# Patient Record
Sex: Female | Born: 1960 | Race: White | Hispanic: No | Smoking: Never smoker
Health system: Southern US, Community
[De-identification: ages and names within clinical notes are randomized; demographics above are authoritative.]

## PROBLEM LIST (undated history)

## (undated) DIAGNOSIS — G4733 Obstructive sleep apnea (adult) (pediatric): Secondary | ICD-10-CM

## (undated) DIAGNOSIS — I319 Disease of pericardium, unspecified: Secondary | ICD-10-CM

## (undated) DIAGNOSIS — G43909 Migraine, unspecified, not intractable, without status migrainosus: Secondary | ICD-10-CM

## (undated) DIAGNOSIS — I10 Essential (primary) hypertension: Secondary | ICD-10-CM

## (undated) DIAGNOSIS — D649 Anemia, unspecified: Secondary | ICD-10-CM

## (undated) DIAGNOSIS — Z789 Other specified health status: Secondary | ICD-10-CM

## (undated) DIAGNOSIS — K219 Gastro-esophageal reflux disease without esophagitis: Secondary | ICD-10-CM

## (undated) DIAGNOSIS — N3281 Overactive bladder: Secondary | ICD-10-CM

## (undated) DIAGNOSIS — H3320 Serous retinal detachment, unspecified eye: Secondary | ICD-10-CM

## (undated) DIAGNOSIS — G4700 Insomnia, unspecified: Secondary | ICD-10-CM

## (undated) DIAGNOSIS — C73 Malignant neoplasm of thyroid gland: Secondary | ICD-10-CM

## (undated) DIAGNOSIS — Z8489 Family history of other specified conditions: Secondary | ICD-10-CM

## (undated) DIAGNOSIS — I2510 Atherosclerotic heart disease of native coronary artery without angina pectoris: Secondary | ICD-10-CM

## (undated) DIAGNOSIS — E039 Hypothyroidism, unspecified: Secondary | ICD-10-CM

## (undated) DIAGNOSIS — J40 Bronchitis, not specified as acute or chronic: Secondary | ICD-10-CM

## (undated) DIAGNOSIS — Z923 Personal history of irradiation: Secondary | ICD-10-CM

## (undated) DIAGNOSIS — Z9221 Personal history of antineoplastic chemotherapy: Secondary | ICD-10-CM

## (undated) DIAGNOSIS — E041 Nontoxic single thyroid nodule: Principal | ICD-10-CM

## (undated) DIAGNOSIS — Z9289 Personal history of other medical treatment: Secondary | ICD-10-CM

## (undated) DIAGNOSIS — F339 Major depressive disorder, recurrent, unspecified: Secondary | ICD-10-CM

## (undated) DIAGNOSIS — Z9189 Other specified personal risk factors, not elsewhere classified: Secondary | ICD-10-CM

## (undated) DIAGNOSIS — Z85850 Personal history of malignant neoplasm of thyroid: Secondary | ICD-10-CM

## (undated) DIAGNOSIS — K2970 Gastritis, unspecified, without bleeding: Secondary | ICD-10-CM

## (undated) DIAGNOSIS — I951 Orthostatic hypotension: Secondary | ICD-10-CM

## (undated) DIAGNOSIS — C50919 Malignant neoplasm of unspecified site of unspecified female breast: Secondary | ICD-10-CM

## (undated) HISTORY — DX: Hypothyroidism, unspecified: E03.9

## (undated) HISTORY — DX: Essential (primary) hypertension: I10

## (undated) HISTORY — DX: Orthostatic hypotension: I95.1

## (undated) HISTORY — DX: Personal history of other medical treatment: Z92.89

## (undated) HISTORY — PX: FOOT SURGERY: SHX648

## (undated) HISTORY — DX: Insomnia, unspecified: G47.00

## (undated) HISTORY — DX: Serous retinal detachment, unspecified eye: H33.20

## (undated) HISTORY — DX: Personal history of malignant neoplasm of thyroid: Z85.850

## (undated) HISTORY — DX: Atherosclerotic heart disease of native coronary artery without angina pectoris: I25.10

## (undated) HISTORY — DX: Morbid (severe) obesity due to excess calories: E66.01

## (undated) HISTORY — DX: Malignant neoplasm of thyroid gland: C73

## (undated) HISTORY — DX: Gastro-esophageal reflux disease without esophagitis: K21.9

## (undated) HISTORY — DX: Malignant neoplasm of unspecified site of unspecified female breast: C50.919

## (undated) HISTORY — DX: Obstructive sleep apnea (adult) (pediatric): G47.33

## (undated) HISTORY — DX: Disease of pericardium, unspecified: I31.9

## (undated) HISTORY — DX: Major depressive disorder, recurrent, unspecified: F33.9

## (undated) HISTORY — DX: Overactive bladder: N32.81

## (undated) HISTORY — DX: Migraine, unspecified, not intractable, without status migrainosus: G43.909

## (undated) HISTORY — DX: Other specified health status: Z78.9

## (undated) HISTORY — DX: Gastritis, unspecified, without bleeding: K29.70

## (undated) HISTORY — PX: BACK SURGERY: SHX140

## (undated) HISTORY — DX: Other specified personal risk factors, not elsewhere classified: Z91.89

## (undated) HISTORY — DX: Nontoxic single thyroid nodule: E04.1

## (undated) HISTORY — PX: KNEE SURGERY: SHX244

---

## 1995-01-16 DIAGNOSIS — I319 Disease of pericardium, unspecified: Secondary | ICD-10-CM

## 1995-01-16 DIAGNOSIS — H3320 Serous retinal detachment, unspecified eye: Secondary | ICD-10-CM

## 1995-01-16 HISTORY — DX: Serous retinal detachment, unspecified eye: H33.20

## 1995-01-16 HISTORY — PX: RETINAL DETACHMENT SURGERY: SHX105

## 1995-01-16 HISTORY — DX: Disease of pericardium, unspecified: I31.9

## 2005-04-25 ENCOUNTER — Encounter: Payer: Self-pay | Admitting: Family Medicine

## 2005-05-15 ENCOUNTER — Encounter: Payer: Self-pay | Admitting: Family Medicine

## 2005-06-20 ENCOUNTER — Ambulatory Visit: Payer: Self-pay | Admitting: Neurology

## 2005-07-14 ENCOUNTER — Ambulatory Visit: Payer: Self-pay | Admitting: Neurology

## 2006-02-25 ENCOUNTER — Emergency Department: Payer: Self-pay

## 2006-12-06 ENCOUNTER — Ambulatory Visit: Payer: Self-pay | Admitting: Otolaryngology

## 2006-12-18 ENCOUNTER — Ambulatory Visit: Payer: Self-pay | Admitting: Gastroenterology

## 2006-12-19 ENCOUNTER — Emergency Department: Payer: Self-pay | Admitting: Internal Medicine

## 2008-02-13 ENCOUNTER — Ambulatory Visit: Payer: Self-pay | Admitting: Nephrology

## 2008-03-23 ENCOUNTER — Ambulatory Visit: Payer: Self-pay | Admitting: Obstetrics and Gynecology

## 2010-11-28 ENCOUNTER — Emergency Department: Payer: Self-pay | Admitting: Emergency Medicine

## 2010-12-03 ENCOUNTER — Emergency Department: Payer: Self-pay | Admitting: *Deleted

## 2014-01-15 HISTORY — PX: BREAST LUMPECTOMY: SHX2

## 2014-04-16 ENCOUNTER — Inpatient Hospital Stay
Admit: 2014-04-16 | Disposition: A | Discharge status: Home / Self Care | Payer: Self-pay | Attending: Internal Medicine | Admitting: Internal Medicine

## 2014-04-16 DIAGNOSIS — J40 Bronchitis, not specified as acute or chronic: Secondary | ICD-10-CM

## 2014-04-16 HISTORY — PX: CORONARY ANGIOPLASTY: SHX604

## 2014-04-16 HISTORY — DX: Bronchitis, not specified as acute or chronic: J40

## 2014-04-16 LAB — BASIC METABOLIC PANEL
Anion Gap: 9 (ref 7–16)
BUN: 14 mg/dL
CALCIUM: 9.2 mg/dL
CO2: 29 mmol/L
Chloride: 103 mmol/L
Creatinine: 1.13 mg/dL — ABNORMAL HIGH
EGFR (Non-African Amer.): 56 — ABNORMAL LOW
Glucose: 115 mg/dL — ABNORMAL HIGH
Potassium: 3.6 mmol/L
Sodium: 141 mmol/L

## 2014-04-16 LAB — CBC
HCT: 42.2 % (ref 35.0–47.0)
HGB: 13.3 g/dL (ref 12.0–16.0)
MCH: 26.6 pg (ref 26.0–34.0)
MCHC: 31.5 g/dL — AB (ref 32.0–36.0)
MCV: 85 fL (ref 80–100)
Platelet: 339 10*3/uL (ref 150–440)
RBC: 4.99 10*6/uL (ref 3.80–5.20)
RDW: 15.3 % — AB (ref 11.5–14.5)
WBC: 13.5 10*3/uL — ABNORMAL HIGH (ref 3.6–11.0)

## 2014-04-16 LAB — URINALYSIS, COMPLETE
Bacteria: NONE SEEN
Bilirubin,UR: NEGATIVE
Blood: NEGATIVE
GLUCOSE, UR: NEGATIVE mg/dL (ref 0–75)
Hyaline Cast: 3
Ketone: NEGATIVE
NITRITE: NEGATIVE
PROTEIN: NEGATIVE
Ph: 6 (ref 4.5–8.0)
RBC,UR: <1 /HPF (ref 0–5)
Specific Gravity: 1.015 (ref 1.003–1.030)
Squamous Epithelial: 1

## 2014-04-16 LAB — HEPATIC FUNCTION PANEL A (ARMC)
ALBUMIN: 3.9 g/dL
ALK PHOS: 88 U/L
ALT: 27 U/L
AST: 23 U/L
BILIRUBIN INDIRECT: 0.8
Bilirubin, Direct: 0.1 mg/dL
Bilirubin,Total: 0.9 mg/dL
TOTAL PROTEIN: 6.8 g/dL

## 2014-04-16 LAB — TROPONIN I
TROPONIN-I: 0.5 ng/mL — AB
TROPONIN-I: 0.25 ng/mL — AB
Troponin-I: 0.38 ng/mL — ABNORMAL HIGH

## 2014-04-16 LAB — CK-MB
CK-MB: 3.9 ng/mL
CK-MB: 2.7 ng/mL
CK-MB: 2.1 ng/mL

## 2014-04-17 LAB — BASIC METABOLIC PANEL
Anion Gap: 3 — ABNORMAL LOW (ref 7–16)
BUN: 14 mg/dL
CALCIUM: 8.4 mg/dL — AB
CO2: 30 mmol/L
Chloride: 107 mmol/L
Creatinine: 0.92 mg/dL
Glucose: 88 mg/dL
POTASSIUM: 4.1 mmol/L
SODIUM: 140 mmol/L

## 2014-04-17 LAB — LIPID PANEL
CHOLESTEROL: 132 mg/dL
HDL: 37 mg/dL — AB
LDL CHOLESTEROL, CALC: 72 mg/dL
TRIGLYCERIDES: 115 mg/dL
VLDL Cholesterol, Calc: 23 mg/dL

## 2014-04-17 LAB — CBC WITH DIFFERENTIAL/PLATELET
Basophil #: 0.1 10*3/uL (ref 0.0–0.1)
Basophil %: 0.6 %
Eosinophil #: 0.2 10*3/uL (ref 0.0–0.7)
Eosinophil %: 1.9 %
HCT: 36.1 % (ref 35.0–47.0)
HGB: 11.7 g/dL — ABNORMAL LOW (ref 12.0–16.0)
LYMPHS ABS: 3.7 10*3/uL — AB (ref 1.0–3.6)
Lymphocyte %: 32.3 %
MCH: 27.5 pg (ref 26.0–34.0)
MCHC: 32.5 g/dL (ref 32.0–36.0)
MCV: 85 fL (ref 80–100)
MONO ABS: 0.8 x10 3/mm (ref 0.2–0.9)
Monocyte %: 7.5 %
NEUTROS PCT: 57.7 %
Neutrophil #: 6.5 10*3/uL (ref 1.4–6.5)
Platelet: 257 10*3/uL (ref 150–440)
RBC: 4.26 10*6/uL (ref 3.80–5.20)
RDW: 15.1 % — ABNORMAL HIGH (ref 11.5–14.5)
WBC: 11.3 10*3/uL — ABNORMAL HIGH (ref 3.6–11.0)

## 2014-04-17 LAB — HEMOGLOBIN A1C: HEMOGLOBIN A1C: 5.6 %

## 2014-04-19 LAB — CBC
HCT: 37.6 % (ref 35.0–47.0)
HGB: 12.1 g/dL (ref 12.0–16.0)
MCH: 27.5 pg (ref 26.0–34.0)
MCHC: 32.3 g/dL (ref 32.0–36.0)
MCV: 85 fL (ref 80–100)
PLATELETS: 250 10*3/uL (ref 150–440)
RBC: 4.41 10*6/uL (ref 3.80–5.20)
RDW: 15.7 % — AB (ref 11.5–14.5)
WBC: 9 10*3/uL (ref 3.6–11.0)

## 2014-04-19 LAB — BASIC METABOLIC PANEL
Anion Gap: 7 (ref 7–16)
BUN: 12 mg/dL
CALCIUM: 8.9 mg/dL
CHLORIDE: 105 mmol/L
CREATININE: 0.93 mg/dL
Co2: 31 mmol/L
EGFR (African American): >60
Glucose: 94 mg/dL
Potassium: 4.5 mmol/L
Sodium: 143 mmol/L

## 2014-04-21 LAB — CULTURE, BLOOD (SINGLE)

## 2014-04-26 DIAGNOSIS — F339 Major depressive disorder, recurrent, unspecified: Secondary | ICD-10-CM | POA: Insufficient documentation

## 2014-04-26 DIAGNOSIS — R51 Headache: Secondary | ICD-10-CM

## 2014-04-26 DIAGNOSIS — K219 Gastro-esophageal reflux disease without esophagitis: Secondary | ICD-10-CM | POA: Insufficient documentation

## 2014-04-26 DIAGNOSIS — I10 Essential (primary) hypertension: Secondary | ICD-10-CM | POA: Insufficient documentation

## 2014-04-26 DIAGNOSIS — G4733 Obstructive sleep apnea (adult) (pediatric): Secondary | ICD-10-CM | POA: Insufficient documentation

## 2014-04-26 DIAGNOSIS — G8929 Other chronic pain: Secondary | ICD-10-CM | POA: Insufficient documentation

## 2014-04-26 DIAGNOSIS — E039 Hypothyroidism, unspecified: Secondary | ICD-10-CM | POA: Insufficient documentation

## 2014-04-26 DIAGNOSIS — N3281 Overactive bladder: Secondary | ICD-10-CM | POA: Insufficient documentation

## 2014-05-16 NOTE — Consult Note (Signed)
PATIENT NAME:  Robin Howard, Robin Howard MR#:  [id number] DATE OF BIRTH:  1960/06/29  DATE OF CONSULTATION:  04/17/2014  REFERRING PHYSICIAN:   CONSULTING PHYSICIAN:  Dionisio David, MD  INDICATION FOR CONSULTATION: Elevated troponin and chest pain.   HISTORY OF PRESENT ILLNESS: This is a 54 year old white female with a past medical history of hypothyroidism, hypertension, who presented to the Emergency Room with chest pain, radiating to both arms and associated with tingling sensation. She had elevated troponin; thus, I was asked to evaluate the patient. The patient also has a history of having bronchitis-type of symptoms for the past 2 weeks, with cough productive of brown-colored sputum.   PAST MEDICAL HISTORY: History of hypothyroidism, depression, hypertension, history of obstructive sleep apnea, history of pericarditis in 2007.   SOCIAL HISTORY:  She denies EtOH abuse or smoking.   FAMILY HISTORY: Her father had CHF and ventricular tachycardia. Mother had also, coronary artery disease.   PHYSICAL EXAMINATION: GENERAL: She is alert, oriented x 3, in no acute distress, except for occasional coughing.  VITAL SIGNS: Her temperature is 98.3, pulse 56, respirations 18, blood pressure 139/83, saturation 99. Monitor shows currently, she is in sinus rhythm at a rate of 63 beats per minute.  NECK: No JVD.  LUNGS: Good air entry. No rales or rhonchi.  HEART: Regular rate and rhythm. Normal S1, S2. No audible murmur.  ABDOMEN: Soft, nontender, positive bowel sounds.  EXTREMITIES: No pedal edema.  NEUROLOGIC: She appears to be intact.   LABORATORY DATA: EKG shows normal sinus rhythm, 72 beats per minute, nonspecific ST and T changes in the inferior and lateral leads. Her troponins, initial one was 0.5, then it became 0.25, is trending down. BUN and creatinine 14/1.13, glucose 115.   ASSESSMENT AND PLAN: The patient may be having acute coronary syndrome with non- ST segment elevation myocardial infarction,  with risk factors for coronary artery disease, nonspecific ST and T changes on EKG, mildly elevated troponin, advise cardiac catheterization.   Thank you for very much for the referral     ____________________________ Dionisio David, MD sak:mw D: 04/17/2014 09:55:14 ET T: 04/17/2014 10:03:31 ET JOB#: [id number]  cc: Dionisio David, MD, <Dictator> Dionisio David MD ELECTRONICALLY SIGNED 04/27/2014 13:33

## 2014-05-16 NOTE — H&P (Signed)
PATIENT NAME:  Robin Howard, Robin Howard MR#:  [id number] DATE OF BIRTH:  1961/01/07  DATE OF ADMISSION:  04/16/2014  REFERRING EMERGENCY ROOM PHYSICIAN: Dr. Lisa Roca   PRIMARY CARE PHYSICIAN:  Dr. Jeananne Rama   REASON FOR PRESENTATION:  Cough, congestion, and chest pain.   HISTORY OF PRESENT ILLNESS: This very pleasant 54 year old woman with past medical history of hypothyroidism and hypertension presents today with 2 weeks of dry cough, nausea, dizziness, fatigue and new sensation of chest pain involving both arms. She reports that she has had a cough for about 2 weeks. She went to her primary care physician soon after onset and was started on a Z-Pak and prednisone. Symptoms have not improved since that time. She has a barky loud cough with no sputum production. Coughs a lot after eating and when she lays down. She has had nausea with no vomiting. She has had decreased p.o. intake. She is dizzy when she stands up and has a constant headache. For the past 2 nights when she has gotten up to use the bathroom, she has had noted a burning sensation across her chest and both arms. This occurred again this morning, which prompted her to come to the Emergency Room for further evaluation. She has had some wheezing and shortness of breath with any exertion. No measured fevers at home. On presentation to the Emergency Room, her chest x-ray is clear, her respiratory status is stable but her troponin is found to be elevated at 0.5.  Hospitalist services are asked to admit for non-ST elevation myocardial infarction.   PAST MEDICAL HISTORY: 1. Hypothyroidism.  2. Depression.  3. Hypertension.  4. Obstructive sleep apnea not using CPAP due to financial reasons.  5. History of pericarditis in 2007, etiology of this is unclear.   SOCIAL HISTORY: The patient lives alone. She does not smoke cigarettes, drink alcohol, or use illicit substances. She works at [employer].   FAMILY MEDICAL HISTORY: Positive for coronary artery  disease in her mother and her father. Her father also had congestive heart failure and ventricular tachycardia. No family history of diabetes. Her father also had liposarcoma.   REVIEW OF SYSTEMS:   GENERAL: Positive for fatigue, weakness, subjective chills and fevers. No change in weight.  HEENT: No change in vision or hearing. No pain in the eyes.  She has had some sharp pains in both ears over the past few weeks. No sore throat or difficulty swallowing. She has had some sinus congestion, which is resolving.  RESPIRATORY: Positive for cough. No sputum production. No hemoptysis, wheezing, shortness of breath with exertion. No pleuritic type chest pain.  CARDIOVASCULAR: Positive for chest pain and decreased exercise tolerance. No orthopnea or edema.  Positive for presyncopal feeling an orthostatic type symptoms.  GASTROINTESTINAL: Positive for nausea, decreased intake. No vomiting, diarrhea, or abdominal pain.  MUSCULOSKELETAL: No new pain in the neck, back, shoulders, knees, or hips. No swollen tender joints. No gout.  SKIN: No new rashes, lesions or open wounds.  ENDOCRINE: No polyuria, polydipsia, or hot or cold intolerance.  HEMATOLOGIC: No easy bruising or bleeding. No swollen glands.  NEUROLOGIC: No focal numbness or weakness. No seizure or history of cerebrovascular accident. No confusion. She does have a headache and has a history of cluster headaches.  PSYCHIATRIC: No psychosis, bipolar disorder, or schizophrenia. She does have a history of depression.   HOME MEDICATIONS: 1. Synthroid 100 mcg 1 tablet daily.  2. Citalopram 40 mg 1 tablet daily.  3. Atenolol 50 mg  1 tablet once a day.   ALLERGIES: ULTRAM AND REGLAN WITH UNKNOWN REACTIONS.   PHYSICAL EXAMINATION: VITAL SIGNS: Temperature 98.8, pulse 89, respirations 20, blood pressure is 111/77, oxygenation 100% on room air.  GENERAL: The patient appears fatigued and uncomfortable.  HEENT: Pupils equal, round, and reactive to light.  Conjunctivae are clear. Extraocular motion intact. Oral mucous membranes are dry. Fair dentition. Posterior oropharynx is clear with no exudate, edema, or erythema. Trachea is midline. No cervical lymphadenopathy.   NECK: Obese.  RESPIRATORY: Lungs are clear to auscultation bilaterally with good air movement. No wheezing or rhonchi. She does have a loud dry cough.  CARDIOVASCULAR: Regular rate and rhythm. No murmurs, rubs, or gallops. No peripheral edema. Peripheral pulses 2+.  NECK:   Difficult to assess for JVD  as her neck is obese. No carotid bruits.  ABDOMEN: Soft, nontender, nondistended. No guarding, no rebound. Bowel sounds are normal. No hepatosplenomegaly or mass noted.  SKIN: No rashes, lesions, or open wounds.  MUSCULOSKELETAL: Range of motion is normal. Strength is 5 out of 5 throughout.  NEUROLOGIC: Cranial nerves II-XII grossly intact. Strength and sensation intact. Nonfocal.  PSYCHIATRIC: She seems depressed or fatigued at this time. She is alert and oriented with good insight into her clinical conditions.   LABORATORY DATA: Sodium 140, potassium 3.6, chloride 103, bicarbonate 29, BUN 14, creatinine 1.13, glucose 115, CK-MB 3.9. Troponin 0.5. White blood cells 13.5, hemoglobin 13.3, platelets 339,000, MCV is 85.   IMAGING: Chest x-ray shows a borderline enlargement of the heart. No edema or consolidation.   ASSESSMENT AND PLAN: 1. Non-ST elevation myocardial infarction.  She has elevated troponin at 0.5. Her EKG shows normal sinus rhythm with nonspecific T wave abnormalities, flipped T waves, flattened T waves in a lateral leads. We will continue to cycle cardiac enzymes and repeat the EKG.  She has been started on aspirin and Lovenox in the Emergency Room and I will continue this. We will start a statin and nitroglycerin p.r.n. will not start a beta blocker at this time as she is slightly hypotensive and bradycardic. A 2-D echocardiogram is ordered. Cardiology consult is ordered.  She is being admitted to 2A.  2. Bronchitis: Chest x-ray is clear. She is in no respiratory distress and oxygen saturations are good. She is not wheezing. We will start nebulizer treatments and Rocephin. We will obtain blood cultures prior to starting antibiotics.  3. Hypotension, she does have orthostatic hypotension; when sitting up, blood pressure drops to 86/40. We will start IV fluids. I suspect that she is depleted due to decreased p.o. intake over the past few days.  4. Acute kidney injury: I do not know her baseline creatinine. I suspect that she has acute tubular necrosis due to hypotension and hypovolemia. We will start IV fluids. We will check a urinalysis. Continue to monitor.  5. Hypertension: She is not hypertensive at this time and I am holding her atenolol due to decreased blood pressure and heart rate.  6. Depression: Continue with citalopram.  7. Hypothyroidism: Check a TSH and continue thyroid replacement. 8. Prophylaxis: Lovenox for treatment of non-ST elevation myocardial infarction.  No gastrointestinal prophylaxis at this time.   CODE STATUS:  PATIENT IS A FULL CODE.   TIME SPENT ON ADMISSION:  45 minutes.    ____________________________ Earleen Newport. Volanda Napoleon, MD cpw:tr D: 04/16/2014 16:26:04 ET T: 04/16/2014 16:46:00 ET JOB#: [id number]  cc: Barnetta Chapel P. Volanda Napoleon, MD, <Dictator> Aldean Jewett MD ELECTRONICALLY SIGNED 04/24/2014 14:38

## 2014-05-16 NOTE — Discharge Summary (Signed)
PATIENT NAMEANTONIQUE, Howard MR#:  [id number] DATE OF BIRTH:  07/03/1960  DATE OF ADMISSION:  04/16/2014 DATE OF DISCHARGE:  04/19/2014  ADMITTING DIAGNOSIS: Chest pain.   DISCHARGING DIAGNOSES:  1. Chest pain.  2. Elevated troponin.  3. Unlikely acute coronary syndrome, likely demand ischemia, status post cardiac catheterization on 04/19/2014 by Dr. Neoma Laming. No coronary artery disease noted, normal ejection fraction.  4. Bronchitis.  5. Acute gastroenteritis, resolving.  6. Bradycardia due to beta blockers.  7. History of hypertension, obstructive sleep apnea, obesity, depression, hypothyroidism.   DISCHARGE CONDITION: Stable.   DISCHARGE MEDICATIONS: The patient is to continue Synthroid 100 mcg p.o. daily, citalopram 40 mg p.o. daily, erythromycin 250 mg p.o. daily for 2 more days, omeprazole 40 mg p.o. daily, Tessalon Perles 100 mg 3 times daily as needed. The patient is not to take atenolol.   HOME OXYGEN: None.   DIET: Two-gram salt, low-fat, low-cholesterol, mechanical soft.   ACTIVITY LIMITATIONS: As tolerated.   FOLLOWUP APPOINTMENTS: Dr. Jeananne Rama in 2 days after discharge, Dr. Neoma Laming in 1 week after discharge.  CONSULTANTS: Care management, social work, Dr. Neoma Laming.   RADIOLOGIC STUDIES: Chest x-ray, portable single view, 04/16/2014, showed heart borderline enlarged, no edema or consolidation. Echocardiogram, 04/16/2014, showed left ventricular ejection fraction by visual estimation 55% to 60%, normal wall motion, elevated mean left atrial pressure, impaired relaxation pattern of left ventricular diastolic filling, normal right ventricular size and systolic function. The left atrium was normal in size and structure. The right atrium was normal in size and structure. Pericardial effusions were noted, globally, trivial pericardial effusion, pulmonic valve was noted to be trileaflet without evidence of stenosis or insufficiency. Cardiac catheterization, 04/19/2014,  showing global left ventricular function normal, ejection fraction calculated by contract ventriculography 60%, normal coronary arteries with normal ejection fraction.   HOSPITAL COURSE: The patient is [name] with past medical history significant for history of hypertension, hypothyroidism, history of obstructive sleep apnea, who presented to the hospital with complaints of cough, congestion, as well as chest pain. Please refer to Dr. Loistine Chance admission note on 04/16/2014. On arrival to the hospital, the patient was complaining of subjective chills as well as fevers. In ER her temperature was 98.8, pulse was 89, respiration rate was 20, blood pressure 111/77, saturation was 100% on room air. Physical exam was unremarkable. The patient's EKG done on admission showed normal sinus rhythm, ST, T changes in inferior as well as  anterolateral leads.   LABORATORY DATA: Done on arrival to the hospital, showed elevated creatinine of 1.13, glucose 115, otherwise BMP was unremarkable. The patient's calcium level was 9.2. Liver enzymes were normal. Cardiac enzymes showed troponin elevation at 0.5 on the 1st set, 0.38 on the 2nd, and 0.25 on the 3rd set. MB fraction was within normal limits. White blood cell count was elevated to 13.5, hemoglobin was 13.3, platelet count was 339,000. Blood cultures taken on 04/16/2014, showed no growth. Urinalysis was remarkable for 1+ leukocyte esterase, less than 1 red blood cells, 5 white blood cells, no bacteria, 1 epithelial cell, mucus was present as well as 3 hyaline casts. Otherwise, negative for blood, protein, and nitrites. The patient's chest x-ray done on arrival to the Emergency Room, single view, showed borderline enlargement of the heart, but otherwise no abnormalities were found. The patient was admitted to the hospital for further evaluation. Her cardiac enzymes were cycled and consultation with Dr. Neoma Laming was obtained. Dr. Humphrey Rolls felt that the  patient  would benefit from cardiac catheterization, and he proceeded to cardiac catheterization on 04/19/2014, which was unremarkable with normal ejection fraction. It was felt that the patient's chest pain, as well as mild elevation of troponin could have been related to demand ischemia, but not acute coronary syndrome. The patient was advised to continue her therapy for bronchitis with erythromycin at 250 mg p.o. daily dose for 2 more days to complete course. She also is to continue [employer] for cough. The patient was complaining of some intermittent nausea as well as pain and discomfort in her abdomen, as well as prior recent diarrhea and epigastric discomfort. The patient was advised to continue omeprazole and follow up with her primary care physician for further recommendations. The patient was noted to be bradycardic with beta blockers and atenolol was stopped. She was rehydrated and her kidney function normalized.   For her chronic medical problems, the patient is to continue her outpatient management.   On the day of discharge, felt somewhat a little bit more comfortable; however, still complaining of some chest discomfort, and she was advised to continue follow up with her primary care physician.   VITAL SIGNS: On the day of discharge: Temperature was 98.1, pulse was 66, respirations were 18, blood pressure 121/77, saturation 96% to 98% on room air at rest.   TIME SPENT: 40 minutes.     ____________________________ Theodoro Grist, MD rv:mw D: 04/19/2014 16:05:46 ET T: 04/19/2014 19:29:06 ET JOB#: [id number]  cc: Theodoro Grist, MD, <Dictator> Dionisio David, MD Unknown cc   Mariafernanda Hendricksen MD ELECTRONICALLY SIGNED 04/25/2014 16:09

## 2014-06-16 ENCOUNTER — Telehealth: Payer: Self-pay | Admitting: Family Medicine

## 2014-06-22 ENCOUNTER — Telehealth: Payer: Self-pay

## 2014-06-22 MED ORDER — OMEPRAZOLE 40 MG PO CPDR: 40.0000 mg | DELAYED_RELEASE_CAPSULE | Freq: Every day | ORAL | Status: DC

## 2014-06-22 NOTE — Telephone Encounter (Signed)
Requesting a refill on Omeprazole 40mg 

## 2014-07-02 ENCOUNTER — Other Ambulatory Visit: Payer: Self-pay | Admitting: Family Medicine

## 2014-07-15 ENCOUNTER — Encounter: Payer: Self-pay | Admitting: Family Medicine

## 2014-07-15 ENCOUNTER — Ambulatory Visit (INDEPENDENT_AMBULATORY_CARE_PROVIDER_SITE_OTHER): Payer: Medicaid Other | Admitting: Family Medicine

## 2014-07-15 VITALS — BP 146/99 | HR 80 | Temp 99.4°F | Wt 240.0 lb

## 2014-07-15 DIAGNOSIS — E669 Obesity, unspecified: Secondary | ICD-10-CM

## 2014-07-15 DIAGNOSIS — R5383 Other fatigue: Secondary | ICD-10-CM | POA: Insufficient documentation

## 2014-07-15 DIAGNOSIS — R5382 Chronic fatigue, unspecified: Secondary | ICD-10-CM

## 2014-07-15 DIAGNOSIS — K219 Gastro-esophageal reflux disease without esophagitis: Secondary | ICD-10-CM

## 2014-07-15 DIAGNOSIS — F331 Major depressive disorder, recurrent, moderate: Secondary | ICD-10-CM

## 2014-07-15 DIAGNOSIS — Z1331 Encounter for screening for depression: Secondary | ICD-10-CM

## 2014-07-15 DIAGNOSIS — G4733 Obstructive sleep apnea (adult) (pediatric): Secondary | ICD-10-CM

## 2014-07-15 DIAGNOSIS — Z1389 Encounter for screening for other disorder: Secondary | ICD-10-CM

## 2014-07-15 DIAGNOSIS — G43709 Chronic migraine without aura, not intractable, without status migrainosus: Secondary | ICD-10-CM

## 2014-07-15 DIAGNOSIS — G43909 Migraine, unspecified, not intractable, without status migrainosus: Secondary | ICD-10-CM | POA: Insufficient documentation

## 2014-07-15 DIAGNOSIS — E039 Hypothyroidism, unspecified: Secondary | ICD-10-CM

## 2014-07-15 DIAGNOSIS — I10 Essential (primary) hypertension: Secondary | ICD-10-CM

## 2014-07-15 MED ORDER — ESCITALOPRAM OXALATE 20 MG PO TABS: 20.0000 mg | ORAL_TABLET | Freq: Every day | ORAL | Status: DC

## 2014-07-15 NOTE — Assessment & Plan Note (Signed)
Not quite to goal, but she is having headache today

## 2014-07-15 NOTE — Assessment & Plan Note (Signed)
Really stressed the need to get back on CPAP; offered referral for sleep study

## 2014-07-15 NOTE — Patient Instructions (Addendum)
Do contact [facility] to see if you can get established Do not take triptans for your migraines Let's get labs today and see if we need to adjust your thyroid medicine Take 1,000 iu of over-the-counter vitamin D3 and 1000 mcg of over-the-counter vitaming B12 I would recommend that you start a walking program, even if you just walk 5-7 minutes at a time to start with four days a week, and then gradually increase to 7-10 minutes and then to 10-12 minutes, etc. I really do recommend the CPAP machine because that will help your fatigue, mood, blood pressure Check GoodRx and bring in the list with you or see if Inderal, verapamil, cardizem, or other medicines are listed Stop the citalopram and start escitalopram   Migraine Headache A migraine headache is very bad, throbbing pain on one or both sides of your head. Talk to your doctor about what things may bring on (trigger) your migraine headaches. HOME CARE  Only take medicines as told by your doctor.  Lie down in a dark, quiet room when you have a migraine.  Keep a journal to find out if certain things bring on migraine headaches. For example, write down:  What you eat and drink.  How much sleep you get.  Any change to your diet or medicines.  Lessen how much alcohol you drink.  Quit smoking if you smoke.  Get enough sleep.  Lessen any stress in your life.  Keep lights dim if bright lights bother you or make your migraines worse. GET HELP RIGHT AWAY IF:   Your migraine becomes really bad.  You have a fever.  You have a stiff neck.  You have trouble seeing.  Your muscles are weak, or you lose muscle control.  You lose your balance or have trouble walking.  You feel like you will pass out (faint), or you pass out.  You have really bad symptoms that are different than your first symptoms. MAKE SURE YOU:   Understand these instructions.  Will watch your condition.  Will get help right away if you are not doing  well or get worse. Document Released: 10/11/2007 Document Revised: 03/26/2011 Document Reviewed: 09/08/2012 [facility] Patient Information 2015 [place], [place]. This information is not intended to replace advice given to you by your health care provider. Make sure you discuss any questions you have with your health care provider. DASH Eating Plan DASH stands for "Dietary Approaches to Stop Hypertension." The DASH eating plan is a healthy eating plan that has been shown to reduce high blood pressure (hypertension). Additional health benefits may include reducing the risk of type 2 diabetes mellitus, heart disease, and stroke. The DASH eating plan may also help with weight loss. WHAT DO I NEED TO KNOW ABOUT THE DASH EATING PLAN? For the DASH eating plan, you will follow these general guidelines:  Choose foods with a percent daily value for sodium of less than 5% (as listed on the food label).  Use salt-free seasonings or herbs instead of table salt or sea salt.  Check with your health care provider or pharmacist before using salt substitutes.  Eat lower-sodium products, often labeled as "lower sodium" or "no salt added."  Eat fresh foods.  Eat more vegetables, fruits, and low-fat dairy products.  Choose whole grains. Look for the word "whole" as the first word in the ingredient list.  Choose fish and skinless chicken or [place] more often than red meat. Limit fish, poultry, and meat to 6 oz (170 g) each  day.  Limit sweets, desserts, sugars, and sugary drinks.  Choose heart-healthy fats.  Limit cheese to 1 oz (28 g) per day.  Eat more home-cooked food and less restaurant, buffet, and fast food.  Limit fried foods.  Cook foods using methods other than frying.  Limit canned vegetables. If you do use them, rinse them well to decrease the sodium.  When eating at a restaurant, ask that your food be prepared with less salt, or no salt if possible. WHAT FOODS CAN I EAT? Seek help from a  dietitian for individual calorie needs. Grains Whole grain or whole wheat bread. Brown rice. Whole grain or whole wheat pasta. Quinoa, bulgur, and whole grain cereals. Low-sodium cereals. Corn or whole wheat flour tortillas. Whole grain cornbread. Whole grain crackers. Low-sodium crackers. Vegetables Fresh or frozen vegetables (raw, steamed, roasted, or grilled). Low-sodium or reduced-sodium tomato and vegetable juices. Low-sodium or reduced-sodium tomato sauce and paste. Low-sodium or reduced-sodium canned vegetables.  Fruits All fresh, canned (in natural juice), or frozen fruits. Meat and Other Protein Products Ground beef (85% or leaner), grass-fed beef, or beef trimmed of fat. Skinless chicken or [place]. Ground chicken or [place]. Pork trimmed of fat. All fish and seafood. Eggs. Dried beans, peas, or lentils. Unsalted nuts and seeds. Unsalted canned beans. Dairy Low-fat dairy products, such as skim or 1% milk, 2% or reduced-fat cheeses, low-fat ricotta or cottage cheese, or plain low-fat yogurt. Low-sodium or reduced-sodium cheeses. Fats and Oils Tub margarines without trans fats. Light or reduced-fat mayonnaise and salad dressings (reduced sodium). Avocado. Safflower, olive, or canola oils. Natural peanut or almond butter. Other Unsalted popcorn and pretzels. The items listed above may not be a complete list of recommended foods or beverages. Contact your dietitian for more options. WHAT FOODS ARE NOT RECOMMENDED? Grains White bread. White pasta. White rice. Refined cornbread. Bagels and croissants. Crackers that contain trans fat. Vegetables Creamed or fried vegetables. Vegetables in a cheese sauce. Regular canned vegetables. Regular canned tomato sauce and paste. Regular tomato and vegetable juices. Fruits Dried fruits. Canned fruit in light or heavy syrup. Fruit juice. Meat and Other Protein Products Fatty cuts of meat. Ribs, chicken wings, bacon, sausage, bologna, salami,  chitterlings, fatback, hot dogs, bratwurst, and packaged luncheon meats. Salted nuts and seeds. Canned beans with salt. Dairy Whole or 2% milk, cream, half-and-half, and cream cheese. Whole-fat or sweetened yogurt. Full-fat cheeses or blue cheese. Nondairy creamers and whipped toppings. Processed cheese, cheese spreads, or cheese curds. Condiments Onion and garlic salt, seasoned salt, table salt, and sea salt. Canned and packaged gravies. Worcestershire sauce. Tartar sauce. Barbecue sauce. Teriyaki sauce. Soy sauce, including reduced sodium. Steak sauce. Fish sauce. Oyster sauce. Cocktail sauce. Horseradish. Ketchup and mustard. Meat flavorings and tenderizers. Bouillon cubes. Hot sauce. Tabasco sauce. Marinades. Taco seasonings. Relishes. Fats and Oils Butter, stick margarine, lard, shortening, ghee, and bacon fat. Coconut, palm kernel, or palm oils. Regular salad dressings. Other Pickles and olives. Salted popcorn and pretzels. The items listed above may not be a complete list of foods and beverages to avoid. Contact your dietitian for more information. WHERE CAN I FIND MORE INFORMATION? National Heart, Lung, and Blood Institute: travelstabloid.com Document Released: 12/21/2010 Document Revised: 05/18/2013 Document Reviewed: 11/05/2012 [facility] Patient Information 2015 [place], [place]. This information is not intended to replace advice given to you by your health care provider. Make sure you discuss any questions you have with your health care provider.

## 2014-07-15 NOTE — Progress Notes (Signed)
BP 146/99 mmHg  Pulse 80  Temp(Src) 99.4 F (37.4 C)  Wt 240 lb (108.863 kg)  SpO2 98%   Subjective:    Patient ID: Robin Howard, female    DOB: [birth year], 54 y.o.   MRN: [id number]  HPI: Robin Howard is a 54 y.o. female  Chief Complaint  Patient presents with  . Depression  . Hypothyroidism  . Headache   Since the last time I saw her, she had a heart attack; they stopped her beta-blocker at discharge; she had a cardiac catheterization; left hospital April 6th; she saw Dr. Humphrey Rolls cardiologist in the hospital, but she did not go back for her follow-up with him after her hospital stay; she says Dr. Wynetta Emery told her she didn't need to go back to see the cardiologist; she never got the statin filled; no chest pain; having some indigestion and taking Prilosec; she has had heartburn for years, had endoscopy and was on PPI, got half of it filled; she again denies chest pain; no acid; soreness in the midchest  She says her head started to hurt yesterday; no aura; gets them two times a week; used to go to [facility] and then to [facility]; taking excedrin migraine per package directions; she doesn't have insurance she says when I offered referral back to headache specialist; she has learned to live with them; this is a typical migraine, gets tingling in the hand occasionally, not new; does have nausea at times, but not so nauseated today to need phenergan she says; they were trying all different medicines; tried topamax, did not really help; perfumes and strong odors are triggers  High blood pressure; she has been limiting salt since her heart attack; trying healthier eating habits but not seeing beneifts with weight loss; not many saturated fats; takes plain benadryl for sinuses, that's okay I told her; knows to stay away from decongestants; has OSA  Hypothyroidism; not surgery or radiation; just fatigue; not better on the thyroid medicine; she does not want to get up  in the morning; she feels like she is going to get up; she has sleep apnea and is not wearing her CPAP  She does not have insurance; she applied for Obamacare but couldn't afford the monthly payment so never actually entered the contract; she worked with someone at hospital, got declined Medicaid in April  Relevant past medical, surgical, family and social history reviewed and updated as indicated. Interim medical history since our last visit reviewed. Allergies and medications reviewed and updated.  PHQ score: Depression screen [facility] 2/9 07/15/2014  Decreased Interest 3  Down, Depressed, Hopeless 1  PHQ - 2 Score 4  Altered sleeping 3  Tired, decreased energy 3  Change in appetite 1  Feeling bad or failure about yourself  1  Trouble concentrating 2  Moving slowly or fidgety/restless 1  Suicidal thoughts 1  PHQ-9 Score 16   Review of Systems Per HPI unless specifically indicated above     Objective:    BP 146/99 mmHg  Pulse 80  Temp(Src) 99.4 F (37.4 C)  Wt 240 lb (108.863 kg)  SpO2 98%  Wt Readings from Last 3 Encounters:  07/15/14 240 lb (108.863 kg)  04/26/14 244 lb (110.678 kg)    Physical Exam  Constitutional: She appears well-developed and well-nourished. No distress.  obese  HENT:  Head: Normocephalic and atraumatic.  Eyes: EOM are normal. No scleral icterus.  Neck: No thyromegaly present.  Cardiovascular: Normal rate,  regular rhythm and normal heart sounds.   No murmur heard. Pulmonary/Chest: Effort normal and breath sounds normal. No respiratory distress. She has no wheezes.  Abdominal: Soft. Bowel sounds are normal. She exhibits no distension.  Musculoskeletal: Normal range of motion. She exhibits no edema.  Neurological: She is alert. She exhibits normal muscle tone.  Skin: Skin is warm and dry. She is not diaphoretic. No pallor.  Psychiatric: She has a normal mood and affect. Her behavior is normal. Judgment and thought content normal.       Assessment  & Plan:   Problem List Items Addressed This Visit      Cardiovascular and Mediastinum   HTN (hypertension)    Not quite to goal, but she is having headache today      Migraine - Primary    No triptans; okay to use excedrin migraine per package directs      Relevant Medications   escitalopram (LEXAPRO) 20 MG tablet     Respiratory   OSA (obstructive sleep apnea)    Really stressed the need to get back on CPAP; offered referral for sleep study      Relevant Orders   Nocturnal polysomnography (NPSG)     Digestive   GERD (gastroesophageal reflux disease)     Endocrine   Hypothyroidism    Reviewed hospital records; did not see a TSH drawn during her hospital stay; weight is not coming off despite healthy diet changes; check today and adjust if needed        Other   Major depressive disorder, recurrent    Stop citalopram and start escitalopram; recheck in 4 weeks; call sooner if hypomanic symptoms      Relevant Medications   escitalopram (LEXAPRO) 20 MG tablet   Fatigue    Other Visit Diagnoses    Depression screening        PHQ 9 score was 12    Obesity            Follow up plan: No Follow-up on file.

## 2014-07-15 NOTE — Assessment & Plan Note (Signed)
No triptans; okay to use excedrin migraine per package directs

## 2014-07-15 NOTE — Assessment & Plan Note (Signed)
Reviewed hospital records; did not see a TSH drawn during her hospital stay; weight is not coming off despite healthy diet changes; check today and adjust if needed

## 2014-07-15 NOTE — Assessment & Plan Note (Signed)
Stop citalopram and start escitalopram; recheck in 4 weeks; call sooner if hypomanic symptoms

## 2014-07-16 DIAGNOSIS — C50919 Malignant neoplasm of unspecified site of unspecified female breast: Secondary | ICD-10-CM

## 2014-07-16 HISTORY — PX: BREAST BIOPSY: SHX20

## 2014-07-16 HISTORY — DX: Malignant neoplasm of unspecified site of unspecified female breast: C50.919

## 2014-07-16 LAB — TSH: TSH: 1.92 u[IU]/mL (ref 0.450–4.500)

## 2014-07-22 ENCOUNTER — Ambulatory Visit (INDEPENDENT_AMBULATORY_CARE_PROVIDER_SITE_OTHER): Payer: Medicaid Other

## 2014-07-22 VITALS — BP 136/84 | HR 80

## 2014-07-22 DIAGNOSIS — I10 Essential (primary) hypertension: Secondary | ICD-10-CM

## 2014-07-22 NOTE — Progress Notes (Signed)
Patient came in for BP check. She is doing much better. BP today is 136/84.

## 2014-07-28 ENCOUNTER — Ambulatory Visit: Payer: Self-pay | Attending: Oncology

## 2014-07-28 ENCOUNTER — Ambulatory Visit
Admission: RE | Admit: 2014-07-28 | Discharge: 2014-07-28 | Disposition: A | Discharge status: Home / Self Care | Payer: Self-pay | Source: Ambulatory Visit | Attending: Oncology | Admitting: Oncology

## 2014-07-28 ENCOUNTER — Other Ambulatory Visit: Payer: Self-pay

## 2014-07-28 ENCOUNTER — Other Ambulatory Visit: Payer: Self-pay | Admitting: Oncology

## 2014-07-28 VITALS — BP 130/89 | HR 102 | Temp 96.6°F | Resp 20 | Ht 65.35 in | Wt 239.9 lb

## 2014-07-28 DIAGNOSIS — N63 Unspecified lump in unspecified breast: Secondary | ICD-10-CM

## 2014-07-28 DIAGNOSIS — Z0000 Encounter for general adult medical examination without abnormal findings: Secondary | ICD-10-CM

## 2014-07-28 NOTE — Progress Notes (Addendum)
Subjective:     Patient ID: Robin Howard, female   DOB: 11/27/1960, 54 y.o.   MRN: [id number]  HPI   Review of Systems     Objective:   Physical Exam  Pulmonary/Chest: Right breast exhibits no inverted nipple, no mass, no nipple discharge, no skin change and no tenderness. Left breast exhibits no inverted nipple, no mass, no nipple discharge, no skin change and no tenderness. Breasts are symmetrical.  Genitourinary: No labial fusion. There is no rash, tenderness, lesion or injury on the right labia. There is no rash, tenderness, lesion or injury on the left labia. Uterus is not deviated, not enlarged, not fixed and not tender. Cervix exhibits no motion tenderness, no discharge and no friability. Right adnexum displays no mass, no tenderness and no fullness. Left adnexum displays no mass, no tenderness and no fullness. No erythema, tenderness or bleeding in the vagina. No foreign body around the vagina. No signs of injury around the vagina. No vaginal discharge found.       Assessment:     54 year old patient presents for [facility] clinic visitPatient screened, and meets B[facility]CP eligibility.  Patient does not have insurance, Medicare or Medicaid.  Handout given on Affordable Care Act  .Instructed patient on breast self-exam using teach back method. CBE unremarkable.  Patient's sister was recently diagnosed with breast cancer and is undergoing treatment with chemotherapy.    Plan:     Sent for bilateral screening mammogram.  Specimen collected for pap.

## 2014-07-29 ENCOUNTER — Other Ambulatory Visit: Payer: Self-pay | Admitting: Oncology

## 2014-07-29 ENCOUNTER — Ambulatory Visit
Admission: RE | Admit: 2014-07-29 | Discharge: 2014-07-29 | Disposition: A | Discharge status: Home / Self Care | Payer: Medicaid Other | Source: Ambulatory Visit | Attending: Oncology | Admitting: Oncology

## 2014-07-29 ENCOUNTER — Other Ambulatory Visit: Payer: Self-pay

## 2014-07-29 DIAGNOSIS — N63 Unspecified lump in unspecified breast: Secondary | ICD-10-CM

## 2014-07-30 ENCOUNTER — Ambulatory Visit: Payer: Medicaid Other | Attending: Otolaryngology

## 2014-07-30 DIAGNOSIS — F5101 Primary insomnia: Secondary | ICD-10-CM | POA: Insufficient documentation

## 2014-07-30 DIAGNOSIS — G4733 Obstructive sleep apnea (adult) (pediatric): Secondary | ICD-10-CM | POA: Diagnosis present

## 2014-07-31 LAB — PAP LB AND HPV HIGH-RISK
HPV, high-risk: NEGATIVE
PAP SMEAR COMMENT: 0

## 2014-08-03 ENCOUNTER — Ambulatory Visit
Admission: RE | Admit: 2014-08-03 | Discharge: 2014-08-03 | Disposition: A | Discharge status: Home / Self Care | Payer: Self-pay | Source: Ambulatory Visit | Attending: Oncology | Admitting: Oncology

## 2014-08-03 ENCOUNTER — Other Ambulatory Visit: Payer: Self-pay | Admitting: Oncology

## 2014-08-03 DIAGNOSIS — N63 Unspecified lump in unspecified breast: Secondary | ICD-10-CM

## 2014-08-03 NOTE — Progress Notes (Signed)
Patient ID: Robin Howard, female   DOB: 09/03/1960, 54 y.o.   MRN: [id number] Patient retruned today for additional views right breast, and right breast ultrasound which howed a sebaceous cyst.  Ultrasound guided biopsy was performed on left beast. Phoned patient as follow-up post biospy. States she is doing well.

## 2014-08-04 ENCOUNTER — Other Ambulatory Visit: Payer: Self-pay | Admitting: Family Medicine

## 2014-08-05 ENCOUNTER — Telehealth: Payer: Self-pay

## 2014-08-05 LAB — SURGICAL PATHOLOGY

## 2014-08-05 NOTE — Telephone Encounter (Signed)
Received positive pathology report from Dr. Dicie Beam for invasive mammary carcinoma.  Phoned patient with results.  Her sister is with her for support.  Sister diagnosed recently with breast cancer, and is undergoing chemotherapy.  MYRISK negative results on sister.  Patient is scheduled to see Dr. Grayland Ormond in [facility] 08/10/14.  Will apply for [facility] at that time.  Offered to see patient today, but she wants to wait to come on Tuesday.

## 2014-08-09 ENCOUNTER — Telehealth: Payer: Self-pay

## 2014-08-09 NOTE — Telephone Encounter (Signed)
Patient phoned requesting surgical consult following Breast Clinic visit tomorrow.  Spoke to Dr. Bary Castilla.  He will see patient at 11:30 tomorrow 08/10/14.  Instructed patient to arrive at 11:00 with photo ID, and current medications.

## 2014-08-10 ENCOUNTER — Encounter: Payer: Self-pay | Admitting: Oncology

## 2014-08-10 ENCOUNTER — Inpatient Hospital Stay: Payer: Medicaid Other | Attending: Oncology | Admitting: Oncology

## 2014-08-10 ENCOUNTER — Encounter: Payer: Self-pay | Admitting: General Surgery

## 2014-08-10 ENCOUNTER — Ambulatory Visit (INDEPENDENT_AMBULATORY_CARE_PROVIDER_SITE_OTHER): Payer: Medicaid Other | Admitting: General Surgery

## 2014-08-10 ENCOUNTER — Other Ambulatory Visit: Payer: PRIVATE HEALTH INSURANCE

## 2014-08-10 VITALS — BP 138/72 | HR 76 | Resp 14 | Ht 66.0 in | Wt 234.0 lb

## 2014-08-10 VITALS — BP 147/74 | HR 64 | Temp 97.4°F | Resp 20 | Ht 65.35 in | Wt 242.1 lb

## 2014-08-10 DIAGNOSIS — N63 Unspecified lump in breast: Secondary | ICD-10-CM | POA: Diagnosis not present

## 2014-08-10 DIAGNOSIS — Z7982 Long term (current) use of aspirin: Secondary | ICD-10-CM

## 2014-08-10 DIAGNOSIS — N6320 Unspecified lump in the left breast, unspecified quadrant: Secondary | ICD-10-CM

## 2014-08-10 DIAGNOSIS — E039 Hypothyroidism, unspecified: Secondary | ICD-10-CM | POA: Diagnosis not present

## 2014-08-10 DIAGNOSIS — Z803 Family history of malignant neoplasm of breast: Secondary | ICD-10-CM | POA: Insufficient documentation

## 2014-08-10 DIAGNOSIS — N3281 Overactive bladder: Secondary | ICD-10-CM | POA: Diagnosis not present

## 2014-08-10 DIAGNOSIS — K219 Gastro-esophageal reflux disease without esophagitis: Secondary | ICD-10-CM | POA: Diagnosis not present

## 2014-08-10 DIAGNOSIS — D649 Anemia, unspecified: Secondary | ICD-10-CM

## 2014-08-10 DIAGNOSIS — R0602 Shortness of breath: Secondary | ICD-10-CM | POA: Insufficient documentation

## 2014-08-10 DIAGNOSIS — I10 Essential (primary) hypertension: Secondary | ICD-10-CM | POA: Diagnosis not present

## 2014-08-10 DIAGNOSIS — C50912 Malignant neoplasm of unspecified site of left female breast: Secondary | ICD-10-CM

## 2014-08-10 DIAGNOSIS — F419 Anxiety disorder, unspecified: Secondary | ICD-10-CM | POA: Diagnosis not present

## 2014-08-10 DIAGNOSIS — F329 Major depressive disorder, single episode, unspecified: Secondary | ICD-10-CM | POA: Insufficient documentation

## 2014-08-10 DIAGNOSIS — Z8669 Personal history of other diseases of the nervous system and sense organs: Secondary | ICD-10-CM | POA: Diagnosis not present

## 2014-08-10 DIAGNOSIS — Z79899 Other long term (current) drug therapy: Secondary | ICD-10-CM | POA: Diagnosis not present

## 2014-08-10 DIAGNOSIS — G4700 Insomnia, unspecified: Secondary | ICD-10-CM | POA: Insufficient documentation

## 2014-08-10 DIAGNOSIS — Z171 Estrogen receptor negative status [ER-]: Secondary | ICD-10-CM

## 2014-08-10 DIAGNOSIS — G4730 Sleep apnea, unspecified: Secondary | ICD-10-CM | POA: Insufficient documentation

## 2014-08-10 DIAGNOSIS — I252 Old myocardial infarction: Secondary | ICD-10-CM | POA: Diagnosis not present

## 2014-08-10 DIAGNOSIS — C50512 Malignant neoplasm of lower-outer quadrant of left female breast: Secondary | ICD-10-CM | POA: Insufficient documentation

## 2014-08-10 DIAGNOSIS — I319 Disease of pericardium, unspecified: Secondary | ICD-10-CM | POA: Insufficient documentation

## 2014-08-10 DIAGNOSIS — E669 Obesity, unspecified: Secondary | ICD-10-CM

## 2014-08-10 NOTE — H&P (Addendum)
HPI  Robin Howard is a 54 y.o. female who presents for a breast evaluation. The most recent mammogram was done on 07/29/14 and left breast ultrasound and biopsy on 08/03/14.  The patient's prior mammogram was in 2010.  Patient does not perform regular self breast checks and does not get regular mammograms done. Sister found out two months ago she breast cancer seeing Dr. Burt Howard. Sister had the Genetic testing done and it came back negative.  The patient was evaluated by Robin Howard, M.D. this morning. Results of that consultation not available.  The patient was accompanied today by her son who is in the [employer] reserves, and getting ready to come out for training in [place].  HPI  Past Medical History   Diagnosis  Date   .  Major depressive disorder, recurrent    .  HTN (hypertension)    .  Overactive bladder    .  Migraines    .  Gastritis    .  Hypothyroidism    .  OSA (obstructive sleep apnea)      untreated due to lack of insurance   .  Insomnia    .  GERD (gastroesophageal reflux disease)    .  Pericarditis  1997   .  Detached retina  1997   .  Chronic headache    .  Morbid obesity    .  NSTEMI (non-ST elevated myocardial infarction)  04/16/14     Due to demand- normal Cath 04/19/14   .  Breast cancer     Past Surgical History   Procedure  Laterality  Date   .  Back surgery       L4-5   .  Knee surgery     .  Cesarean section     .  Coronary angioplasty   April 2016    Family History   Problem  Relation  Age of Onset   .  Alcohol abuse  Mother    .  Thyroid disease  Mother    .  Hypertension  Mother    .  COPD  Mother    .  Cancer  Father      liposarcoma   .  Congestive Heart Failure  Father    .  Depression  Father    .  Hypertension  Father    .  Cancer  Maternal Uncle      Pancreatic cancer   .  Heart disease  Paternal Grandfather  39     MI   .  Diabetes  Other    .  Cancer  Sister      breast   .  Breast cancer  Sister  50    Social History  History     Substance Use Topics   .  Smoking status:  Never Smoker   .  Smokeless tobacco:  Never Used   .  Alcohol Use:  No    Allergies   Allergen  Reactions   .  Paxil [Paroxetine Hcl]  Other (See Comments)     Blurred vision   .  Reglan [Metoclopramide]  Other (See Comments)     Dystonic Reaction   .  Ultram [Tramadol]     Current Outpatient Prescriptions   Medication  Sig  Dispense  Refill   .  aspirin EC 81 MG tablet  Take 81 mg by mouth daily.     Marland Kitchen  escitalopram (LEXAPRO) 20 MG tablet  Take 1 tablet (  20 mg total) by mouth daily. Stop the escitalopram  30 tablet  2   .  levothyroxine (SYNTHROID, LEVOTHROID) 100 MCG tablet  Take 1 tablet (100 mcg total) by mouth daily.  30 tablet  11   .  omeprazole (PRILOSEC) 40 MG capsule  Take 1 capsule (40 mg total) by mouth daily.  30 capsule  3    No current facility-administered medications for this visit.    Review of Systems  Review of Systems  Constitutional: Negative.  Respiratory: Negative.  Cardiovascular: Negative.   Blood pressure 138/72, pulse 76, resp. rate 14, height '5\' 6"'  (1.676 m), weight 234 lb (106.142 kg), last menstrual period 07/27/2012.  Physical Exam  Physical Exam  Constitutional: She is oriented to person, place, and time. She appears well-developed and well-nourished.  HENT:  Mouth/Throat: Oropharynx is clear and moist. No oropharyngeal exudate.  Eyes: Conjunctivae are normal. No scleral icterus.  Neck: Neck supple.  Cardiovascular: Normal rate, regular rhythm and normal heart sounds.  Pulmonary/Chest: Effort normal and breath sounds normal. Right breast exhibits no inverted nipple, no mass, no nipple discharge and no skin change. Left breast exhibits no inverted nipple, no mass, no nipple discharge, no skin change and no tenderness.    2 cm firm hematoma at biopsy site left breast  Lymphadenopathy:  She has no cervical adenopathy.  Neurological: She is alert and oriented to person, place, and time.  Skin: Skin  is warm and dry.   Data Reviewed  April 2016 cath report: No coronary disease. Normal echocardiogram.  2016 and 2010 mammograms reviewed.  Pathology from recently completed a left breast biopsy reviewed. ER negative, PR negative, HER-2/neu 3+.   Ultrasound examination was completed to assess visibility of the previously placed biopsy clip. In the left breast at the 5:00 position, 6 cm from the nipple a complex mass consistent with the recently completed biopsy measuring 1.3 x 1.3 x 1.36 cm was identified. This is within 1 cm of the overlying skin. BI-RADS-6.  Assessment   Stage I carcinoma the left breast.   Plan   Options for management were reviewed with the patient and her son. Breast conservation and mastectomy were presented as equivalent procedures. The role for post surgery radiation was reviewed. The patient's sister recently underwent breast conservation under the care of Dr. Burt Howard, and at present that is her desired form treatment. To facilitate scheduling around her sons upcoming departure surgery will be scheduled in an expedited fashion.  Patient is scheduled for surgery at [facility] on 08/13/14. She will report to the radiology desk that day at 10:00 am. She will pre admit by phone on 08/12/14. Patient is aware of dates, time, and all instructions.   PCP: Robin Howard

## 2014-08-10 NOTE — Progress Notes (Signed)
Patient ID: Robin Howard, female   DOB: [birth year], 54 y.o.   MRN: [id number]  Chief Complaint  Patient presents with  . Other    breast cancer    HPI Robin Howard is a 54 y.o. female who presents for a breast evaluation. The most recent mammogram was done on 07/29/14 and left breast ultrasound and biopsy on 08/03/14.  The patient's prior mammogram was in 2010.  Patient does not perform regular self breast checks and does not get  regular mammograms done. Sister found out two months ago she breast cancer seeing Dr. Burt Knack. Sister had the Genetic testing done and it  came back negative.  The patient was evaluated by [name], M.D. this morning. Results of that consultation not available.  The patient was accompanied today by her son who is in the [employer] reserves, and getting ready to come out for training in [place].  HPI  Past Medical History  Diagnosis Date  . Major depressive disorder, recurrent   . HTN (hypertension)   . Overactive bladder   . Migraines   . Gastritis   . Hypothyroidism   . OSA (obstructive sleep apnea)     untreated due to lack of insurance  . Insomnia   . GERD (gastroesophageal reflux disease)   . Pericarditis 1997  . Detached retina 1997  . Chronic headache   . Morbid obesity   . NSTEMI (non-ST elevated myocardial infarction) 04/16/14    Due to demand- normal Cath 04/19/14  . Breast cancer     Past Surgical History  Procedure Laterality Date  . Back surgery      L4-5  . Knee surgery    . Cesarean section    . Coronary angioplasty  April 2016    Family History  Problem Relation Age of Onset  . Alcohol abuse Mother   . Thyroid disease Mother   . Hypertension Mother   . COPD Mother   . Cancer Father     liposarcoma  . Congestive Heart Failure Father   . Depression Father   . Hypertension Father   . Cancer Maternal Uncle     Pancreatic cancer  . Heart disease Paternal Grandfather 79    MI  . Diabetes Other   . Cancer Sister    breast  . Breast cancer Sister 27    Social History History  Substance Use Topics  . Smoking status: Never Smoker   . Smokeless tobacco: Never Used  . Alcohol Use: No    Allergies  Allergen Reactions  . Paxil [Paroxetine Hcl] Other (See Comments)    Blurred vision  . Reglan [Metoclopramide] Other (See Comments)    Dystonic Reaction  . Ultram [Tramadol]     Current Outpatient Prescriptions  Medication Sig Dispense Refill  . aspirin EC 81 MG tablet Take 81 mg by mouth daily.    Marland Kitchen escitalopram (LEXAPRO) 20 MG tablet Take 1 tablet (20 mg total) by mouth daily. Stop the escitalopram 30 tablet 2  . levothyroxine (SYNTHROID, LEVOTHROID) 100 MCG tablet Take 1 tablet (100 mcg total) by mouth daily. 30 tablet 11  . omeprazole (PRILOSEC) 40 MG capsule Take 1 capsule (40 mg total) by mouth daily. 30 capsule 3   No current facility-administered medications for this visit.    Review of Systems Review of Systems  Constitutional: Negative.   Respiratory: Negative.   Cardiovascular: Negative.     Blood pressure 138/72, pulse 76, resp. rate 14, height _0  (1.676 m), weight  234 lb (106.142 kg), last menstrual period 07/27/2012.  Physical Exam Physical Exam  Constitutional: She is oriented to person, place, and time. She appears well-developed and well-nourished.  HENT:  Mouth/Throat: Oropharynx is clear and moist. No oropharyngeal exudate.  Eyes: Conjunctivae are normal. No scleral icterus.  Neck: Neck supple.  Cardiovascular: Normal rate, regular rhythm and normal heart sounds.   Pulmonary/Chest: Effort normal and breath sounds normal. Right breast exhibits no inverted nipple, no mass, no nipple discharge and no skin change. Left breast exhibits no inverted nipple, no mass, no nipple discharge, no skin change and no tenderness.    2 cm firm hematoma at biopsy site left breast   Lymphadenopathy:    She has no cervical adenopathy.  Neurological: She is alert and oriented to  person, place, and time.  Skin: Skin is warm and dry.    Data Reviewed April 2016 cath report: No coronary disease. Normal echocardiogram.  2016 and 2010 mammograms reviewed.  Pathology from recently completed a left breast biopsy reviewed. ER negative, PR negative, HER-2/neu 3+.  Ultrasound examination was completed to assess visibility of the previously placed biopsy clip. In the left breast at the 5:00 position, 6 cm from the nipple a complex mass consistent with the recently completed biopsy measuring 1.3 x 1.3 x 1.36 cm was identified. This is within 1 cm of the overlying skin. BI-RADS-6.  Assessment    Stage I carcinoma the left breast.    Plan    Options for management were reviewed with the patient and her son. Breast conservation and mastectomy were presented as equivalent procedures. The role for post surgery radiation was reviewed. The patient's sister recently underwent breast conservation under the care of Dr. Burt Knack, and at present that is her desired form treatment. To facilitate scheduling around her sons upcoming departure surgery will be scheduled in an expedited fashion. Patient is scheduled for surgery at [facility] on 08/13/14. She will report to the radiology desk that day at 10:00 am. She will pre admit by phone on 08/12/14. Patient is aware of dates, time, and all instructions.      PCP:  Gelene Mink 08/10/2014, 4:30 PM

## 2014-08-10 NOTE — Patient Instructions (Signed)
Patient is scheduled for surgery at [facility] on 08/13/14. She will report to the radiology desk that day at 10:00 am. She will pre admit by phone on 08/12/14. Patient is aware of dates, time, and all instructions.

## 2014-08-10 NOTE — Progress Notes (Signed)
Met patient, and son Marylyn Ishihara at initial Medical Oncology visit.  Gave Breast Cancer Treatment Handbook/folder with hospital services.  Dr. Grayland Ormond would like to see patient one week after her surgery.  She is meeting with Dr. Bary Castilla immediately after this appointment.  BRCA test sent. BCCMedicaid application completed.

## 2014-08-11 ENCOUNTER — Telehealth: Payer: Self-pay

## 2014-08-11 NOTE — Telephone Encounter (Signed)
Patient has surgery with Dr. Bary Castilla this Friday 08/13/14.  When would you like her to f/u with you?

## 2014-08-11 NOTE — Telephone Encounter (Signed)
About 1-2 weeks later.  Thanks.

## 2014-08-12 ENCOUNTER — Encounter
Admission: RE | Admit: 2014-08-12 | Discharge: 2014-08-12 | Disposition: A | Discharge status: Home / Self Care | Payer: Self-pay | Source: Ambulatory Visit | Attending: General Surgery | Admitting: General Surgery

## 2014-08-12 ENCOUNTER — Encounter: Payer: Self-pay | Admitting: *Deleted

## 2014-08-12 ENCOUNTER — Ambulatory Visit: Payer: Self-pay | Admitting: Family Medicine

## 2014-08-12 HISTORY — DX: Anemia, unspecified: D64.9

## 2014-08-12 HISTORY — DX: Family history of other specified conditions: Z84.89

## 2014-08-12 HISTORY — DX: Bronchitis, not specified as acute or chronic: J40

## 2014-08-12 NOTE — Patient Instructions (Signed)
  Your procedure is scheduled on: 08-13-14 Report to [place] (2ND DESK ON RIGHT)   Remember: Instructions that are not followed completely may result in serious medical risk, up to and including death, or upon the discretion of your surgeon and anesthesiologist your surgery may need to be rescheduled.    _X___ 1. Do not eat food or drink liquids after midnight. No gum chewing or hard candies.     _X___ 2. No Alcohol for 24 hours before or after surgery.   ____ 3. Bring all medications with you on the day of surgery if instructed.    _X___ 4. Notify your doctor if there is any change in your medical condition     (cold, fever, infections).     Do not wear jewelry, make-up, hairpins, clips or nail polish.  Do not wear lotions, powders, or perfumes. You may wear deodorant.  Do not shave 48 hours prior to surgery. Men may shave face and neck.  Do not bring valuables to the hospital.    [facility] is not responsible for any belongings or valuables.               Contacts, dentures or bridgework may not be worn into surgery.  Leave your suitcase in the car. After surgery it may be brought to your room.  For patients admitted to the hospital, discharge time is determined by your treatment team.   Patients discharged the day of surgery will not be allowed to drive home.   Please read over the following fact sheets that you were given:      _X___ Take these medicines the morning of surgery with A SIP OF WATER:    1. SYNTHROID (LEVOTHYROXINE)  2. LEXAPRO (ESCITALOPRAM)  3. PRILOSEC (OMEPRAZOLE)  4. TAKE AN EXTRA DOSE OF PRILOSEC TONIGHT BEFORE BED  5.  6.  ____ Fleet Enema (as directed)   _X___ Use CHG Soap as directed  ____ Use inhalers on the day of surgery  ____ Stop metformin 2 days prior to surgery    ____ Take 1/2 of usual insulin dose the night before surgery and none on the morning of surgery.   ____ Stop Coumadin/Plavix/aspirin-NO ASPIRIN MORNING OF  SURGERY  ____ Stop Anti-inflammatories-NO NSAIDS OR ASPIRIN PRODUCTS-TYLENOL OK   ____ Stop supplements until after surgery.    ____ Bring C-Pap to the hospital.

## 2014-08-13 ENCOUNTER — Ambulatory Visit
Admission: RE | Admit: 2014-08-13 | Discharge: 2014-08-13 | Disposition: A | Discharge status: Home / Self Care | Payer: Medicaid Other | Source: Ambulatory Visit | Attending: General Surgery | Admitting: General Surgery

## 2014-08-13 ENCOUNTER — Ambulatory Visit: Payer: Medicaid Other

## 2014-08-13 ENCOUNTER — Encounter
Admission: RE | Disposition: A | Discharge status: Home / Self Care | Payer: Self-pay | Source: Ambulatory Visit | Attending: General Surgery

## 2014-08-13 ENCOUNTER — Ambulatory Visit: Payer: Medicaid Other | Admitting: *Deleted

## 2014-08-13 DIAGNOSIS — K2970 Gastritis, unspecified, without bleeding: Secondary | ICD-10-CM | POA: Insufficient documentation

## 2014-08-13 DIAGNOSIS — Z8349 Family history of other endocrine, nutritional and metabolic diseases: Secondary | ICD-10-CM | POA: Diagnosis not present

## 2014-08-13 DIAGNOSIS — Z6838 Body mass index (BMI) 38.0-38.9, adult: Secondary | ICD-10-CM | POA: Diagnosis not present

## 2014-08-13 DIAGNOSIS — Z808 Family history of malignant neoplasm of other organs or systems: Secondary | ICD-10-CM | POA: Insufficient documentation

## 2014-08-13 DIAGNOSIS — I10 Essential (primary) hypertension: Secondary | ICD-10-CM | POA: Diagnosis not present

## 2014-08-13 DIAGNOSIS — G4700 Insomnia, unspecified: Secondary | ICD-10-CM | POA: Diagnosis not present

## 2014-08-13 DIAGNOSIS — N3281 Overactive bladder: Secondary | ICD-10-CM | POA: Insufficient documentation

## 2014-08-13 DIAGNOSIS — Z170 Estrogen receptor positive status [ER+]: Secondary | ICD-10-CM | POA: Insufficient documentation

## 2014-08-13 DIAGNOSIS — C50912 Malignant neoplasm of unspecified site of left female breast: Secondary | ICD-10-CM | POA: Diagnosis not present

## 2014-08-13 DIAGNOSIS — E039 Hypothyroidism, unspecified: Secondary | ICD-10-CM | POA: Insufficient documentation

## 2014-08-13 DIAGNOSIS — K219 Gastro-esophageal reflux disease without esophagitis: Secondary | ICD-10-CM | POA: Diagnosis not present

## 2014-08-13 DIAGNOSIS — Z8249 Family history of ischemic heart disease and other diseases of the circulatory system: Secondary | ICD-10-CM | POA: Diagnosis not present

## 2014-08-13 DIAGNOSIS — Z811 Family history of alcohol abuse and dependence: Secondary | ICD-10-CM | POA: Insufficient documentation

## 2014-08-13 DIAGNOSIS — G4733 Obstructive sleep apnea (adult) (pediatric): Secondary | ICD-10-CM | POA: Insufficient documentation

## 2014-08-13 DIAGNOSIS — Z825 Family history of asthma and other chronic lower respiratory diseases: Secondary | ICD-10-CM | POA: Insufficient documentation

## 2014-08-13 DIAGNOSIS — Z7982 Long term (current) use of aspirin: Secondary | ICD-10-CM | POA: Diagnosis not present

## 2014-08-13 DIAGNOSIS — F329 Major depressive disorder, single episode, unspecified: Secondary | ICD-10-CM | POA: Diagnosis not present

## 2014-08-13 DIAGNOSIS — Z803 Family history of malignant neoplasm of breast: Secondary | ICD-10-CM | POA: Diagnosis not present

## 2014-08-13 DIAGNOSIS — R0602 Shortness of breath: Secondary | ICD-10-CM | POA: Insufficient documentation

## 2014-08-13 DIAGNOSIS — D0512 Intraductal carcinoma in situ of left breast: Secondary | ICD-10-CM | POA: Diagnosis not present

## 2014-08-13 DIAGNOSIS — Z833 Family history of diabetes mellitus: Secondary | ICD-10-CM | POA: Insufficient documentation

## 2014-08-13 DIAGNOSIS — G43909 Migraine, unspecified, not intractable, without status migrainosus: Secondary | ICD-10-CM | POA: Insufficient documentation

## 2014-08-13 DIAGNOSIS — Z888 Allergy status to other drugs, medicaments and biological substances status: Secondary | ICD-10-CM | POA: Insufficient documentation

## 2014-08-13 DIAGNOSIS — C50312 Malignant neoplasm of lower-inner quadrant of left female breast: Secondary | ICD-10-CM | POA: Diagnosis not present

## 2014-08-13 DIAGNOSIS — I252 Old myocardial infarction: Secondary | ICD-10-CM | POA: Diagnosis not present

## 2014-08-13 DIAGNOSIS — Z171 Estrogen receptor negative status [ER-]: Secondary | ICD-10-CM | POA: Insufficient documentation

## 2014-08-13 DIAGNOSIS — Z79899 Other long term (current) drug therapy: Secondary | ICD-10-CM | POA: Insufficient documentation

## 2014-08-13 DIAGNOSIS — N6320 Unspecified lump in the left breast, unspecified quadrant: Secondary | ICD-10-CM

## 2014-08-13 HISTORY — PX: BREAST LUMPECTOMY WITH SENTINEL LYMPH NODE BIOPSY: SHX5597

## 2014-08-13 SURGERY — BREAST LUMPECTOMY WITH SENTINEL LYMPH NODE BX
Anesthesia: General | Laterality: Left | Wound class: Clean

## 2014-08-13 MED ORDER — FENTANYL CITRATE (PF) 100 MCG/2ML IJ SOLN
25.0000 ug | INTRAMUSCULAR | Status: DC | PRN
  Administered 2014-08-13: 50 ug via INTRAVENOUS

## 2014-08-13 MED ORDER — PROPOFOL 10 MG/ML IV BOLUS
INTRAVENOUS | Status: DC | PRN
  Administered 2014-08-13: 200 mg via INTRAVENOUS

## 2014-08-13 MED ORDER — ONDANSETRON HCL 4 MG/2ML IJ SOLN
INTRAMUSCULAR | Status: DC | PRN
  Administered 2014-08-13: 4 mg via INTRAVENOUS

## 2014-08-13 MED ORDER — LACTATED RINGERS IV SOLN
INTRAVENOUS | Status: DC
  Administered 2014-08-13 (×2): via INTRAVENOUS

## 2014-08-13 MED ORDER — ACETAMINOPHEN 10 MG/ML IV SOLN
INTRAVENOUS | Status: AC
  Filled 2014-08-13: qty 100

## 2014-08-13 MED ORDER — FENTANYL CITRATE (PF) 100 MCG/2ML IJ SOLN
INTRAMUSCULAR | Status: DC | PRN
  Administered 2014-08-13: 100 ug via INTRAVENOUS
  Administered 2014-08-13: 50 ug via INTRAVENOUS

## 2014-08-13 MED ORDER — ONDANSETRON HCL 4 MG/2ML IJ SOLN
INTRAMUSCULAR | Status: AC
  Administered 2014-08-13: 4 mg via INTRAVENOUS
  Filled 2014-08-13: qty 2

## 2014-08-13 MED ORDER — METHYLENE BLUE 1 % INJ SOLN
INTRAMUSCULAR | Status: DC | PRN
  Administered 2014-08-13: 1.5 mL via SUBMUCOSAL

## 2014-08-13 MED ORDER — HYDROCODONE-ACETAMINOPHEN 5-325 MG PO TABS: 1.0000 | ORAL_TABLET | ORAL | Status: DC | PRN

## 2014-08-13 MED ORDER — FENTANYL CITRATE (PF) 100 MCG/2ML IJ SOLN
INTRAMUSCULAR | Status: AC
  Filled 2014-08-13: qty 2

## 2014-08-13 MED ORDER — MIDAZOLAM HCL 2 MG/2ML IJ SOLN
INTRAMUSCULAR | Status: DC | PRN
  Administered 2014-08-13: 2 mg via INTRAVENOUS

## 2014-08-13 MED ORDER — ONDANSETRON HCL 4 MG/2ML IJ SOLN
4.0000 mg | Freq: Once | INTRAMUSCULAR | Status: AC | PRN
  Administered 2014-08-13: 4 mg via INTRAVENOUS

## 2014-08-13 MED ORDER — TECHNETIUM TC 99M SULFUR COLLOID
1.1400 | Freq: Once | INTRAVENOUS | Status: AC | PRN
  Administered 2014-08-13: 1.14 via INTRAVENOUS

## 2014-08-13 MED ORDER — HYDROCODONE-ACETAMINOPHEN 5-325 MG PO TABS
1.0000 | ORAL_TABLET | Freq: Once | ORAL | Status: AC
  Administered 2014-08-13: 1 via ORAL

## 2014-08-13 MED ORDER — LIDOCAINE HCL (CARDIAC) 20 MG/ML IV SOLN
INTRAVENOUS | Status: DC | PRN
  Administered 2014-08-13: 50 mg via INTRAVENOUS

## 2014-08-13 MED ORDER — BUPIVACAINE-EPINEPHRINE (PF) 0.5% -1:200000 IJ SOLN
INTRAMUSCULAR | Status: DC | PRN
  Administered 2014-08-13: 30 mL

## 2014-08-13 MED ORDER — ACETAMINOPHEN 10 MG/ML IV SOLN
INTRAVENOUS | Status: DC | PRN
  Administered 2014-08-13: 1000 mg via INTRAVENOUS

## 2014-08-13 MED ORDER — HYDROCODONE-ACETAMINOPHEN 5-325 MG PO TABS
ORAL_TABLET | ORAL | Status: AC
  Administered 2014-08-13: 1 via ORAL
  Filled 2014-08-13: qty 1

## 2014-08-13 SURGICAL SUPPLY — 63 items
APL SKNCLS STERI-STRIP NONHPOA (GAUZE/BANDAGES/DRESSINGS) ×1
APPLIER CLIP 11 MED OPEN (CLIP)
APR CLP MED 11 20 MLT OPN (CLIP)
BANDAGE ELASTIC 6 CLIP NS LF (GAUZE/BANDAGES/DRESSINGS) ×3 IMPLANT
BENZOIN TINCTURE PRP APPL 2/3 (GAUZE/BANDAGES/DRESSINGS) ×2 IMPLANT
BLADE SURG 15 STRL SS SAFETY (BLADE) ×6 IMPLANT
BNDG GAUZE 4.5X4.1 6PLY STRL (MISCELLANEOUS) ×1 IMPLANT
BULB RESERV EVAC DRAIN JP 100C (MISCELLANEOUS) IMPLANT
CANISTER SUCT 1200ML W/VALVE (MISCELLANEOUS) ×3 IMPLANT
CHLORAPREP W/TINT 26ML (MISCELLANEOUS) ×3 IMPLANT
CLIP APPLIE 11 MED OPEN (CLIP) ×1 IMPLANT
CLOSURE WOUND 1/2 X4 (GAUZE/BANDAGES/DRESSINGS) ×1
CNTNR SPEC 2.5X3XGRAD LEK (MISCELLANEOUS) ×1
CONT SPEC 4OZ STER OR WHT (MISCELLANEOUS) ×2
CONT SPEC 4OZ STRL OR WHT (MISCELLANEOUS) ×1
CONTAINER SPEC 2.5X3XGRAD LEK (MISCELLANEOUS) ×1 IMPLANT
COVER PROBE FLX POLY STRL (MISCELLANEOUS) ×3 IMPLANT
DEVICE DUBIN SPECIMEN MAMMOGRA (MISCELLANEOUS) ×3 IMPLANT
DRAIN CHANNEL JP 15F RND 16 (MISCELLANEOUS) IMPLANT
DRAPE LAPAROTOMY TRNSV 106X77 (MISCELLANEOUS) ×1 IMPLANT
DRESSING TELFA 4X3 1S ST N-ADH (GAUZE/BANDAGES/DRESSINGS) ×3 IMPLANT
DRSG TEGADERM 4X4.75 (GAUZE/BANDAGES/DRESSINGS) ×3 IMPLANT
DRSG TELFA 3X8 NADH (GAUZE/BANDAGES/DRESSINGS) ×3 IMPLANT
ELECT CAUTERY BLADE TIP 2.5 (TIP) ×3
ELECTRODE CAUTERY BLDE TIP 2.5 (TIP) ×1 IMPLANT
GAUZE FLUFF 18X24 1PLY STRL (GAUZE/BANDAGES/DRESSINGS) ×3 IMPLANT
GAUZE SPONGE 4X4 12PLY STRL (GAUZE/BANDAGES/DRESSINGS) ×3 IMPLANT
GLOVE BIO SURGEON STRL SZ7.5 (GLOVE) ×5 IMPLANT
GLOVE INDICATOR 8.0 STRL GRN (GLOVE) ×5 IMPLANT
GOWN STRL REUS W/ TWL LRG LVL3 (GOWN DISPOSABLE) ×2 IMPLANT
GOWN STRL REUS W/TWL LRG LVL3 (GOWN DISPOSABLE) ×6
HARMONIC SCALPEL FOCUS (MISCELLANEOUS) ×1 IMPLANT
KIT RM TURNOVER STRD PROC AR (KITS) ×3 IMPLANT
LABEL OR SOLS (LABEL) ×3 IMPLANT
MARGIN MAP 10MM (MISCELLANEOUS) ×3 IMPLANT
NDL SAFETY 22GX1.5 (NEEDLE) ×3 IMPLANT
NDL SAFETY 25GX1.5 (NEEDLE) ×6 IMPLANT
NS IRRIG 500ML POUR BTL (IV SOLUTION) ×3 IMPLANT
PACK BASIN MINOR ARMC (MISCELLANEOUS) ×3 IMPLANT
PAD DRESSING TELFA 3X8 NADH (GAUZE/BANDAGES/DRESSINGS) ×1 IMPLANT
PAD GROUND ADULT SPLIT (MISCELLANEOUS) ×3 IMPLANT
SHEARS FOC LG CVD HARMONIC 17C (MISCELLANEOUS) ×1 IMPLANT
SLEVE PROBE SENORX GAMMA FIND (MISCELLANEOUS) ×3 IMPLANT
STRIP CLOSURE SKIN 1/2X4 (GAUZE/BANDAGES/DRESSINGS) ×2 IMPLANT
SUT ETHILON 3-0 FS-10 30 BLK (SUTURE) ×3
SUT SILK 2 0 (SUTURE) ×3
SUT SILK 2-0 18XBRD TIE 12 (SUTURE) ×1 IMPLANT
SUT VIC AB 2-0 CT1 27 (SUTURE) ×9
SUT VIC AB 2-0 CT1 TAPERPNT 27 (SUTURE) ×2 IMPLANT
SUT VIC AB 3-0 54X BRD REEL (SUTURE) ×1 IMPLANT
SUT VIC AB 3-0 BRD 54 (SUTURE) ×3
SUT VIC AB 3-0 SH 27 (SUTURE) ×6
SUT VIC AB 3-0 SH 27X BRD (SUTURE) ×2 IMPLANT
SUT VIC AB 4-0 FS2 27 (SUTURE) ×8 IMPLANT
SUT VIC AB 4-0 PS2 18 (SUTURE) ×3 IMPLANT
SUT VICRYL+ 3-0 144IN (SUTURE) ×3 IMPLANT
SUTURE EHLN 3-0 FS-10 30 BLK (SUTURE) ×1 IMPLANT
SWABSTK COMLB BENZOIN TINCTURE (MISCELLANEOUS) ×3 IMPLANT
SYR BULB IRRIG 60ML STRL (SYRINGE) ×3 IMPLANT
SYR CONTROL 10ML (SYRINGE) ×3 IMPLANT
SYRINGE 10CC LL (SYRINGE) ×3 IMPLANT
TAPE TRANSPORE STRL 2 31045 (GAUZE/BANDAGES/DRESSINGS) ×3 IMPLANT
WATER STERILE IRR 1000ML POUR (IV SOLUTION) ×3 IMPLANT

## 2014-08-13 NOTE — Op Note (Signed)
Preoperative diagnosis: Left breast cancer.  Postoperative diagnosis: Same.  Operative procedure: Left breast wide excision, sentinel node biopsy.  Operating surgeon: Ollen Bowl, M.D.  Anesthesia: Gen. by LMA, Marcaine 0.5% with 1-200,000 epinephrine, 30 mL local infiltration.  Estimated blood loss: Less than 5 mL.  Clinical note: This 54 year old woman was recently noted with a change in her mammogram and biopsy showed evidence of invasive mammary carcinoma. She desired breast conservation. She was injected with technetium sulfur colloid prior to the procedure.  Operative note: With the patient under adequate general anesthesia by LMA the breast was prepped with chlor prep after instilling 4 mL of normal saline/methylene blue diluted to do one in the subareolar plexus. Ultrasound was used to identify the previous biopsy site in the 6:00 position of the left breast. A radial incision was made from the base of the areola to above the inframammary fold. The skin was incised sharply and the remaining dissection completed with electrocautery. A 4 x 4 x 6 cm block of tissue was excised orientated and sent for specimen radiograph. This confirmed the lesion within the resected tissue. Gross examination from pathology showed margins clear.  While the breast specimen was being processed attention was turned toward the axilla. Very low counts were noted. An incision was made at the inferior aspect of the axillary fold. This was made sharply and the remaining dissection with electrocautery. The 6 cm of adipose tissue is divided to open the axillary envelope. Here there was area of increased uptake in a single hot, non-blue node was identified. Frozen section report was negative for macro metastatic disease. A second set node of similar size but neither hot nor node was sent for routine histology as a non-sentinel node.  The axillary wound was closed with interrupted 2-0 Vicryls figure-of-eight sutures.  This was done in multiple layers due to the depth of the adipose tissue. The skin was closed with a running 4-05 septic suture.  Attention was turned to the breast wound. This was closed in multiple layers with 2-0 Vicryls figure-of-eight sutures. The skin was closed with a running 4-0 Vicryls septic suture. Benzoin, Steri-Strips and a Telfa dressing were applied to both wounds. Tegaderm was applied to the axillary wound. Fluff gauze was applied to the breast followed by placement of the patient's bra.  The patient was taken to recovery room in stable condition.

## 2014-08-13 NOTE — Transfer of Care (Signed)
Immediate Anesthesia Transfer of Care Note  Patient: Robin Howard  Procedure(s) Performed: Procedure(s): BREAST LUMPECTOMY WITH SENTINEL LYMPH NODE BIOPSY (Left)  Patient Location: PACU  Anesthesia Type:General  Level of Consciousness: Alert, Awake, Oriented  Airway & Oxygen Therapy: Patient Spontanous Breathing  Post-op Assessment: Report given to RN  Post vital signs: Reviewed and stable  Last Vitals:  Filed Vitals:   08/13/14 1301  BP: 137/93  Pulse: 89  Temp: 36.2 C  Resp: 11    Complications: No apparent anesthesia complications

## 2014-08-13 NOTE — Anesthesia Preprocedure Evaluation (Signed)
Anesthesia Evaluation  Patient identified by MRN, date of birth, ID band Patient awake    Reviewed: Allergy & Precautions, NPO status , Patient's Chart, lab work & pertinent test results  History of Anesthesia Complications (+) Family history of anesthesia reaction  Airway Mallampati: I  TM Distance: >3 FB Neck ROM: Full    Dental  (+) Teeth Intact   Pulmonary shortness of breath and with exertion, sleep apnea ,    Pulmonary exam normal       Cardiovascular hypertension, Normal cardiovascular exam NSTEMI in April 2016, normal cath.   Neuro/Psych    GI/Hepatic GERD-  Medicated and Controlled,  Endo/Other    Renal/GU      Musculoskeletal   Abdominal (+) + obese,   Peds  Hematology   Anesthesia Other Findings   Reproductive/Obstetrics                             Anesthesia Physical Anesthesia Plan  ASA: III  Anesthesia Plan: General   Post-op Pain Management:    Induction: Intravenous  Airway Management Planned: LMA  Additional Equipment:   Intra-op Plan:   Post-operative Plan: Extubation in OR  Informed Consent: I have reviewed the patients History and Physical, chart, labs and discussed the procedure including the risks, benefits and alternatives for the proposed anesthesia with the patient or authorized representative who has indicated his/her understanding and acceptance.     Plan Discussed with: CRNA  Anesthesia Plan Comments:         Anesthesia Quick Evaluation

## 2014-08-13 NOTE — H&P (Signed)
No change in clinical history or exam.  For left breast wide excision and sentinel node biopsy.  

## 2014-08-13 NOTE — Anesthesia Procedure Notes (Signed)
Procedure Name: LMA Insertion Date/Time: 08/13/2014 11:43 AM Performed by: Nelda Marseille Pre-anesthesia Checklist: Patient identified, Patient being monitored, Timeout performed, Emergency Drugs available and Suction available Patient Re-evaluated:Patient Re-evaluated prior to inductionOxygen Delivery Method: Circle system utilized Preoxygenation: Pre-oxygenation with 100% oxygen Intubation Type: IV induction Ventilation: Mask ventilation without difficulty LMA: LMA inserted LMA Size: 4.0 Tube type: Oral Number of attempts: 1 Placement Confirmation: positive ETCO2 and breath sounds checked- equal and bilateral Tube secured with: Tape Dental Injury: Teeth and Oropharynx as per pre-operative assessment

## 2014-08-13 NOTE — Anesthesia Postprocedure Evaluation (Signed)
  Anesthesia Post-op Note  Patient: Robin Howard  Procedure(s) Performed: Procedure(s): BREAST LUMPECTOMY WITH SENTINEL LYMPH NODE BIOPSY (Left)  Anesthesia type:General  Patient location: PACU  Post pain: Pain level controlled  Post assessment: Post-op Vital signs reviewed, Patient's Cardiovascular Status Stable, Respiratory Function Stable, Patent Airway and No signs of Nausea or vomiting  Post vital signs: Reviewed and stable  Last Vitals:  Filed Vitals:   08/13/14 1320  BP:   Pulse: 88  Temp:   Resp: 8    Level of consciousness: awake, alert  and patient cooperative  Complications: No apparent anesthesia complications

## 2014-08-13 NOTE — Discharge Instructions (Signed)

## 2014-08-16 ENCOUNTER — Encounter: Payer: Self-pay | Admitting: General Surgery

## 2014-08-16 ENCOUNTER — Telehealth: Payer: Self-pay

## 2014-08-16 ENCOUNTER — Ambulatory Visit (INDEPENDENT_AMBULATORY_CARE_PROVIDER_SITE_OTHER): Payer: Medicaid Other | Admitting: General Surgery

## 2014-08-16 VITALS — BP 130/74 | HR 78 | Resp 12 | Ht 66.0 in | Wt 246.0 lb

## 2014-08-16 DIAGNOSIS — C50912 Malignant neoplasm of unspecified site of left female breast: Secondary | ICD-10-CM

## 2014-08-16 LAB — SURGICAL PATHOLOGY

## 2014-08-16 NOTE — Progress Notes (Signed)
Patient ID: Robin Howard, female   DOB: May 03, 1960, 54 y.o.   MRN: [id number]  Chief Complaint  Patient presents with  . Routine Post Op    left breast wide excision     HPI Robin Howard is a 54 y.o. female here today for her post op left breast wide excision done on 08/13/14. Patient states she is doing well.  HPI  Past Medical History  Diagnosis Date  . Major depressive disorder, recurrent   . HTN (hypertension)   . Overactive bladder   . Migraines   . Gastritis   . Hypothyroidism   . OSA (obstructive sleep apnea)     untreated due to lack of insurance  . Insomnia   . GERD (gastroesophageal reflux disease)   . Pericarditis 1997  . Detached retina 1997  . Chronic headache   . Morbid obesity   . NSTEMI (non-ST elevated myocardial infarction) 04/16/14    Due to demand- normal Cath 04/19/14  . Breast cancer   . Bronchitis 04-2014  . Shortness of breath dyspnea     with exertion only  . Anemia     h/o  . Pericarditis 1997  . Family history of adverse reaction to anesthesia     pts dad has pseudocholinestrace deficieny    Past Surgical History  Procedure Laterality Date  . Back surgery      L4-5  . Knee surgery    . Cesarean section    . Coronary angioplasty  April 2016  . Retinal detachment surgery Right 1997  . Foot surgery    . Breast lumpectomy with sentinel lymph node biopsy Left 08/13/2014    Procedure: BREAST LUMPECTOMY WITH SENTINEL LYMPH NODE BIOPSY;  Surgeon: Robert Bellow, MD;  Location: [facility] ORS;  Service: General;  Laterality: Left;    Family History  Problem Relation Age of Onset  . Alcohol abuse Mother   . Thyroid disease Mother   . Hypertension Mother   . COPD Mother   . Cancer Father     liposarcoma  . Congestive Heart Failure Father   . Depression Father   . Hypertension Father   . Cancer Maternal Uncle     Pancreatic cancer  . Heart disease Paternal Grandfather 57    MI  . Diabetes Other   . Cancer Sister     breast  . Breast cancer  Sister 76    Social History History  Substance Use Topics  . Smoking status: Never Smoker   . Smokeless tobacco: Never Used  . Alcohol Use: No    Allergies  Allergen Reactions  . Reglan [Metoclopramide] Shortness Of Breath and Other (See Comments)    Dystonic Reaction  . Paxil [Paroxetine Hcl] Other (See Comments)    Blurred vision  . Ultram [Tramadol] Itching and Rash    Current Outpatient Prescriptions  Medication Sig Dispense Refill  . aspirin EC 81 MG tablet Take 81 mg by mouth daily.    . cholecalciferol (VITAMIN D) 1000 UNITS tablet Take 1,000 Units by mouth daily.    Marland Kitchen escitalopram (LEXAPRO) 20 MG tablet Take 1 tablet (20 mg total) by mouth daily. Stop the escitalopram (Patient taking differently: Take 20 mg by mouth every morning. Stop the escitalopram) 30 tablet 2  . HYDROcodone-acetaminophen (NORCO) 5-325 MG per tablet Take 1-2 tablets by mouth every 4 (four) hours as needed for moderate pain. 30 tablet 0  . levothyroxine (SYNTHROID, LEVOTHROID) 100 MCG tablet Take 1 tablet (100 mcg total) by  mouth daily. 30 tablet 11  . Multiple Vitamin (MULTIVITAMIN) capsule Take 1 capsule by mouth daily.    Marland Kitchen omeprazole (PRILOSEC) 40 MG capsule Take 1 capsule (40 mg total) by mouth daily. (Patient taking differently: Take 40 mg by mouth every morning. ) 30 capsule 3  . vitamin B-12 (CYANOCOBALAMIN) 1000 MCG tablet Take 1,000 mcg by mouth daily.     No current facility-administered medications for this visit.    Review of Systems Review of Systems  Constitutional: Negative.   Respiratory: Negative.   Cardiovascular: Negative.     Blood pressure 130/74, pulse 78, resp. rate 12, height _0  (1.676 m), weight 246 lb (111.585 kg), last menstrual period 07/27/2012.  Physical Exam Physical Exam  Constitutional: She is oriented to person, place, and time. She appears well-developed.  Cardiovascular: Normal rate, regular rhythm and normal heart sounds.   Pulmonary/Chest: Effort  normal and breath sounds normal.    Left breast incision is clean and healing well    Neurological: She is alert and oriented to person, place, and time.  Skin: Skin is warm and dry.    Data Reviewed Pathology results became available after the patient's visit. Case presented at [facility] tumor board. Candidate for accelerated partial breast radiation and likely adjuvant chemotherapy based on HER-2/neu status.   Assessment    Doing well status post wide excision and sentinel node biopsy.    Plan    The patient will be following up with Dr. Grayland Ormond on Wednesday this week and Dr. Baruch Gouty will assess her for possible MammoSite treatment at that same time. We will assess her breast anatomy with ultrasound at her visit next week.    Patient to return in one week. PCP:  Gelene Mink 08/17/2014, 6:24 AM

## 2014-08-16 NOTE — Telephone Encounter (Signed)
She was supposed to come in for a med follow up last Thurs. With Dr. Sanda Klein on her Escitalopram. She had to cancel it due to a pre op appointment and had surgery on Friday and found out she has breast cancer. She wants to know if she could go ahead and increase her Escitalopram any higher? [phone]

## 2014-08-16 NOTE — Telephone Encounter (Signed)
This is really best left for Dr. Sanda Klein. Please have her continue her current dose and follow up with Dr. Sanda Klein either over the phone or in person when she gets back next week.

## 2014-08-16 NOTE — Telephone Encounter (Signed)
Patient notified.  Will route message to Dr. lada for her to review when she returns.

## 2014-08-16 NOTE — Patient Instructions (Signed)
Patient to return in one week. 

## 2014-08-17 ENCOUNTER — Telehealth: Payer: Self-pay | Admitting: *Deleted

## 2014-08-17 NOTE — Progress Notes (Signed)
[place]  [phone] [phone]  ID: Robin Howard OB: [birth year]  MR#: [id number]  OVZ#:[id number]  Patient Care Team: Arnetha Courser, MD as PCP - General (Family Medicine) Rico Junker, RN as Registered Nurse Theodore Demark, RN as Registered Nurse Seeplaputhur [name], MD (General Surgery)  CHIEF COMPLAINT:  Chief Complaint  Patient presents with  . New Evaluation    breast cancer    INTERVAL HISTORY:  Patient is a 54 year old female recently diagnosed with a HER-2 positive breast cancer. She had not had a mammogram in greater than 5 years , but scheduled recently secondary to her sister being diagnosed with breast cancer.  Her mammogram revealed a suspicious lesion with supper biopsy confirming invasive cancer. Currently, she is anxious but otherwise feels well. She has no neurologic complaints. She denies any recent fevers or illnesses. She has a good appetite and denies weight loss. She has no chest pain or shortness of breath. She denies any nausea, vomiting, constipation, or diarrhea. She has no urinary complaints. Patient feels at her baseline and offers no further specific complaints.  REVIEW OF SYSTEMS:   Review of Systems  Constitutional: Negative.   Neurological: Negative.   Psychiatric/Behavioral: The patient is nervous/anxious.     As per HPI. Otherwise, a complete review of systems is negatve.  PAST MEDICAL HISTORY: Past Medical History  Diagnosis Date  . Major depressive disorder, recurrent   . HTN (hypertension)   . Overactive bladder   . Migraines   . Gastritis   . Hypothyroidism   . OSA (obstructive sleep apnea)     untreated due to lack of insurance  . Insomnia   . GERD (gastroesophageal reflux disease)   . Pericarditis 1997  . Detached retina 1997  . Chronic headache   . Morbid obesity   . NSTEMI (non-ST elevated myocardial infarction) 04/16/14    Due to demand- normal Cath 04/19/14  . Breast cancer   .  Bronchitis 04-2014  . Shortness of breath dyspnea     with exertion only  . Anemia     h/o  . Pericarditis 1997  . Family history of adverse reaction to anesthesia     pts dad has pseudocholinestrace deficieny    PAST SURGICAL HISTORY: Past Surgical History  Procedure Laterality Date  . Back surgery      L4-5  . Knee surgery    . Cesarean section    . Coronary angioplasty  April 2016  . Retinal detachment surgery Right 1997  . Foot surgery    . Breast lumpectomy with sentinel lymph node biopsy Left 08/13/2014    Procedure: BREAST LUMPECTOMY WITH SENTINEL LYMPH NODE BIOPSY;  Surgeon: Robert Bellow, MD;  Location: [facility] ORS;  Service: General;  Laterality: Left;    FAMILY HISTORY Family History  Problem Relation Age of Onset  . Alcohol abuse Mother   . Thyroid disease Mother   . Hypertension Mother   . COPD Mother   . Cancer Father     liposarcoma  . Congestive Heart Failure Father   . Depression Father   . Hypertension Father   . Cancer Maternal Uncle     Pancreatic cancer  . Heart disease Paternal Grandfather 41    MI  . Diabetes Other   . Cancer Sister     breast  . Breast cancer Sister 88       ADVANCED DIRECTIVES:    HEALTH MAINTENANCE: History  Substance Use Topics  .  Smoking status: Never Smoker   . Smokeless tobacco: Never Used  . Alcohol Use: No     Colonoscopy:  PAP:  Bone density:  Lipid panel:  Allergies  Allergen Reactions  . Reglan [Metoclopramide] Shortness Of Breath and Other (See Comments)    Dystonic Reaction  . Paxil [Paroxetine Hcl] Other (See Comments)    Blurred vision  . Ultram [Tramadol] Itching and Rash    Current Outpatient Prescriptions  Medication Sig Dispense Refill  . aspirin EC 81 MG tablet Take 81 mg by mouth daily.    Marland Kitchen escitalopram (LEXAPRO) 20 MG tablet Take 1 tablet (20 mg total) by mouth daily. Stop the escitalopram (Patient taking differently: Take 20 mg by mouth every morning. Stop the escitalopram) 30  tablet 2  . levothyroxine (SYNTHROID, LEVOTHROID) 100 MCG tablet Take 1 tablet (100 mcg total) by mouth daily. 30 tablet 11  . omeprazole (PRILOSEC) 40 MG capsule Take 1 capsule (40 mg total) by mouth daily. (Patient taking differently: Take 40 mg by mouth every morning. ) 30 capsule 3  . cholecalciferol (VITAMIN D) 1000 UNITS tablet Take 1,000 Units by mouth daily.    Marland Kitchen HYDROcodone-acetaminophen (NORCO) 5-325 MG per tablet Take 1-2 tablets by mouth every 4 (four) hours as needed for moderate pain. 30 tablet 0  . Multiple Vitamin (MULTIVITAMIN) capsule Take 1 capsule by mouth daily.    . vitamin B-12 (CYANOCOBALAMIN) 1000 MCG tablet Take 1,000 mcg by mouth daily.     No current facility-administered medications for this visit.    OBJECTIVE: Filed Vitals:   08/10/14 0922  BP: 147/74  Pulse: 64  Temp: 97.4 F (36.3 C)  Resp: 20     Body mass index is 39.85 kg/(m^2).    ECOG FS:0 - Asymptomatic  General: Well-developed, well-nourished, no acute distress. Eyes: Pink conjunctiva, anicteric sclera. HEENT: Normocephalic, moist mucous membranes, clear oropharnyx.  Breasts: Exam deferred today. Lungs: Clear to auscultation bilaterally. Heart: Regular rate and rhythm. No rubs, murmurs, or gallops. Abdomen: Soft, nontender, nondistended. No organomegaly noted, normoactive bowel sounds. Musculoskeletal: No edema, cyanosis, or clubbing. Neuro: Alert, answering all questions appropriately. Cranial nerves grossly intact. Skin: No rashes or petechiae noted. Psych: Normal affect. Lymphatics: No cervical, calvicular, axillary or inguinal LAD.   LAB RESULTS:  Lab Results  Component Value Date   NA 143 04/19/2014   K 4.5 04/19/2014   CL 105 04/19/2014   CO2 31 04/19/2014   GLUCOSE 94 04/19/2014   BUN 12 04/19/2014   CREATININE 0.93 04/19/2014   CALCIUM 8.9 04/19/2014   PROT 6.8 04/16/2014   ALBUMIN 3.9 04/16/2014   AST 23 04/16/2014   ALT 27 04/16/2014   ALKPHOS 88 04/16/2014    BILITOT 0.9 04/16/2014   GFRNONAA >60 04/19/2014   GFRAA >60 04/19/2014    Lab Results  Component Value Date   WBC 9.0 04/19/2014   NEUTROABS 6.5 04/17/2014   HGB 12.1 04/19/2014   HCT 37.6 04/19/2014   MCV 85 04/19/2014   PLT 250 04/19/2014     STUDIES: Nm Sentinel Node Inj-no Rpt (breast)  08/13/2014   CLINICAL DATA: left breast cancer   Sulfur colloid was injected intradermally by the nuclear medicine  technologist for breast cancer sentinel node localization.    Mm Digital Diagnostic Unilat L  08/03/2014   CLINICAL DATA:  54 year old female status post ultrasound-guided biopsy of a left breast mass at 5:30  EXAM: DIAGNOSTIC LEFT MAMMOGRAM POST ULTRASOUND BIOPSY  COMPARISON:  Previous exam(s).  FINDINGS: Mammographic  images were obtained following ultrasound guided biopsy of an indeterminate left breast mass at 5:30. Post biopsy mammogram demonstrates a ribbon shaped biopsy marker to be in the expected position within the lower, slightly outer left breast.  IMPRESSION: Satisfactory marker placement post ultrasound-guided left breast biopsy.  Final Assessment: Post Procedure Mammograms for Marker Placement   Electronically Signed   By: Pamelia Hoit M.D.   On: 08/03/2014 11:45   Mm Digital Screening  07/28/2014   CLINICAL DATA:  Screening.  EXAM: DIGITAL SCREENING BILATERAL MAMMOGRAM WITH CAD  COMPARISON:  Previous exam(s).  ACR Breast Density Category b: There are scattered areas of fibroglandular density.  FINDINGS: In the left breast, a possible mass warrants further evaluation. In the right breast, no findings suspicious for malignancy.  Images were processed with CAD.  IMPRESSION: Further evaluation is suggested for possible mass in the left breast.  RECOMMENDATION: Diagnostic mammogram and possibly ultrasound of the left breast. (Code:[id number])  The patient will be contacted regarding the findings, and additional imaging will be scheduled.  BI-RADS CATEGORY  0: Incomplete. Need  additional imaging evaluation and/or prior mammograms for comparison.   Electronically Signed   By: Lovey Newcomer M.D.   On: 07/28/2014 12:36   US Breast Complete Uni Left Inc Axilla  08/10/2014   Ultrasound examination was completed to assess visibility of the  previously placed biopsy clip. In the left breast at the 5:00 position, 6  cm from the nipple a complex mass consistent with the recently completed  biopsy measuring 1.3 x 1.3 x 1.36 cm was identified. This is within 1 cm  of the overlying skin. BI-RADS-6.   US Breast Ltd Uni Left Inc Axilla  07/29/2014   CLINICAL DATA:  54 year old female, callback from screening mammogram for possible left breast mass  EXAM: DIGITAL DIAGNOSTIC LEFT MAMMOGRAM WITH 3D TOMOSYNTHESIS WITH CAD  ULTRASOUND LEFT BREAST  COMPARISON:  07/28/2014, 03/23/2008  ACR Breast Density Category b: There are scattered areas of fibroglandular density.  FINDINGS: CC and MLO views of the left breast with tomosynthesis were performed. On the additional views, there is an irregular, hyperdense mass within the lower, slightly outer left breast, corresponding to the abnormality seen on screening mammogram. No additional abnormality is identified.  Mammographic images were processed with CAD.  On physical exam, no discrete mass is on the area of concern within the inferior left breast.  Targeted ultrasound is performed, showing an irregular, hypoechoic mass at 530, 4 cm from the nipple measuring 7 x 6 x 7 mm, corresponding to the mammographic finding. Targeted ultrasound of the left axilla demonstrates no suspicious appearing left axillary lymph nodes.  IMPRESSION: Suspicious left breast mass.  RECOMMENDATION: 1. Ultrasound-guided left breast biopsy. 2. Following discussion of the recommendation for the left breast biopsy with the patient, the patient stated that she has been feeling a lump within her superior right breast. The patient will be scheduled for a right diagnostic mammogram and  ultrasound on the day that she returns for her left breast biopsy.  I have discussed the findings and recommendations with the patient. Results were also provided in writing at the conclusion of the visit. If applicable, a reminder letter will be sent to the patient regarding the next appointment.  BI-RADS CATEGORY  4: Suspicious.   Electronically Signed   By: Pamelia Hoit M.D.   On: 07/29/2014 13:35   US Breast Ltd Uni Right Inc Axilla  08/03/2014   CLINICAL DATA:  54 year old female with palpable abnormality  of the right breast for 2 months. The patient notes that the area drains whitish material when squeezed. The patient also presents for biopsy of a previously evaluated left breast mass.  EXAM: DIGITAL DIAGNOSTIC RIGHT MAMMOGRAM WITH 3D TOMOSYNTHESIS WITH CAD  ULTRASOUND RIGHT BREAST  COMPARISON:  07/28/2014, 03/23/2008  ACR Breast Density Category c: The breast tissue is heterogeneously dense, which may obscure small masses.  FINDINGS: No suspicious mass, calcifications, or other abnormality is identified within the right breast. Specifically, no abnormality is noted underlying the palpable marker.  Mammographic images were processed with CAD.  On physical exam, no discrete mass is felt within the area of patient's concern within the superior right breast.  Targeted ultrasound is performed, showing an oval, hypoechoic area within the skin at 12:30, 9 cm from the nipple measuring 3 x 2 x 3 mm. A tract to the skin surface is noted. This finding likely represents a sebaceous cyst.  IMPRESSION: Benign skin finding. No mammographic or sonographic evidence of malignancy within the right breast.  RECOMMENDATION: 1. Routine screening of the right breast. 2. The patient will proceed with biopsy of an indeterminate left breast mass today.  I have discussed the findings and recommendations with the patient. Results were also provided in writing at the conclusion of the visit. If applicable, a reminder letter will be  sent to the patient regarding the next appointment.  BI-RADS CATEGORY  2: Benign.   Electronically Signed   By: Pamelia Hoit M.D.   On: 08/03/2014 11:03   Mm Diag Breast Tomo Uni Left  07/29/2014   CLINICAL DATA:  54 year old female, callback from screening mammogram for possible left breast mass  EXAM: DIGITAL DIAGNOSTIC LEFT MAMMOGRAM WITH 3D TOMOSYNTHESIS WITH CAD  ULTRASOUND LEFT BREAST  COMPARISON:  07/28/2014, 03/23/2008  ACR Breast Density Category b: There are scattered areas of fibroglandular density.  FINDINGS: CC and MLO views of the left breast with tomosynthesis were performed. On the additional views, there is an irregular, hyperdense mass within the lower, slightly outer left breast, corresponding to the abnormality seen on screening mammogram. No additional abnormality is identified.  Mammographic images were processed with CAD.  On physical exam, no discrete mass is on the area of concern within the inferior left breast.  Targeted ultrasound is performed, showing an irregular, hypoechoic mass at 530, 4 cm from the nipple measuring 7 x 6 x 7 mm, corresponding to the mammographic finding. Targeted ultrasound of the left axilla demonstrates no suspicious appearing left axillary lymph nodes.  IMPRESSION: Suspicious left breast mass.  RECOMMENDATION: 1. Ultrasound-guided left breast biopsy. 2. Following discussion of the recommendation for the left breast biopsy with the patient, the patient stated that she has been feeling a lump within her superior right breast. The patient will be scheduled for a right diagnostic mammogram and ultrasound on the day that she returns for her left breast biopsy.  I have discussed the findings and recommendations with the patient. Results were also provided in writing at the conclusion of the visit. If applicable, a reminder letter will be sent to the patient regarding the next appointment.  BI-RADS CATEGORY  4: Suspicious.   Electronically Signed   By: Pamelia Hoit M.D.    On: 07/29/2014 13:35   Mm Diag Breast Tomo Uni Right  08/03/2014   CLINICAL DATA:  54 year old female with palpable abnormality of the right breast for 2 months. The patient notes that the area drains whitish material when squeezed. The patient also presents  for biopsy of a previously evaluated left breast mass.  EXAM: DIGITAL DIAGNOSTIC RIGHT MAMMOGRAM WITH 3D TOMOSYNTHESIS WITH CAD  ULTRASOUND RIGHT BREAST  COMPARISON:  07/28/2014, 03/23/2008  ACR Breast Density Category c: The breast tissue is heterogeneously dense, which may obscure small masses.  FINDINGS: No suspicious mass, calcifications, or other abnormality is identified within the right breast. Specifically, no abnormality is noted underlying the palpable marker.  Mammographic images were processed with CAD.  On physical exam, no discrete mass is felt within the area of patient's concern within the superior right breast.  Targeted ultrasound is performed, showing an oval, hypoechoic area within the skin at 12:30, 9 cm from the nipple measuring 3 x 2 x 3 mm. A tract to the skin surface is noted. This finding likely represents a sebaceous cyst.  IMPRESSION: Benign skin finding. No mammographic or sonographic evidence of malignancy within the right breast.  RECOMMENDATION: 1. Routine screening of the right breast. 2. The patient will proceed with biopsy of an indeterminate left breast mass today.  I have discussed the findings and recommendations with the patient. Results were also provided in writing at the conclusion of the visit. If applicable, a reminder letter will be sent to the patient regarding the next appointment.  BI-RADS CATEGORY  2: Benign.   Electronically Signed   By: Pamelia Hoit M.D.   On: 08/03/2014 11:03   [place] Lt Breast Bx W Loc Dev 1st Lesion Img Bx Spec US Guide  08/05/2014   ADDENDUM REPORT: 08/05/2014 13:24  ADDENDUM: The pathology revealed invasive mammary carcinoma. This is found to be concordant with imaging findings. Nurse  navigator Koren Shiver RN relayed the results to the patient via phone. The patient is schedule to see Dr. Grayland Ormond on August 10 2014. Recommend surgical consultation.   Electronically Signed   By: Abelardo Diesel M.D.   On: 08/05/2014 13:24   08/05/2014   CLINICAL DATA:  54 year old female for ultrasound-guided biopsy of an indeterminate left breast mass at 5:30  EXAM: ULTRASOUND GUIDED LEFT BREAST CORE NEEDLE BIOPSY  COMPARISON:  Previous exam(s).  PROCEDURE: I met with the patient and we discussed the procedure of ultrasound-guided biopsy, including benefits and alternatives. We discussed the high likelihood of a successful procedure. We discussed the risks of the procedure including infection, bleeding, tissue injury, clip migration, and inadequate sampling. Informed written consent was given. The usual time-out protocol was performed immediately prior to the procedure.  Using sterile technique and 2% Lidocaine as local anesthetic, under direct ultrasound visualization, a 12 gauge vacuum-assisted device was used to perform biopsy of an indeterminate left breast mass at 5:30, 4 cm from the nipple using a medial to lateral approach. At the conclusion of the procedure, a ribbon shaped tissue marker clip was deployed into the biopsy cavity. Follow-up 2-view mammogram was performed and dictated separately.  IMPRESSION: Ultrasound-guided biopsy of an indeterminate left breast mass at 5:30. No apparent complications.  Electronically Signed: By: Pamelia Hoit M.D. 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  PLAN:    1. Breast cancer:   Given the fact that patient is HER-2 positive, she will benefit from adjuvant chemotherapy. Have recommended patient proceed with lumpectomy. Patient will also likely require adjuvant XRT. She does not require an aromatase inhibitor given the fact that her tumor is ER/PR negative. Patient will also require port placement prior to  initiating treatment. Return to clinic in approximately one to  2 weeks after her surgery to discuss her final pathology results and treatment planning.  2. Family history: Given a personal history of breast cancer as well as her sister with triple negative breast cancer in her 46s have recommended patient proceed with genetic testing which was performed today.  Patient expressed understanding and was in agreement with this plan. She also understands that She can call clinic at any time with any questions, concerns, or complaints.   Breast cancer, female   Staging form: Breast, AJCC 7th Edition     Pathologic stage from 08/17/2014: Stage IA (T1c, N0, cM0) - Signed by Lloyd Huger, MD on 08/17/2014   Lloyd Huger, MD   08/17/2014 1:55 PM

## 2014-08-17 NOTE — Telephone Encounter (Signed)
Called and talked with patient and informed her of her appointment with Dr. Baruch Gouty at 10:30 to discuss radiation therapy.  Her appointment to see Dr. Grayland Ormond is at 11:30.

## 2014-08-18 ENCOUNTER — Ambulatory Visit
Admission: RE | Admit: 2014-08-18 | Discharge: 2014-08-18 | Disposition: A | Discharge status: Home / Self Care | Payer: Medicaid Other | Source: Ambulatory Visit | Attending: Radiation Oncology | Admitting: Radiation Oncology

## 2014-08-18 ENCOUNTER — Encounter: Payer: Self-pay | Admitting: Radiation Oncology

## 2014-08-18 ENCOUNTER — Other Ambulatory Visit: Payer: Self-pay | Admitting: Oncology

## 2014-08-18 ENCOUNTER — Ambulatory Visit: Payer: Self-pay | Admitting: Oncology

## 2014-08-18 ENCOUNTER — Inpatient Hospital Stay: Payer: Medicaid Other | Attending: Oncology | Admitting: Oncology

## 2014-08-18 ENCOUNTER — Telehealth: Payer: Self-pay | Admitting: *Deleted

## 2014-08-18 VITALS — Temp 98.7°F | Resp 21 | Ht 66.0 in | Wt 241.4 lb

## 2014-08-18 DIAGNOSIS — Z8669 Personal history of other diseases of the nervous system and sense organs: Secondary | ICD-10-CM | POA: Diagnosis not present

## 2014-08-18 DIAGNOSIS — E669 Obesity, unspecified: Secondary | ICD-10-CM | POA: Insufficient documentation

## 2014-08-18 DIAGNOSIS — Z5111 Encounter for antineoplastic chemotherapy: Secondary | ICD-10-CM | POA: Diagnosis present

## 2014-08-18 DIAGNOSIS — I10 Essential (primary) hypertension: Secondary | ICD-10-CM

## 2014-08-18 DIAGNOSIS — C50512 Malignant neoplasm of lower-outer quadrant of left female breast: Secondary | ICD-10-CM | POA: Diagnosis not present

## 2014-08-18 DIAGNOSIS — G4700 Insomnia, unspecified: Secondary | ICD-10-CM | POA: Insufficient documentation

## 2014-08-18 DIAGNOSIS — Z171 Estrogen receptor negative status [ER-]: Secondary | ICD-10-CM | POA: Insufficient documentation

## 2014-08-18 DIAGNOSIS — Z7982 Long term (current) use of aspirin: Secondary | ICD-10-CM | POA: Insufficient documentation

## 2014-08-18 DIAGNOSIS — K219 Gastro-esophageal reflux disease without esophagitis: Secondary | ICD-10-CM | POA: Diagnosis not present

## 2014-08-18 DIAGNOSIS — C50912 Malignant neoplasm of unspecified site of left female breast: Secondary | ICD-10-CM

## 2014-08-18 DIAGNOSIS — I252 Old myocardial infarction: Secondary | ICD-10-CM | POA: Diagnosis not present

## 2014-08-18 DIAGNOSIS — F329 Major depressive disorder, single episode, unspecified: Secondary | ICD-10-CM | POA: Insufficient documentation

## 2014-08-18 DIAGNOSIS — R0609 Other forms of dyspnea: Secondary | ICD-10-CM | POA: Diagnosis not present

## 2014-08-18 DIAGNOSIS — N3281 Overactive bladder: Secondary | ICD-10-CM

## 2014-08-18 DIAGNOSIS — Z803 Family history of malignant neoplasm of breast: Secondary | ICD-10-CM | POA: Insufficient documentation

## 2014-08-18 DIAGNOSIS — F419 Anxiety disorder, unspecified: Secondary | ICD-10-CM | POA: Insufficient documentation

## 2014-08-18 DIAGNOSIS — Z79899 Other long term (current) drug therapy: Secondary | ICD-10-CM | POA: Insufficient documentation

## 2014-08-18 DIAGNOSIS — I319 Disease of pericardium, unspecified: Secondary | ICD-10-CM | POA: Insufficient documentation

## 2014-08-18 DIAGNOSIS — E039 Hypothyroidism, unspecified: Secondary | ICD-10-CM | POA: Insufficient documentation

## 2014-08-18 DIAGNOSIS — Z510 Encounter for antineoplastic radiation therapy: Secondary | ICD-10-CM | POA: Insufficient documentation

## 2014-08-18 DIAGNOSIS — G4733 Obstructive sleep apnea (adult) (pediatric): Secondary | ICD-10-CM | POA: Insufficient documentation

## 2014-08-18 DIAGNOSIS — G4730 Sleep apnea, unspecified: Secondary | ICD-10-CM | POA: Diagnosis not present

## 2014-08-18 NOTE — Telephone Encounter (Signed)
error 

## 2014-08-18 NOTE — Consult Note (Signed)
Except an outstanding is perfect of Radiation Oncology NEW PATIENT EVALUATION  Name: Robin Howard  MRN: [id number]  Date:   08/18/2014     DOB: 06-15-1960   This 54 y.o. female patient presents to the clinic for initial evaluation of breast cancer stage I (T1 CN 0 M0) ER/PR negative HER-2/neu overexpressed status post wide local excision and sentinel node biopsy.  REFERRING PHYSICIAN: Arnetha Courser, MD  CHIEF COMPLAINT:  Chief Complaint  Patient presents with  . Breast Cancer    DIAGNOSIS: The encounter diagnosis was Malignant neoplasm of female breast, left.   PREVIOUS INVESTIGATIONS:  Mammograms and ultrasound reviewed Surgical pathology report reviewed Clinical notes reviewed Case presented at breast cancer conference  HPI: patient is a 54 year old femalewhose sister was recently diagnosed with breast cancer which prompted her to obtain mammograms. An irregularity was seen in the inferior portion of the left breast measuring 7 mm confirmed on ultrasound. It was suspicious for malignancy and she underwent biopsy which was positive for ER/PR negative HER-2/neu overexpressed invasive mammary carcinoma.she went on to have a wide local excision showing a 1.2 cm invasive mammary carcinoma overall score of 3. Margins were clear at 10 mm.2 sentinel lymph nodes were negative for metastatic disease. She has done well postoperatively still little tightness in her axilla. She specifically denies breast tenderness cough or bone pain. She's been seen today by both medical oncology and radiation oncology for opinion. Her case was presented at our weekly tumor conference and medical oncology favors systemic chemotherapy with Herceptin treatment. We discussed the possibility of prior to chemotherapy doing accelerated partial breast irradiation.  PLANNED TREATMENT REGIMEN: possible accelerated partial breast radiation  PAST MEDICAL HISTORY:  has a past medical history of Major depressive disorder,  recurrent; HTN (hypertension); Overactive bladder; Migraines; Gastritis; Hypothyroidism; OSA (obstructive sleep apnea); Insomnia; GERD (gastroesophageal reflux disease); Pericarditis (1997); Detached retina (1997); Chronic headache; Morbid obesity; NSTEMI (non-ST elevated myocardial infarction) (04/16/14); Breast cancer; Bronchitis (04-2014); Shortness of breath dyspnea; Anemia; Pericarditis (1997); and Family history of adverse reaction to anesthesia.    PAST SURGICAL HISTORY:  Past Surgical History  Procedure Laterality Date  . Back surgery      L4-5  . Knee surgery    . Cesarean section    . Coronary angioplasty  April 2016  . Retinal detachment surgery Right 1997  . Foot surgery    . Breast lumpectomy with sentinel lymph node biopsy Left 08/13/2014    Procedure: BREAST LUMPECTOMY WITH SENTINEL LYMPH NODE BIOPSY;  Surgeon: Robert Bellow, MD;  Location: [facility] ORS;  Service: General;  Laterality: Left;    FAMILY HISTORY: family history includes Alcohol abuse in her mother; Breast cancer (age of onset: 107) in her sister; COPD in her mother; Cancer in her father, maternal uncle, and sister; Congestive Heart Failure in her father; Depression in her father; Diabetes in her other; Heart disease (age of onset: 26) in her paternal grandfather; Hypertension in her father and mother; Thyroid disease in her mother.  SOCIAL HISTORY:  reports that she has never smoked. She has never used smokeless tobacco. She reports that she does not drink alcohol or use illicit drugs.  ALLERGIES: Reglan; Paxil; and Ultram  MEDICATIONS:  Current Outpatient Prescriptions  Medication Sig Dispense Refill  . aspirin EC 81 MG tablet Take 81 mg by mouth daily.    . cholecalciferol (VITAMIN D) 1000 UNITS tablet Take 1,000 Units by mouth daily.    Marland Kitchen escitalopram (LEXAPRO) 20 MG tablet Take  1 tablet (20 mg total) by mouth daily. Stop the escitalopram (Patient taking differently: Take 20 mg by mouth every morning. Stop the  escitalopram) 30 tablet 2  . HYDROcodone-acetaminophen (NORCO) 5-325 MG per tablet Take 1-2 tablets by mouth every 4 (four) hours as needed for moderate pain. 30 tablet 0  . levothyroxine (SYNTHROID, LEVOTHROID) 100 MCG tablet Take 1 tablet (100 mcg total) by mouth daily. 30 tablet 11  . Multiple Vitamin (MULTIVITAMIN) capsule Take 1 capsule by mouth daily.    Marland Kitchen omeprazole (PRILOSEC) 40 MG capsule Take 1 capsule (40 mg total) by mouth daily. (Patient taking differently: Take 40 mg by mouth every morning. ) 30 capsule 3  . vitamin B-12 (CYANOCOBALAMIN) 1000 MCG tablet Take 1,000 mcg by mouth daily.     No current facility-administered medications for this encounter.    ECOG PERFORMANCE STATUS:  0 - Asymptomatic  REVIEW OF SYSTEMS:  Patient denies any weight loss, fatigue, weakness, fever, chills or night sweats. Patient denies any loss of vision, blurred vision. Patient denies any ringing  of the ears or hearing loss. No irregular heartbeat. Patient denies heart murmur or history of fainting. Patient denies any chest pain or pain radiating to her upper extremities. Patient denies any shortness of breath, difficulty breathing at night, cough or hemoptysis. Patient denies any swelling in the lower legs. Patient denies any nausea vomiting, vomiting of blood, or coffee ground material in the vomitus. Patient denies any stomach pain. Patient states has had normal bowel movements no significant constipation or diarrhea. Patient denies any dysuria, hematuria or significant nocturia. Patient denies any problems walking, swelling in the joints or loss of balance. Patient denies any skin changes, loss of hair or loss of weight. Patient denies any excessive worrying or anxiety or significant depression. Patient denies any problems with insomnia. Patient denies excessive thirst, polyuria, polydipsia. Patient denies any swollen glands, patient denies easy bruising or easy bleeding. Patient denies any recent  infections, allergies or URI. Patient "s visual fields have not changed significantly in recent time.    PHYSICAL EXAM: Temp(Src) 98.7 F (37.1 C)  Resp 21  Ht '5\' 6"'  (1.676 m)  Wt 241 lb 6.5 oz (109.5 kg)  BMI 38.98 kg/m2  LMP 07/27/2012 Well-developed obese female in NAD. She is status post wide local excision of the left breast. Incision is well-healed. Axillary incision is also well-healed. No dominant mass or nodularity is noted in either breast in 2 positions examined. No axillary or supraclavicular adenopathy is appreciated. Well-developed well-nourished patient in NAD. HEENT reveals PERLA, EOMI, discs not visualized.  Oral cavity is clear. No oral mucosal lesions are identified. Neck is clear without evidence of cervical or supraclavicular adenopathy. Lungs are clear to A&P. Cardiac examination is essentially unremarkable with regular rate and rhythm without murmur rub or thrill. Abdomen is benign with no organomegaly or masses noted. Motor sensory and DTR levels are equal and symmetric in the upper and lower extremities. Cranial nerves II through XII are grossly intact. Proprioception is intact. No peripheral adenopathy or edema is identified. No motor or sensory levels are noted. Crude visual fields are within normal range.   LABORATORY DATA: pathology reports reviewed    RADIOLOGY RESULTS:mammograms and ultrasound reviewed   IMPRESSION: stage I ER/PR negative HER-2/neu overexpressed invasive mammary carcinoma the left breast status post wide local excision and sentinel node biopsy in 54 year old female good candidate for accelerated partial breast irradiation.  PLAN: based on the location of her tumor as well as  her obesity I believe she would be a good candidate for accelerated partial breast irradiation. I discussed that with the surgeon. I've also discussed whole breast radiation. Risks and benefits of both were described in detail to the patient. Risks and benefits of treatment  including skin reaction, fatigue, thickening of the breast, alteration of blood counts all were described in detail to the patient. She will have attempted MammoSite balloon catheter placement in about a week or so followed up by BrachyVision treatment planning. If balloon cannot be placed will revert to whole breast radiation which will be sequenced in after her chemotherapy. Patient seems to come and my treatment plan well and rational for accelerated partial breast irradiation.  I would like to take this opportunity for allowing me to participate in the care of your patient.Armstead Peaks., MD

## 2014-08-19 ENCOUNTER — Telehealth: Payer: Self-pay | Admitting: *Deleted

## 2014-08-19 NOTE — Telephone Encounter (Signed)
Patient notified that mammosite will need to be placed in the O.R. due to insurance. She verbalizes understanding.   Also, states that she will need chemo and treatments will begin on 09-14-14.

## 2014-08-23 NOTE — Telephone Encounter (Signed)
I reached voicemail I just returned from being out of town all last week, see what she has been going through Do NOT go up on the escitalopram; I don't want her any higher than 20 mg We'll try to catch up today or tomorrow by phone

## 2014-08-23 NOTE — Progress Notes (Signed)
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  No chief complaint on file.   INTERVAL HISTORY:  Patient returns to clinic today to discuss her pathology results and treatment planning. She continues to be anxious, but otherwise feels well. She has no neurologic complaints. She denies any recent fevers or illnesses. She has a good appetite and denies weight loss. She has no chest pain or shortness of breath. She denies any nausea, vomiting, constipation, or diarrhea. She has no urinary complaints. Patient feels at her baseline and offers no further specific complaints.  REVIEW OF SYSTEMS:   Review of Systems  Constitutional: Negative.   Neurological: Negative.   Psychiatric/Behavioral: The patient is nervous/anxious.     As per HPI. Otherwise, a complete review of systems is negatve.  PAST MEDICAL HISTORY: Past Medical History  Diagnosis Date  . Major depressive disorder, recurrent   . HTN (hypertension)   . Overactive bladder   . Migraines   . Gastritis   . Hypothyroidism   . OSA (obstructive sleep apnea)     untreated due to lack of insurance  . Insomnia   . GERD (gastroesophageal reflux disease)   . Pericarditis 1997  . Detached retina 1997  . Chronic headache   . Morbid obesity   . NSTEMI (non-ST elevated myocardial infarction) 04/16/14    Due to demand- normal Cath 04/19/14  . Breast cancer   . Bronchitis 04-2014  . Shortness of breath dyspnea     with exertion only  . Anemia     h/o  . Pericarditis 1997  . Family history of adverse reaction to anesthesia     pts dad has pseudocholinestrace deficieny     PAST SURGICAL HISTORY: Past Surgical History  Procedure Laterality Date  . Back surgery      L4-5  . Knee surgery    . Cesarean section    . Coronary angioplasty  April 2016  . Retinal detachment surgery Right 1997  . Foot surgery    . Breast lumpectomy with sentinel lymph node biopsy Left 08/13/2014    Procedure: BREAST LUMPECTOMY WITH SENTINEL LYMPH NODE BIOPSY;  Surgeon: Robert Bellow, MD;  Location: [facility] ORS;  Service: General;  Laterality: Left;    FAMILY HISTORY Family History  Problem Relation Age of Onset  . Alcohol abuse Mother   . Thyroid disease Mother   . Hypertension Mother   . COPD Mother   . Cancer Father     liposarcoma  . Congestive Heart Failure Father   . Depression Father   . Hypertension Father   . Cancer Maternal Uncle     Pancreatic cancer  . Heart disease Paternal Grandfather 41    MI  . Diabetes Other   . Cancer Sister     breast  . Breast cancer Sister 31       ADVANCED DIRECTIVES:    HEALTH MAINTENANCE: History  Substance Use Topics  . Smoking status: Never Smoker   . Smokeless tobacco: Never Used  . Alcohol Use: No     Colonoscopy:  PAP:  Bone density:  Lipid panel:  Allergies  Allergen Reactions  . Reglan [  Metoclopramide] Shortness Of Breath and Other (See Comments)    Dystonic Reaction  . Paxil [Paroxetine Hcl] Other (See Comments)    Blurred vision  . Ultram [Tramadol] Itching and Rash    Current Outpatient Prescriptions  Medication Sig Dispense Refill  . aspirin EC 81 MG tablet Take 81 mg by mouth daily.    . cholecalciferol (VITAMIN D) 1000 UNITS tablet Take 1,000 Units by mouth daily.    Marland Kitchen escitalopram (LEXAPRO) 20 MG tablet Take 1 tablet (20 mg total) by mouth daily. Stop the escitalopram (Patient taking differently: Take 20 mg by mouth every morning. Stop the escitalopram) 30 tablet 2  . HYDROcodone-acetaminophen (NORCO) 5-325 MG per tablet Take 1-2 tablets by mouth every 4 (four) hours as needed for  moderate pain. 30 tablet 0  . levothyroxine (SYNTHROID, LEVOTHROID) 100 MCG tablet Take 1 tablet (100 mcg total) by mouth daily. 30 tablet 11  . Multiple Vitamin (MULTIVITAMIN) capsule Take 1 capsule by mouth daily.    Marland Kitchen omeprazole (PRILOSEC) 40 MG capsule Take 1 capsule (40 mg total) by mouth daily. (Patient taking differently: Take 40 mg by mouth every morning. ) 30 capsule 3  . vitamin B-12 (CYANOCOBALAMIN) 1000 MCG tablet Take 1,000 mcg by mouth daily.     No current facility-administered medications for this visit.    OBJECTIVE: There were no vitals filed for this visit.   There is no weight on file to calculate BMI.    ECOG FS:0 - Asymptomatic  General: Well-developed, well-nourished, no acute distress. Eyes: Pink conjunctiva, anicteric sclera. Breasts: Exam deferred today. Lungs: Clear to auscultation bilaterally. Heart: Regular rate and rhythm. No rubs, murmurs, or gallops. Abdomen: Soft, nontender, nondistended. No organomegaly noted, normoactive bowel sounds. Musculoskeletal: No edema, cyanosis, or clubbing. Neuro: Alert, answering all questions appropriately. Cranial nerves grossly intact. Skin: No rashes or petechiae noted. Psych: Normal affect.   LAB RESULTS:  Lab Results  Component Value Date   NA 143 04/19/2014   K 4.5 04/19/2014   CL 105 04/19/2014   CO2 31 04/19/2014   GLUCOSE 94 04/19/2014   BUN 12 04/19/2014   CREATININE 0.93 04/19/2014   CALCIUM 8.9 04/19/2014   PROT 6.8 04/16/2014   ALBUMIN 3.9 04/16/2014   AST 23 04/16/2014   ALT 27 04/16/2014   ALKPHOS 88 04/16/2014   BILITOT 0.9 04/16/2014   GFRNONAA >60 04/19/2014   GFRAA >60 04/19/2014    Lab Results  Component Value Date   WBC 9.0 04/19/2014   NEUTROABS 6.5 04/17/2014   HGB 12.1 04/19/2014   HCT 37.6 04/19/2014   MCV 85 04/19/2014   PLT 250 04/19/2014     STUDIES: Nm Sentinel Node Inj-no Rpt (breast)  08/13/2014   CLINICAL DATA: left breast cancer   Sulfur colloid was injected  intradermally by the nuclear medicine  technologist for breast cancer sentinel node localization.    Mm Digital Diagnostic Unilat L  08/03/2014   CLINICAL DATA:  54 year old female status post ultrasound-guided biopsy of a left breast mass at 5:30  EXAM: DIAGNOSTIC LEFT MAMMOGRAM POST ULTRASOUND BIOPSY  COMPARISON:  Previous exam(s).  FINDINGS: Mammographic images were obtained following ultrasound guided biopsy of an indeterminate left breast mass at 5:30. Post biopsy mammogram demonstrates a ribbon shaped biopsy marker to be in the expected position within the lower, slightly outer left breast.  IMPRESSION: Satisfactory marker placement post ultrasound-guided left breast biopsy.  Final Assessment: Post Procedure Mammograms for Marker Placement   Electronically Signed   By: Junie Panning  Brigitte Pulse M.D.   On: 08/03/2014 11:45   Mm Digital Screening  07/28/2014   CLINICAL DATA:  Screening.  EXAM: DIGITAL SCREENING BILATERAL MAMMOGRAM WITH CAD  COMPARISON:  Previous exam(s).  ACR Breast Density Category b: There are scattered areas of fibroglandular density.  FINDINGS: In the left breast, a possible mass warrants further evaluation. In the right breast, no findings suspicious for malignancy.  Images were processed with CAD.  IMPRESSION: Further evaluation is suggested for possible mass in the left breast.  RECOMMENDATION: Diagnostic mammogram and possibly ultrasound of the left breast. (Code:[id number])  The patient will be contacted regarding the findings, and additional imaging will be scheduled.  BI-RADS CATEGORY  0: Incomplete. Need additional imaging evaluation and/or prior mammograms for comparison.   Electronically Signed   By: Lovey Newcomer M.D.   On: 07/28/2014 12:36   US Breast Complete Uni Left Inc Axilla  08/10/2014   Ultrasound examination was completed to assess visibility of the  previously placed biopsy clip. In the left breast at the 5:00 position, 6  cm from the nipple a complex mass consistent with the  recently completed  biopsy measuring 1.3 x 1.3 x 1.36 cm was identified. This is within 1 cm  of the overlying skin. BI-RADS-6.   US Breast Ltd Uni Left Inc Axilla  07/29/2014   CLINICAL DATA:  54 year old female, callback from screening mammogram for possible left breast mass  EXAM: DIGITAL DIAGNOSTIC LEFT MAMMOGRAM WITH 3D TOMOSYNTHESIS WITH CAD  ULTRASOUND LEFT BREAST  COMPARISON:  07/28/2014, 03/23/2008  ACR Breast Density Category b: There are scattered areas of fibroglandular density.  FINDINGS: CC and MLO views of the left breast with tomosynthesis were performed. On the additional views, there is an irregular, hyperdense mass within the lower, slightly outer left breast, corresponding to the abnormality seen on screening mammogram. No additional abnormality is identified.  Mammographic images were processed with CAD.  On physical exam, no discrete mass is on the area of concern within the inferior left breast.  Targeted ultrasound is performed, showing an irregular, hypoechoic mass at 530, 4 cm from the nipple measuring 7 x 6 x 7 mm, corresponding to the mammographic finding. Targeted ultrasound of the left axilla demonstrates no suspicious appearing left axillary lymph nodes.  IMPRESSION: Suspicious left breast mass.  RECOMMENDATION: 1. Ultrasound-guided left breast biopsy. 2. Following discussion of the recommendation for the left breast biopsy with the patient, the patient stated that she has been feeling a lump within her superior right breast. The patient will be scheduled for a right diagnostic mammogram and ultrasound on the day that she returns for her left breast biopsy.  I have discussed the findings and recommendations with the patient. Results were also provided in writing at the conclusion of the visit. If applicable, a reminder letter will be sent to the patient regarding the next appointment.  BI-RADS CATEGORY  4: Suspicious.   Electronically Signed   By: Pamelia Hoit M.D.   On: 07/29/2014  13:35   US Breast Ltd Uni Right Inc Axilla  08/03/2014   CLINICAL DATA:  54 year old female with palpable abnormality of the right breast for 2 months. The patient notes that the area drains whitish material when squeezed. The patient also presents for biopsy of a previously evaluated left breast mass.  EXAM: DIGITAL DIAGNOSTIC RIGHT MAMMOGRAM WITH 3D TOMOSYNTHESIS WITH CAD  ULTRASOUND RIGHT BREAST  COMPARISON:  07/28/2014, 03/23/2008  ACR Breast Density Category c: The breast tissue is heterogeneously dense, which may  obscure small masses.  FINDINGS: No suspicious mass, calcifications, or other abnormality is identified within the right breast. Specifically, no abnormality is noted underlying the palpable marker.  Mammographic images were processed with CAD.  On physical exam, no discrete mass is felt within the area of patient's concern within the superior right breast.  Targeted ultrasound is performed, showing an oval, hypoechoic area within the skin at 12:30, 9 cm from the nipple measuring 3 x 2 x 3 mm. A tract to the skin surface is noted. This finding likely represents a sebaceous cyst.  IMPRESSION: Benign skin finding. No mammographic or sonographic evidence of malignancy within the right breast.  RECOMMENDATION: 1. Routine screening of the right breast. 2. The patient will proceed with biopsy of an indeterminate left breast mass today.  I have discussed the findings and recommendations with the patient. Results were also provided in writing at the conclusion of the visit. If applicable, a reminder letter will be sent to the patient regarding the next appointment.  BI-RADS CATEGORY  2: Benign.   Electronically Signed   By: Pamelia Hoit M.D.   On: 08/03/2014 11:03   Mm Diag Breast Tomo Uni Left  07/29/2014   CLINICAL DATA:  54 year old female, callback from screening mammogram for possible left breast mass  EXAM: DIGITAL DIAGNOSTIC LEFT MAMMOGRAM WITH 3D TOMOSYNTHESIS WITH CAD  ULTRASOUND LEFT BREAST   COMPARISON:  07/28/2014, 03/23/2008  ACR Breast Density Category b: There are scattered areas of fibroglandular density.  FINDINGS: CC and MLO views of the left breast with tomosynthesis were performed. On the additional views, there is an irregular, hyperdense mass within the lower, slightly outer left breast, corresponding to the abnormality seen on screening mammogram. No additional abnormality is identified.  Mammographic images were processed with CAD.  On physical exam, no discrete mass is on the area of concern within the inferior left breast.  Targeted ultrasound is performed, showing an irregular, hypoechoic mass at 530, 4 cm from the nipple measuring 7 x 6 x 7 mm, corresponding to the mammographic finding. Targeted ultrasound of the left axilla demonstrates no suspicious appearing left axillary lymph nodes.  IMPRESSION: Suspicious left breast mass.  RECOMMENDATION: 1. Ultrasound-guided left breast biopsy. 2. Following discussion of the recommendation for the left breast biopsy with the patient, the patient stated that she has been feeling a lump within her superior right breast. The patient will be scheduled for a right diagnostic mammogram and ultrasound on the day that she returns for her left breast biopsy.  I have discussed the findings and recommendations with the patient. Results were also provided in writing at the conclusion of the visit. If applicable, a reminder letter will be sent to the patient regarding the next appointment.  BI-RADS CATEGORY  4: Suspicious.   Electronically Signed   By: Pamelia Hoit M.D.   On: 07/29/2014 13:35   Mm Diag Breast Tomo Uni Right  08/03/2014   CLINICAL DATA:  54 year old female with palpable abnormality of the right breast for 2 months. The patient notes that the area drains whitish material when squeezed. The patient also presents for biopsy of a previously evaluated left breast mass.  EXAM: DIGITAL DIAGNOSTIC RIGHT MAMMOGRAM WITH 3D TOMOSYNTHESIS WITH CAD   ULTRASOUND RIGHT BREAST  COMPARISON:  07/28/2014, 03/23/2008  ACR Breast Density Category c: The breast tissue is heterogeneously dense, which may obscure small masses.  FINDINGS: No suspicious mass, calcifications, or other abnormality is identified within the right breast. Specifically, no abnormality is  noted underlying the palpable marker.  Mammographic images were processed with CAD.  On physical exam, no discrete mass is felt within the area of patient's concern within the superior right breast.  Targeted ultrasound is performed, showing an oval, hypoechoic area within the skin at 12:30, 9 cm from the nipple measuring 3 x 2 x 3 mm. A tract to the skin surface is noted. This finding likely represents a sebaceous cyst.  IMPRESSION: Benign skin finding. No mammographic or sonographic evidence of malignancy within the right breast.  RECOMMENDATION: 1. Routine screening of the right breast. 2. The patient will proceed with biopsy of an indeterminate left breast mass today.  I have discussed the findings and recommendations with the patient. Results were also provided in writing at the conclusion of the visit. If applicable, a reminder letter will be sent to the patient regarding the next appointment.  BI-RADS CATEGORY  2: Benign.   Electronically Signed   By: Pamelia Hoit M.D.   On: 08/03/2014 11:03   [place] Lt Breast Bx W Loc Dev 1st Lesion Img Bx Spec US Guide  08/05/2014   ADDENDUM REPORT: 08/05/2014 13:24  ADDENDUM: The pathology revealed invasive mammary carcinoma. This is found to be concordant with imaging findings. Nurse navigator Koren Shiver RN relayed the results to the patient via phone. The patient is schedule to see Dr. Grayland Ormond on August 10 2014. Recommend surgical consultation.   Electronically Signed   By: Abelardo Diesel M.D.   On: 08/05/2014 13:24   08/05/2014   CLINICAL DATA:  54 year old female for ultrasound-guided biopsy of an indeterminate left breast mass at 5:30  EXAM: ULTRASOUND GUIDED LEFT  BREAST CORE NEEDLE BIOPSY  COMPARISON:  Previous exam(s).  PROCEDURE: I met with the patient and we discussed the procedure of ultrasound-guided biopsy, including benefits and alternatives. We discussed the high likelihood of a successful procedure. We discussed the risks of the procedure including infection, bleeding, tissue injury, clip migration, and inadequate sampling. Informed written consent was given. The usual time-out protocol was performed immediately prior to the procedure.  Using sterile technique and 2% Lidocaine as local anesthetic, under direct ultrasound visualization, a 12 gauge vacuum-assisted device was used to perform biopsy of an indeterminate left breast mass at 5:30, 4 cm from the nipple using a medial to lateral approach. At the conclusion of the procedure, a ribbon shaped tissue marker clip was deployed into the biopsy cavity. Follow-up 2-view mammogram was performed and dictated separately.  IMPRESSION: Ultrasound-guided biopsy of an indeterminate left breast mass at 5:30. No apparent complications.  Electronically Signed: By: Pamelia Hoit M.D. 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  PLAN:    1. Breast cancer: Given the fact that patient is HER-2 positive, she will benefit from adjuvant chemotherapy. Patient has now had a lumpectomy and will proceed with MammoSite radiation prior to initiating adjuvant chemotherapy with Taxotere, carboplatinum, and Herceptin. Prior to initiating treatment she will require a MUGA and port placement. Return to clinic on September 07, 2014 to initiate cycle 1 of 6 of treatment. Patient also will require a total of 18 treatments of Herceptin every 3 weeks for a total of one year. She does not require an aromatase inhibitor given the fact that her tumor is ER/PR negative.  2. Family history: Given a personal history of breast cancer as well as her sister with triple negative breast cancer in her 70s  have recommended patient proceed  with genetic testing.  Approximately 30 minutes was spent in discussion and consultation.  Patient expressed understanding and was in agreement with this plan. She also understands that She can call clinic at any time with any questions, concerns, or complaints.   Breast cancer, female   Staging form: Breast, AJCC 7th Edition     Pathologic stage from 08/17/2014: Stage IA (T1c, N0, cM0) - Signed by Lloyd Huger, MD on 08/17/2014   Lloyd Huger, MD   08/23/2014 5:07 PM

## 2014-08-23 NOTE — Telephone Encounter (Signed)
I talked with patient; she is going to have surgery again on Monday; they are going to put a balloon in the breast and administer treatments that way, as well as put in port She is already at the max dose of escitalopram; asked if anything else needed (thinking benzo) and she said nothing yet She is not having panic; son is gone for three weeks; feeling alone; discussed programs, encouraged her to work with cancer center to see if therapist or peer program for support Sister also had cancer a few months before, they can support each other I am here if needed

## 2014-08-25 ENCOUNTER — Ambulatory Visit: Payer: Self-pay | Admitting: Oncology

## 2014-08-26 ENCOUNTER — Ambulatory Visit: Payer: PRIVATE HEALTH INSURANCE

## 2014-08-26 ENCOUNTER — Encounter: Payer: Self-pay | Admitting: General Surgery

## 2014-08-26 ENCOUNTER — Ambulatory Visit (INDEPENDENT_AMBULATORY_CARE_PROVIDER_SITE_OTHER): Payer: Medicaid Other | Admitting: General Surgery

## 2014-08-26 ENCOUNTER — Telehealth: Payer: Self-pay | Admitting: *Deleted

## 2014-08-26 ENCOUNTER — Encounter
Admission: RE | Admit: 2014-08-26 | Discharge: 2014-08-26 | Disposition: A | Discharge status: Home / Self Care | Payer: Medicaid Other | Source: Ambulatory Visit | Attending: General Surgery | Admitting: General Surgery

## 2014-08-26 VITALS — BP 142/82 | HR 82 | Resp 16 | Ht 66.0 in | Wt 239.0 lb

## 2014-08-26 DIAGNOSIS — Z452 Encounter for adjustment and management of vascular access device: Secondary | ICD-10-CM

## 2014-08-26 DIAGNOSIS — C50912 Malignant neoplasm of unspecified site of left female breast: Secondary | ICD-10-CM | POA: Diagnosis not present

## 2014-08-26 DIAGNOSIS — Z01818 Encounter for other preprocedural examination: Secondary | ICD-10-CM | POA: Diagnosis present

## 2014-08-26 MED ORDER — CEFADROXIL 500 MG PO CAPS: 500.0000 mg | ORAL_CAPSULE | Freq: Two times a day (BID) | ORAL | Status: DC

## 2014-08-26 NOTE — Patient Instructions (Addendum)
Scheduled for mammosite 08/30/14. Patient care kit given to patient.  Instructed no showers, sponge bath while mammosite in place, take antibiotic. Follow up with [place] as arranged. Discussed wearing your bra for support at all times.  Implanted [facility] Guide An implanted port is a type of central line that is placed under the skin. Central lines are used to provide IV access when treatment or nutrition needs to be given through a person's veins. Implanted ports are used for long-term IV access. An implanted port may be placed because:   You need IV medicine that would be irritating to the small veins in your hands or arms.   You need long-term IV medicines, such as antibiotics.   You need IV nutrition for a long period.   You need frequent blood draws for lab tests.   You need dialysis.  Implanted ports are usually placed in the chest area, but they can also be placed in the upper arm, the abdomen, or the leg. An implanted port has two main parts:   Reservoir. The reservoir is round and will appear as a small, raised area under your skin. The reservoir is the part where a needle is inserted to give medicines or draw blood.   Catheter. The catheter is a thin, flexible tube that extends from the reservoir. The catheter is placed into a large vein. Medicine that is inserted into the reservoir goes into the catheter and then into the vein.  HOW WILL I CARE FOR MY INCISION SITE? Do not get the incision site wet. Bathe or shower as directed by your health care provider.  HOW IS MY PORT ACCESSED? Special steps must be taken to access the port:   Before the port is accessed, a numbing cream can be placed on the skin. This helps numb the skin over the port site.   Your health care provider uses a sterile technique to access the port.  Your health care provider must put on a mask and sterile gloves.  The skin over your port is cleaned carefully with an antiseptic and allowed to  dry.  The port is gently pinched between sterile gloves, and a needle is inserted into the port.  Only "non-coring" port needles should be used to access the port. Once the port is accessed, a blood return should be checked. This helps ensure that the port is in the vein and is not clogged.   If your port needs to remain accessed for a constant infusion, a clear (transparent) bandage will be placed over the needle site. The bandage and needle will need to be changed every week, or as directed by your health care provider.   Keep the bandage covering the needle clean and dry. Do not get it wet. Follow your health care provider's instructions on how to take a shower or bath while the port is accessed.   If your port does not need to stay accessed, no bandage is needed over the port.  WHAT IS FLUSHING? Flushing helps keep the port from getting clogged. Follow your health care provider's instructions on how and when to flush the port. Ports are usually flushed with saline solution or a medicine called heparin. The need for flushing will depend on how the port is used.   If the port is used for intermittent medicines or blood draws, the port will need to be flushed:   After medicines have been given.   After blood has been drawn.   As part  of routine maintenance.   If a constant infusion is running, the port may not need to be flushed.  HOW LONG WILL MY PORT STAY IMPLANTED? The port can stay in for as long as your health care provider thinks it is needed. When it is time for the port to come out, surgery will be done to remove it. The procedure is similar to the one performed when the port was put in.  WHEN SHOULD I SEEK IMMEDIATE MEDICAL CARE? When you have an implanted port, you should seek immediate medical care if:   You notice a bad smell coming from the incision site.   You have swelling, redness, or drainage at the incision site.   You have more swelling or pain at the  port site or the surrounding area.   You have a fever that is not controlled with medicine. Document Released: 01/01/2005 Document Revised: 10/22/2012 Document Reviewed: 09/08/2012 [facility] Patient Information 2015 [place], [place]. This information is not intended to replace advice given to you by your health care provider. Make sure you discuss any questions you have with your health care provider.

## 2014-08-26 NOTE — Patient Instructions (Signed)
  Your procedure is scheduled on: 08-30-14 Report to [place] To find out your arrival time please call [phone] between 1PM - 3PM on 08-27-14 (FRIDAY)  Remember: Instructions that are not followed completely may result in serious medical risk, up to and including death, or upon the discretion of your surgeon and anesthesiologist your surgery may need to be rescheduled.    _X___ 1. Do not eat food or drink liquids after midnight. No gum chewing or hard candies.     _X___ 2. No Alcohol for 24 hours before or after surgery.   ____ 3. Bring all medications with you on the day of surgery if instructed.    _X___ 4. Notify your doctor if there is any change in your medical condition     (cold, fever, infections).     Do not wear jewelry, make-up, hairpins, clips or nail polish.  Do not wear lotions, powders, or perfumes. You may wear deodorant.  Do not shave 48 hours prior to surgery. Men may shave face and neck.  Do not bring valuables to the hospital.    [facility] is not responsible for any belongings or valuables.               Contacts, dentures or bridgework may not be worn into surgery.  Leave your suitcase in the car. After surgery it may be brought to your room.  For patients admitted to the hospital, discharge time is determined by your treatment team.   Patients discharged the day of surgery will not be allowed to drive home.   Please read over the following fact sheets that you were given:     _X___ Take these medicines the morning of surgery with A SIP OF WATER:    1. LEVOTHYROXINE (SYNTHROID)  2. LEXAPRO  3. PRILOSEC  4. TAKE AN EXTRA PRILOSEC Sunday NIGHT  5.  6.  ____ Fleet Enema (as directed)   __X__ Use CHG Soap as directed  ____ Use inhalers on the day of surgery  ____ Stop metformin 2 days prior to surgery    ____ Take 1/2 of usual insulin dose the night before surgery and none on the morning of surgery.   ____ Stop  Coumadin/Plavix/aspirin  ____ Stop Anti-inflammatories-NO NSAIDS OR ASPIRIN PRODUCTS-TYLENOL OK   ____ Stop supplements until after surgery.    ____ Bring C-Pap to the hospital.

## 2014-08-26 NOTE — H&P (Signed)
Patient ID: Robin Howard, female DOB: 12-May-1960, 54 y.o. MRN: [id number]  Chief Complaint   Patient presents with   .  Follow-up    HPI  Robin Howard is a 54 y.o. female here today for her post op left breast wide excision done on 08/13/14 and is scheuled for a mammosite placement on 08/30/14. Patient states she is doing well. The incision under her left arm is itching and sore. She was asking about port placement as well since she will be starting chemotherapy 09-14-14.  HPI  Past Medical History   Diagnosis  Date   .  Major depressive disorder, recurrent    .  HTN (hypertension)    .  Overactive bladder    .  Migraines    .  Gastritis    .  Hypothyroidism    .  OSA (obstructive sleep apnea)      untreated due to lack of insurance   .  Insomnia    .  GERD (gastroesophageal reflux disease)    .  Pericarditis  1997   .  Detached retina  1997   .  Chronic headache    .  Morbid obesity    .  NSTEMI (non-ST elevated myocardial infarction)  04/16/14     Due to demand- normal Cath 04/19/14   .  Breast cancer    .  Bronchitis  04-2014   .  Shortness of breath dyspnea      with exertion only   .  Anemia      h/o   .  Pericarditis  1997   .  Family history of adverse reaction to anesthesia      pts dad has pseudocholinestrace deficieny    Past Surgical History   Procedure  Laterality  Date   .  Back surgery       L4-5   .  Knee surgery     .  Cesarean section     .  Coronary angioplasty   April 2016   .  Retinal detachment surgery  Right  1997   .  Foot surgery     .  Breast lumpectomy with sentinel lymph node biopsy  Left  08/13/2014     Procedure: BREAST LUMPECTOMY WITH SENTINEL LYMPH NODE BIOPSY; Surgeon: Robert Bellow, MD; Location: [facility] ORS; Service: General; Laterality: Left;    Family History   Problem  Relation  Age of Onset   .  Alcohol abuse  Mother    .  Thyroid disease  Mother    .  Hypertension  Mother    .  COPD  Mother    .  Cancer  Father      liposarcoma   .   Congestive Heart Failure  Father    .  Depression  Father    .  Hypertension  Father    .  Cancer  Maternal Uncle      Pancreatic cancer   .  Heart disease  Paternal Grandfather  45     MI   .  Diabetes  Other    .  Cancer  Sister      breast   .  Breast cancer  Sister  16    Social History  Social History   Substance Use Topics   .  Smoking status:  Never Smoker   .  Smokeless tobacco:  Never Used   .  Alcohol Use:  No    Allergies  Allergen  Reactions   .  Reglan [Metoclopramide]  Shortness Of Breath and Other (See Comments)     Dystonic Reaction   .  Paxil [Paroxetine Hcl]  Other (See Comments)     Blurred vision   .  Ultram [Tramadol]  Itching and Rash    Current Outpatient Prescriptions   Medication  Sig  Dispense  Refill   .  aspirin EC 81 MG tablet  Take 81 mg by mouth daily.     .  cholecalciferol (VITAMIN D) 1000 UNITS tablet  Take 1,000 Units by mouth daily.     Marland Kitchen  escitalopram (LEXAPRO) 20 MG tablet  Take 1 tablet (20 mg total) by mouth daily. Stop the escitalopram (Patient taking differently: Take 20 mg by mouth every morning. Stop the escitalopram)  30 tablet  2   .  levothyroxine (SYNTHROID, LEVOTHROID) 100 MCG tablet  Take 1 tablet (100 mcg total) by mouth daily.  30 tablet  11   .  Multiple Vitamin (MULTIVITAMIN) capsule  Take 1 capsule by mouth daily.     Marland Kitchen  omeprazole (PRILOSEC) 40 MG capsule  Take 1 capsule (40 mg total) by mouth daily. (Patient taking differently: Take 40 mg by mouth every morning. )  30 capsule  3   .  vitamin B-12 (CYANOCOBALAMIN) 1000 MCG tablet  Take 1,000 mcg by mouth daily.     .  cefadroxil (DURICEF) 500 MG capsule  Take 1 capsule (500 mg total) by mouth 2 (two) times daily. Start the evening after the procedure 08-30-14  20 capsule  0    No current facility-administered medications for this visit.    Review of Systems  Review of Systems  Constitutional: Negative.  Respiratory: Negative.  Cardiovascular: Negative.   Blood  pressure 142/82, pulse 82, resp. rate 16, height 5' 6" (1.676 m), weight 239 lb (108.41 kg), last menstrual period 07/27/2012.  Physical Exam  Physical Exam  Constitutional: She is oriented to person, place, and time. She appears well-developed and well-nourished.  HENT:  Mouth/Throat: Oropharynx is clear and moist.  Eyes: Conjunctivae are normal. No scleral icterus.  Neck: Neck supple.  Cardiovascular: Normal rate, regular rhythm and normal heart sounds.  Pulmonary/Chest: Effort normal and breath sounds normal.  Incisions well healed.  Lymphadenopathy:  She has no cervical adenopathy.  Neurological: She is alert and oriented to person, place, and time.  Skin: Skin is warm and dry.  Psychiatric: Her behavior is normal.   Data Reviewed  Medical oncology and radiation oncology notes reviewed.  Ultrasound examination of the left breast in the 5:00 position at the site of previous wide excision shows a small residual seroma cavity measuring 1.9 x 2.8 cm in maximum dimension. This is a minimum of 1.6 cm from the overlying skin. This should represent an adequate for MammoSite balloon placement.  Examination of the axilla the site of fullness post axillary node biopsy showed no discernible seroma cavity.  Assessment   Modest residual axillary discomfort post node sampling.  Candidate for partial breast radiation and adjuvant chemotherapy based on HER-2/neu status.   Plan   Plans will be for placement of MammoSite balloon in right subclavian PowerPort on Monday, 08/30/2014.   Discussed risk and benefits of mammosite placement.  The risks associated with central venous access including arterial, pulmonary and venous injury were reviewed. The possible need for additional treatment if pulmonary injury occurs (chest tube placement) was discussed.  PCP: Roosevelt Locks  08/26/2014, 11:30  AM

## 2014-08-26 NOTE — Progress Notes (Signed)
Patient ID: Robin Howard, female DOB: 11/26/1960, 53 y.o. MRN: [id number]  Chief Complaint   Patient presents with   .  Follow-up    HPI  Robin Howard is a 53 y.o. female here today for her post op left breast wide excision done on 08/13/14 and is scheuled for a mammosite placement on 08/30/14. Patient states she is doing well. The incision under her left arm is itching and sore. She was asking about port placement as well since she will be starting chemotherapy 09-14-14.  HPI  Past Medical History   Diagnosis  Date   .  Major depressive disorder, recurrent    .  HTN (hypertension)    .  Overactive bladder    .  Migraines    .  Gastritis    .  Hypothyroidism    .  OSA (obstructive sleep apnea)      untreated due to lack of insurance   .  Insomnia    .  GERD (gastroesophageal reflux disease)    .  Pericarditis  1997   .  Detached retina  1997   .  Chronic headache    .  Morbid obesity    .  NSTEMI (non-ST elevated myocardial infarction)  04/16/14     Due to demand- normal Cath 04/19/14   .  Breast cancer    .  Bronchitis  04-2014   .  Shortness of breath dyspnea      with exertion only   .  Anemia      h/o   .  Pericarditis  1997   .  Family history of adverse reaction to anesthesia      pts dad has pseudocholinestrace deficieny    Past Surgical History   Procedure  Laterality  Date   .  Back surgery       L4-5   .  Knee surgery     .  Cesarean section     .  Coronary angioplasty   April 2016   .  Retinal detachment surgery  Right  1997   .  Foot surgery     .  Breast lumpectomy with sentinel lymph node biopsy  Left  08/13/2014     Procedure: BREAST LUMPECTOMY WITH SENTINEL LYMPH NODE BIOPSY; Surgeon: Glendel Jaggers W Lutisha Knoche, MD; Location: [facility] ORS; Service: General; Laterality: Left;    Family History   Problem  Relation  Age of Onset   .  Alcohol abuse  Mother    .  Thyroid disease  Mother    .  Hypertension  Mother    .  COPD  Mother    .  Cancer  Father      liposarcoma   .   Congestive Heart Failure  Father    .  Depression  Father    .  Hypertension  Father    .  Cancer  Maternal Uncle      Pancreatic cancer   .  Heart disease  Paternal Grandfather  50     MI   .  Diabetes  Other    .  Cancer  Sister      breast   .  Breast cancer  Sister  54    Social History  Social History   Substance Use Topics   .  Smoking status:  Never Smoker   .  Smokeless tobacco:  Never Used   .  Alcohol Use:  No    Allergies     Allergen  Reactions   .  Reglan [Metoclopramide]  Shortness Of Breath and Other (See Comments)     Dystonic Reaction   .  Paxil [Paroxetine Hcl]  Other (See Comments)     Blurred vision   .  Ultram [Tramadol]  Itching and Rash    Current Outpatient Prescriptions   Medication  Sig  Dispense  Refill   .  aspirin EC 81 MG tablet  Take 81 mg by mouth daily.     .  cholecalciferol (VITAMIN D) 1000 UNITS tablet  Take 1,000 Units by mouth daily.     .  escitalopram (LEXAPRO) 20 MG tablet  Take 1 tablet (20 mg total) by mouth daily. Stop the escitalopram (Patient taking differently: Take 20 mg by mouth every morning. Stop the escitalopram)  30 tablet  2   .  levothyroxine (SYNTHROID, LEVOTHROID) 100 MCG tablet  Take 1 tablet (100 mcg total) by mouth daily.  30 tablet  11   .  Multiple Vitamin (MULTIVITAMIN) capsule  Take 1 capsule by mouth daily.     .  omeprazole (PRILOSEC) 40 MG capsule  Take 1 capsule (40 mg total) by mouth daily. (Patient taking differently: Take 40 mg by mouth every morning. )  30 capsule  3   .  vitamin B-12 (CYANOCOBALAMIN) 1000 MCG tablet  Take 1,000 mcg by mouth daily.     .  cefadroxil (DURICEF) 500 MG capsule  Take 1 capsule (500 mg total) by mouth 2 (two) times daily. Start the evening after the procedure 08-30-14  20 capsule  0    No current facility-administered medications for this visit.    Review of Systems  Review of Systems  Constitutional: Negative.  Respiratory: Negative.  Cardiovascular: Negative.   Blood  pressure 142/82, pulse 82, resp. rate 16, height 5' 6" (1.676 m), weight 239 lb (108.41 kg), last menstrual period 07/27/2012.  Physical Exam  Physical Exam  Constitutional: She is oriented to person, place, and time. She appears well-developed and well-nourished.  HENT:  Mouth/Throat: Oropharynx is clear and moist.  Eyes: Conjunctivae are normal. No scleral icterus.  Neck: Neck supple.  Cardiovascular: Normal rate, regular rhythm and normal heart sounds.  Pulmonary/Chest: Effort normal and breath sounds normal.  Incisions well healed.  Lymphadenopathy:  She has no cervical adenopathy.  Neurological: She is alert and oriented to person, place, and time.  Skin: Skin is warm and dry.  Psychiatric: Her behavior is normal.   Data Reviewed  Medical oncology and radiation oncology notes reviewed.  Ultrasound examination of the left breast in the 5:00 position at the site of previous wide excision shows a small residual seroma cavity measuring 1.9 x 2.8 cm in maximum dimension. This is a minimum of 1.6 cm from the overlying skin. This should represent an adequate for MammoSite balloon placement.  Examination of the axilla the site of fullness post axillary node biopsy showed no discernible seroma cavity.  Assessment   Modest residual axillary discomfort post node sampling.  Candidate for partial breast radiation and adjuvant chemotherapy based on HER-2/neu status.   Plan   Plans will be for placement of MammoSite balloon in right subclavian PowerPort on Monday, 08/30/2014.   Discussed risk and benefits of mammosite placement.  The risks associated with central venous access including arterial, pulmonary and venous injury were reviewed. The possible need for additional treatment if pulmonary injury occurs (chest tube placement) was discussed.  PCP: Lada, Melinda  Mayra Jolliffe W  08/26/2014, 11:30

## 2014-08-26 NOTE — Telephone Encounter (Signed)
Patient to have port placement at time of mammosite placement.   Leah in the O.R. notified of this.

## 2014-08-26 NOTE — Telephone Encounter (Signed)
-----   Message from Robert Bellow, MD sent at 08/26/2014 11:34 AM EDT ----- Patient will need monitored anesthesia care for port placement. Thank you

## 2014-08-26 NOTE — Telephone Encounter (Signed)
Message left for Robin Howard in the O. R. regarding this. Surgery sheet was also re-faxed to the O.R. with updated information.   Also, left message for Robin Howard in Pre-admit about this as well.

## 2014-08-27 DIAGNOSIS — G4700 Insomnia, unspecified: Secondary | ICD-10-CM | POA: Diagnosis not present

## 2014-08-27 DIAGNOSIS — C50912 Malignant neoplasm of unspecified site of left female breast: Secondary | ICD-10-CM | POA: Diagnosis present

## 2014-08-27 DIAGNOSIS — Z800 Family history of malignant neoplasm of digestive organs: Secondary | ICD-10-CM | POA: Diagnosis not present

## 2014-08-27 DIAGNOSIS — Z7982 Long term (current) use of aspirin: Secondary | ICD-10-CM | POA: Diagnosis not present

## 2014-08-27 DIAGNOSIS — G4733 Obstructive sleep apnea (adult) (pediatric): Secondary | ICD-10-CM | POA: Diagnosis not present

## 2014-08-27 DIAGNOSIS — Z9861 Coronary angioplasty status: Secondary | ICD-10-CM | POA: Diagnosis not present

## 2014-08-27 DIAGNOSIS — Z79899 Other long term (current) drug therapy: Secondary | ICD-10-CM | POA: Diagnosis not present

## 2014-08-27 DIAGNOSIS — Z825 Family history of asthma and other chronic lower respiratory diseases: Secondary | ICD-10-CM | POA: Diagnosis not present

## 2014-08-27 DIAGNOSIS — Z8249 Family history of ischemic heart disease and other diseases of the circulatory system: Secondary | ICD-10-CM | POA: Diagnosis not present

## 2014-08-27 DIAGNOSIS — Z808 Family history of malignant neoplasm of other organs or systems: Secondary | ICD-10-CM | POA: Diagnosis not present

## 2014-08-27 DIAGNOSIS — Z888 Allergy status to other drugs, medicaments and biological substances status: Secondary | ICD-10-CM | POA: Diagnosis not present

## 2014-08-27 DIAGNOSIS — F339 Major depressive disorder, recurrent, unspecified: Secondary | ICD-10-CM | POA: Diagnosis not present

## 2014-08-27 DIAGNOSIS — Z803 Family history of malignant neoplasm of breast: Secondary | ICD-10-CM | POA: Diagnosis not present

## 2014-08-27 DIAGNOSIS — G43909 Migraine, unspecified, not intractable, without status migrainosus: Secondary | ICD-10-CM | POA: Diagnosis not present

## 2014-08-27 DIAGNOSIS — K219 Gastro-esophageal reflux disease without esophagitis: Secondary | ICD-10-CM | POA: Diagnosis not present

## 2014-08-27 DIAGNOSIS — I252 Old myocardial infarction: Secondary | ICD-10-CM | POA: Diagnosis not present

## 2014-08-27 DIAGNOSIS — N3281 Overactive bladder: Secondary | ICD-10-CM | POA: Diagnosis not present

## 2014-08-27 DIAGNOSIS — Z833 Family history of diabetes mellitus: Secondary | ICD-10-CM | POA: Diagnosis not present

## 2014-08-27 DIAGNOSIS — E039 Hypothyroidism, unspecified: Secondary | ICD-10-CM | POA: Diagnosis not present

## 2014-08-27 NOTE — Patient Instructions (Signed)
Trastuzumab injection for infusion What is this medicine? TRASTUZUMAB (tras TOO zoo mab) is a monoclonal antibody. It targets a protein called HER2. This protein is found in some stomach and breast cancers. This medicine can stop cancer cell growth. This medicine may be used with other cancer treatments. This medicine may be used for other purposes; ask your health care provider or pharmacist if you have questions. COMMON BRAND NAME(S): Herceptin What should I tell my health care provider before I take this medicine? They need to know if you have any of these conditions: -heart disease -heart failure -infection (especially a virus infection such as chickenpox, cold sores, or herpes) -lung or breathing disease, like asthma -recent or ongoing radiation therapy -an unusual or allergic reaction to trastuzumab, benzyl alcohol, or other medications, foods, dyes, or preservatives -pregnant or trying to get pregnant -breast-feeding How should I use this medicine? This drug is given as an infusion into a vein. It is administered in a hospital or clinic by a specially trained health care professional. Talk to your pediatrician regarding the use of this medicine in children. This medicine is not approved for use in children. Overdosage: If you think you have taken too much of this medicine contact a poison control center or emergency room at once. NOTE: This medicine is only for you. Do not share this medicine with others. What if I miss a dose? It is important not to miss a dose. Call your doctor or health care professional if you are unable to keep an appointment. What may interact with this medicine? -cyclophosphamide -doxorubicin -warfarin This list may not describe all possible interactions. Give your health care provider a list of all the medicines, herbs, non-prescription drugs, or dietary supplements you use. Also tell them if you smoke, drink alcohol, or use illegal drugs. Some items may  interact with your medicine. What should I watch for while using this medicine? Visit your doctor for checks on your progress. Report any side effects. Continue your course of treatment even though you feel ill unless your doctor tells you to stop. Call your doctor or health care professional for advice if you get a fever, chills or sore throat, or other symptoms of a cold or flu. Do not treat yourself. Try to avoid being around people who are sick. You may experience fever, chills and shaking during your first infusion. These effects are usually mild and can be treated with other medicines. Report any side effects during the infusion to your health care professional. Fever and chills usually do not happen with later infusions. What side effects may I notice from receiving this medicine? Side effects that you should report to your doctor or other health care professional as soon as possible: -breathing difficulties -chest pain or palpitations -cough -dizziness or fainting -fever or chills, sore throat -skin rash, itching or hives -swelling of the legs or ankles -unusually weak or tired Side effects that usually do not require medical attention (report to your doctor or other health care professional if they continue or are bothersome): -loss of appetite -headache -muscle aches -nausea This list may not describe all possible side effects. Call your doctor for medical advice about side effects. You may report side effects to FDA at 1-800-FDA-1088. Where should I keep my medicine? This drug is given in a hospital or clinic and will not be stored at home. NOTE: This sheet is a summary. It may not cover all possible information. If you have questions about this medicine, talk   to your doctor, pharmacist, or health care provider.  2015, Elsevier/Gold Standard. (2008-11-05 13:43:15) Docetaxel injection What is this medicine? DOCETAXEL (doe se TAX el) is a chemotherapy drug. It targets fast  dividing cells, like cancer cells, and causes these cells to die. This medicine is used to treat many types of cancers like breast cancer, certain stomach cancers, head and neck cancer, lung cancer, and prostate cancer. This medicine may be used for other purposes; ask your health care provider or pharmacist if you have questions. COMMON BRAND NAME(S): Docefrez, Taxotere What should I tell my health care provider before I take this medicine? They need to know if you have any of these conditions: -infection (especially a virus infection such as chickenpox, cold sores, or herpes) -liver disease -low blood counts, like low white cell, platelet, or red cell counts -an unusual or allergic reaction to docetaxel, polysorbate 80, other chemotherapy agents, other medicines, foods, dyes, or preservatives -pregnant or trying to get pregnant -breast-feeding How should I use this medicine? This drug is given as an infusion into a vein. It is administered in a hospital or clinic by a specially trained health care professional. Talk to your pediatrician regarding the use of this medicine in children. Special care may be needed. Overdosage: If you think you have taken too much of this medicine contact a poison control center or emergency room at once. NOTE: This medicine is only for you. Do not share this medicine with others. What if I miss a dose? It is important not to miss your dose. Call your doctor or health care professional if you are unable to keep an appointment. What may interact with this medicine? -cyclosporine -erythromycin -ketoconazole -medicines to increase blood counts like filgrastim, pegfilgrastim, sargramostim -vaccines Talk to your doctor or health care professional before taking any of these medicines: -acetaminophen -aspirin -ibuprofen -ketoprofen -naproxen This list may not describe all possible interactions. Give your health care provider a list of all the medicines, herbs,  non-prescription drugs, or dietary supplements you use. Also tell them if you smoke, drink alcohol, or use illegal drugs. Some items may interact with your medicine. What should I watch for while using this medicine? Your condition will be monitored carefully while you are receiving this medicine. You will need important blood work done while you are taking this medicine. This drug may make you feel generally unwell. This is not uncommon, as chemotherapy can affect healthy cells as well as cancer cells. Report any side effects. Continue your course of treatment even though you feel ill unless your doctor tells you to stop. In some cases, you may be given additional medicines to help with side effects. Follow all directions for their use. Call your doctor or health care professional for advice if you get a fever, chills or sore throat, or other symptoms of a cold or flu. Do not treat yourself. This drug decreases your body's ability to fight infections. Try to avoid being around people who are sick. This medicine may increase your risk to bruise or bleed. Call your doctor or health care professional if you notice any unusual bleeding. Be careful brushing and flossing your teeth or using a toothpick because you may get an infection or bleed more easily. If you have any dental work done, tell your dentist you are receiving this medicine. Avoid taking products that contain aspirin, acetaminophen, ibuprofen, naproxen, or ketoprofen unless instructed by your doctor. These medicines may hide a fever. This medicine contains an alcohol in the  product. You may get drowsy or dizzy. Do not drive, use machinery, or do anything that needs mental alertness until you know how this medicine affects you. Do not stand or sit up quickly, especially if you are an older patient. This reduces the risk of dizzy or fainting spells. Avoid alcoholic drinks Do not become pregnant while taking this medicine. Women should inform their  doctor if they wish to become pregnant or think they might be pregnant. There is a potential for serious side effects to an unborn child. Talk to your health care professional or pharmacist for more information. Do not breast-feed an infant while taking this medicine. What side effects may I notice from receiving this medicine? Side effects that you should report to your doctor or health care professional as soon as possible: -allergic reactions like skin rash, itching or hives, swelling of the face, lips, or tongue -low blood counts - This drug may decrease the number of white blood cells, red blood cells and platelets. You may be at increased risk for infections and bleeding. -signs of infection - fever or chills, cough, sore throat, pain or difficulty passing urine -signs of decreased platelets or bleeding - bruising, pinpoint red spots on the skin, black, tarry stools, nosebleeds -signs of decreased red blood cells - unusually weak or tired, fainting spells, lightheadedness -breathing problems -fast or irregular heartbeat -low blood pressure -mouth sores -nausea and vomiting -pain, swelling, redness or irritation at the injection site -pain, tingling, numbness in the hands or feet -swelling of the ankle, feet, hands -weight gain Side effects that usually do not require medical attention (report to your prescriber or health care professional if they continue or are bothersome): -bone pain -complete hair loss including hair on your head, underarms, pubic hair, eyebrows, and eyelashes -diarrhea -excessive tearing -changes in the color of fingernails -loosening of the fingernails -nausea -muscle pain -red flush to skin -sweating -weak or tired This list may not describe all possible side effects. Call your doctor for medical advice about side effects. You may report side effects to FDA at 1-800-FDA-1088. Where should I keep my medicine? This drug is given in a hospital or clinic and  will not be stored at home. NOTE: This sheet is a summary. It may not cover all possible information. If you have questions about this medicine, talk to your doctor, pharmacist, or health care provider.  2015, Elsevier/Gold Standard. (2012-11-27 22:21:02) Carboplatin injection What is this medicine? CARBOPLATIN (KAR boe pla tin) is a chemotherapy drug. It targets fast dividing cells, like cancer cells, and causes these cells to die. This medicine is used to treat ovarian cancer and many other cancers. This medicine may be used for other purposes; ask your health care provider or pharmacist if you have questions. COMMON BRAND NAME(S): Paraplatin What should I tell my health care provider before I take this medicine? They need to know if you have any of these conditions: -blood disorders -hearing problems -kidney disease -recent or ongoing radiation therapy -an unusual or allergic reaction to carboplatin, cisplatin, other chemotherapy, other medicines, foods, dyes, or preservatives -pregnant or trying to get pregnant -breast-feeding How should I use this medicine? This drug is usually given as an infusion into a vein. It is administered in a hospital or clinic by a specially trained health care professional. Talk to your pediatrician regarding the use of this medicine in children. Special care may be needed. Overdosage: If you think you have taken too much of this medicine contact  a poison control center or emergency room at once. NOTE: This medicine is only for you. Do not share this medicine with others. What if I miss a dose? It is important not to miss a dose. Call your doctor or health care professional if you are unable to keep an appointment. What may interact with this medicine? -medicines for seizures -medicines to increase blood counts like filgrastim, pegfilgrastim, sargramostim -some antibiotics like amikacin, gentamicin, neomycin, streptomycin, tobramycin -vaccines Talk to  your doctor or health care professional before taking any of these medicines: -acetaminophen -aspirin -ibuprofen -ketoprofen -naproxen This list may not describe all possible interactions. Give your health care provider a list of all the medicines, herbs, non-prescription drugs, or dietary supplements you use. Also tell them if you smoke, drink alcohol, or use illegal drugs. Some items may interact with your medicine. What should I watch for while using this medicine? Your condition will be monitored carefully while you are receiving this medicine. You will need important blood work done while you are taking this medicine. This drug may make you feel generally unwell. This is not uncommon, as chemotherapy can affect healthy cells as well as cancer cells. Report any side effects. Continue your course of treatment even though you feel ill unless your doctor tells you to stop. In some cases, you may be given additional medicines to help with side effects. Follow all directions for their use. Call your doctor or health care professional for advice if you get a fever, chills or sore throat, or other symptoms of a cold or flu. Do not treat yourself. This drug decreases your body's ability to fight infections. Try to avoid being around people who are sick. This medicine may increase your risk to bruise or bleed. Call your doctor or health care professional if you notice any unusual bleeding. Be careful brushing and flossing your teeth or using a toothpick because you may get an infection or bleed more easily. If you have any dental work done, tell your dentist you are receiving this medicine. Avoid taking products that contain aspirin, acetaminophen, ibuprofen, naproxen, or ketoprofen unless instructed by your doctor. These medicines may hide a fever. Do not become pregnant while taking this medicine. Women should inform their doctor if they wish to become pregnant or think they might be pregnant. There is a  potential for serious side effects to an unborn child. Talk to your health care professional or pharmacist for more information. Do not breast-feed an infant while taking this medicine. What side effects may I notice from receiving this medicine? Side effects that you should report to your doctor or health care professional as soon as possible: -allergic reactions like skin rash, itching or hives, swelling of the face, lips, or tongue -signs of infection - fever or chills, cough, sore throat, pain or difficulty passing urine -signs of decreased platelets or bleeding - bruising, pinpoint red spots on the skin, black, tarry stools, nosebleeds -signs of decreased red blood cells - unusually weak or tired, fainting spells, lightheadedness -breathing problems -changes in hearing -changes in vision -chest pain -high blood pressure -low blood counts - This drug may decrease the number of white blood cells, red blood cells and platelets. You may be at increased risk for infections and bleeding. -nausea and vomiting -pain, swelling, redness or irritation at the injection site -pain, tingling, numbness in the hands or feet -problems with balance, talking, walking -trouble passing urine or change in the amount of urine Side effects that usually  do not require medical attention (report to your doctor or health care professional if they continue or are bothersome): -hair loss -loss of appetite -metallic taste in the mouth or changes in taste This list may not describe all possible side effects. Call your doctor for medical advice about side effects. You may report side effects to FDA at 1-800-FDA-1088. Where should I keep my medicine? This drug is given in a hospital or clinic and will not be stored at home. NOTE: This sheet is a summary. It may not cover all possible information. If you have questions about this medicine, talk to your doctor, pharmacist, or health care provider.  2015, Elsevier/Gold  Standard. (2007-04-08 14:38:05)

## 2014-08-30 ENCOUNTER — Encounter: Payer: Medicaid Other | Admitting: General Surgery

## 2014-08-30 ENCOUNTER — Encounter: Payer: Self-pay | Admitting: *Deleted

## 2014-08-30 ENCOUNTER — Encounter
Admission: RE | Disposition: A | Discharge status: Home / Self Care | Payer: Self-pay | Source: Ambulatory Visit | Attending: General Surgery

## 2014-08-30 ENCOUNTER — Ambulatory Visit: Payer: Medicaid Other | Admitting: *Deleted

## 2014-08-30 ENCOUNTER — Ambulatory Visit
Admission: RE | Admit: 2014-08-30 | Discharge: 2014-08-30 | Disposition: A | Discharge status: Home / Self Care | Payer: Medicaid Other | Source: Ambulatory Visit | Attending: General Surgery | Admitting: General Surgery

## 2014-08-30 ENCOUNTER — Ambulatory Visit: Payer: Medicaid Other

## 2014-08-30 DIAGNOSIS — Z808 Family history of malignant neoplasm of other organs or systems: Secondary | ICD-10-CM | POA: Insufficient documentation

## 2014-08-30 DIAGNOSIS — Z95828 Presence of other vascular implants and grafts: Secondary | ICD-10-CM

## 2014-08-30 DIAGNOSIS — G4700 Insomnia, unspecified: Secondary | ICD-10-CM | POA: Insufficient documentation

## 2014-08-30 DIAGNOSIS — Z8249 Family history of ischemic heart disease and other diseases of the circulatory system: Secondary | ICD-10-CM | POA: Insufficient documentation

## 2014-08-30 DIAGNOSIS — F339 Major depressive disorder, recurrent, unspecified: Secondary | ICD-10-CM | POA: Insufficient documentation

## 2014-08-30 DIAGNOSIS — C50912 Malignant neoplasm of unspecified site of left female breast: Secondary | ICD-10-CM | POA: Insufficient documentation

## 2014-08-30 DIAGNOSIS — K219 Gastro-esophageal reflux disease without esophagitis: Secondary | ICD-10-CM | POA: Insufficient documentation

## 2014-08-30 DIAGNOSIS — Z800 Family history of malignant neoplasm of digestive organs: Secondary | ICD-10-CM | POA: Insufficient documentation

## 2014-08-30 DIAGNOSIS — E039 Hypothyroidism, unspecified: Secondary | ICD-10-CM | POA: Insufficient documentation

## 2014-08-30 DIAGNOSIS — Z825 Family history of asthma and other chronic lower respiratory diseases: Secondary | ICD-10-CM | POA: Insufficient documentation

## 2014-08-30 DIAGNOSIS — N3281 Overactive bladder: Secondary | ICD-10-CM | POA: Insufficient documentation

## 2014-08-30 DIAGNOSIS — Z7982 Long term (current) use of aspirin: Secondary | ICD-10-CM | POA: Insufficient documentation

## 2014-08-30 DIAGNOSIS — Z79899 Other long term (current) drug therapy: Secondary | ICD-10-CM | POA: Insufficient documentation

## 2014-08-30 DIAGNOSIS — Z833 Family history of diabetes mellitus: Secondary | ICD-10-CM | POA: Insufficient documentation

## 2014-08-30 DIAGNOSIS — G4733 Obstructive sleep apnea (adult) (pediatric): Secondary | ICD-10-CM | POA: Insufficient documentation

## 2014-08-30 DIAGNOSIS — Z888 Allergy status to other drugs, medicaments and biological substances status: Secondary | ICD-10-CM | POA: Insufficient documentation

## 2014-08-30 DIAGNOSIS — Z9861 Coronary angioplasty status: Secondary | ICD-10-CM | POA: Insufficient documentation

## 2014-08-30 DIAGNOSIS — G43909 Migraine, unspecified, not intractable, without status migrainosus: Secondary | ICD-10-CM | POA: Insufficient documentation

## 2014-08-30 DIAGNOSIS — Z803 Family history of malignant neoplasm of breast: Secondary | ICD-10-CM | POA: Insufficient documentation

## 2014-08-30 DIAGNOSIS — I252 Old myocardial infarction: Secondary | ICD-10-CM | POA: Insufficient documentation

## 2014-08-30 DIAGNOSIS — C50512 Malignant neoplasm of lower-outer quadrant of left female breast: Secondary | ICD-10-CM | POA: Diagnosis not present

## 2014-08-30 HISTORY — PX: BREAST MAMMOSITE: SHX5264

## 2014-08-30 HISTORY — PX: PORTACATH PLACEMENT: SHX2246

## 2014-08-30 SURGERY — MAMMOSITE BREAST
Anesthesia: Monitor Anesthesia Care | Laterality: Right | Wound class: Clean

## 2014-08-30 MED ORDER — LIDOCAINE HCL 1 % IJ SOLN
INTRAMUSCULAR | Status: DC | PRN
  Administered 2014-08-30: 24 mL

## 2014-08-30 MED ORDER — ONDANSETRON HCL 4 MG/2ML IJ SOLN
4.0000 mg | Freq: Once | INTRAMUSCULAR | Status: AC | PRN
  Administered 2014-08-30: 4 mg via INTRAVENOUS

## 2014-08-30 MED ORDER — ONDANSETRON HCL 4 MG/2ML IJ SOLN
INTRAMUSCULAR | Status: AC
  Filled 2014-08-30: qty 2

## 2014-08-30 MED ORDER — ACETAMINOPHEN 10 MG/ML IV SOLN
INTRAVENOUS | Status: AC
  Filled 2014-08-30: qty 100

## 2014-08-30 MED ORDER — SODIUM CHLORIDE 0.9 % IJ SOLN
INTRAMUSCULAR | Status: AC
  Filled 2014-08-30: qty 20

## 2014-08-30 MED ORDER — CEFAZOLIN SODIUM-DEXTROSE 2-3 GM-% IV SOLR
INTRAVENOUS | Status: AC
Start: 2014-08-30 — End: 2014-08-30
  Administered 2014-08-30: 2 g via INTRAVENOUS
  Filled 2014-08-30: qty 50

## 2014-08-30 MED ORDER — FENTANYL CITRATE (PF) 100 MCG/2ML IJ SOLN
INTRAMUSCULAR | Status: DC
Start: 2014-08-30 — End: 2014-08-30
  Filled 2014-08-30: qty 2

## 2014-08-30 MED ORDER — LACTATED RINGERS IV SOLN
INTRAVENOUS | Status: DC
  Administered 2014-08-30: 12:00:00 via INTRAVENOUS

## 2014-08-30 MED ORDER — SODIUM BICARBONATE 4 % IV SOLN
INTRAVENOUS | Status: AC
  Filled 2014-08-30: qty 5

## 2014-08-30 MED ORDER — HYDROCODONE-ACETAMINOPHEN 5-325 MG PO TABS: 1.0000 | ORAL_TABLET | ORAL | Status: DC | PRN

## 2014-08-30 MED ORDER — FENTANYL CITRATE (PF) 100 MCG/2ML IJ SOLN
25.0000 ug | INTRAMUSCULAR | Status: DC | PRN
  Administered 2014-08-30 (×2): 50 ug via INTRAVENOUS

## 2014-08-30 MED ORDER — SODIUM CHLORIDE 0.9 % IJ SOLN
INTRAMUSCULAR | Status: AC
  Filled 2014-08-30: qty 50

## 2014-08-30 MED ORDER — SODIUM CHLORIDE 0.9 % IJ SOLN
INTRAMUSCULAR | Status: AC
  Filled 2014-08-30: qty 100

## 2014-08-30 MED ORDER — BUPIVACAINE HCL (PF) 0.5 % IJ SOLN
INTRAMUSCULAR | Status: AC
  Filled 2014-08-30: qty 30

## 2014-08-30 MED ORDER — SODIUM CHLORIDE 0.9 % IJ SOLN
INTRAMUSCULAR | Status: DC | PRN
  Administered 2014-08-30: 20 mL

## 2014-08-30 MED ORDER — LIDOCAINE HCL (CARDIAC) 20 MG/ML IV SOLN
INTRAVENOUS | Status: DC | PRN
  Administered 2014-08-30: 60 mg via INTRAVENOUS

## 2014-08-30 MED ORDER — BACITRACIN ZINC 500 UNIT/GM EX OINT
TOPICAL_OINTMENT | CUTANEOUS | Status: AC
  Filled 2014-08-30: qty 28.35

## 2014-08-30 MED ORDER — LIDOCAINE HCL (PF) 1 % IJ SOLN
INTRAMUSCULAR | Status: AC
  Filled 2014-08-30: qty 30

## 2014-08-30 MED ORDER — CEFAZOLIN SODIUM-DEXTROSE 2-3 GM-% IV SOLR
2.0000 g | INTRAVENOUS | Status: AC
  Administered 2014-08-30: 2 g via INTRAVENOUS

## 2014-08-30 MED ORDER — FENTANYL CITRATE (PF) 100 MCG/2ML IJ SOLN
INTRAMUSCULAR | Status: DC | PRN
  Administered 2014-08-30 (×2): 50 ug via INTRAVENOUS

## 2014-08-30 MED ORDER — PROPOFOL INFUSION 10 MG/ML OPTIME
INTRAVENOUS | Status: DC | PRN
  Administered 2014-08-30: 50 ug/kg/min via INTRAVENOUS

## 2014-08-30 MED ORDER — MIDAZOLAM HCL 2 MG/2ML IJ SOLN
INTRAMUSCULAR | Status: DC | PRN
Start: 2014-08-30 — End: 2014-08-30
  Administered 2014-08-30: 2 mg via INTRAVENOUS

## 2014-08-30 MED ORDER — IOHEXOL 180 MG/ML  SOLN
INTRAMUSCULAR | Status: AC
  Filled 2014-08-30: qty 20

## 2014-08-30 SURGICAL SUPPLY — 55 items
APL SKNCLS STERI-STRIP NONHPOA (GAUZE/BANDAGES/DRESSINGS) ×2
BASIN GRAD PLASTIC 32OZ STRL (MISCELLANEOUS) ×4 IMPLANT
BENZOIN TINCTURE PRP APPL 2/3 (GAUZE/BANDAGES/DRESSINGS) ×2 IMPLANT
BLADE SURG 15 STRL SS SAFETY (BLADE) ×4 IMPLANT
CANISTER SUCT 1200ML W/VALVE (MISCELLANEOUS) ×4 IMPLANT
CHLORAPREP W/TINT 26ML (MISCELLANEOUS) ×4 IMPLANT
CLOSURE WOUND 1/2 X4 (GAUZE/BANDAGES/DRESSINGS) ×1
CNTNR SPEC 2.5X3XGRAD LEK (MISCELLANEOUS)
CONT SPEC 4OZ STER OR WHT (MISCELLANEOUS)
CONT SPEC 4OZ STRL OR WHT (MISCELLANEOUS)
CONTAINER SPEC 2.5X3XGRAD LEK (MISCELLANEOUS) ×2 IMPLANT
COVER LIGHT HANDLE STERIS (MISCELLANEOUS) ×8 IMPLANT
COVER PROBE FLX POLY STRL (MISCELLANEOUS) ×2 IMPLANT
DECANTER SPIKE VIAL GLASS SM (MISCELLANEOUS) ×8 IMPLANT
DEVICE DUBIN SPECIMEN MAMMOGRA (MISCELLANEOUS) ×2 IMPLANT
DRAPE C-ARM XRAY 36X54 (DRAPES) ×4 IMPLANT
DRAPE LAPAROTOMY 100X77 ABD (DRAPES) ×2 IMPLANT
DRAPE LAPAROTOMY TRNSV 106X77 (MISCELLANEOUS) ×4 IMPLANT
DRESSING TELFA 4X3 1S ST N-ADH (GAUZE/BANDAGES/DRESSINGS) ×4 IMPLANT
DRSG TEGADERM 2-3/8X2-3/4 SM (GAUZE/BANDAGES/DRESSINGS) ×4 IMPLANT
DRSG TEGADERM 4X4.75 (GAUZE/BANDAGES/DRESSINGS) ×4 IMPLANT
DRSG TELFA 3X8 NADH (GAUZE/BANDAGES/DRESSINGS) ×4 IMPLANT
GAUZE SPONGE 4X4 12PLY STRL (GAUZE/BANDAGES/DRESSINGS) ×4 IMPLANT
GLOVE BIO SURGEON STRL SZ7.5 (GLOVE) ×8 IMPLANT
GLOVE INDICATOR 8.0 STRL GRN (GLOVE) ×8 IMPLANT
GOWN STRL REUS W/ TWL LRG LVL3 (GOWN DISPOSABLE) ×4 IMPLANT
GOWN STRL REUS W/TWL LRG LVL3 (GOWN DISPOSABLE) ×12
HARMONIC SCALPEL FOCUS (MISCELLANEOUS) ×2 IMPLANT
KIT RM TURNOVER STRD PROC AR (KITS) ×4 IMPLANT
LABEL OR SOLS (LABEL) ×2 IMPLANT
MARGIN MAP 10MM (MISCELLANEOUS) ×2 IMPLANT
NDL HYPO 25GX1X1/2 BEV (NEEDLE) ×2 IMPLANT
NDL SAFETY 22GX1.5 (NEEDLE) ×4 IMPLANT
NDL SAFETY 25GX1.5 (NEEDLE) ×4 IMPLANT
NEEDLE HYPO 25GX1X1/2 BEV (NEEDLE) ×4 IMPLANT
NS IRRIG 500ML POUR BTL (IV SOLUTION) ×4 IMPLANT
PACK BASIN MINOR ARMC (MISCELLANEOUS) ×2 IMPLANT
PACK PORT-A-CATH (MISCELLANEOUS) ×4 IMPLANT
PAD DRESSING TELFA 3X8 NADH (GAUZE/BANDAGES/DRESSINGS) IMPLANT
PAD GROUND ADULT SPLIT (MISCELLANEOUS) ×4 IMPLANT
PORTACATH POWER 8F (Port) ×4 IMPLANT
STRIP CLOSURE SKIN 1/2X4 (GAUZE/BANDAGES/DRESSINGS) ×3 IMPLANT
SUT ETHILON 3-0 FS-10 30 BLK (SUTURE) ×4
SUT PROLENE 3 0 SH DA (SUTURE) ×4 IMPLANT
SUT VIC AB 2-0 CT1 27 (SUTURE) ×4
SUT VIC AB 2-0 CT1 TAPERPNT 27 (SUTURE) ×2 IMPLANT
SUT VIC AB 3-0 SH 27 (SUTURE) ×4
SUT VIC AB 3-0 SH 27X BRD (SUTURE) ×2 IMPLANT
SUT VIC AB 4-0 FS2 27 (SUTURE) ×4 IMPLANT
SUTURE EHLN 3-0 FS-10 30 BLK (SUTURE) ×2 IMPLANT
SWABSTK COMLB BENZOIN TINCTURE (MISCELLANEOUS) ×4 IMPLANT
SYR CONTROL 10ML (SYRINGE) ×6 IMPLANT
TAPE TRANSPORE STRL 2 31045 (GAUZE/BANDAGES/DRESSINGS) ×2 IMPLANT
TOWEL OR 17X26 4PK STRL BLUE (TOWEL DISPOSABLE) ×4 IMPLANT
TRAY MAMMOSITE APPLI 4 5 CM (KITS) ×6 IMPLANT

## 2014-08-30 NOTE — Discharge Instructions (Signed)
AMBULATORY SURGERY  °DISCHARGE INSTRUCTIONS ° ° °1) The drugs that you were given will stay in your system until tomorrow so for the next 24 hours you should not: ° °A) Drive an automobile °B) Make any legal decisions °C) Drink any alcoholic beverage ° ° °2) You may resume regular meals tomorrow.  Today it is better to start with liquids and gradually work up to solid foods. ° °You may eat anything you prefer, but it is better to start with liquids, then soup and crackers, and gradually work up to solid foods. ° ° °3) Please notify your doctor immediately if you have any unusual bleeding, trouble breathing, redness and pain at the surgery site, drainage, fever, or pain not relieved by medication. ° ° ° °4) Additional Instructions: ° ° ° ° ° ° ° °Please contact your physician with any problems or Same Day Surgery at [phone], Monday through Friday 6 am to 4 pm, or [place] [facility] [place] Main number at [phone].AMBULATORY SURGERY  °DISCHARGE INSTRUCTIONS ° ° °5) The drugs that you were given will stay in your system until tomorrow so for the next 24 hours you should not: ° °D) Drive an automobile °E) Make any legal decisions °F) Drink any alcoholic beverage ° ° °6) You may resume regular meals tomorrow.  Today it is better to start with liquids and gradually work up to solid foods. ° °You may eat anything you prefer, but it is better to start with liquids, then soup and crackers, and gradually work up to solid foods. ° ° °7) Please notify your doctor immediately if you have any unusual bleeding, trouble breathing, redness and pain at the surgery site, drainage, fever, or pain not relieved by medication. ° ° ° °8) Additional Instructions: ° ° ° ° ° ° ° °Please contact your physician with any problems or Same Day Surgery at [phone], Monday through Friday 6 am to 4 pm, or [place] at  Main number at [phone]. °

## 2014-08-30 NOTE — OR Nursing (Signed)
Mixture for Mammosite balloon:  120 ml Inj. NaCl mixed with 10 ml Omnipaque (180mg I/ml)  Balloon is filled with 35 ml of mixture

## 2014-08-30 NOTE — Op Note (Signed)
Preoperative diagnosis: Left breast cancer, candidate for adjuvant chemotherapy and accelerated partial breast radiation.  Postoperative diagnosis: Same.  Operative procedure: Right subclavian PowerPort placement with ultrasound and fluoroscopic guidance. Left side MammoSite balloon placement, 35 mL Polly him.  Anesthesia: Attended loca, 24 cc 1% Xylocaine plain.  Estimated blood loss: Minimal.  Clinical note. This 54 year old woman had a left breast cancer managed by wide excision and sentinel node biopsy. She is a candidate for partial breast radiation. Based on her tumor histology she is a candidate for adjuvant chemotherapy. Central venous access was requested by her treating oncologist.  Operative note: The patient received Kefzol intravenously prior to the procedure. Both chest and neck were prepped with chlor prep and drape. The right subclavian vein was evaluated by ultrasound and found to be patent. With the patient in Trendelenburg position and the vein was cannulated on single stick under ultrasound guidance. The guidewire was advanced followed by the dilator and catheter. This was tunneled to a pocket on the right chest. There was a kink The puncture site the required manipulation repassage of the catheter. After this was completed the catheter easily irrigated and aspirated. The tip of the catheter was in the proximal atrium where it aspirated best. The port was anchored to the deep tissue with interrupted 3-0 proline sutures. The wound was closed in layers with 3-Vicryls to the adipose layer and running 4-Vicryls subcuticular suture for the skin. Benzoin, Steri-Strips, Telfa and Tegaderm dressings were applied.  Attention was turned to the left breast. The small seroma cavity was identified. Local anesthesia was infiltrated laterally and the 8-gauge trocar advanced into the cavity under ultrasound guidance. Less than 10 mL of odorless straw-colored fluid was returned. The cavity  evaluation device was placed under ultrasound guidance and inflated with 35 mL of saline. This showed that spacing of 1.1 cm to the skin, adequate for treatment. The balloon symmetry was good.  The cavity evaluation device was removed and the treatment balloon was passed, again insufflated with 35 mL of saline/Omnipaque. The minimal distance was noted to be 1.0 cm, again adequate for treatment. The exit site was treated with bacitracin, gauze pads applied and tape.  The patient tolerated both procedures well. Erect portable chest x-ray in the recovery room showed the catheter tip at the very top of the right atrium. No evidence pneumothorax. The patient tolerated the procedure well was taken to the recovery room in stable condition.

## 2014-08-30 NOTE — Anesthesia Preprocedure Evaluation (Signed)
Anesthesia Evaluation  Patient identified by MRN, date of birth, ID band Patient awake    Reviewed: Allergy & Precautions, NPO status , Patient's Chart, lab work & pertinent test results  History of Anesthesia Complications (+) Family history of anesthesia reaction  Airway Mallampati: I  TM Distance: >3 FB Neck ROM: Full    Dental   Pulmonary shortness of breath and with exertion, sleep apnea ,  Recent sleep study, does have OSA, not in possession of a CPAP unit yet.   Pulmonary exam normal       Cardiovascular Exercise Tolerance: Poor hypertension, + Past MI Normal cardiovascular exam Non-STEMI--normal cath, dx: demand.   Neuro/Psych    GI/Hepatic GERD-  Medicated and Controlled,  Endo/Other  Hypothyroidism   Renal/GU      Musculoskeletal   Abdominal (+) + obese,  Abdomen: soft.    Peds  Hematology   Anesthesia Other Findings   Reproductive/Obstetrics                             Anesthesia Physical Anesthesia Plan  ASA: III  Anesthesia Plan: MAC   Post-op Pain Management:    Induction: Intravenous  Airway Management Planned: Simple Face Mask  Additional Equipment:   Intra-op Plan:   Post-operative Plan:   Informed Consent: I have reviewed the patients History and Physical, chart, labs and discussed the procedure including the risks, benefits and alternatives for the proposed anesthesia with the patient or authorized representative who has indicated his/her understanding and acceptance.     Plan Discussed with: CRNA  Anesthesia Plan Comments: (Her father has a pseudocholinesterase deficiency. She has been fine with her own anesthetics.)        Anesthesia Quick Evaluation

## 2014-08-30 NOTE — Anesthesia Postprocedure Evaluation (Signed)
  Anesthesia Post-op Note  Patient: Robin Howard  Procedure(s) Performed: Procedure(s): MAMMOSITE BREAST (Left) INSERTION PORT-A-CATH (Right)  Anesthesia type:MAC  Patient location: PACU  Post pain: Pain level controlled  Post assessment: Post-op Vital signs reviewed, Patient's Cardiovascular Status Stable, Respiratory Function Stable, Patent Airway and No signs of Nausea or vomiting  Post vital signs: Reviewed and stable  Last Vitals:  Filed Vitals:   08/30/14 1330  BP: 140/100  Pulse: 62  Temp:   Resp: 13    Level of consciousness: awake, alert  and patient cooperative  Complications: No apparent anesthesia complications

## 2014-08-30 NOTE — Transfer of Care (Signed)
Immediate Anesthesia Transfer of Care Note  Patient: Robin Howard  Procedure(s) Performed: Procedure(s): MAMMOSITE BREAST (Left) INSERTION PORT-A-CATH (N/A)  Patient Location: PACU  Anesthesia Type:MAC  Level of Consciousness: awake, alert  and oriented  Airway & Oxygen Therapy: Patient Spontanous Breathing and Patient connected to face mask oxygen  Post-op Assessment: Report given to RN and Post -op Vital signs reviewed and stable  Post vital signs: Reviewed and stable  Last Vitals:  Filed Vitals:   08/30/14 1330  BP: 140/100  Pulse: 62  Temp:   Resp: 13   NZVJ:28.2  Complications: No apparent anesthesia complications

## 2014-08-30 NOTE — H&P (Signed)
The patient presents for right side PowerPort and left breast MammoSite placement. No change in clinical exam.  Cardiopulmonary exam is normal.  Preoperative antibiotic ordered.

## 2014-08-31 ENCOUNTER — Other Ambulatory Visit: Payer: Self-pay | Admitting: *Deleted

## 2014-08-31 ENCOUNTER — Ambulatory Visit
Admission: RE | Admit: 2014-08-31 | Discharge: 2014-08-31 | Disposition: A | Discharge status: Home / Self Care | Payer: Medicaid Other | Source: Ambulatory Visit | Attending: Oncology | Admitting: Oncology

## 2014-08-31 ENCOUNTER — Ambulatory Visit: Payer: Self-pay

## 2014-08-31 DIAGNOSIS — C50912 Malignant neoplasm of unspecified site of left female breast: Secondary | ICD-10-CM | POA: Insufficient documentation

## 2014-08-31 MED ORDER — LIDOCAINE-PRILOCAINE 2.5-2.5 % EX CREA: 1.0000 "application " | TOPICAL_CREAM | Freq: Once | CUTANEOUS | Status: DC

## 2014-08-31 MED ORDER — TECHNETIUM TC 99M-LABELED RED BLOOD CELLS IV KIT
20.0000 | PACK | Freq: Once | INTRAVENOUS | Status: AC | PRN
  Administered 2014-08-31: 21.7 via INTRAVENOUS

## 2014-09-01 ENCOUNTER — Ambulatory Visit
Admission: RE | Admit: 2014-09-01 | Discharge: 2014-09-01 | Disposition: A | Discharge status: Home / Self Care | Payer: Medicaid Other | Source: Ambulatory Visit | Attending: Radiation Oncology | Admitting: Radiation Oncology

## 2014-09-01 DIAGNOSIS — Z510 Encounter for antineoplastic radiation therapy: Secondary | ICD-10-CM | POA: Diagnosis present

## 2014-09-01 DIAGNOSIS — C50912 Malignant neoplasm of unspecified site of left female breast: Secondary | ICD-10-CM | POA: Diagnosis not present

## 2014-09-01 DIAGNOSIS — Z171 Estrogen receptor negative status [ER-]: Secondary | ICD-10-CM | POA: Diagnosis not present

## 2014-09-01 DIAGNOSIS — I10 Essential (primary) hypertension: Secondary | ICD-10-CM | POA: Diagnosis not present

## 2014-09-01 DIAGNOSIS — G4733 Obstructive sleep apnea (adult) (pediatric): Secondary | ICD-10-CM | POA: Diagnosis not present

## 2014-09-01 DIAGNOSIS — K219 Gastro-esophageal reflux disease without esophagitis: Secondary | ICD-10-CM | POA: Diagnosis not present

## 2014-09-01 DIAGNOSIS — Z803 Family history of malignant neoplasm of breast: Secondary | ICD-10-CM | POA: Diagnosis not present

## 2014-09-01 DIAGNOSIS — E669 Obesity, unspecified: Secondary | ICD-10-CM | POA: Diagnosis not present

## 2014-09-01 DIAGNOSIS — E039 Hypothyroidism, unspecified: Secondary | ICD-10-CM | POA: Diagnosis not present

## 2014-09-01 DIAGNOSIS — Z7982 Long term (current) use of aspirin: Secondary | ICD-10-CM | POA: Diagnosis not present

## 2014-09-01 DIAGNOSIS — I252 Old myocardial infarction: Secondary | ICD-10-CM | POA: Diagnosis not present

## 2014-09-01 NOTE — Telephone Encounter (Signed)
disregard

## 2014-09-02 ENCOUNTER — Ambulatory Visit (INDEPENDENT_AMBULATORY_CARE_PROVIDER_SITE_OTHER): Payer: Medicaid Other | Admitting: General Surgery

## 2014-09-02 ENCOUNTER — Ambulatory Visit
Admission: RE | Admit: 2014-09-02 | Discharge: 2014-09-02 | Disposition: A | Discharge status: Home / Self Care | Payer: Medicaid Other | Source: Ambulatory Visit | Attending: Radiation Oncology | Admitting: Radiation Oncology

## 2014-09-02 ENCOUNTER — Encounter: Payer: Self-pay | Admitting: General Surgery

## 2014-09-02 ENCOUNTER — Other Ambulatory Visit: Payer: Self-pay

## 2014-09-02 VITALS — BP 148/88 | HR 80 | Temp 99.1°F | Resp 16 | Ht 66.0 in | Wt 241.0 lb

## 2014-09-02 DIAGNOSIS — C50912 Malignant neoplasm of unspecified site of left female breast: Secondary | ICD-10-CM

## 2014-09-02 DIAGNOSIS — Z510 Encounter for antineoplastic radiation therapy: Secondary | ICD-10-CM | POA: Diagnosis not present

## 2014-09-02 DIAGNOSIS — T50905A Adverse effect of unspecified drugs, medicaments and biological substances, initial encounter: Secondary | ICD-10-CM

## 2014-09-02 MED ORDER — METHYLPREDNISOLONE 4 MG PO TBPK: ORAL_TABLET | ORAL | Status: DC

## 2014-09-02 NOTE — Progress Notes (Signed)
Patient ID: Robin Howard, female   DOB: 1960/08/13, 54 y.o.   MRN: [id number]  Chief Complaint  Patient presents with  . Wound Check    HPI Robin Howard is a 54 y.o. female. Here today for postoperative visit, left mammosite and port placement on 08-13-14. She is having a reaction to the Tegaderm and ha blisters to the port site. She has used benadryl gel and hydrocortisone cream to the area and it is worse today.      HPI  Past Medical History  Diagnosis Date  . Major depressive disorder, recurrent   . HTN (hypertension)   . Overactive bladder   . Migraines   . Gastritis   . Hypothyroidism   . OSA (obstructive sleep apnea)     untreated due to lack of insurance  . Insomnia   . GERD (gastroesophageal reflux disease)   . Pericarditis 1997  . Detached retina 1997  . Chronic headache   . Morbid obesity   . NSTEMI (non-ST elevated myocardial infarction) 04/16/14    Due to demand- normal Cath 04/19/14  . Breast cancer   . Bronchitis 04-2014  . Shortness of breath dyspnea     with exertion only  . Anemia     h/o  . Pericarditis 1997  . Family history of adverse reaction to anesthesia     pts dad has pseudocholinestrace deficieny    Past Surgical History  Procedure Laterality Date  . Back surgery      L4-5  . Knee surgery    . Cesarean section    . Coronary angioplasty  April 2016  . Retinal detachment surgery Right 1997  . Foot surgery    . Breast lumpectomy with sentinel lymph node biopsy Left 08/13/2014    Procedure: BREAST LUMPECTOMY WITH SENTINEL LYMPH NODE BIOPSY;  Surgeon: Robert Bellow, MD;  Location: [facility];  Service: General;  Laterality: Left;  . Breast mammosite Left 08/30/2014    Procedure: MAMMOSITE BREAST;  Surgeon: Robert Bellow, MD;  Location: [facility];  Service: General;  Laterality: Left;  . Portacath placement Right 08/30/2014    Procedure: INSERTION PORT-A-CATH;  Surgeon: Robert Bellow, MD;  Location: [facility];  Service: General;  Laterality:  Right;    Family History  Problem Relation Age of Onset  . Alcohol abuse Mother   . Thyroid disease Mother   . Hypertension Mother   . COPD Mother   . Cancer Father     liposarcoma  . Congestive Heart Failure Father   . Depression Father   . Hypertension Father   . Cancer Maternal Uncle     Pancreatic cancer  . Heart disease Paternal Grandfather 41    MI  . Diabetes Other   . Cancer Sister     breast  . Breast cancer Sister 40    Social History Social History  Substance Use Topics  . Smoking status: Never Smoker   . Smokeless tobacco: Never Used  . Alcohol Use: No    Allergies  Allergen Reactions  . Reglan [Metoclopramide] Shortness Of Breath and Other (See Comments)    Dystonic Reaction  . Paxil [Paroxetine Hcl] Other (See Comments)    Blurred vision  . Ultram [Tramadol] Itching and Rash    Current Outpatient Prescriptions  Medication Sig Dispense Refill  . aspirin EC 81 MG tablet Take 81 mg by mouth daily.    . cefadroxil (DURICEF) 500 MG capsule Take 1 capsule (500 mg total) by mouth  2 (two) times daily. Start the evening after the procedure 08-30-14 20 capsule 0  . cholecalciferol (VITAMIN D) 1000 UNITS tablet Take 1,000 Units by mouth daily.    . diphenhydrAMINE (BENADRYL) 25 MG tablet Take 25 mg by mouth every 6 (six) hours as needed.    Marland Kitchen escitalopram (LEXAPRO) 20 MG tablet Take 1 tablet (20 mg total) by mouth daily. Stop the escitalopram (Patient taking differently: Take 20 mg by mouth every morning. Stop the escitalopram) 30 tablet 2  . HYDROcodone-acetaminophen (NORCO) 5-325 MG per tablet Take 1 tablet by mouth every 4 (four) hours as needed for moderate pain. 30 tablet 0  . levothyroxine (SYNTHROID, LEVOTHROID) 100 MCG tablet Take 1 tablet (100 mcg total) by mouth daily. 30 tablet 11  . lidocaine-prilocaine (EMLA) cream Apply 1 application topically once. Apply cream to port 1-2 hours prior to chemotherapy appointment, cover with plastic wrap. 30 g 0  .  Multiple Vitamin (MULTIVITAMIN) capsule Take 1 capsule by mouth daily.    Marland Kitchen omeprazole (PRILOSEC) 40 MG capsule Take 1 capsule (40 mg total) by mouth daily. (Patient taking differently: Take 40 mg by mouth every morning. ) 30 capsule 3  . vitamin B-12 (CYANOCOBALAMIN) 1000 MCG tablet Take 1,000 mcg by mouth daily.    . methylPREDNISolone (MEDROL) 4 MG TBPK tablet Take six tablets to start, decrease by one tablet each day. 21 tablet 0   No current facility-administered medications for this visit.    Review of Systems Review of Systems  Constitutional: Negative.   Respiratory: Negative.   Cardiovascular: Negative.     Blood pressure 148/88, pulse 80, temperature 99.1 F (37.3 C), resp. rate 16, height 5\' 6"  (1.676 m), weight 241 lb (109.317 kg), last menstrual period 08/26/2012.  Physical Exam Physical Exam  Constitutional: She is oriented to person, place, and time. She appears well-developed and well-nourished.  Pulmonary/Chest:    Neurological: She is alert and oriented to person, place, and time.  Skin: Skin is warm and dry.  Psychiatric: Her behavior is normal.     Assessment    Cutaneous reaction to Telfa pad.    Plan    The patient will be placed on a Medrol Dosepak, 6 tabs to start decreasing by 1 tab daily.      PCP:  Roosevelt Locks 09/04/2014, 6:53 AM

## 2014-09-02 NOTE — Patient Instructions (Signed)
The patient is aware to call back for any questions or concerns.  

## 2014-09-03 ENCOUNTER — Ambulatory Visit
Admission: RE | Admit: 2014-09-03 | Discharge: 2014-09-03 | Disposition: A | Discharge status: Home / Self Care | Payer: Medicaid Other | Source: Ambulatory Visit | Attending: Radiation Oncology | Admitting: Radiation Oncology

## 2014-09-03 ENCOUNTER — Other Ambulatory Visit: Payer: Self-pay

## 2014-09-03 DIAGNOSIS — Z510 Encounter for antineoplastic radiation therapy: Secondary | ICD-10-CM | POA: Diagnosis not present

## 2014-09-06 ENCOUNTER — Other Ambulatory Visit: Payer: Self-pay

## 2014-09-06 ENCOUNTER — Ambulatory Visit
Admission: RE | Admit: 2014-09-06 | Discharge: 2014-09-06 | Disposition: A | Discharge status: Home / Self Care | Payer: Medicaid Other | Source: Ambulatory Visit | Attending: Radiation Oncology | Admitting: Radiation Oncology

## 2014-09-06 ENCOUNTER — Ambulatory Visit: Payer: Medicaid Other

## 2014-09-06 DIAGNOSIS — Z510 Encounter for antineoplastic radiation therapy: Secondary | ICD-10-CM | POA: Diagnosis not present

## 2014-09-07 ENCOUNTER — Other Ambulatory Visit: Payer: Self-pay

## 2014-09-07 ENCOUNTER — Ambulatory Visit: Payer: Medicaid Other

## 2014-09-07 ENCOUNTER — Ambulatory Visit
Admission: RE | Admit: 2014-09-07 | Discharge: 2014-09-07 | Disposition: A | Discharge status: Home / Self Care | Payer: Medicaid Other | Source: Ambulatory Visit | Attending: Radiation Oncology | Admitting: Radiation Oncology

## 2014-09-07 DIAGNOSIS — Z510 Encounter for antineoplastic radiation therapy: Secondary | ICD-10-CM | POA: Diagnosis not present

## 2014-09-08 ENCOUNTER — Other Ambulatory Visit: Payer: Self-pay

## 2014-09-08 ENCOUNTER — Ambulatory Visit: Payer: Medicaid Other

## 2014-09-08 ENCOUNTER — Ambulatory Visit
Admission: RE | Admit: 2014-09-08 | Discharge: 2014-09-08 | Disposition: A | Discharge status: Home / Self Care | Payer: Medicaid Other | Source: Ambulatory Visit | Attending: Radiation Oncology | Admitting: Radiation Oncology

## 2014-09-08 DIAGNOSIS — Z510 Encounter for antineoplastic radiation therapy: Secondary | ICD-10-CM | POA: Diagnosis not present

## 2014-09-09 ENCOUNTER — Ambulatory Visit: Payer: Medicaid Other

## 2014-09-09 ENCOUNTER — Other Ambulatory Visit: Payer: Self-pay

## 2014-09-10 ENCOUNTER — Telehealth: Payer: Self-pay | Admitting: *Deleted

## 2014-09-10 NOTE — Telephone Encounter (Signed)
Patient called with questions regarding her Neulasta and Claritin.  Informed that the type of Neulasta injection was her choice per Tillie Rung, RN, and to start Claritin 10 mg by mouth the day before she gets her injection, the day of the injection and the day after the injection.  States she starts her chemo on Tuesday.  To call if she has any other questions or needs.

## 2014-09-13 ENCOUNTER — Encounter: Payer: Self-pay | Admitting: General Surgery

## 2014-09-13 ENCOUNTER — Ambulatory Visit (INDEPENDENT_AMBULATORY_CARE_PROVIDER_SITE_OTHER): Payer: Medicaid Other | Admitting: General Surgery

## 2014-09-13 ENCOUNTER — Other Ambulatory Visit: Payer: Self-pay

## 2014-09-13 ENCOUNTER — Ambulatory Visit: Payer: Self-pay

## 2014-09-13 ENCOUNTER — Ambulatory Visit: Payer: Self-pay | Admitting: Oncology

## 2014-09-13 VITALS — BP 122/78 | HR 89 | Resp 14 | Ht 66.0 in | Wt 239.0 lb

## 2014-09-13 DIAGNOSIS — C50912 Malignant neoplasm of unspecified site of left female breast: Secondary | ICD-10-CM

## 2014-09-13 NOTE — Progress Notes (Signed)
Patient ID: Robin Howard, female   DOB: March 25, 1960, 54 y.o.   MRN: [id number]  Chief Complaint  Patient presents with  . Follow-up    Robin Howard is a 54 y.o. female Here today for postoperative visit, left mammosite and port placement on 08-13-14. At last visit she had a reaction to the Tegaderm and was placed on a Medrol dose pack. She reports the area is improved. She did have her seat belt lock up on her and bruised the area. Her radiation was completed on 10/09/14. She starts her chemotherapy on 09/14/14.  HPI  Past Medical History  Diagnosis Date  . Major depressive disorder, recurrent   . HTN (hypertension)   . Overactive bladder   . Migraines   . Gastritis   . Hypothyroidism   . OSA (obstructive sleep apnea)     untreated due to lack of insurance  . Insomnia   . GERD (gastroesophageal reflux disease)   . Pericarditis 1997  . Detached retina 1997  . Chronic headache   . Morbid obesity   . NSTEMI (non-ST elevated myocardial infarction) 04/16/14    Due to demand- normal Cath 04/19/14  . Breast cancer   . Bronchitis 04-2014  . Shortness of breath dyspnea     with exertion only  . Anemia     h/o  . Pericarditis 1997  . Family history of adverse reaction to anesthesia     pts dad has pseudocholinestrace deficieny    Past Surgical History  Procedure Laterality Date  . Back surgery      L4-5  . Knee surgery    . Cesarean section    . Coronary angioplasty  April 2016  . Retinal detachment surgery Right 1997  . Foot surgery    . Breast lumpectomy with sentinel lymph node biopsy Left 08/13/2014    Procedure: BREAST LUMPECTOMY WITH SENTINEL LYMPH NODE BIOPSY;  Surgeon: Robert Bellow, MD;  Location: [facility];  Service: General;  Laterality: Left;  . Breast mammosite Left 08/30/2014    Procedure: MAMMOSITE BREAST;  Surgeon: Robert Bellow, MD;  Location: [facility];  Service: General;  Laterality: Left;  . Portacath placement Right 08/30/2014    Procedure: INSERTION  PORT-A-CATH;  Surgeon: Robert Bellow, MD;  Location: [facility];  Service: General;  Laterality: Right;    Family History  Problem Relation Age of Onset  . Alcohol abuse Mother   . Thyroid disease Mother   . Hypertension Mother   . COPD Mother   . Cancer Father     liposarcoma  . Congestive Heart Failure Father   . Depression Father   . Hypertension Father   . Cancer Maternal Uncle     Pancreatic cancer  . Heart disease Paternal Grandfather 70    MI  . Diabetes Other   . Cancer Sister     breast  . Breast cancer Sister 53    Social History Social History  Substance Use Topics  . Smoking status: Never Smoker   . Smokeless tobacco: Never Used  . Alcohol Use: No    Allergies  Allergen Reactions  . Reglan [Metoclopramide] Shortness Of Breath and Other (See Comments)    Dystonic Reaction  . Paxil [Paroxetine Hcl] Other (See Comments)    Blurred vision  . Tegaderm Ag Mesh [Silver] Rash  . Ultram [Tramadol] Itching and Rash    Current Outpatient Prescriptions  Medication Sig Dispense Refill  . aspirin EC 81 MG tablet Take 81 mg  by mouth daily.    . cefadroxil (DURICEF) 500 MG capsule Take 1 capsule (500 mg total) by mouth 2 (two) times daily. Start the evening after the procedure 08-30-14 20 capsule 0  . cholecalciferol (VITAMIN D) 1000 UNITS tablet Take 1,000 Units by mouth daily.    . diphenhydrAMINE (BENADRYL) 25 MG tablet Take 25 mg by mouth every 6 (six) hours as needed.    Marland Kitchen escitalopram (LEXAPRO) 20 MG tablet Take 1 tablet (20 mg total) by mouth daily. Stop the escitalopram (Patient taking differently: Take 20 mg by mouth every morning. Stop the escitalopram) 30 tablet 2  . HYDROcodone-acetaminophen (NORCO) 5-325 MG per tablet Take 1 tablet by mouth every 4 (four) hours as needed for moderate pain. 30 tablet 0  . levothyroxine (SYNTHROID, LEVOTHROID) 100 MCG tablet Take 1 tablet (100 mcg total) by mouth daily. 30 tablet 11  . lidocaine-prilocaine (EMLA) cream Apply  1 application topically once. Apply cream to port 1-2 hours prior to chemotherapy appointment, cover with plastic wrap. 30 g 0  . methylPREDNISolone (MEDROL) 4 MG TBPK tablet Take six tablets to start, decrease by one tablet each day. 21 tablet 0  . Multiple Vitamin (MULTIVITAMIN) capsule Take 1 capsule by mouth daily.    Marland Kitchen omeprazole (PRILOSEC) 40 MG capsule Take 1 capsule (40 mg total) by mouth daily. (Patient taking differently: Take 40 mg by mouth every morning. ) 30 capsule 3  . vitamin B-12 (CYANOCOBALAMIN) 1000 MCG tablet Take 1,000 mcg by mouth daily.     No current facility-administered medications for this visit.    Review of Systems Review of Systems  Constitutional: Negative.   Respiratory: Negative.   Cardiovascular: Negative.     Blood pressure 122/78, pulse 89, resp. rate 14, height 5\' 6"  (1.676 m), weight 239 lb (108.41 kg), last menstrual period 08/26/2012.  Physical Exam Physical Exam  Constitutional: She is oriented to person, place, and time. She appears well-developed and well-nourished.  Pulmonary/Chest:    Neurological: She is alert and oriented to person, place, and time.  Skin: Skin is warm and dry.  Psychiatric: She has a normal mood and affect.    Data Reviewed Patient to initiate chemotherapy tomorrow.  Assessment    Doing well status post MammoSite partial breast radiation.    Plan    The patient was encouraged to take 2 Tylenol and apply an ice pack to the port site prior to presentation for chemotherapy to minimize discomfort during needle insertion.  Follow-up examination here will take place in 3 months.    PCP: Roosevelt Locks 09/13/2014, 9:42 AM

## 2014-09-13 NOTE — Patient Instructions (Addendum)
Use an ice pack on port site prior to chemotherapy tomorrow. Follow up in 3 months. Call with any questions.

## 2014-09-14 ENCOUNTER — Inpatient Hospital Stay (HOSPITAL_BASED_OUTPATIENT_CLINIC_OR_DEPARTMENT_OTHER): Payer: Medicaid Other | Admitting: Oncology

## 2014-09-14 ENCOUNTER — Inpatient Hospital Stay: Payer: Medicaid Other

## 2014-09-14 VITALS — BP 141/86 | HR 85 | Temp 97.5°F | Resp 20 | Wt 239.2 lb

## 2014-09-14 VITALS — BP 125/81 | HR 81 | Resp 18

## 2014-09-14 DIAGNOSIS — N3281 Overactive bladder: Secondary | ICD-10-CM

## 2014-09-14 DIAGNOSIS — Z79899 Other long term (current) drug therapy: Secondary | ICD-10-CM | POA: Diagnosis not present

## 2014-09-14 DIAGNOSIS — E039 Hypothyroidism, unspecified: Secondary | ICD-10-CM

## 2014-09-14 DIAGNOSIS — F419 Anxiety disorder, unspecified: Secondary | ICD-10-CM

## 2014-09-14 DIAGNOSIS — C50912 Malignant neoplasm of unspecified site of left female breast: Secondary | ICD-10-CM

## 2014-09-14 DIAGNOSIS — I10 Essential (primary) hypertension: Secondary | ICD-10-CM

## 2014-09-14 DIAGNOSIS — Z171 Estrogen receptor negative status [ER-]: Secondary | ICD-10-CM | POA: Diagnosis not present

## 2014-09-14 DIAGNOSIS — K219 Gastro-esophageal reflux disease without esophagitis: Secondary | ICD-10-CM

## 2014-09-14 DIAGNOSIS — Z8669 Personal history of other diseases of the nervous system and sense organs: Secondary | ICD-10-CM

## 2014-09-14 DIAGNOSIS — R0609 Other forms of dyspnea: Secondary | ICD-10-CM

## 2014-09-14 DIAGNOSIS — I252 Old myocardial infarction: Secondary | ICD-10-CM

## 2014-09-14 DIAGNOSIS — Z803 Family history of malignant neoplasm of breast: Secondary | ICD-10-CM

## 2014-09-14 DIAGNOSIS — E669 Obesity, unspecified: Secondary | ICD-10-CM

## 2014-09-14 DIAGNOSIS — I319 Disease of pericardium, unspecified: Secondary | ICD-10-CM

## 2014-09-14 DIAGNOSIS — Z7982 Long term (current) use of aspirin: Secondary | ICD-10-CM | POA: Diagnosis not present

## 2014-09-14 DIAGNOSIS — G4730 Sleep apnea, unspecified: Secondary | ICD-10-CM

## 2014-09-14 DIAGNOSIS — F329 Major depressive disorder, single episode, unspecified: Secondary | ICD-10-CM

## 2014-09-14 DIAGNOSIS — C50512 Malignant neoplasm of lower-outer quadrant of left female breast: Secondary | ICD-10-CM | POA: Diagnosis not present

## 2014-09-14 DIAGNOSIS — G4700 Insomnia, unspecified: Secondary | ICD-10-CM

## 2014-09-14 DIAGNOSIS — Z5111 Encounter for antineoplastic chemotherapy: Secondary | ICD-10-CM | POA: Diagnosis not present

## 2014-09-14 LAB — CBC WITH DIFFERENTIAL/PLATELET
BASOS ABS: 0.1 10*3/uL (ref 0–0.1)
Basophils Relative: 2 %
EOS PCT: 3 %
Eosinophils Absolute: 0.2 10*3/uL (ref 0–0.7)
HCT: 36.5 % (ref 35.0–47.0)
Hemoglobin: 12.1 g/dL (ref 12.0–16.0)
LYMPHS PCT: 21 %
Lymphs Abs: 1.4 10*3/uL (ref 1.0–3.6)
MCH: 27.4 pg (ref 26.0–34.0)
MCHC: 33.1 g/dL (ref 32.0–36.0)
MCV: 82.8 fL (ref 80.0–100.0)
Monocytes Absolute: 0.8 10*3/uL (ref 0.2–0.9)
Monocytes Relative: 12 %
NEUTROS ABS: 4.2 10*3/uL (ref 1.4–6.5)
Neutrophils Relative %: 62 %
PLATELETS: 268 10*3/uL (ref 150–440)
RBC: 4.41 MIL/uL (ref 3.80–5.20)
RDW: 16.3 % — ABNORMAL HIGH (ref 11.5–14.5)
WBC: 6.8 10*3/uL (ref 3.6–11.0)

## 2014-09-14 LAB — COMPREHENSIVE METABOLIC PANEL
ALBUMIN: 3.8 g/dL (ref 3.5–5.0)
ALT: 19 U/L (ref 14–54)
AST: 29 U/L (ref 15–41)
Alkaline Phosphatase: 95 U/L (ref 38–126)
Anion gap: 8 (ref 5–15)
BUN: 11 mg/dL (ref 6–20)
CHLORIDE: 104 mmol/L (ref 101–111)
CO2: 25 mmol/L (ref 22–32)
CREATININE: 0.91 mg/dL (ref 0.44–1.00)
Calcium: 8.4 mg/dL — ABNORMAL LOW (ref 8.9–10.3)
GFR calc Af Amer: >60 mL/min (ref >60)
GLUCOSE: 104 mg/dL — AB (ref 65–99)
Potassium: 3.5 mmol/L (ref 3.5–5.1)
SODIUM: 137 mmol/L (ref 135–145)
Total Bilirubin: 0.7 mg/dL (ref 0.3–1.2)
Total Protein: 6.7 g/dL (ref 6.5–8.1)

## 2014-09-14 MED ORDER — ACETAMINOPHEN 325 MG PO TABS
650.0000 mg | ORAL_TABLET | Freq: Once | ORAL | Status: AC
  Administered 2014-09-14: 650 mg via ORAL
  Filled 2014-09-14: qty 2

## 2014-09-14 MED ORDER — ONDANSETRON HCL 4 MG PO TABS: 4.0000 mg | ORAL_TABLET | Freq: Three times a day (TID) | ORAL | Status: DC | PRN

## 2014-09-14 MED ORDER — TRASTUZUMAB CHEMO INJECTION 440 MG: 8.0000 mg/kg | Freq: Once | INTRAVENOUS | Status: DC

## 2014-09-14 MED ORDER — SODIUM CHLORIDE 0.9 % IV SOLN
880.0000 mg | Freq: Once | INTRAVENOUS | Status: AC
  Administered 2014-09-14: 880 mg via INTRAVENOUS
  Filled 2014-09-14: qty 41.91

## 2014-09-14 MED ORDER — PROCHLORPERAZINE MALEATE 10 MG PO TABS: 10.0000 mg | ORAL_TABLET | Freq: Four times a day (QID) | ORAL | Status: DC | PRN

## 2014-09-14 MED ORDER — SODIUM CHLORIDE 0.9 % IV SOLN
Freq: Once | INTRAVENOUS | Status: AC
  Administered 2014-09-14: 11:00:00 via INTRAVENOUS
  Filled 2014-09-14: qty 1000

## 2014-09-14 MED ORDER — DEXTROSE 5 % IV SOLN
75.0000 mg/m2 | Freq: Once | INTRAVENOUS | Status: AC
  Administered 2014-09-14: 170 mg via INTRAVENOUS
  Filled 2014-09-14: qty 17

## 2014-09-14 MED ORDER — SODIUM CHLORIDE 0.9 % IV SOLN
900.0000 mg | Freq: Once | INTRAVENOUS | Status: AC
  Administered 2014-09-14: 900 mg via INTRAVENOUS
  Filled 2014-09-14: qty 90

## 2014-09-14 MED ORDER — DIPHENHYDRAMINE HCL 25 MG PO CAPS
50.0000 mg | ORAL_CAPSULE | Freq: Once | ORAL | Status: AC
  Administered 2014-09-14: 50 mg via ORAL
  Filled 2014-09-14: qty 2

## 2014-09-14 MED ORDER — PEGFILGRASTIM 6 MG/0.6ML ~~LOC~~ PSKT
6.0000 mg | PREFILLED_SYRINGE | Freq: Once | SUBCUTANEOUS | Status: AC
  Administered 2014-09-14: 6 mg via SUBCUTANEOUS
  Filled 2014-09-14: qty 0.6

## 2014-09-14 MED ORDER — HEPARIN SOD (PORK) LOCK FLUSH 100 UNIT/ML IV SOLN
500.0000 [IU] | Freq: Once | INTRAVENOUS | Status: DC | PRN
  Filled 2014-09-14: qty 5

## 2014-09-14 MED ORDER — SODIUM CHLORIDE 0.9 % IV SOLN
Freq: Once | INTRAVENOUS | Status: AC
  Administered 2014-09-14: 11:00:00 via INTRAVENOUS
  Filled 2014-09-14: qty 8

## 2014-09-14 NOTE — Progress Notes (Signed)
Patient here today for ongoing follow up and treatment consideration regarding breast cancer. Patient denies any concerns today.  

## 2014-09-16 ENCOUNTER — Other Ambulatory Visit: Payer: Self-pay

## 2014-09-16 ENCOUNTER — Ambulatory Visit: Payer: Self-pay | Admitting: Oncology

## 2014-09-16 ENCOUNTER — Inpatient Hospital Stay: Payer: Self-pay

## 2014-09-16 ENCOUNTER — Ambulatory Visit: Payer: Self-pay

## 2014-09-17 ENCOUNTER — Telehealth: Payer: Self-pay | Admitting: *Deleted

## 2014-09-17 ENCOUNTER — Inpatient Hospital Stay: Payer: Medicaid Other | Attending: Oncology

## 2014-09-17 VITALS — BP 144/84 | HR 60 | Temp 97.0°F | Resp 18

## 2014-09-17 DIAGNOSIS — Z809 Family history of malignant neoplasm, unspecified: Secondary | ICD-10-CM | POA: Diagnosis not present

## 2014-09-17 DIAGNOSIS — R630 Anorexia: Secondary | ICD-10-CM | POA: Insufficient documentation

## 2014-09-17 DIAGNOSIS — E669 Obesity, unspecified: Secondary | ICD-10-CM | POA: Insufficient documentation

## 2014-09-17 DIAGNOSIS — Z8669 Personal history of other diseases of the nervous system and sense organs: Secondary | ICD-10-CM | POA: Diagnosis not present

## 2014-09-17 DIAGNOSIS — R531 Weakness: Secondary | ICD-10-CM | POA: Diagnosis not present

## 2014-09-17 DIAGNOSIS — R109 Unspecified abdominal pain: Secondary | ICD-10-CM | POA: Diagnosis not present

## 2014-09-17 DIAGNOSIS — R5383 Other fatigue: Secondary | ICD-10-CM | POA: Insufficient documentation

## 2014-09-17 DIAGNOSIS — F419 Anxiety disorder, unspecified: Secondary | ICD-10-CM | POA: Diagnosis not present

## 2014-09-17 DIAGNOSIS — K5900 Constipation, unspecified: Secondary | ICD-10-CM | POA: Insufficient documentation

## 2014-09-17 DIAGNOSIS — E039 Hypothyroidism, unspecified: Secondary | ICD-10-CM | POA: Diagnosis not present

## 2014-09-17 DIAGNOSIS — R197 Diarrhea, unspecified: Secondary | ICD-10-CM | POA: Diagnosis not present

## 2014-09-17 DIAGNOSIS — C50911 Malignant neoplasm of unspecified site of right female breast: Secondary | ICD-10-CM | POA: Insufficient documentation

## 2014-09-17 DIAGNOSIS — Z171 Estrogen receptor negative status [ER-]: Secondary | ICD-10-CM | POA: Diagnosis not present

## 2014-09-17 DIAGNOSIS — I252 Old myocardial infarction: Secondary | ICD-10-CM | POA: Diagnosis not present

## 2014-09-17 DIAGNOSIS — Z803 Family history of malignant neoplasm of breast: Secondary | ICD-10-CM | POA: Diagnosis not present

## 2014-09-17 DIAGNOSIS — K7689 Other specified diseases of liver: Secondary | ICD-10-CM | POA: Insufficient documentation

## 2014-09-17 DIAGNOSIS — K219 Gastro-esophageal reflux disease without esophagitis: Secondary | ICD-10-CM | POA: Diagnosis not present

## 2014-09-17 DIAGNOSIS — C50912 Malignant neoplasm of unspecified site of left female breast: Secondary | ICD-10-CM

## 2014-09-17 DIAGNOSIS — R0602 Shortness of breath: Secondary | ICD-10-CM | POA: Insufficient documentation

## 2014-09-17 DIAGNOSIS — I10 Essential (primary) hypertension: Secondary | ICD-10-CM | POA: Diagnosis not present

## 2014-09-17 DIAGNOSIS — Z5111 Encounter for antineoplastic chemotherapy: Secondary | ICD-10-CM | POA: Insufficient documentation

## 2014-09-17 DIAGNOSIS — I7090 Unspecified atherosclerosis: Secondary | ICD-10-CM | POA: Insufficient documentation

## 2014-09-17 DIAGNOSIS — G4730 Sleep apnea, unspecified: Secondary | ICD-10-CM | POA: Diagnosis not present

## 2014-09-17 DIAGNOSIS — I319 Disease of pericardium, unspecified: Secondary | ICD-10-CM | POA: Diagnosis not present

## 2014-09-17 DIAGNOSIS — Z418 Encounter for other procedures for purposes other than remedying health state: Secondary | ICD-10-CM | POA: Insufficient documentation

## 2014-09-17 DIAGNOSIS — F329 Major depressive disorder, single episode, unspecified: Secondary | ICD-10-CM | POA: Diagnosis not present

## 2014-09-17 MED ORDER — SODIUM CHLORIDE 0.9 % IV SOLN
Freq: Once | INTRAVENOUS | Status: AC
  Administered 2014-09-17: 10:00:00 via INTRAVENOUS
  Filled 2014-09-17: qty 1000

## 2014-09-17 MED ORDER — HEPARIN SOD (PORK) LOCK FLUSH 100 UNIT/ML IV SOLN
INTRAVENOUS | Status: AC
  Filled 2014-09-17: qty 5

## 2014-09-17 MED ORDER — SODIUM CHLORIDE 0.9 % IV SOLN
Freq: Once | INTRAVENOUS | Status: AC
  Administered 2014-09-17: 10:00:00 via INTRAVENOUS
  Filled 2014-09-17: qty 4

## 2014-09-17 MED ORDER — HEPARIN SOD (PORK) LOCK FLUSH 100 UNIT/ML IV SOLN
500.0000 [IU] | Freq: Once | INTRAVENOUS | Status: AC
Start: 2014-09-17 — End: 2014-09-17
  Administered 2014-09-17: 500 [IU] via INTRAVENOUS

## 2014-09-17 MED ORDER — SODIUM CHLORIDE 0.9 % IJ SOLN
10.0000 mL | INTRAMUSCULAR | Status: DC | PRN
  Administered 2014-09-17: 10 mL via INTRAVENOUS
  Filled 2014-09-17: qty 10

## 2014-09-17 NOTE — Telephone Encounter (Signed)
Patient called back and said son is coming to bring her in for IVF to [facility] coming at 48

## 2014-09-17 NOTE — Telephone Encounter (Signed)
Has had ha since yesterday and Oxycodone is not helping it. She is slightly light sensitive, not eating or drinking much, nauseated. Offered patient to come in for IVF, but she states she does not feel comfortable driving herself and does not have anyone to bring her in. Asking if she could just drink lots of water. I explained that she will most likely not be able to drink the amt needed being nauseated. She will think about coming in and call me back

## 2014-09-17 NOTE — Telephone Encounter (Signed)
Orders entered

## 2014-09-19 NOTE — Progress Notes (Signed)
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  Chief Complaint  Patient presents with  . Follow-up    breast cancer  . Chemotherapy    INTERVAL HISTORY:  Patient returns to clinic today for further evaluation and initiation of cycle 1 of 6 of Taxotere, carboplatinum, and Herceptin. She continues to be anxious, but otherwise feels well. She has no neurologic complaints. She denies any recent fevers or illnesses. She has a good appetite and denies weight loss. She has no chest pain or shortness of breath. She denies any nausea, vomiting, constipation, or diarrhea. She has no urinary complaints. Patient offers no further specific complaints.  REVIEW OF SYSTEMS:   Review of Systems  Constitutional: Negative.   Neurological: Negative.   Psychiatric/Behavioral: The patient is nervous/anxious.     As per HPI. Otherwise, a complete review of systems is negatve.  PAST MEDICAL HISTORY: Past Medical History  Diagnosis Date  . Major depressive disorder, recurrent   . HTN (hypertension)   . Overactive bladder   . Migraines   . Gastritis   . Hypothyroidism   . OSA (obstructive sleep apnea)     untreated due to lack of insurance  . Insomnia   . GERD (gastroesophageal reflux disease)   . Pericarditis 1997  . Detached retina 1997  . Chronic headache   . Morbid obesity   . NSTEMI (non-ST elevated myocardial infarction) 04/16/14    Due to demand- normal Cath 04/19/14  . Breast cancer   . Bronchitis 04-2014  . Shortness of breath dyspnea     with exertion only  . Anemia     h/o  . Pericarditis 1997  . Family history of  adverse reaction to anesthesia     pts dad has pseudocholinestrace deficieny    PAST SURGICAL HISTORY: Past Surgical History  Procedure Laterality Date  . Back surgery      L4-5  . Knee surgery    . Cesarean section    . Coronary angioplasty  April 2016  . Retinal detachment surgery Right 1997  . Foot surgery    . Breast lumpectomy with sentinel lymph node biopsy Left 08/13/2014    Procedure: BREAST LUMPECTOMY WITH SENTINEL LYMPH NODE BIOPSY;  Surgeon: Robert Bellow, MD;  Location: [facility];  Service: General;  Laterality: Left;  . Breast mammosite Left 08/30/2014    Procedure: MAMMOSITE BREAST;  Surgeon: Robert Bellow, MD;  Location: [facility];  Service: General;  Laterality: Left;  . Portacath placement Right 08/30/2014    Procedure: INSERTION PORT-A-CATH;  Surgeon: Robert Bellow, MD;  Location: [facility];  Service: General;  Laterality: Right;    FAMILY HISTORY Family History  Problem Relation Age of Onset  . Alcohol abuse Mother   . Thyroid disease Mother   . Hypertension Mother   . COPD Mother   . Cancer Father     liposarcoma  . Congestive Heart Failure Father   . Depression Father   . Hypertension Father   . Cancer Maternal Uncle     Pancreatic cancer  . Heart disease Paternal Grandfather 66    MI  . Diabetes Other   .  Cancer Sister     breast  . Breast cancer Sister 2       ADVANCED DIRECTIVES:    HEALTH MAINTENANCE: Social History  Substance Use Topics  . Smoking status: Never Smoker   . Smokeless tobacco: Never Used  . Alcohol Use: No     Colonoscopy:  PAP:  Bone density:  Lipid panel:  Allergies  Allergen Reactions  . Reglan [Metoclopramide] Shortness Of Breath and Other (See Comments)    Dystonic Reaction  . Paxil [Paroxetine Hcl] Other (See Comments)    Blurred vision  . Tegaderm Ag Mesh [Silver] Rash  . Ultram [Tramadol] Itching and Rash    Current Outpatient Prescriptions  Medication Sig Dispense Refill  . aspirin EC 81  MG tablet Take 81 mg by mouth daily.    . cholecalciferol (VITAMIN D) 1000 UNITS tablet Take 1,000 Units by mouth daily.    . diphenhydrAMINE (BENADRYL) 25 MG tablet Take 25 mg by mouth every 6 (six) hours as needed.    Marland Kitchen escitalopram (LEXAPRO) 20 MG tablet Take 1 tablet (20 mg total) by mouth daily. Stop the escitalopram (Patient taking differently: Take 20 mg by mouth every morning. Stop the escitalopram) 30 tablet 2  . HYDROcodone-acetaminophen (NORCO) 5-325 MG per tablet Take 1 tablet by mouth every 4 (four) hours as needed for moderate pain. 30 tablet 0  . levothyroxine (SYNTHROID, LEVOTHROID) 100 MCG tablet Take 1 tablet (100 mcg total) by mouth daily. 30 tablet 11  . lidocaine-prilocaine (EMLA) cream Apply 1 application topically once. Apply cream to port 1-2 hours prior to chemotherapy appointment, cover with plastic wrap. 30 g 0  . methylPREDNISolone (MEDROL) 4 MG TBPK tablet Take six tablets to start, decrease by one tablet each day. 21 tablet 0  . Multiple Vitamin (MULTIVITAMIN) capsule Take 1 capsule by mouth daily.    Marland Kitchen omeprazole (PRILOSEC) 40 MG capsule Take 1 capsule (40 mg total) by mouth daily. (Patient taking differently: Take 40 mg by mouth every morning. ) 30 capsule 3  . ondansetron (ZOFRAN) 4 MG tablet Take 1 tablet (4 mg total) by mouth every 8 (eight) hours as needed for nausea or vomiting. 30 tablet 2  . vitamin B-12 (CYANOCOBALAMIN) 1000 MCG tablet Take 1,000 mcg by mouth daily.    . cefadroxil (DURICEF) 500 MG capsule Take 1 capsule (500 mg total) by mouth 2 (two) times daily. Start the evening after the procedure 08-30-14 (Patient not taking: Reported on 09/14/2014) 20 capsule 0  . prochlorperazine (COMPAZINE) 10 MG tablet Take 1 tablet (10 mg total) by mouth every 6 (six) hours as needed for nausea or vomiting. 30 tablet 1   No current facility-administered medications for this visit.    OBJECTIVE: Filed Vitals:   09/14/14 0936  BP: 141/86  Pulse: 85  Temp: 97.5 F  (36.4 C)  Resp: 20     Body mass index is 38.63 kg/(m^2).    ECOG FS:0 - Asymptomatic  General: Well-developed, well-nourished, no acute distress. Eyes: Pink conjunctiva, anicteric sclera. Breasts: Exam deferred today. Lungs: Clear to auscultation bilaterally. Heart: Regular rate and rhythm. No rubs, murmurs, or gallops. Abdomen: Soft, nontender, nondistended. No organomegaly noted, normoactive bowel sounds. Musculoskeletal: No edema, cyanosis, or clubbing. Neuro: Alert, answering all questions appropriately. Cranial nerves grossly intact. Skin: No rashes or petechiae noted. Psych: Normal affect.   LAB RESULTS:  Lab Results  Component Value Date   NA 137 09/14/2014   K 3.5 09/14/2014   CL 104 09/14/2014  CO2 25 09/14/2014   GLUCOSE 104* 09/14/2014   BUN 11 09/14/2014   CREATININE 0.91 09/14/2014   CALCIUM 8.4* 09/14/2014   PROT 6.7 09/14/2014   ALBUMIN 3.8 09/14/2014   AST 29 09/14/2014   ALT 19 09/14/2014   ALKPHOS 95 09/14/2014   BILITOT 0.7 09/14/2014   GFRNONAA >60 09/14/2014   GFRAA >60 09/14/2014    Lab Results  Component Value Date   WBC 6.8 09/14/2014   NEUTROABS 4.2 09/14/2014   HGB 12.1 09/14/2014   HCT 36.5 09/14/2014   MCV 82.8 09/14/2014   PLT 268 09/14/2014     STUDIES: Nm Cardiac Muga Rest  08/31/2014   CLINICAL DATA:  Breast cancer. Evaluate cardiac function in relation to chemotherapy.  EXAM: NUCLEAR MEDICINE CARDIAC BLOOD POOL IMAGING (MUGA)  TECHNIQUE: Cardiac multi-gated acquisition was performed at rest following intravenous injection of Tc-63mlabeled red blood cells.  RADIOPHARMACEUTICALS:  21.6 MCi Tc-937mDP in-vitro labeled red blood cells IV  COMPARISON:  None.  FINDINGS: No  focal wall motion abnormality of the left ventricle.  Calculated left ventricular ejection fraction equals 52%  IMPRESSION: Left ventricular ejection fraction equals  52  %.   Electronically Signed   By: StSuzy Bouchard.D.   On: 08/31/2014 16:39   Dg Chest Port  1 View  08/30/2014   CLINICAL DATA:  Postop Port-A-Cath placement  EXAM: PORTABLE CHEST - 1 VIEW  COMPARISON:  Portable chest x-ray of 04/16/2014  FINDINGS: A right-sided Port-A-Cath has been inserted. The tip is difficult to visualize but appears to overlie the expected right atrium near the SVC -RA junction. No pneumothorax is seen. The lungs are clear. The heart is mildly enlarged and stable.  IMPRESSION: Right-sided Port-A-Cath tip overlies the right atrium. No pneumothorax.   Electronically Signed   By: PaIvar Drape.D.   On: 08/30/2014 13:50   Dg C-arm 1-60 Min-no Report  08/30/2014   CLINICAL DATA: port a cath insertion   C-ARM 1-60 MINUTES  Fluoroscopy was utilized by the requesting physician.  No radiographic  interpretation.    UsKoreareast Complete Uni Left Inc Axilla  08/26/2014   Ultrasound examination of the left breast in the 5:00 position at the site  of previous wide excision shows a small residual seroma cavity measuring  1.9 x 2.8 cm in maximum dimension. This is a minimum of 1.6 cm from the  overlying skin. This should represent an adequate for MammoSite balloon  placement.  Examination of the axilla the site of fullness post axillary node biopsy  showed no discernible seroma cavity.    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  PLAN:    1. Breast cancer: Given the fact that patient is HER-2 positive, she will benefit from adjuvant chemotherapy. Patient has now completed MammoSite radiation. Pretreatment MUGA reported an ejection fraction of 52% Proceed with cycle 1 of 6 of Taxotere, carboplatinum, and Herceptin. Patient also will require a total of 18 treatments of Herceptin every 3 weeks for a total of one year. Return to clinic tomorrow for Neulasta, in 1 week for laboratory work and further evaluation, and then in 3 weeks for consideration of cycle 2. She does not require an aromatase inhibitor given the fact that her tumor is  ER/PR negative.  2. Family history: BCRA 1 and 2 negative..  Patient expressed understanding and was in agreement with this plan. She also understands that She can call clinic at any  time with any questions, concerns, or complaints.   Breast cancer, female   Staging form: Breast, AJCC 7th Edition     Pathologic stage from 08/17/2014: Stage IA (T1c, N0, cM0) - Signed by Lloyd Huger, MD on 08/17/2014   Lloyd Huger, MD   09/19/2014 7:55 AM

## 2014-09-21 ENCOUNTER — Encounter: Payer: Self-pay | Admitting: Oncology

## 2014-09-21 ENCOUNTER — Inpatient Hospital Stay (HOSPITAL_BASED_OUTPATIENT_CLINIC_OR_DEPARTMENT_OTHER): Payer: Medicaid Other | Admitting: Oncology

## 2014-09-21 ENCOUNTER — Inpatient Hospital Stay: Payer: Medicaid Other

## 2014-09-21 ENCOUNTER — Ambulatory Visit
Admission: RE | Admit: 2014-09-21 | Discharge: 2014-09-21 | Disposition: A | Discharge status: Home / Self Care | Payer: Medicaid Other | Source: Ambulatory Visit | Attending: Oncology | Admitting: Oncology

## 2014-09-21 VITALS — BP 125/91 | HR 109 | Temp 98.6°F | Wt 232.8 lb

## 2014-09-21 DIAGNOSIS — Z171 Estrogen receptor negative status [ER-]: Secondary | ICD-10-CM

## 2014-09-21 DIAGNOSIS — R109 Unspecified abdominal pain: Secondary | ICD-10-CM

## 2014-09-21 DIAGNOSIS — Z803 Family history of malignant neoplasm of breast: Secondary | ICD-10-CM

## 2014-09-21 DIAGNOSIS — Z418 Encounter for other procedures for purposes other than remedying health state: Secondary | ICD-10-CM

## 2014-09-21 DIAGNOSIS — K5900 Constipation, unspecified: Secondary | ICD-10-CM

## 2014-09-21 DIAGNOSIS — F419 Anxiety disorder, unspecified: Secondary | ICD-10-CM

## 2014-09-21 DIAGNOSIS — C50912 Malignant neoplasm of unspecified site of left female breast: Secondary | ICD-10-CM | POA: Insufficient documentation

## 2014-09-21 DIAGNOSIS — R630 Anorexia: Secondary | ICD-10-CM

## 2014-09-21 DIAGNOSIS — F329 Major depressive disorder, single episode, unspecified: Secondary | ICD-10-CM

## 2014-09-21 DIAGNOSIS — Z809 Family history of malignant neoplasm, unspecified: Secondary | ICD-10-CM

## 2014-09-21 DIAGNOSIS — C50911 Malignant neoplasm of unspecified site of right female breast: Secondary | ICD-10-CM

## 2014-09-21 DIAGNOSIS — R531 Weakness: Secondary | ICD-10-CM

## 2014-09-21 DIAGNOSIS — R197 Diarrhea, unspecified: Secondary | ICD-10-CM | POA: Insufficient documentation

## 2014-09-21 DIAGNOSIS — R5383 Other fatigue: Secondary | ICD-10-CM

## 2014-09-21 DIAGNOSIS — E669 Obesity, unspecified: Secondary | ICD-10-CM

## 2014-09-21 DIAGNOSIS — Z5111 Encounter for antineoplastic chemotherapy: Secondary | ICD-10-CM | POA: Diagnosis not present

## 2014-09-21 DIAGNOSIS — I10 Essential (primary) hypertension: Secondary | ICD-10-CM

## 2014-09-21 DIAGNOSIS — R0602 Shortness of breath: Secondary | ICD-10-CM

## 2014-09-21 DIAGNOSIS — I252 Old myocardial infarction: Secondary | ICD-10-CM

## 2014-09-21 DIAGNOSIS — E039 Hypothyroidism, unspecified: Secondary | ICD-10-CM

## 2014-09-21 DIAGNOSIS — K219 Gastro-esophageal reflux disease without esophagitis: Secondary | ICD-10-CM

## 2014-09-21 DIAGNOSIS — Z8669 Personal history of other diseases of the nervous system and sense organs: Secondary | ICD-10-CM

## 2014-09-21 DIAGNOSIS — C801 Malignant (primary) neoplasm, unspecified: Secondary | ICD-10-CM

## 2014-09-21 DIAGNOSIS — G4730 Sleep apnea, unspecified: Secondary | ICD-10-CM

## 2014-09-21 DIAGNOSIS — I319 Disease of pericardium, unspecified: Secondary | ICD-10-CM

## 2014-09-21 LAB — COMPREHENSIVE METABOLIC PANEL
ALT: 65 U/L — AB (ref 14–54)
ANION GAP: 10 (ref 5–15)
AST: 63 U/L — ABNORMAL HIGH (ref 15–41)
Albumin: 4.1 g/dL (ref 3.5–5.0)
Alkaline Phosphatase: 121 U/L (ref 38–126)
BUN: 10 mg/dL (ref 6–20)
CHLORIDE: 101 mmol/L (ref 101–111)
CO2: 24 mmol/L (ref 22–32)
CREATININE: 1.08 mg/dL — AB (ref 0.44–1.00)
Calcium: 8.7 mg/dL — ABNORMAL LOW (ref 8.9–10.3)
GFR calc non Af Amer: 58 mL/min — ABNORMAL LOW (ref >60)
Glucose, Bld: 119 mg/dL — ABNORMAL HIGH (ref 65–99)
POTASSIUM: 3.5 mmol/L (ref 3.5–5.1)
SODIUM: 135 mmol/L (ref 135–145)
Total Bilirubin: 1 mg/dL (ref 0.3–1.2)
Total Protein: 7 g/dL (ref 6.5–8.1)

## 2014-09-21 LAB — CBC WITH DIFFERENTIAL/PLATELET
Basophils Absolute: 0 10*3/uL (ref 0–0.1)
Basophils Relative: 0 %
EOS ABS: 0.1 10*3/uL (ref 0–0.7)
Eosinophils Relative: 1 %
HCT: 38.5 % (ref 35.0–47.0)
Hemoglobin: 13 g/dL (ref 12.0–16.0)
LYMPHS ABS: 1 10*3/uL (ref 1.0–3.6)
Lymphocytes Relative: 18 %
MCH: 27.6 pg (ref 26.0–34.0)
MCHC: 33.8 g/dL (ref 32.0–36.0)
MCV: 81.7 fL (ref 80.0–100.0)
MONO ABS: 0.7 10*3/uL (ref 0.2–0.9)
Monocytes Relative: 12 %
Neutro Abs: 3.8 10*3/uL (ref 1.4–6.5)
Neutrophils Relative %: 69 %
PLATELETS: 188 10*3/uL (ref 150–440)
RBC: 4.71 MIL/uL (ref 3.80–5.20)
RDW: 15.9 % — AB (ref 11.5–14.5)
WBC: 5.5 10*3/uL (ref 3.6–11.0)

## 2014-09-21 MED ORDER — DIPHENOXYLATE-ATROPINE 2.5-0.025 MG PO TABS: 1.0000 | ORAL_TABLET | Freq: Four times a day (QID) | ORAL | Status: DC | PRN

## 2014-09-21 MED ORDER — HEPARIN SOD (PORK) LOCK FLUSH 100 UNIT/ML IV SOLN
500.0000 [IU] | Freq: Once | INTRAVENOUS | Status: AC
  Administered 2014-09-21: 500 [IU] via INTRAVENOUS

## 2014-09-21 MED ORDER — DIPHENOXYLATE-ATROPINE 2.5-0.025 MG PO TABS: 2.0000 | ORAL_TABLET | Freq: Four times a day (QID) | ORAL | Status: DC | PRN

## 2014-09-21 MED ORDER — OCTREOTIDE ACETATE 100 MCG/ML IJ SOLN
100.0000 ug | Freq: Once | INTRAMUSCULAR | Status: AC
  Administered 2014-09-21: 100 ug via SUBCUTANEOUS
  Filled 2014-09-21: qty 1

## 2014-09-21 MED ORDER — KETOROLAC TROMETHAMINE 30 MG/ML IJ SOLN
30.0000 mg | Freq: Once | INTRAMUSCULAR | Status: AC
  Administered 2014-09-21: 30 mg via INTRAVENOUS
  Filled 2014-09-21: qty 1

## 2014-09-21 MED ORDER — SODIUM CHLORIDE 0.9 % IV SOLN
Freq: Once | INTRAVENOUS | Status: AC
  Administered 2014-09-21: 14:00:00 via INTRAVENOUS
  Filled 2014-09-21: qty 1000

## 2014-09-22 ENCOUNTER — Telehealth: Payer: Self-pay | Admitting: *Deleted

## 2014-09-22 NOTE — Progress Notes (Signed)
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  Chief Complaint  Patient presents with  . Follow-up  . Abdominal Pain  . Breast Cancer    INTERVAL HISTORY:  Patient returns to clinic today for further evaluation and to assess her toleration of cycle 1 of 6 of Taxotere, carboplatinum, and Herceptin. She initially felt well for several days, but then developed constipation and abdominal pain. Over the weekend her constipation became severe diarrhea with up to 5 stools per day. She has a poor appetite. She has no neurologic complaints. She denies any fevers. She has no chest pain or shortness of breath. She has no urinary complaints. Patient feels generally terrible, offers no further specific complaints.  REVIEW OF SYSTEMS:   Review of Systems  Constitutional: Positive for malaise/fatigue.  Respiratory: Negative.   Cardiovascular: Negative.   Gastrointestinal: Positive for nausea, abdominal pain, diarrhea and constipation.  Neurological: Positive for weakness.  Psychiatric/Behavioral: The patient is nervous/anxious.     As per HPI. Otherwise, a complete review of systems is negatve.  PAST MEDICAL HISTORY: Past Medical History  Diagnosis Date  . Major depressive disorder, recurrent   . HTN (hypertension)   . Overactive bladder   . Migraines   . Gastritis   . Hypothyroidism   . OSA (obstructive sleep apnea)     untreated due to lack of insurance  . Insomnia   . GERD (gastroesophageal reflux disease)   . Pericarditis 1997  . Detached retina 1997  . Chronic headache   . Morbid obesity   . NSTEMI (non-ST  elevated myocardial infarction) 04/16/14    Due to demand- normal Cath 04/19/14  . Breast cancer   . Bronchitis 04-2014  . Shortness of breath dyspnea     with exertion only  . Anemia     h/o  . Pericarditis 1997  . Family history of adverse reaction to anesthesia     pts dad has pseudocholinestrace deficieny    PAST SURGICAL HISTORY: Past Surgical History  Procedure Laterality Date  . Back surgery      L4-5  . Knee surgery    . Cesarean section    . Coronary angioplasty  April 2016  . Retinal detachment surgery Right 1997  . Foot surgery    . Breast lumpectomy with sentinel lymph node biopsy Left 08/13/2014    Procedure: BREAST LUMPECTOMY WITH SENTINEL LYMPH NODE BIOPSY;  Surgeon: Jeffrey W Byrnett, MD;  Location: [facility] ORS;  Service: General;  Laterality: Left;  . Breast mammosite Left 08/30/2014    Procedure: MAMMOSITE BREAST;  Surgeon: Jeffrey W Byrnett, MD;  Location: [facility] ORS;  Service: General;  Laterality: Left;  . Portacath placement Right 08/30/2014    Procedure: INSERTION PORT-A-CATH;  Surgeon: Jeffrey W Byrnett, MD;  Location: [facility] ORS;  Service: General;  Laterality: Right;    FAMILY HISTORY Family History  Problem Relation Age of Onset  . Alcohol abuse Mother   . Thyroid disease Mother   . Hypertension Mother   . COPD Mother   . Cancer Father     liposarcoma  . Congestive Heart Failure Father   . Depression Father   .   Hypertension Father   . Cancer Maternal Uncle     Pancreatic cancer  . Heart disease Paternal Grandfather 89    MI  . Diabetes Other   . Cancer Sister     breast  . Breast cancer Sister 83       ADVANCED DIRECTIVES:    HEALTH MAINTENANCE: Social History  Substance Use Topics  . Smoking status: Never Smoker   . Smokeless tobacco: Never Used  . Alcohol Use: No     Colonoscopy:  PAP:  Bone density:  Lipid panel:  Allergies  Allergen Reactions  . Reglan [Metoclopramide] Shortness Of Breath and Other (See Comments)    Dystonic  Reaction  . Paxil [Paroxetine Hcl] Other (See Comments)    Blurred vision  . Tegaderm Ag Mesh [Silver] Rash  . Ultram [Tramadol] Itching and Rash    Current Outpatient Prescriptions  Medication Sig Dispense Refill  . aspirin EC 81 MG tablet Take 81 mg by mouth daily.    . cholecalciferol (VITAMIN D) 1000 UNITS tablet Take 1,000 Units by mouth daily.    . diphenhydrAMINE (BENADRYL) 25 MG tablet Take 25 mg by mouth every 6 (six) hours as needed.    Marland Kitchen escitalopram (LEXAPRO) 20 MG tablet Take 1 tablet (20 mg total) by mouth daily. Stop the escitalopram (Patient taking differently: Take 20 mg by mouth every morning. Stop the escitalopram) 30 tablet 2  . HYDROcodone-acetaminophen (NORCO) 5-325 MG per tablet Take 1 tablet by mouth every 4 (four) hours as needed for moderate pain. 30 tablet 0  . levothyroxine (SYNTHROID, LEVOTHROID) 100 MCG tablet Take 1 tablet (100 mcg total) by mouth daily. 30 tablet 11  . lidocaine-prilocaine (EMLA) cream Apply 1 application topically once. Apply cream to port 1-2 hours prior to chemotherapy appointment, cover with plastic wrap. 30 g 0  . methylPREDNISolone (MEDROL) 4 MG TBPK tablet Take six tablets to start, decrease by one tablet each day. 21 tablet 0  . Multiple Vitamin (MULTIVITAMIN) capsule Take 1 capsule by mouth daily.    Marland Kitchen omeprazole (PRILOSEC) 40 MG capsule Take 1 capsule (40 mg total) by mouth daily. (Patient taking differently: Take 40 mg by mouth every morning. ) 30 capsule 3  . ondansetron (ZOFRAN) 4 MG tablet Take 1 tablet (4 mg total) by mouth every 8 (eight) hours as needed for nausea or vomiting. 30 tablet 2  . prochlorperazine (COMPAZINE) 10 MG tablet Take 1 tablet (10 mg total) by mouth every 6 (six) hours as needed for nausea or vomiting. 30 tablet 1  . vitamin B-12 (CYANOCOBALAMIN) 1000 MCG tablet Take 1,000 mcg by mouth daily.    . cefadroxil (DURICEF) 500 MG capsule Take 1 capsule (500 mg total) by mouth 2 (two) times daily. Start the evening  after the procedure 08-30-14 (Patient not taking: Reported on 09/14/2014) 20 capsule 0  . diphenoxylate-atropine (LOMOTIL) 2.5-0.025 MG per tablet Take 2 tablets by mouth 4 (four) times daily as needed for diarrhea or loose stools. 60 tablet 2   No current facility-administered medications for this visit.    OBJECTIVE: Filed Vitals:   09/21/14 1128  BP: 125/91  Pulse: 109  Temp: 98.6 F (37 C)     Body mass index is 37.59 kg/(m^2).    ECOG FS:0 - Asymptomatic  General: Well-developed, well-nourished, no acute distress. Eyes: Pink conjunctiva, anicteric sclera. Breasts: Exam deferred today. Lungs: Clear to auscultation bilaterally. Heart: Regular rate and rhythm. No rubs, murmurs, or gallops. Abdomen: Soft, mildly tender, nondistended. normoactive bowel  sounds. Musculoskeletal: No edema, cyanosis, or clubbing. Neuro: Alert, answering all questions appropriately. Cranial nerves grossly intact. Skin: No rashes or petechiae noted. Psych: Normal affect.   LAB RESULTS:  Lab Results  Component Value Date   NA 135 09/21/2014   K 3.5 09/21/2014   CL 101 09/21/2014   CO2 24 09/21/2014   GLUCOSE 119* 09/21/2014   BUN 10 09/21/2014   CREATININE 1.08* 09/21/2014   CALCIUM 8.7* 09/21/2014   PROT 7.0 09/21/2014   ALBUMIN 4.1 09/21/2014   AST 63* 09/21/2014   ALT 65* 09/21/2014   ALKPHOS 121 09/21/2014   BILITOT 1.0 09/21/2014   GFRNONAA 58* 09/21/2014   GFRAA >60 09/21/2014    Lab Results  Component Value Date   WBC 5.5 09/21/2014   NEUTROABS 3.8 09/21/2014   HGB 13.0 09/21/2014   HCT 38.5 09/21/2014   MCV 81.7 09/21/2014   PLT 188 09/21/2014     STUDIES: Nm Cardiac Muga Rest  08/31/2014   CLINICAL DATA:  Breast cancer. Evaluate cardiac function in relation to chemotherapy.  EXAM: NUCLEAR MEDICINE CARDIAC BLOOD POOL IMAGING (MUGA)  TECHNIQUE: Cardiac multi-gated acquisition was performed at rest following intravenous injection of Tc-15mlabeled red blood cells.   RADIOPHARMACEUTICALS:  21.6 MCi Tc-923mDP in-vitro labeled red blood cells IV  COMPARISON:  None.  FINDINGS: No  focal wall motion abnormality of the left ventricle.  Calculated left ventricular ejection fraction equals 52%  IMPRESSION: Left ventricular ejection fraction equals  52  %.   Electronically Signed   By: StSuzy Bouchard.D.   On: 08/31/2014 16:39   Dg Chest Port 1 View  08/30/2014   CLINICAL DATA:  Postop Port-A-Cath placement  EXAM: PORTABLE CHEST - 1 VIEW  COMPARISON:  Portable chest x-ray of 04/16/2014  FINDINGS: A right-sided Port-A-Cath has been inserted. The tip is difficult to visualize but appears to overlie the expected right atrium near the SVC -RA junction. No pneumothorax is seen. The lungs are clear. The heart is mildly enlarged and stable.  IMPRESSION: Right-sided Port-A-Cath tip overlies the right atrium. No pneumothorax.   Electronically Signed   By: PaIvar Drape.D.   On: 08/30/2014 13:50   Dg Abd 2 Views  09/21/2014   CLINICAL DATA:  Abdominal pain and diarrhea for 3 days, currently receiving chemotherapy and recently finished radiation therapy for breast malignancy  EXAM: ABDOMEN - 2 VIEW  COMPARISON:  None.  FINDINGS: There are a few loops of minimally distended gas-filled small bowel in the mid abdomen. There is some gas and stool within colon. There is no gas in the rectum. There are no free extraluminal gas collections. There are phleboliths in the pelvis. The bony structures exhibit no acute abnormalities. The lung bases are clear.  IMPRESSION: Nonspecific bowel gas pattern. An early ileus may be present. There is no evidence of obstruction or perforation.   Electronically Signed   By: David  JoMartinique.D.   On: 09/21/2014 15:44   Dg C-arm 1-60 Min-no Report  08/30/2014   CLINICAL DATA: port a cath insertion   C-ARM 1-60 MINUTES  Fluoroscopy was utilized by the requesting physician.  No radiographic  interpretation.    UsKoreareast Complete Uni Left Inc Axilla  08/26/2014    Ultrasound examination of the left breast in the 5:00 position at the site  of previous wide excision shows a small residual seroma cavity measuring  1.9 x 2.8 cm in maximum dimension. This is a minimum of 1.6 cm from the  overlying skin. This should represent an adequate for MammoSite balloon  placement.  Examination of the axilla the site of fullness post axillary node biopsy  showed no discernible seroma cavity.    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  PLAN:    1. Breast cancer: Given the fact that patient is HER-2 positive, she will benefit from adjuvant chemotherapy. Patient has now completed MammoSite radiation. Pretreatment MUGA reported an ejection fraction of 52% patient received cycle 1 of 6 of Taxotere, carboplatinum, and Herceptin last week with significant side effects of abdominal pain and diarrhea. Patient also will require a total of 18 treatments of Herceptin every 3 weeks for a total of one year. Return to clinic in 2 weeks for consideration of cycle 2. She does not require an aromatase inhibitor given the fact that her tumor is ER/PR negative.  2. Family history: BCRA 1 and 2 negative. 3. Bony pain: Secondary to Neulasta, take Percocet as needed. 4. Diarrhea: Abdominal x-ray did not reveal any significant pathology, possibly early ileus. Patient was given a prescription for Lomotil and also received octreotide in clinic today. Possibly secondary to Taxotere which list a 23-43% incidence of diarrhea. Carboplatinum and Herceptin or less likely. Consider sending for C. difficile if diarrhea does not resolve in the next 1-2 days. 5. Abdominal pain: Possibly secondary to increased diarrhea. Patient was given IV Toradol in clinic today.  Patient expressed understanding and was in agreement with this plan. She also understands that She can call clinic at any time with any questions, concerns, or complaints.   Breast cancer, female    Staging form: Breast, AJCC 7th Edition     Pathologic stage from 08/17/2014: Stage IA (T1c, N0, cM0) - Signed by Lloyd Huger, MD on 08/17/2014   Lloyd Huger, MD   09/22/2014 8:49 AM

## 2014-09-22 NOTE — Telephone Encounter (Addendum)
EXAM: ABDOMEN - 2 VIEW  COMPARISON: None.  FINDINGS: There are a few loops of minimally distended gas-filled small bowel in the mid abdomen. There is some gas and stool within colon. There is no gas in the rectum. There are no free extraluminal gas collections. There are phleboliths in the pelvis. The bony structures exhibit no acute abnormalities. The lung bases are clear.  IMPRESSION: Nonspecific bowel gas pattern. An early ileus may be present. There is no evidence of obstruction or perforation.   Electronically Signed  By: David Martinique M.D.  On: 09/21/2014 15:44

## 2014-09-23 ENCOUNTER — Observation Stay
Admission: EM | Admit: 2014-09-23 | Discharge: 2014-09-24 | Disposition: A | Discharge status: Home / Self Care | Payer: Medicaid Other | Attending: Internal Medicine | Admitting: Internal Medicine

## 2014-09-23 ENCOUNTER — Encounter: Payer: Self-pay | Admitting: Emergency Medicine

## 2014-09-23 ENCOUNTER — Other Ambulatory Visit: Payer: Self-pay

## 2014-09-23 ENCOUNTER — Emergency Department: Payer: Medicaid Other

## 2014-09-23 DIAGNOSIS — G4733 Obstructive sleep apnea (adult) (pediatric): Secondary | ICD-10-CM | POA: Diagnosis not present

## 2014-09-23 DIAGNOSIS — R0602 Shortness of breath: Secondary | ICD-10-CM | POA: Insufficient documentation

## 2014-09-23 DIAGNOSIS — Z8249 Family history of ischemic heart disease and other diseases of the circulatory system: Secondary | ICD-10-CM | POA: Diagnosis not present

## 2014-09-23 DIAGNOSIS — D72829 Elevated white blood cell count, unspecified: Secondary | ICD-10-CM | POA: Insufficient documentation

## 2014-09-23 DIAGNOSIS — Z853 Personal history of malignant neoplasm of breast: Secondary | ICD-10-CM | POA: Diagnosis not present

## 2014-09-23 DIAGNOSIS — Z800 Family history of malignant neoplasm of digestive organs: Secondary | ICD-10-CM | POA: Insufficient documentation

## 2014-09-23 DIAGNOSIS — K2970 Gastritis, unspecified, without bleeding: Secondary | ICD-10-CM | POA: Diagnosis not present

## 2014-09-23 DIAGNOSIS — E039 Hypothyroidism, unspecified: Secondary | ICD-10-CM | POA: Diagnosis not present

## 2014-09-23 DIAGNOSIS — K529 Noninfective gastroenteritis and colitis, unspecified: Principal | ICD-10-CM | POA: Diagnosis present

## 2014-09-23 DIAGNOSIS — Z803 Family history of malignant neoplasm of breast: Secondary | ICD-10-CM | POA: Diagnosis not present

## 2014-09-23 DIAGNOSIS — N3281 Overactive bladder: Secondary | ICD-10-CM | POA: Insufficient documentation

## 2014-09-23 DIAGNOSIS — K7689 Other specified diseases of liver: Secondary | ICD-10-CM | POA: Insufficient documentation

## 2014-09-23 DIAGNOSIS — K219 Gastro-esophageal reflux disease without esophagitis: Secondary | ICD-10-CM | POA: Diagnosis not present

## 2014-09-23 DIAGNOSIS — R0600 Dyspnea, unspecified: Secondary | ICD-10-CM | POA: Insufficient documentation

## 2014-09-23 DIAGNOSIS — I252 Old myocardial infarction: Secondary | ICD-10-CM | POA: Insufficient documentation

## 2014-09-23 DIAGNOSIS — G43909 Migraine, unspecified, not intractable, without status migrainosus: Secondary | ICD-10-CM | POA: Diagnosis not present

## 2014-09-23 DIAGNOSIS — F339 Major depressive disorder, recurrent, unspecified: Secondary | ICD-10-CM | POA: Diagnosis present

## 2014-09-23 DIAGNOSIS — I10 Essential (primary) hypertension: Secondary | ICD-10-CM | POA: Insufficient documentation

## 2014-09-23 DIAGNOSIS — G4700 Insomnia, unspecified: Secondary | ICD-10-CM | POA: Diagnosis not present

## 2014-09-23 DIAGNOSIS — A09 Infectious gastroenteritis and colitis, unspecified: Secondary | ICD-10-CM

## 2014-09-23 DIAGNOSIS — Z6837 Body mass index (BMI) 37.0-37.9, adult: Secondary | ICD-10-CM | POA: Diagnosis not present

## 2014-09-23 DIAGNOSIS — Z825 Family history of asthma and other chronic lower respiratory diseases: Secondary | ICD-10-CM | POA: Insufficient documentation

## 2014-09-23 DIAGNOSIS — Z811 Family history of alcohol abuse and dependence: Secondary | ICD-10-CM | POA: Diagnosis not present

## 2014-09-23 DIAGNOSIS — F329 Major depressive disorder, single episode, unspecified: Secondary | ICD-10-CM | POA: Diagnosis not present

## 2014-09-23 DIAGNOSIS — Z8349 Family history of other endocrine, nutritional and metabolic diseases: Secondary | ICD-10-CM | POA: Insufficient documentation

## 2014-09-23 DIAGNOSIS — C50512 Malignant neoplasm of lower-outer quadrant of left female breast: Secondary | ICD-10-CM | POA: Diagnosis present

## 2014-09-23 DIAGNOSIS — R1084 Generalized abdominal pain: Secondary | ICD-10-CM

## 2014-09-23 DIAGNOSIS — Z79899 Other long term (current) drug therapy: Secondary | ICD-10-CM | POA: Insufficient documentation

## 2014-09-23 DIAGNOSIS — Z833 Family history of diabetes mellitus: Secondary | ICD-10-CM | POA: Diagnosis not present

## 2014-09-23 DIAGNOSIS — R5383 Other fatigue: Secondary | ICD-10-CM | POA: Diagnosis not present

## 2014-09-23 DIAGNOSIS — Z888 Allergy status to other drugs, medicaments and biological substances status: Secondary | ICD-10-CM | POA: Insufficient documentation

## 2014-09-23 LAB — PROTIME-INR
INR: 0.96
Prothrombin Time: 13 seconds (ref 11.4–15.0)

## 2014-09-23 LAB — COMPREHENSIVE METABOLIC PANEL
ALBUMIN: 3.9 g/dL (ref 3.5–5.0)
ALK PHOS: 145 U/L — AB (ref 38–126)
ALT: 66 U/L — AB (ref 14–54)
ANION GAP: 10 (ref 5–15)
AST: 62 U/L — AB (ref 15–41)
BUN: 9 mg/dL (ref 6–20)
CALCIUM: 9.1 mg/dL (ref 8.9–10.3)
CO2: 23 mmol/L (ref 22–32)
Chloride: 106 mmol/L (ref 101–111)
Creatinine, Ser: 1.07 mg/dL — ABNORMAL HIGH (ref 0.44–1.00)
GFR calc Af Amer: >60 mL/min (ref >60)
GFR calc non Af Amer: 59 mL/min — ABNORMAL LOW (ref >60)
GLUCOSE: 114 mg/dL — AB (ref 65–99)
Potassium: 3.5 mmol/L (ref 3.5–5.1)
SODIUM: 139 mmol/L (ref 135–145)
Total Bilirubin: 0.6 mg/dL (ref 0.3–1.2)
Total Protein: 6.7 g/dL (ref 6.5–8.1)

## 2014-09-23 LAB — CBC
HCT: 36.5 % (ref 35.0–47.0)
HEMOGLOBIN: 12 g/dL (ref 12.0–16.0)
MCH: 27.3 pg (ref 26.0–34.0)
MCHC: 32.8 g/dL (ref 32.0–36.0)
MCV: 83.2 fL (ref 80.0–100.0)
Platelets: 201 10*3/uL (ref 150–440)
RBC: 4.39 MIL/uL (ref 3.80–5.20)
RDW: 15.8 % — ABNORMAL HIGH (ref 11.5–14.5)
WBC: 11.9 10*3/uL — ABNORMAL HIGH (ref 3.6–11.0)

## 2014-09-23 LAB — URINALYSIS COMPLETE WITH MICROSCOPIC (ARMC ONLY)
BACTERIA UA: NONE SEEN
Bilirubin Urine: NEGATIVE
Glucose, UA: NEGATIVE mg/dL
Hgb urine dipstick: NEGATIVE
KETONES UR: NEGATIVE mg/dL
Leukocytes, UA: NEGATIVE
Nitrite: NEGATIVE
PROTEIN: NEGATIVE mg/dL
Specific Gravity, Urine: 1.015 (ref 1.005–1.030)
Squamous Epithelial / LPF: NONE SEEN
pH: 5 (ref 5.0–8.0)

## 2014-09-23 LAB — TROPONIN I

## 2014-09-23 LAB — APTT: APTT: 28 s (ref 24–36)

## 2014-09-23 LAB — LIPASE, BLOOD: Lipase: 35 U/L (ref 22–51)

## 2014-09-23 MED ORDER — IOHEXOL 350 MG/ML SOLN
100.0000 mL | Freq: Once | INTRAVENOUS | Status: AC | PRN
  Administered 2014-09-23: 100 mL via INTRAVENOUS

## 2014-09-23 MED ORDER — ESCITALOPRAM OXALATE 10 MG PO TABS
20.0000 mg | ORAL_TABLET | Freq: Once | ORAL | Status: AC
Start: 2014-09-23 — End: 2014-09-23
  Administered 2014-09-23: 21:00:00 20 mg via ORAL
  Filled 2014-09-23: qty 2

## 2014-09-23 MED ORDER — SODIUM CHLORIDE 0.9 % IV SOLN
Freq: Once | INTRAVENOUS | Status: AC
  Administered 2014-09-23: 17:00:00 via INTRAVENOUS

## 2014-09-23 MED ORDER — LEVOTHYROXINE SODIUM 100 MCG PO TABS
100.0000 ug | ORAL_TABLET | Freq: Every day | ORAL | Status: DC
  Administered 2014-09-24: 07:00:00 100 ug via ORAL
  Filled 2014-09-23: qty 1

## 2014-09-23 MED ORDER — ONDANSETRON HCL 4 MG/2ML IJ SOLN: 4.0000 mg | Freq: Four times a day (QID) | INTRAMUSCULAR | Status: DC | PRN

## 2014-09-23 MED ORDER — ESCITALOPRAM OXALATE 10 MG PO TABS
20.0000 mg | ORAL_TABLET | ORAL | Status: DC
  Administered 2014-09-24: 20 mg via ORAL
  Filled 2014-09-23: qty 2

## 2014-09-23 MED ORDER — ASPIRIN EC 81 MG PO TBEC
81.0000 mg | DELAYED_RELEASE_TABLET | Freq: Every day | ORAL | Status: DC
  Administered 2014-09-23 – 2014-09-24 (×2): 81 mg via ORAL
  Filled 2014-09-23 (×2): qty 1

## 2014-09-23 MED ORDER — HYDROCODONE-ACETAMINOPHEN 5-325 MG PO TABS
1.0000 | ORAL_TABLET | ORAL | Status: DC | PRN
  Administered 2014-09-23: 21:00:00 1 via ORAL
  Filled 2014-09-23: qty 1

## 2014-09-23 MED ORDER — CIPROFLOXACIN IN D5W 400 MG/200ML IV SOLN
400.0000 mg | Freq: Once | INTRAVENOUS | Status: AC
  Administered 2014-09-23: 400 mg via INTRAVENOUS
  Filled 2014-09-23: qty 200

## 2014-09-23 MED ORDER — DIPHENHYDRAMINE HCL 25 MG PO TABS
25.0000 mg | ORAL_TABLET | Freq: Four times a day (QID) | ORAL | Status: DC | PRN
  Administered 2014-09-24: 01:00:00 25 mg via ORAL
  Filled 2014-09-23 (×2): qty 1

## 2014-09-23 MED ORDER — IOHEXOL 240 MG/ML SOLN
25.0000 mL | Freq: Once | INTRAMUSCULAR | Status: DC | PRN
  Administered 2014-09-23: 25 mL via ORAL
  Filled 2014-09-23: qty 50

## 2014-09-23 MED ORDER — ACETAMINOPHEN 650 MG RE SUPP: 650.0000 mg | Freq: Four times a day (QID) | RECTAL | Status: DC | PRN

## 2014-09-23 MED ORDER — METRONIDAZOLE 500 MG PO TABS
500.0000 mg | ORAL_TABLET | Freq: Once | ORAL | Status: AC
  Administered 2014-09-23: 500 mg via ORAL
  Filled 2014-09-23: qty 1

## 2014-09-23 MED ORDER — SODIUM CHLORIDE 0.9 % IV BOLUS (SEPSIS)
1000.0000 mL | Freq: Once | INTRAVENOUS | Status: AC
  Administered 2014-09-23: 1000 mL via INTRAVENOUS

## 2014-09-23 MED ORDER — FAMOTIDINE 20 MG PO TABS
40.0000 mg | ORAL_TABLET | Freq: Once | ORAL | Status: AC
  Administered 2014-09-23: 40 mg via ORAL
  Filled 2014-09-23: qty 2

## 2014-09-23 MED ORDER — GI COCKTAIL ~~LOC~~
30.0000 mL | ORAL | Status: AC
  Administered 2014-09-23: 30 mL via ORAL
  Filled 2014-09-23: qty 30

## 2014-09-23 MED ORDER — ONDANSETRON HCL 4 MG PO TABS
4.0000 mg | ORAL_TABLET | Freq: Four times a day (QID) | ORAL | Status: DC | PRN
Start: 2014-09-23 — End: 2014-09-24

## 2014-09-23 MED ORDER — ACETAMINOPHEN 325 MG PO TABS
650.0000 mg | ORAL_TABLET | Freq: Four times a day (QID) | ORAL | Status: DC | PRN
  Administered 2014-09-24: 07:00:00 650 mg via ORAL
  Filled 2014-09-23: qty 2

## 2014-09-23 MED ORDER — PANTOPRAZOLE SODIUM 40 MG PO TBEC
40.0000 mg | DELAYED_RELEASE_TABLET | Freq: Every day | ORAL | Status: DC
  Administered 2014-09-24: 07:00:00 40 mg via ORAL
  Filled 2014-09-23: qty 1

## 2014-09-23 MED ORDER — SODIUM CHLORIDE 0.9 % IV SOLN
INTRAVENOUS | Status: AC
  Administered 2014-09-23: 19:00:00 via INTRAVENOUS

## 2014-09-23 NOTE — H&P (Signed)
[facility] [place] NAME: Robin Howard    MR#:  [id number]  DATE OF BIRTH:  Jun 22, 1960  DATE OF ADMISSION:  09/23/2014  PRIMARY CARE PHYSICIAN: Enid Derry, MD   REQUESTING/REFERRING PHYSICIAN: Kerman Passey, MD  CHIEF COMPLAINT:   Chief Complaint  Patient presents with  . Abdominal Pain  . Rectal Bleeding    HISTORY OF PRESENT ILLNESS:  Robin Howard  is a 54 y.o. female who presents with abdominal pain and one episode of blood in her stool. Patient has also had frequent episodes of diarrhea. The symptoms began about 3-4 days ago. Nothing is really exacerbated or improved her symptoms, except yesterday when she took a dose of Lomotil and has not had diarrhea since then. Of note patient is actively being treated for breast cancer with chemotherapy and had recent chemotherapy within the last week. However, she states that her diarrhea did not start until 3-4 days after her chemotherapy session. She also states that she had recent radiation, and was taking an antibiotic for about 2 weeks during that process. Her radiation finished about 2 weeks ago. On evaluation in the ED she was found to have a mildly elevated white blood cell count, and CT abdomen and pelvis showed sigmoid colitis. Hospitalists were called for admission  PAST MEDICAL HISTORY:   Past Medical History  Diagnosis Date  . Major depressive disorder, recurrent   . HTN (hypertension)   . Overactive bladder   . Migraines   . Gastritis   . Hypothyroidism   . OSA (obstructive sleep apnea)     untreated due to lack of insurance  . Insomnia   . GERD (gastroesophageal reflux disease)   . Pericarditis 1997  . Detached retina 1997  . Chronic headache   . Morbid obesity   . NSTEMI (non-ST elevated myocardial infarction) 04/16/14    Due to demand- normal Cath 04/19/14  . Breast cancer   . Bronchitis 04-2014  . Shortness of breath dyspnea     with exertion only  . Anemia     h/o   . Pericarditis 1997  . Family history of adverse reaction to anesthesia     pts dad has pseudocholinestrace deficieny    PAST SURGICAL HISTORY:   Past Surgical History  Procedure Laterality Date  . Back surgery      L4-5  . Knee surgery    . Cesarean section    . Coronary angioplasty  April 2016  . Retinal detachment surgery Right 1997  . Foot surgery    . Breast lumpectomy with sentinel lymph node biopsy Left 08/13/2014    Procedure: BREAST LUMPECTOMY WITH SENTINEL LYMPH NODE BIOPSY;  Surgeon: Robert Bellow, MD;  Location: [facility] ORS;  Service: General;  Laterality: Left;  . Breast mammosite Left 08/30/2014    Procedure: MAMMOSITE BREAST;  Surgeon: Robert Bellow, MD;  Location: [facility] ORS;  Service: General;  Laterality: Left;  . Portacath placement Right 08/30/2014    Procedure: INSERTION PORT-A-CATH;  Surgeon: Robert Bellow, MD;  Location: [facility] ORS;  Service: General;  Laterality: Right;    SOCIAL HISTORY:   Social History  Substance Use Topics  . Smoking status: Never Smoker   . Smokeless tobacco: Never Used  . Alcohol Use: No    FAMILY HISTORY:   Family History  Problem Relation Age of Onset  . Alcohol abuse Mother   . Thyroid disease Mother   . Hypertension Mother   . COPD  Mother   . Cancer Father     liposarcoma  . Congestive Heart Failure Father   . Depression Father   . Hypertension Father   . Cancer Maternal Uncle     Pancreatic cancer  . Heart disease Paternal Grandfather 84    MI  . Diabetes Other   . Cancer Sister     breast  . Breast cancer Sister 10    DRUG ALLERGIES:   Allergies  Allergen Reactions  . Reglan [Metoclopramide] Shortness Of Breath and Other (See Comments)    Dystonic Reaction  . Paxil [Paroxetine Hcl] Other (See Comments)    Blurred vision  . Tegaderm Ag Mesh [Silver] Rash  . Ultram [Tramadol] Itching and Rash    MEDICATIONS AT HOME:   Prior to Admission medications   Medication Sig Start Date End Date Taking?  Authorizing Provider  aspirin EC 81 MG tablet Take 81 mg by mouth daily.   Yes Historical Provider, MD  cholecalciferol (VITAMIN D) 1000 UNITS tablet Take 1,000 Units by mouth daily.   Yes Historical Provider, MD  diphenhydrAMINE (BENADRYL) 25 MG tablet Take 25 mg by mouth every 6 (six) hours as needed for allergies.    Yes Historical Provider, MD  diphenoxylate-atropine (LOMOTIL) 2.5-0.025 MG per tablet Take 2 tablets by mouth 4 (four) times daily as needed for diarrhea or loose stools. 09/21/14  Yes Lloyd Huger, MD  escitalopram (LEXAPRO) 20 MG tablet Take 1 tablet (20 mg total) by mouth daily. Stop the escitalopram Patient taking differently: Take 20 mg by mouth every morning. Stop the escitalopram 07/15/14  Yes Arnetha Courser, MD  HYDROcodone-acetaminophen (NORCO) 5-325 MG per tablet Take 1 tablet by mouth every 4 (four) hours as needed for moderate pain. 08/30/14  Yes Robert Bellow, MD  levothyroxine (SYNTHROID, LEVOTHROID) 100 MCG tablet Take 1 tablet (100 mcg total) by mouth daily. 08/04/14  Yes Arnetha Courser, MD  lidocaine-prilocaine (EMLA) cream Apply 1 application topically once. Apply cream to port 1-2 hours prior to chemotherapy appointment, cover with plastic wrap. 08/31/14  Yes Lloyd Huger, MD  Multiple Vitamin (MULTIVITAMIN) capsule Take 1 capsule by mouth daily.   Yes Historical Provider, MD  omeprazole (PRILOSEC) 40 MG capsule Take 1 capsule (40 mg total) by mouth daily. Patient taking differently: Take 40 mg by mouth every morning.  06/22/14  Yes Megan P Johnson, DO  prochlorperazine (COMPAZINE) 10 MG tablet Take 1 tablet (10 mg total) by mouth every 6 (six) hours as needed for nausea or vomiting. 09/14/14  Yes Lloyd Huger, MD  vitamin B-12 (CYANOCOBALAMIN) 1000 MCG tablet Take 1,000 mcg by mouth daily.   Yes Historical Provider, MD    REVIEW OF SYSTEMS:  Review of Systems  Constitutional: Negative for fever, chills, weight loss and malaise/fatigue.  HENT:  Negative for ear pain, hearing loss and tinnitus.   Eyes: Negative for blurred vision, double vision, pain and redness.  Respiratory: Negative for cough, hemoptysis and shortness of breath.   Cardiovascular: Negative for chest pain, palpitations, orthopnea and leg swelling.  Gastrointestinal: Positive for nausea, abdominal pain, diarrhea and blood in stool. Negative for vomiting and constipation.  Genitourinary: Negative for dysuria, frequency and hematuria.  Musculoskeletal: Negative for back pain, joint pain and neck pain.  Skin:       No acne, rash, or lesions  Neurological: Negative for dizziness, tremors, focal weakness and weakness.  Endo/Heme/Allergies: Negative for polydipsia. Does not bruise/bleed easily.  Psychiatric/Behavioral: Negative for depression. The patient  is not nervous/anxious and does not have insomnia.      VITAL SIGNS:   Filed Vitals:   09/23/14 1330 09/23/14 1400 09/23/14 1430 09/23/14 1500  BP: 126/93 142/110 139/90 129/79  Pulse: 72 71 78 67  Temp:      TempSrc:      Resp: 16 13 23 17   Height:      Weight:      SpO2: 100% 100% 96% 100%   Wt Readings from Last 3 Encounters:  09/23/14 104.781 kg (231 lb)  09/21/14 105.6 kg (232 lb 12.9 oz)  09/14/14 108.5 kg (239 lb 3.2 oz)    PHYSICAL EXAMINATION:  Physical Exam  Vitals reviewed. Constitutional: She is oriented to person, place, and time. She appears well-developed and well-nourished. No distress.  HENT:  Head: Normocephalic and atraumatic.  Mouth/Throat: Oropharynx is clear and moist.  Eyes: Conjunctivae and EOM are normal. Pupils are equal, round, and reactive to light. No scleral icterus.  Neck: Normal range of motion. Neck supple. No JVD present. No thyromegaly present.  Cardiovascular: Normal rate, regular rhythm and intact distal pulses.  Exam reveals no gallop and no friction rub.   No murmur heard. Respiratory: Effort normal and breath sounds normal. No respiratory distress. She has no  wheezes. She has no rales.  GI: Soft. Bowel sounds are normal. She exhibits no distension. There is tenderness (lower abdomen).  Musculoskeletal: Normal range of motion. She exhibits no edema.  No arthritis, no gout  Lymphadenopathy:    She has no cervical adenopathy.  Neurological: She is alert and oriented to person, place, and time. No cranial nerve deficit.  No dysarthria, no aphasia  Skin: Skin is warm and dry. No rash noted. No erythema.  Psychiatric: She has a normal mood and affect. Her behavior is normal. Judgment and thought content normal.    LABORATORY PANEL:   CBC  Recent Labs Lab 09/23/14 1129  WBC 11.9*  HGB 12.0  HCT 36.5  PLT 201   ------------------------------------------------------------------------------------------------------------------  Chemistries   Recent Labs Lab 09/23/14 1129  NA 139  K 3.5  CL 106  CO2 23  GLUCOSE 114*  BUN 9  CREATININE 1.07*  CALCIUM 9.1  AST 62*  ALT 66*  ALKPHOS 145*  BILITOT 0.6   ------------------------------------------------------------------------------------------------------------------  Cardiac Enzymes  Recent Labs Lab 09/23/14 1129  TROPONINI <0.03   ------------------------------------------------------------------------------------------------------------------  RADIOLOGY:  Ct Abdomen Pelvis W Contrast  09/23/2014   CLINICAL DATA:  Had chemotherapy last Tuesday. Abdominal pain last Thursday. Diarrhea.  EXAM: CT ABDOMEN AND PELVIS WITH CONTRAST  TECHNIQUE: Multidetector CT imaging of the abdomen and pelvis was performed using the standard protocol following bolus administration of intravenous contrast.  CONTRAST:  169mL OMNIPAQUE IOHEXOL 350 MG/ML SOLN  COMPARISON:  None.  FINDINGS: Lower chest:  Lung bases are clear.  Normal heart size.  Hepatobiliary: 5 mm hypodensity in the right hepatic lobe near the dome of the liver too small to characterize, likely representing a small cyst. No other focal  hepatic mass. Normal gallbladder. No intrahepatic or extrahepatic biliary ductal dilatation.  Pancreas: Normal.  Spleen: Normal.  Adrenals/Urinary Tract: Normal adrenal glands. Normal kidneys. No urolithiasis or obstructive uropathy. Normal bladder.  Stomach/Bowel: No bowel dilatation to suggest obstruction. Normal appendix. Mild bowel wall thickening involving the distal descending colon and sigmoid colon concerning for mild colitis. No pneumatosis, pneumoperitoneum or portal venous gas. No focal fluid collection to suggest an abscess.  Vascular/Lymphatic: Normal caliber abdominal aorta with mild atherosclerosis. No abdominal  or pelvic lymphadenopathy.  Reproductive: Normal uterus.  No adnexal mass.  Other: No fluid collection or hematoma.  Musculoskeletal: No lytic or sclerotic osseous lesion. Mild degenerative changes of the right SI joint. Abnormal articulation of the right L5 transverse process with the sacrum. No acute osseous abnormality. Bilateral facet arthropathy at L4-5 and L5-S1.  IMPRESSION: 1. Mild colitis of the distal descending and sigmoid colon likely secondary to an infectious or inflammatory etiology.   Electronically Signed   By: Kathreen Devoid   On: 09/23/2014 15:34    EKG:   Orders placed or performed during the hospital encounter of 09/23/14  . ED EKG  . ED EKG    IMPRESSION AND PLAN:  Principal Problem:   Colitis - unclear etiology at this time, with mildly elevated white blood cell count were treating her with antibiotics for possible infectious etiology. With recent antibiotic use we will check C. difficile. Active Problems:   Major depressive disorder, recurrent - continue home meds   Hypothyroidism - continue home dose thyroid replacement   GERD (gastroesophageal reflux disease) - continue home dose PPI   Breast cancer, female - we'll get an oncology consult to see her while she is here as they were the ones contacted from the ED and recommended possible admission  All  the records are reviewed and case discussed with ED provider. Management plans discussed with the patient and/or family.  DVT PROPHYLAXIS: SubQ lovenox  ADMISSION STATUS: Observation  CODE STATUS: Full  TOTAL TIME TAKING CARE OF THIS PATIENT: 40 minutes.    Rudra Hobbins FIELDING 09/23/2014, 4:53 PM  [facility]  Office  [phone]  CC: Primary care physician; Enid Derry, MD

## 2014-09-23 NOTE — ED Notes (Addendum)
Had chemo last Tuesday and began with abdominal pain last Thursday.  Started having diarrhea on Saturday.  Blood in stool X 1 last night; blood was bright red.  Patient reports dark stools prior to this. Currently being treated for breast cancer.  Received shot for diarrhea Tuesday this week.  Started lomotil yesterday.  No diarrhea today.  Went for xray Tuesday; per read possible early ileus. Patient appears very uncomfortable; tearful and unable to sit still related to pain.  Pain is mostly lower abdomen and back but has had upper abdominal pain as well.

## 2014-09-23 NOTE — ED Provider Notes (Signed)
-----------------------------------------   4:24 PM on 09/23/2014 -----------------------------------------  CT scan consistent with colitis. I discussed the patient with Dr. Netty Starring. Patient states she feels weak, and has no one to help her today and she would prefer to be admitted for her colitis. Dr. Grayland Ormond believe this was reasonable given the duration of diarrhea and her abdominal discomfort. Patient will be admitted to the hospitalist service. I ordered antibiotics and we will send blood cultures.  Harvest Dark, MD 09/23/14 [phone]

## 2014-09-23 NOTE — ED Provider Notes (Signed)
[facility] Emergency Department Provider Note  ____________________________________________  Time seen: 12:30 PM  I have reviewed the triage vital signs and the nursing notes.   HISTORY  Chief Complaint Abdominal Pain and Rectal Bleeding    HPI Robin Howard is a 54 y.o. female who complains of abdominal pain for one week that started 2 days after her most recent chemotherapy. She also started having diarrhea about 5 days ago and then last night she did had one episode of blood in her stool. She denies any chest pain shortness of breath dizziness fevers or chills. She is tolerating oral intake.     Past Medical History  Diagnosis Date  . Major depressive disorder, recurrent   . HTN (hypertension)   . Overactive bladder   . Migraines   . Gastritis   . Hypothyroidism   . OSA (obstructive sleep apnea)     untreated due to lack of insurance  . Insomnia   . GERD (gastroesophageal reflux disease)   . Pericarditis 1997  . Detached retina 1997  . Chronic headache   . Morbid obesity   . NSTEMI (non-ST elevated myocardial infarction) 04/16/14    Due to demand- normal Cath 04/19/14  . Breast cancer   . Bronchitis 04-2014  . Shortness of breath dyspnea     with exertion only  . Anemia     h/o  . Pericarditis 1997  . Family history of adverse reaction to anesthesia     pts dad has pseudocholinestrace deficieny     Patient Active Problem List   Diagnosis Date Noted  . Breast cancer, female 08/10/2014  . Migraine 07/15/2014  . Fatigue 07/15/2014  . Major depressive disorder, recurrent   . HTN (hypertension)   . Overactive bladder   . Hypothyroidism   . OSA (obstructive sleep apnea)   . GERD (gastroesophageal reflux disease)   . Chronic headache      Past Surgical History  Procedure Laterality Date  . Back surgery      L4-5  . Knee surgery    . Cesarean section    . Coronary angioplasty  April 2016  . Retinal detachment surgery Right 1997   . Foot surgery    . Breast lumpectomy with sentinel lymph node biopsy Left 08/13/2014    Procedure: BREAST LUMPECTOMY WITH SENTINEL LYMPH NODE BIOPSY;  Surgeon: Robert Bellow, MD;  Location: [facility] ORS;  Service: General;  Laterality: Left;  . Breast mammosite Left 08/30/2014    Procedure: MAMMOSITE BREAST;  Surgeon: Robert Bellow, MD;  Location: [facility] ORS;  Service: General;  Laterality: Left;  . Portacath placement Right 08/30/2014    Procedure: INSERTION PORT-A-CATH;  Surgeon: Robert Bellow, MD;  Location: [facility] ORS;  Service: General;  Laterality: Right;     Current Outpatient Rx  Name  Route  Sig  Dispense  Refill  . aspirin EC 81 MG tablet   Oral   Take 81 mg by mouth daily.         . cefadroxil (DURICEF) 500 MG capsule   Oral   Take 1 capsule (500 mg total) by mouth 2 (two) times daily. Start the evening after the procedure 08-30-14 Patient not taking: Reported on 09/14/2014   20 capsule   0   . cholecalciferol (VITAMIN D) 1000 UNITS tablet   Oral   Take 1,000 Units by mouth daily.         . diphenhydrAMINE (BENADRYL) 25 MG tablet   Oral  Take 25 mg by mouth every 6 (six) hours as needed.         . diphenoxylate-atropine (LOMOTIL) 2.5-0.025 MG per tablet   Oral   Take 2 tablets by mouth 4 (four) times daily as needed for diarrhea or loose stools.   60 tablet   2   . escitalopram (LEXAPRO) 20 MG tablet   Oral   Take 1 tablet (20 mg total) by mouth daily. Stop the escitalopram Patient taking differently: Take 20 mg by mouth every morning. Stop the escitalopram   30 tablet   2   . HYDROcodone-acetaminophen (NORCO) 5-325 MG per tablet   Oral   Take 1 tablet by mouth every 4 (four) hours as needed for moderate pain.   30 tablet   0   . levothyroxine (SYNTHROID, LEVOTHROID) 100 MCG tablet   Oral   Take 1 tablet (100 mcg total) by mouth daily.   30 tablet   11   . lidocaine-prilocaine (EMLA) cream   Topical   Apply 1 application topically once.  Apply cream to port 1-2 hours prior to chemotherapy appointment, cover with plastic wrap.   30 g   0   . methylPREDNISolone (MEDROL) 4 MG TBPK tablet      Take six tablets to start, decrease by one tablet each day.   21 tablet   0   . Multiple Vitamin (MULTIVITAMIN) capsule   Oral   Take 1 capsule by mouth daily.         Marland Kitchen omeprazole (PRILOSEC) 40 MG capsule   Oral   Take 1 capsule (40 mg total) by mouth daily. Patient taking differently: Take 40 mg by mouth every morning.    30 capsule   3   . ondansetron (ZOFRAN) 4 MG tablet   Oral   Take 1 tablet (4 mg total) by mouth every 8 (eight) hours as needed for nausea or vomiting.   30 tablet   2   . prochlorperazine (COMPAZINE) 10 MG tablet   Oral   Take 1 tablet (10 mg total) by mouth every 6 (six) hours as needed for nausea or vomiting.   30 tablet   1   . vitamin B-12 (CYANOCOBALAMIN) 1000 MCG tablet   Oral   Take 1,000 mcg by mouth daily.            Allergies Reglan; Paxil; Tegaderm ag mesh; and Ultram   Family History  Problem Relation Age of Onset  . Alcohol abuse Mother   . Thyroid disease Mother   . Hypertension Mother   . COPD Mother   . Cancer Father     liposarcoma  . Congestive Heart Failure Father   . Depression Father   . Hypertension Father   . Cancer Maternal Uncle     Pancreatic cancer  . Heart disease Paternal Grandfather 65    MI  . Diabetes Other   . Cancer Sister     breast  . Breast cancer Sister 12    Social History Social History  Substance Use Topics  . Smoking status: Never Smoker   . Smokeless tobacco: Never Used  . Alcohol Use: No    Review of Systems  Constitutional:   No fever or chills. No weight changes Eyes:   No blurry vision or double vision.  ENT:   No sore throat. Cardiovascular:   No chest pain. Respiratory:   No dyspnea or cough. Gastrointestinal:   Generalized abdominal pain with diarrhea. No vomiting.  No  BRBPR or melena. Genitourinary:    Negative for dysuria, urinary retention, bloody urine, or difficulty urinating. Musculoskeletal:   Negative for back pain. No joint swelling or pain. Skin:   Negative for rash. Neurological:   Negative for headaches, focal weakness or numbness. Psychiatric:  No anxiety or depression.   Endocrine:  No hot/cold intolerance, changes in energy, or sleep difficulty.  10-point ROS otherwise negative.  ____________________________________________   PHYSICAL EXAM:  VITAL SIGNS: ED Triage Vitals  Enc Vitals Group     BP 09/23/14 1114 137/103 mmHg     Pulse Rate 09/23/14 1114 114     Resp 09/23/14 1114 20     Temp 09/23/14 1114 99.3 F (37.4 C)     Temp Source 09/23/14 1114 Oral     SpO2 09/23/14 1114 96 %     Weight 09/23/14 1114 231 lb (104.781 kg)     Height 09/23/14 1114 5\' 6"  (1.676 m)     Head Cir --      Peak Flow --      Pain Score 09/23/14 1122 5     Pain Loc --      Pain Edu? --      Excl. in [place]? --    Examined with nurse [place] at the bedside  Constitutional:   Alert and oriented. Well appearing and in no distress. Eyes:   No scleral icterus. No conjunctival pallor. PERRL. EOMI ENT   Head:   Normocephalic and atraumatic.   Nose:   No congestion/rhinnorhea. No septal hematoma   Mouth/Throat:   MMM, no pharyngeal erythema. No peritonsillar mass. No uvula shift.   Neck:   No stridor. No SubQ emphysema. No meningismus. Hematological/Lymphatic/Immunilogical:   No cervical lymphadenopathy. Cardiovascular:   RRR. Normal and symmetric distal pulses are present in all extremities. No murmurs, rubs, or gallops. Respiratory:   Normal respiratory effort without tachypnea nor retractions. Breath sounds are clear and equal bilaterally. No wheezes/rales/rhonchi. Gastrointestinal:  Mild generalized tenderness. No distention. There is no CVA tenderness.  No rebound, rigidity, or guarding. Rectal exam reveals only thin secretions, Hemoccult negative. No  hemorrhoids. Genitourinary:   deferred Musculoskeletal:   Nontender with normal range of motion in all extremities. No joint effusions.  No lower extremity tenderness.  No edema. Neurologic:   Normal speech and language.  CN 2-10 normal. Motor grossly intact. No pronator drift.  Normal gait. No gross focal neurologic deficits are appreciated.  Skin:    Skin is warm, dry and intact. No rash noted.  No petechiae, purpura, or bullae. Psychiatric:   Mood and affect are normal. Speech and behavior are normal. Patient exhibits appropriate insight and judgment.  ____________________________________________    LABS (pertinent positives/negatives) (all labs ordered are listed, but only abnormal results are displayed) Labs Reviewed  COMPREHENSIVE METABOLIC PANEL - Abnormal; Notable for the following:    Glucose, Bld 114 (*)    Creatinine, Ser 1.07 (*)    AST 62 (*)    ALT 66 (*)    Alkaline Phosphatase 145 (*)    GFR calc non Af Amer 59 (*)    All other components within normal limits  CBC - Abnormal; Notable for the following:    WBC 11.9 (*)    RDW 15.8 (*)    All other components within normal limits  LIPASE, BLOOD  TROPONIN I  PROTIME-INR  APTT  URINALYSIS COMPLETEWITH MICROSCOPIC ([facility] ONLY)   ____________________________________________   EKG  Interpreted by me Normal sinus rhythm rate of  85, normal axis intervals QRS and ST segments. There are diffuse T-wave inversions in 1-3 aVF and V2 through V6.  ____________________________________________    RADIOLOGY  CT abdomen and pelvis pending  ____________________________________________   PROCEDURES   ____________________________________________   INITIAL IMPRESSION / ASSESSMENT AND PLAN / ED COURSE  Pertinent labs & imaging results that were available during my care of the patient were reviewed by me and considered in my medical decision making (see chart for details).  Patient presents with generalized  abdominal pain with reassuring exam. Labs are unremarkable. We'll do CT of the abdomen pelvis.  EKG finding is nonspecific given the clinical picture and symptoms. Absolutely no chest pain or shortness of breath. She has breast cancer on chemotherapy, no other significant risk factors for PE. Low suspicion for ACS dissection or carditis. Troponin negative.  ----------------------------------------- 2:59 PM on 09/23/2014 -----------------------------------------  Patient feeling better. Lab results discussed with her. She is comfortable with following up with her oncology team. The patient signed out to Dr. Kerman Passey at 3:00 PM pending CT scan.     ____________________________________________   FINAL CLINICAL IMPRESSION(S) / ED DIAGNOSES  Final diagnoses:  Generalized abdominal pain      Carrie Mew, MD 09/23/14 1500

## 2014-09-23 NOTE — Telephone Encounter (Signed)
Called patient to check on her condition, she reports she had 10 diarrhea stools yesterday and the last one was a large explosive bloody bright red stool. I spoke with L Herring AGNP--C who said patient needs to go to the ER. I called patient back and informed her of needing to go to ER and she questioned that because she has not had bm this does she really need to go and I explained that it is suggested thatg she go so she can be checked . She stated "alright"

## 2014-09-24 LAB — CBC
HEMATOCRIT: 30.8 % — AB (ref 35.0–47.0)
Hemoglobin: 10.1 g/dL — ABNORMAL LOW (ref 12.0–16.0)
MCH: 27.4 pg (ref 26.0–34.0)
MCHC: 32.8 g/dL (ref 32.0–36.0)
MCV: 83.6 fL (ref 80.0–100.0)
PLATELETS: 158 10*3/uL (ref 150–440)
RBC: 3.69 MIL/uL — ABNORMAL LOW (ref 3.80–5.20)
RDW: 15.7 % — AB (ref 11.5–14.5)
WBC: 14.2 10*3/uL — ABNORMAL HIGH (ref 3.6–11.0)

## 2014-09-24 LAB — BASIC METABOLIC PANEL
Anion gap: 5 (ref 5–15)
BUN: 6 mg/dL (ref 6–20)
CHLORIDE: 108 mmol/L (ref 101–111)
CO2: 29 mmol/L (ref 22–32)
CREATININE: 0.9 mg/dL (ref 0.44–1.00)
Calcium: 8.6 mg/dL — ABNORMAL LOW (ref 8.9–10.3)
GFR calc Af Amer: >60 mL/min (ref >60)
GFR calc non Af Amer: >60 mL/min (ref >60)
GLUCOSE: 88 mg/dL (ref 65–99)
POTASSIUM: 3.6 mmol/L (ref 3.5–5.1)
SODIUM: 142 mmol/L (ref 135–145)

## 2014-09-24 LAB — C DIFFICILE QUICK SCREEN W PCR REFLEX
C DIFFICILE (CDIFF) INTERP: NEGATIVE
C Diff antigen: NEGATIVE
C Diff toxin: NEGATIVE

## 2014-09-24 MED ORDER — HYDROCODONE-ACETAMINOPHEN 5-325 MG PO TABS: 1.0000 | ORAL_TABLET | ORAL | Status: DC | PRN

## 2014-09-24 MED ORDER — METRONIDAZOLE 250 MG PO TABS: 250.0000 mg | ORAL_TABLET | Freq: Three times a day (TID) | ORAL | Status: DC

## 2014-09-24 MED ORDER — CIPROFLOXACIN IN D5W 400 MG/200ML IV SOLN
400.0000 mg | Freq: Two times a day (BID) | INTRAVENOUS | Status: DC
  Administered 2014-09-24 (×2): 400 mg via INTRAVENOUS
  Filled 2014-09-24 (×4): qty 200

## 2014-09-24 MED ORDER — ONDANSETRON 4 MG PO TBDP: 4.0000 mg | ORAL_TABLET | Freq: Three times a day (TID) | ORAL | Status: DC | PRN

## 2014-09-24 MED ORDER — METRONIDAZOLE IN NACL 5-0.79 MG/ML-% IV SOLN
500.0000 mg | Freq: Three times a day (TID) | INTRAVENOUS | Status: DC
  Administered 2014-09-24 (×3): 500 mg via INTRAVENOUS
  Filled 2014-09-24 (×5): qty 100

## 2014-09-24 MED ORDER — CIPROFLOXACIN HCL 500 MG PO TABS: 500.0000 mg | ORAL_TABLET | Freq: Two times a day (BID) | ORAL | Status: DC

## 2014-09-24 NOTE — Progress Notes (Signed)
Pt being discharged home, discharge and prescriptions reviewed with pt, states understanding, no noted complaints at discharge, pt tolerates soft diet

## 2014-09-24 NOTE — Discharge Instructions (Signed)
Diarrhea Diarrhea is watery poop (stool). It can make you feel weak, tired, thirsty, or give you a dry mouth (signs of dehydration). Watery poop is a sign of another problem, most often an infection. It often lasts 2-3 days. It can last longer if it is a sign of something serious. Take care of yourself as told by your doctor. HOME CARE   Drink 1 cup (8 ounces) of fluid each time you have watery poop.  Do not drink the following fluids:  Those that contain simple sugars (fructose, glucose, galactose, lactose, sucrose, maltose).  Sports drinks.  Fruit juices.  Whole milk products.  Sodas.  Drinks with caffeine (coffee, tea, soda) or alcohol.  Oral rehydration solution may be used if the doctor says it is okay. You may make your own solution. Follow this recipe:   - teaspoon table salt.   teaspoon baking soda.   teaspoon salt substitute containing potassium chloride.  1 tablespoons sugar.  1 liter (34 ounces) of water.  Avoid the following foods:  High fiber foods, such as raw fruits and vegetables.  Nuts, seeds, and whole grain breads and cereals.   Those that are sweetened with sugar alcohols (xylitol, sorbitol, mannitol).  Try eating the following foods:  Starchy foods, such as rice, toast, pasta, low-sugar cereal, oatmeal, baked potatoes, crackers, and bagels.  Bananas.  Applesauce.  Eat probiotic-rich foods, such as yogurt and milk products that are fermented.  Wash your hands well after each time you have watery poop.  Only take medicine as told by your doctor.  Take a warm bath to help lessen burning or pain from having watery poop. GET HELP RIGHT AWAY IF:   You cannot drink fluids without throwing up (vomiting).  You keep throwing up.  You have blood in your poop, or your poop looks black and tarry.  You do not pee (urinate) in 6-8 hours, or there is only a small amount of very dark pee.  You have belly (abdominal) pain that gets worse or stays  in the same spot (localizes).  You are weak, dizzy, confused, or light-headed.  You have a very bad headache.  Your watery poop gets worse or does not get better.  You have a fever or lasting symptoms for more than 2-3 days.  You have a fever and your symptoms suddenly get worse. MAKE SURE YOU:   Understand these instructions.  Will watch your condition.  Will get help right away if you are not doing well or get worse. Document Released: 06/20/2007 Document Revised: 05/18/2013 Document Reviewed: 09/09/2011 [facility] Patient Information 2015 [place], [place]. This information is not intended to replace advice given to you by your health care provider. Make sure you discuss any questions you have with your health care provider.  Colitis Colitis is inflammation of the colon. Colitis can be a short-term or long-standing (chronic) illness. Crohn's disease and ulcerative colitis are 2 types of colitis which are chronic. They usually require lifelong treatment. CAUSES  There are many different causes of colitis, including:  Viruses.  Germs (bacteria).  Medicine reactions. SYMPTOMS   Diarrhea.  Intestinal bleeding.  Pain.  Fever.  Throwing up (vomiting).  Tiredness (fatigue).  Weight loss.  Bowel blockage. DIAGNOSIS  The diagnosis of colitis is based on examination and stool or blood tests. X-rays, CT scan, and colonoscopy may also be needed. TREATMENT  Treatment may include:  Fluids given through the vein (intravenously).  Bowel rest (nothing to eat or drink for a period of  time).  Medicine for pain and diarrhea.  Medicines (antibiotics) that kill germs.  Cortisone medicines.  Surgery. HOME CARE INSTRUCTIONS   Get plenty of rest.  Drink enough water and fluids to keep your urine clear or pale yellow.  Eat a well-balanced diet.  Call your caregiver for follow-up as recommended. SEEK IMMEDIATE MEDICAL CARE IF:   You develop chills.  You have an oral  temperature above 102 F (38.9 C), not controlled by medicine.  You have extreme weakness, fainting, or dehydration.  You have repeated vomiting.  You develop severe belly (abdominal) pain or are passing bloody or tarry stools. MAKE SURE YOU:   Understand these instructions.  Will watch your condition.  Will get help right away if you are not doing well or get worse. Document Released: 02/09/2004 Document Revised: 03/26/2011 Document Reviewed: 05/06/2009 [facility] Patient Information 2015 [place], [place]. This information is not intended to replace advice given to you by your health care provider. Make sure you discuss any questions you have with your health care provider.

## 2014-09-24 NOTE — Plan of Care (Signed)
Problem: Discharge Progression Outcomes Goal: Other Discharge Outcomes/Goals Outcome: Progressing Pt alert and oriented. Norco given x 1 for back/abdominal pain. Relief noted. Benadryl given x 1 for itching, Relief noted. Independent to BR. Negative for C-Diff.

## 2014-09-24 NOTE — Discharge Summary (Signed)
[place] [facility] [place] NAME: Robin Howard    MR#:  [id number]  DATE OF BIRTH:  September 25, 1960  DATE OF ADMISSION:  09/23/2014 ADMITTING PHYSICIAN: Lance Coon, MD  DATE OF DISCHARGE: 09/24/2014  PRIMARY CARE PHYSICIAN: Enid Derry, MD    ADMISSION DIAGNOSIS:  Infectious colitis [A09] Generalized abdominal pain [R10.84]  DISCHARGE DIAGNOSIS:  Principal Problem:   Colitis Active Problems:   Major depressive disorder, recurrent   Hypothyroidism   GERD (gastroesophageal reflux disease)   Breast cancer, female   SECONDARY DIAGNOSIS:   Past Medical History  Diagnosis Date  . Major depressive disorder, recurrent   . HTN (hypertension)   . Overactive bladder   . Migraines   . Gastritis   . Hypothyroidism   . OSA (obstructive sleep apnea)     untreated due to lack of insurance  . Insomnia   . GERD (gastroesophageal reflux disease)   . Pericarditis 1997  . Detached retina 1997  . Chronic headache   . Morbid obesity   . NSTEMI (non-ST elevated myocardial infarction) 04/16/14    Due to demand- normal Cath 04/19/14  . Breast cancer   . Bronchitis 04-2014  . Shortness of breath dyspnea     with exertion only  . Anemia     h/o  . Pericarditis 1997  . Family history of adverse reaction to anesthesia     pts dad has [place] COURSE:   * Colitis - unclear etiology at this time, with mildly elevated white blood cell count     C diff negative.   She took 10 days of Abx after placign radiation capsules in her breast.   No more diarrhea/ fever/ nausea/ tolerated diet well.   Will give 5 days of cipro+ flagyl.  * Major depressive disorder, recurrent - continue home meds * Hypothyroidism - continue home dose thyroid replacement * GERD (gastroesophageal reflux disease) - continue home dose PPI * Breast cancer, female -advise to follow in office as per her schedule.   DISCHARGE CONDITIONS:    Stable.  CONSULTS OBTAINED:  Treatment Team:  Lloyd Huger, MD Leia Alf, MD  DRUG ALLERGIES:   Allergies  Allergen Reactions  . Reglan [Metoclopramide] Shortness Of Breath and Other (See Comments)    Dystonic Reaction  . Paxil [Paroxetine Hcl] Other (See Comments)    Blurred vision  . Tegaderm Ag Mesh [Silver] Rash  . Ultram [Tramadol] Itching and Rash    DISCHARGE MEDICATIONS:   Current Discharge Medication List    START taking these medications   Details  ciprofloxacin (CIPRO) 500 MG tablet Take 1 tablet (500 mg total) by mouth 2 (two) times daily. Qty: 10 tablet, Refills: 0    metroNIDAZOLE (FLAGYL) 250 MG tablet Take 1 tablet (250 mg total) by mouth 3 (three) times daily. Qty: 15 tablet, Refills: 0    ondansetron (ZOFRAN ODT) 4 MG disintegrating tablet Take 1 tablet (4 mg total) by mouth every 8 (eight) hours as needed for nausea or vomiting. Qty: 20 tablet, Refills: 0      CONTINUE these medications which have CHANGED   Details  HYDROcodone-acetaminophen (NORCO) 5-325 MG per tablet Take 1 tablet by mouth every 4 (four) hours as needed for moderate pain. Qty: 30 tablet, Refills: 0      CONTINUE these medications which have NOT CHANGED   Details  aspirin EC 81 MG tablet Take 81 mg by mouth daily.  cholecalciferol (VITAMIN D) 1000 UNITS tablet Take 1,000 Units by mouth daily.    diphenhydrAMINE (BENADRYL) 25 MG tablet Take 25 mg by mouth every 6 (six) hours as needed for allergies.     diphenoxylate-atropine (LOMOTIL) 2.5-0.025 MG per tablet Take 2 tablets by mouth 4 (four) times daily as needed for diarrhea or loose stools. Qty: 60 tablet, Refills: 2    escitalopram (LEXAPRO) 20 MG tablet Take 1 tablet (20 mg total) by mouth daily. Stop the escitalopram Qty: 30 tablet, Refills: 2    levothyroxine (SYNTHROID, LEVOTHROID) 100 MCG tablet Take 1 tablet (100 mcg total) by mouth daily. Qty: 30 tablet, Refills: 11    lidocaine-prilocaine (EMLA)  cream Apply 1 application topically once. Apply cream to port 1-2 hours prior to chemotherapy appointment, cover with plastic wrap. Qty: 30 g, Refills: 0    Multiple Vitamin (MULTIVITAMIN) capsule Take 1 capsule by mouth daily.    omeprazole (PRILOSEC) 40 MG capsule Take 1 capsule (40 mg total) by mouth daily. Qty: 30 capsule, Refills: 3    prochlorperazine (COMPAZINE) 10 MG tablet Take 1 tablet (10 mg total) by mouth every 6 (six) hours as needed for nausea or vomiting. Qty: 30 tablet, Refills: 1   Associated Diagnoses: Breast cancer, female, left    vitamin B-12 (CYANOCOBALAMIN) 1000 MCG tablet Take 1,000 mcg by mouth daily.         DISCHARGE INSTRUCTIONS:    Follow with oncology in office.  If you experience worsening of your admission symptoms, develop shortness of breath, life threatening emergency, suicidal or homicidal thoughts you must seek medical attention immediately by calling 911 or calling your MD immediately  if symptoms less severe.  You Must read complete instructions/literature along with all the possible adverse reactions/side effects for all the Medicines you take and that have been prescribed to you. Take any new Medicines after you have completely understood and accept all the possible adverse reactions/side effects.   Please note  You were cared for by a hospitalist during your hospital stay. If you have any questions about your discharge medications or the care you received while you were in the hospital after you are discharged, you can call the unit and asked to speak with the hospitalist on call if the hospitalist that took care of you is not available. Once you are discharged, your primary care physician will handle any further medical issues. Please note that NO REFILLS for any discharge medications will be authorized once you are discharged, as it is imperative that you return to your primary care physician (or establish a relationship with a primary care  physician if you do not have one) for your aftercare needs so that they can reassess your need for medications and monitor your lab values.    Today   CHIEF COMPLAINT:   Chief Complaint  Patient presents with  . Abdominal Pain  . Rectal Bleeding    HISTORY OF PRESENT ILLNESS:  Robin Howard  is a 54 y.o. female presents with abdominal pain and one episode of blood in her stool. Patient has also had frequent episodes of diarrhea. The symptoms began about 3-4 days ago. Nothing is really exacerbated or improved her symptoms, except yesterday when she took a dose of Lomotil and has not had diarrhea since then. Of note patient is actively being treated for breast cancer with chemotherapy and had recent chemotherapy within the last week. However, she states that her diarrhea did not start until 3-4 days after her chemotherapy  session. She also states that she had recent radiation, and was taking an antibiotic for about 2 weeks during that process. Her radiation finished about 2 weeks ago. On evaluation in the ED she was found to have a mildly elevated white blood cell count, and CT abdomen and pelvis showed sigmoid colitis. Hospitalists were called for admission  VITAL SIGNS:  Blood pressure 116/77, pulse 74, temperature 98 F (36.7 C), temperature source Oral, resp. rate 18, height 5\' 6"  (1.676 m), weight 104.781 kg (231 lb), SpO2 99 %.  I/O:   Intake/Output Summary (Last 24 hours) at 09/24/14 1424 Last data filed at 09/24/14 0725  Gross per 24 hour  Intake      0 ml  Output   1350 ml  Net  -1350 ml    PHYSICAL EXAMINATION:  GENERAL:  54 y.o.-year-old patient lying in the bed with no acute distress.  EYES: Pupils equal, round, reactive to light and accommodation. No scleral icterus. Extraocular muscles intact.  HEENT: Head atraumatic, normocephalic. Oropharynx and nasopharynx clear.  NECK:  Supple, no jugular venous distention. No thyroid enlargement, no tenderness.  LUNGS: Normal  breath sounds bilaterally, no wheezing, rales,rhonchi or crepitation. No use of accessory muscles of respiration.  CARDIOVASCULAR: S1, S2 normal. No murmurs, rubs, or gallops.  ABDOMEN: Soft, non-tender, non-distended. Bowel sounds present. No organomegaly or mass.  EXTREMITIES: No pedal edema, cyanosis, or clubbing.  NEUROLOGIC: Cranial nerves II through XII are intact. Muscle strength 5/5 in all extremities. Sensation intact. Gait not checked.  PSYCHIATRIC: The patient is alert and oriented x 3.  SKIN: No obvious rash, lesion, or ulcer.   DATA REVIEW:   CBC  Recent Labs Lab 09/24/14 0449  WBC 14.2*  HGB 10.1*  HCT 30.8*  PLT 158    Chemistries   Recent Labs Lab 09/23/14 1129 09/24/14 0449  NA 139 142  K 3.5 3.6  CL 106 108  CO2 23 29  GLUCOSE 114* 88  BUN 9 6  CREATININE 1.07* 0.90  CALCIUM 9.1 8.6*  AST 62*  --   ALT 66*  --   ALKPHOS 145*  --   BILITOT 0.6  --     Cardiac Enzymes  Recent Labs Lab 09/23/14 1129  TROPONINI <0.03    Microbiology Results  Results for orders placed or performed during the hospital encounter of 09/23/14  Blood culture (routine x 2)     Status: None (Preliminary result)   Collection Time: 09/23/14  4:26 PM  Result Value Ref Range Status   Specimen Description BLOOD RIGHT ARM  Final   Special Requests BOTTLES DRAWN AEROBIC AND ANAEROBIC 6CC  Final   Culture NO GROWTH < 24 HOURS  Final   Report Status PENDING  Incomplete  Blood culture (routine x 2)     Status: None (Preliminary result)   Collection Time: 09/23/14  5:30 PM  Result Value Ref Range Status   Specimen Description BLOOD RIGHT ARM  Final   Special Requests BOTTLES DRAWN AEROBIC AND ANAEROBIC 5CC  Final   Culture NO GROWTH < 24 HOURS  Final   Report Status PENDING  Incomplete  C difficile quick scan w PCR reflex     Status: None   Collection Time: 09/23/14  8:57 PM  Result Value Ref Range Status   C Diff antigen NEGATIVE NEGATIVE Final   C Diff toxin NEGATIVE  NEGATIVE Final   C Diff interpretation Negative for C. difficile  Final    RADIOLOGY:  Ct Abdomen Pelvis  W Contrast  09/23/2014   CLINICAL DATA:  Had chemotherapy last Tuesday. Abdominal pain last Thursday. Diarrhea.  EXAM: CT ABDOMEN AND PELVIS WITH CONTRAST  TECHNIQUE: Multidetector CT imaging of the abdomen and pelvis was performed using the standard protocol following bolus administration of intravenous contrast.  CONTRAST:  113mL OMNIPAQUE IOHEXOL 350 MG/ML SOLN  COMPARISON:  None.  FINDINGS: Lower chest:  Lung bases are clear.  Normal heart size.  Hepatobiliary: 5 mm hypodensity in the right hepatic lobe near the dome of the liver too small to characterize, likely representing a small cyst. No other focal hepatic mass. Normal gallbladder. No intrahepatic or extrahepatic biliary ductal dilatation.  Pancreas: Normal.  Spleen: Normal.  Adrenals/Urinary Tract: Normal adrenal glands. Normal kidneys. No urolithiasis or obstructive uropathy. Normal bladder.  Stomach/Bowel: No bowel dilatation to suggest obstruction. Normal appendix. Mild bowel wall thickening involving the distal descending colon and sigmoid colon concerning for mild colitis. No pneumatosis, pneumoperitoneum or portal venous gas. No focal fluid collection to suggest an abscess.  Vascular/Lymphatic: Normal caliber abdominal aorta with mild atherosclerosis. No abdominal or pelvic lymphadenopathy.  Reproductive: Normal uterus.  No adnexal mass.  Other: No fluid collection or hematoma.  Musculoskeletal: No lytic or sclerotic osseous lesion. Mild degenerative changes of the right SI joint. Abnormal articulation of the right L5 transverse process with the sacrum. No acute osseous abnormality. Bilateral facet arthropathy at L4-5 and L5-S1.  IMPRESSION: 1. Mild colitis of the distal descending and sigmoid colon likely secondary to an infectious or inflammatory etiology.   Electronically Signed   By: Kathreen Devoid   On: 09/23/2014 15:34    Management  plans discussed with the patient, family and they are in agreement.  CODE STATUS:     Code Status Orders        Start     Ordered   09/23/14 1846  Full code   Continuous     09/23/14 1845    Advance Directive Documentation        Most Recent Value   Type of Advance Directive  Healthcare Power of Attorney, Living will   Pre-existing out of facility DNR order (yellow form or pink MOST form)     "MOST" Form in Place?        TOTAL TIME TAKING CARE OF THIS PATIENT: 35 minutes.    Vaughan Basta M.D on 09/24/2014 at 2:24 PM  Between 7am to 6pm - Pager - [phone]  After 6pm go to [email or web address] - password [email or web address] [facility]  Office  [phone]  CC: Primary care physician; Enid Derry, MD

## 2014-09-28 LAB — CULTURE, BLOOD (ROUTINE X 2)
CULTURE: NO GROWTH
CULTURE: NO GROWTH

## 2014-09-29 ENCOUNTER — Telehealth: Payer: Self-pay

## 2014-09-29 MED ORDER — FLUCONAZOLE 100 MG PO TABS: 100.0000 mg | ORAL_TABLET | Freq: Every day | ORAL | Status: DC

## 2014-09-29 NOTE — Progress Notes (Signed)
  Oncology Nurse Navigator Documentation    Navigator Encounter Type: Telephone (09/29/14 1100) Patient Visit Type: Follow-up (09/29/14 1100) Treatment Phase: Other (09/29/14 1100)     Interventions: Other (Meds to manage side effects from chemo) (09/29/14 1100)            Time Spent with Patient: 15 (09/29/14 1100)

## 2014-09-29 NOTE — Telephone Encounter (Signed)
Patient called Robin Howard, breast navigator, requesting Diflucan for yeast infection caused by antibiotics she has been taking for colitis diagnosed in the hospital.    I have spoken with Dr. Grayland Ormond: v/o to send rx for diflucan to pharmacy.

## 2014-10-05 ENCOUNTER — Telehealth: Payer: Self-pay | Admitting: Pharmacist

## 2014-10-05 ENCOUNTER — Inpatient Hospital Stay (HOSPITAL_BASED_OUTPATIENT_CLINIC_OR_DEPARTMENT_OTHER): Payer: Medicaid Other | Admitting: Oncology

## 2014-10-05 ENCOUNTER — Inpatient Hospital Stay: Payer: Medicaid Other

## 2014-10-05 VITALS — BP 130/84 | HR 76 | Temp 97.0°F | Resp 18

## 2014-10-05 VITALS — BP 128/86 | HR 78 | Temp 97.7°F | Resp 16 | Wt 239.9 lb

## 2014-10-05 DIAGNOSIS — G4730 Sleep apnea, unspecified: Secondary | ICD-10-CM

## 2014-10-05 DIAGNOSIS — R0602 Shortness of breath: Secondary | ICD-10-CM

## 2014-10-05 DIAGNOSIS — E039 Hypothyroidism, unspecified: Secondary | ICD-10-CM

## 2014-10-05 DIAGNOSIS — Z418 Encounter for other procedures for purposes other than remedying health state: Secondary | ICD-10-CM

## 2014-10-05 DIAGNOSIS — C50911 Malignant neoplasm of unspecified site of right female breast: Secondary | ICD-10-CM

## 2014-10-05 DIAGNOSIS — F329 Major depressive disorder, single episode, unspecified: Secondary | ICD-10-CM

## 2014-10-05 DIAGNOSIS — R197 Diarrhea, unspecified: Secondary | ICD-10-CM

## 2014-10-05 DIAGNOSIS — Z8669 Personal history of other diseases of the nervous system and sense organs: Secondary | ICD-10-CM

## 2014-10-05 DIAGNOSIS — I7090 Unspecified atherosclerosis: Secondary | ICD-10-CM

## 2014-10-05 DIAGNOSIS — Z171 Estrogen receptor negative status [ER-]: Secondary | ICD-10-CM

## 2014-10-05 DIAGNOSIS — R109 Unspecified abdominal pain: Secondary | ICD-10-CM | POA: Diagnosis not present

## 2014-10-05 DIAGNOSIS — C50912 Malignant neoplasm of unspecified site of left female breast: Secondary | ICD-10-CM

## 2014-10-05 DIAGNOSIS — K5900 Constipation, unspecified: Secondary | ICD-10-CM

## 2014-10-05 DIAGNOSIS — R5383 Other fatigue: Secondary | ICD-10-CM

## 2014-10-05 DIAGNOSIS — E669 Obesity, unspecified: Secondary | ICD-10-CM

## 2014-10-05 DIAGNOSIS — K219 Gastro-esophageal reflux disease without esophagitis: Secondary | ICD-10-CM

## 2014-10-05 DIAGNOSIS — I10 Essential (primary) hypertension: Secondary | ICD-10-CM

## 2014-10-05 DIAGNOSIS — R531 Weakness: Secondary | ICD-10-CM

## 2014-10-05 DIAGNOSIS — Z5111 Encounter for antineoplastic chemotherapy: Secondary | ICD-10-CM | POA: Diagnosis not present

## 2014-10-05 DIAGNOSIS — K7689 Other specified diseases of liver: Secondary | ICD-10-CM | POA: Diagnosis not present

## 2014-10-05 DIAGNOSIS — I252 Old myocardial infarction: Secondary | ICD-10-CM

## 2014-10-05 DIAGNOSIS — Z809 Family history of malignant neoplasm, unspecified: Secondary | ICD-10-CM

## 2014-10-05 DIAGNOSIS — Z803 Family history of malignant neoplasm of breast: Secondary | ICD-10-CM

## 2014-10-05 DIAGNOSIS — R630 Anorexia: Secondary | ICD-10-CM

## 2014-10-05 DIAGNOSIS — F419 Anxiety disorder, unspecified: Secondary | ICD-10-CM

## 2014-10-05 DIAGNOSIS — I319 Disease of pericardium, unspecified: Secondary | ICD-10-CM

## 2014-10-05 LAB — CBC WITH DIFFERENTIAL/PLATELET
BASOS ABS: 0 10*3/uL (ref 0–0.1)
Basophils Relative: 0 %
Eosinophils Absolute: 1.1 10*3/uL — ABNORMAL HIGH (ref 0–0.7)
Eosinophils Relative: 16 %
HEMATOCRIT: 30.8 % — AB (ref 35.0–47.0)
Hemoglobin: 10.4 g/dL — ABNORMAL LOW (ref 12.0–16.0)
LYMPHS PCT: 16 %
Lymphs Abs: 1.1 10*3/uL (ref 1.0–3.6)
MCH: 28.1 pg (ref 26.0–34.0)
MCHC: 33.8 g/dL (ref 32.0–36.0)
MCV: 83.2 fL (ref 80.0–100.0)
Monocytes Absolute: 0.6 10*3/uL (ref 0.2–0.9)
Monocytes Relative: 9 %
NEUTROS ABS: 4 10*3/uL (ref 1.4–6.5)
Neutrophils Relative %: 59 %
Platelets: 302 10*3/uL (ref 150–440)
RBC: 3.7 MIL/uL — AB (ref 3.80–5.20)
RDW: 16.4 % — ABNORMAL HIGH (ref 11.5–14.5)
WBC: 6.8 10*3/uL (ref 3.6–11.0)

## 2014-10-05 LAB — COMPREHENSIVE METABOLIC PANEL
ALT: 29 U/L (ref 14–54)
AST: 41 U/L (ref 15–41)
Albumin: 3.4 g/dL — ABNORMAL LOW (ref 3.5–5.0)
Alkaline Phosphatase: 83 U/L (ref 38–126)
Anion gap: 6 (ref 5–15)
BILIRUBIN TOTAL: 0.6 mg/dL (ref 0.3–1.2)
BUN: 9 mg/dL (ref 6–20)
CHLORIDE: 105 mmol/L (ref 101–111)
CO2: 31 mmol/L (ref 22–32)
CREATININE: 0.82 mg/dL (ref 0.44–1.00)
Calcium: 8.7 mg/dL — ABNORMAL LOW (ref 8.9–10.3)
GFR calc Af Amer: >60 mL/min (ref >60)
Glucose, Bld: 103 mg/dL — ABNORMAL HIGH (ref 65–99)
Potassium: 3.2 mmol/L — ABNORMAL LOW (ref 3.5–5.1)
Sodium: 142 mmol/L (ref 135–145)
Total Protein: 5.9 g/dL — ABNORMAL LOW (ref 6.5–8.1)

## 2014-10-05 MED ORDER — DIPHENHYDRAMINE HCL 25 MG PO CAPS
50.0000 mg | ORAL_CAPSULE | Freq: Once | ORAL | Status: AC
  Administered 2014-10-05: 50 mg via ORAL
  Filled 2014-10-05: qty 2

## 2014-10-05 MED ORDER — PEGFILGRASTIM 6 MG/0.6ML ~~LOC~~ PSKT
6.0000 mg | PREFILLED_SYRINGE | Freq: Once | SUBCUTANEOUS | Status: AC
  Administered 2014-10-05: 6 mg via SUBCUTANEOUS
  Filled 2014-10-05: qty 0.6

## 2014-10-05 MED ORDER — SODIUM CHLORIDE 0.9 % IV SOLN
900.0000 mg | Freq: Once | INTRAVENOUS | Status: AC
  Administered 2014-10-05: 900 mg via INTRAVENOUS
  Filled 2014-10-05: qty 90

## 2014-10-05 MED ORDER — HEPARIN SOD (PORK) LOCK FLUSH 100 UNIT/ML IV SOLN
500.0000 [IU] | Freq: Once | INTRAVENOUS | Status: AC
  Administered 2014-10-05: 500 [IU] via INTRAVENOUS
  Filled 2014-10-05: qty 5

## 2014-10-05 MED ORDER — SODIUM CHLORIDE 0.9 % IV SOLN
Freq: Once | INTRAVENOUS | Status: AC
  Administered 2014-10-05: 11:00:00 via INTRAVENOUS
  Filled 2014-10-05: qty 1000

## 2014-10-05 MED ORDER — SODIUM CHLORIDE 0.9 % IV SOLN
Freq: Once | INTRAVENOUS | Status: AC
  Administered 2014-10-05: 11:00:00 via INTRAVENOUS
  Filled 2014-10-05: qty 8

## 2014-10-05 MED ORDER — ACETAMINOPHEN 325 MG PO TABS
650.0000 mg | ORAL_TABLET | Freq: Once | ORAL | Status: AC
  Administered 2014-10-05: 650 mg via ORAL
  Filled 2014-10-05: qty 2

## 2014-10-05 MED ORDER — TRASTUZUMAB CHEMO INJECTION 440 MG
6.0000 mg/kg | Freq: Once | INTRAVENOUS | Status: AC
  Administered 2014-10-05: 651 mg via INTRAVENOUS
  Filled 2014-10-05: qty 31

## 2014-10-05 MED ORDER — DEXTROSE 5 % IV SOLN
67.5000 mg/m2 | Freq: Once | INTRAVENOUS | Status: AC
  Administered 2014-10-05: 150 mg via INTRAVENOUS
  Filled 2014-10-05: qty 15

## 2014-10-05 MED ORDER — SODIUM CHLORIDE 0.9 % IJ SOLN
10.0000 mL | INTRAMUSCULAR | Status: DC | PRN
  Administered 2014-10-05: 10 mL via INTRAVENOUS
  Filled 2014-10-05: qty 10

## 2014-10-05 NOTE — Progress Notes (Signed)
Diarrhea has improved does have loose stools, with no cramping, and yesterday she only had 2 loose stools.  He main concern today is the headache that started 3 days ago with the pain being 4/10 on pain scale but was a 8/10 yesterday.

## 2014-10-05 NOTE — Telephone Encounter (Signed)
Spoke with Dr Grayland Ormond. He would like 900mg  Carboplatin to be continued. This dose is correct based on wt and CrCl.

## 2014-10-06 ENCOUNTER — Telehealth: Payer: Self-pay

## 2014-10-06 NOTE — Progress Notes (Signed)
Notified patient of normal  pap smear results. Copy to [facility].

## 2014-10-07 ENCOUNTER — Ambulatory Visit: Payer: Medicaid Other | Admitting: Radiation Oncology

## 2014-10-12 ENCOUNTER — Inpatient Hospital Stay: Payer: Medicaid Other

## 2014-10-12 ENCOUNTER — Telehealth: Payer: Self-pay | Admitting: *Deleted

## 2014-10-12 ENCOUNTER — Other Ambulatory Visit: Payer: Self-pay | Admitting: Oncology

## 2014-10-12 VITALS — BP 128/77 | HR 89 | Temp 97.2°F | Resp 18

## 2014-10-12 DIAGNOSIS — C50912 Malignant neoplasm of unspecified site of left female breast: Secondary | ICD-10-CM

## 2014-10-12 DIAGNOSIS — Z95828 Presence of other vascular implants and grafts: Secondary | ICD-10-CM

## 2014-10-12 DIAGNOSIS — C50919 Malignant neoplasm of unspecified site of unspecified female breast: Secondary | ICD-10-CM

## 2014-10-12 DIAGNOSIS — Z5111 Encounter for antineoplastic chemotherapy: Secondary | ICD-10-CM | POA: Diagnosis not present

## 2014-10-12 DIAGNOSIS — E860 Dehydration: Secondary | ICD-10-CM

## 2014-10-12 MED ORDER — SODIUM CHLORIDE 0.9 % IV SOLN
Freq: Once | INTRAVENOUS | Status: AC
  Administered 2014-10-12: 10:00:00 via INTRAVENOUS
  Filled 2014-10-12: qty 1000

## 2014-10-12 MED ORDER — HEPARIN SOD (PORK) LOCK FLUSH 100 UNIT/ML IV SOLN
500.0000 [IU] | Freq: Once | INTRAVENOUS | Status: AC
  Administered 2014-10-12: 500 [IU] via INTRAVENOUS
  Filled 2014-10-12: qty 5

## 2014-10-12 MED ORDER — KETOROLAC TROMETHAMINE 30 MG/ML IJ SOLN
30.0000 mg | Freq: Once | INTRAMUSCULAR | Status: AC
  Administered 2014-10-12: 30 mg via INTRAVENOUS
  Filled 2014-10-12: qty 1

## 2014-10-12 MED ORDER — ONDANSETRON HCL 4 MG/2ML IJ SOLN
INTRAMUSCULAR | Status: AC
  Filled 2014-10-12: qty 4

## 2014-10-12 MED ORDER — DEXAMETHASONE SODIUM PHOSPHATE 10 MG/ML IJ SOLN
INTRAMUSCULAR | Status: AC
  Filled 2014-10-12: qty 1

## 2014-10-12 MED ORDER — SODIUM CHLORIDE 0.9 % IV SOLN
Freq: Once | INTRAVENOUS | Status: AC
  Administered 2014-10-12: 10:00:00 via INTRAVENOUS
  Filled 2014-10-12: qty 4

## 2014-10-12 MED ORDER — SODIUM CHLORIDE 0.9 % IV SOLN
Freq: Once | INTRAVENOUS | Status: AC
  Administered 2014-10-12: 11:00:00 via INTRAVENOUS
  Filled 2014-10-12: qty 1000

## 2014-10-12 MED ORDER — SODIUM CHLORIDE 0.9 % IJ SOLN
10.0000 mL | INTRAMUSCULAR | Status: DC | PRN
  Administered 2014-10-12: 10 mL via INTRAVENOUS
  Filled 2014-10-12: qty 10

## 2014-10-12 NOTE — Progress Notes (Signed)
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  Chief Complaint  Patient presents with  . Breast Cancer    INTERVAL HISTORY:  Patient returns to clinic today for further evaluation and consideration of cycle 2 of 6 of Taxotere, carboplatinum, and Herceptin. She had increased nausea and diarrhea after her first infusion, but this is now resolved. She has no neurologic complaints. She denies any recent fevers or illnesses. She has a good appetite and denies weight loss. She has no chest pain or shortness of breath. She has no urinary complaints. Patient offers no further specific complaints.  REVIEW OF SYSTEMS:   Review of Systems  Constitutional: Negative.   Respiratory: Negative.   Cardiovascular: Negative.   Gastrointestinal: Positive for nausea and diarrhea. Negative for abdominal pain.  Neurological: Negative.   Psychiatric/Behavioral: The patient is nervous/anxious.     As per HPI. Otherwise, a complete review of systems is negatve.  PAST MEDICAL HISTORY: Past Medical History  Diagnosis Date  . Major depressive disorder, recurrent   . HTN (hypertension)   . Overactive bladder   . Migraines   . Gastritis   . Hypothyroidism   . OSA (obstructive sleep apnea)     untreated due to lack of insurance  . Insomnia   . GERD (gastroesophageal reflux disease)   . Pericarditis 1997  . Detached retina 1997  . Chronic headache   . Morbid obesity   . NSTEMI (non-ST elevated myocardial infarction) 04/16/14    Due to demand- normal Cath 04/19/14  . Breast cancer   . Bronchitis 04-2014  . Shortness of breath dyspnea    with exertion only  . Anemia     h/o  . Pericarditis 1997  . Family history of adverse reaction to anesthesia     pts dad has pseudocholinestrace deficieny    PAST SURGICAL HISTORY: Past Surgical History  Procedure Laterality Date  . Back surgery      L4-5  . Knee surgery    . Cesarean section    . Coronary angioplasty  April 2016  . Retinal detachment surgery Right 1997  . Foot surgery    . Breast lumpectomy with sentinel lymph node biopsy Left 08/13/2014    Procedure: BREAST LUMPECTOMY WITH SENTINEL LYMPH NODE BIOPSY;  Surgeon: Robert Bellow, MD;  Location: [facility] ORS;  Service: General;  Laterality: Left;  . Breast mammosite Left 08/30/2014    Procedure: MAMMOSITE BREAST;  Surgeon: Robert Bellow, MD;  Location: [facility] ORS;  Service: General;  Laterality: Left;  . Portacath placement Right 08/30/2014    Procedure: INSERTION PORT-A-CATH;  Surgeon: Robert Bellow, MD;  Location: [facility] ORS;  Service: General;  Laterality: Right;    FAMILY HISTORY Family History  Problem Relation Age of Onset  . Alcohol abuse Mother   . Thyroid disease Mother   . Hypertension Mother   . COPD Mother   . Cancer Father     liposarcoma  . Congestive Heart Failure Father   . Depression Father   . Hypertension Father   . Cancer Maternal Uncle     Pancreatic cancer  . Heart disease Paternal Grandfather 66  MI  . Diabetes Other   . Cancer Sister     breast  . Breast cancer Sister 57       ADVANCED DIRECTIVES:    HEALTH MAINTENANCE: Social History  Substance Use Topics  . Smoking status: Never Smoker   . Smokeless tobacco: Never Used  . Alcohol Use: No     Colonoscopy:  PAP:  Bone density:  Lipid panel:  Allergies  Allergen Reactions  . Reglan [Metoclopramide] Shortness Of Breath and Other (See Comments)    Dystonic Reaction  . Paxil [Paroxetine Hcl] Other (See Comments)    Blurred vision  . Tegaderm Ag Mesh [Silver] Rash  . Ultram [Tramadol] Itching and Rash     Current Outpatient Prescriptions  Medication Sig Dispense Refill  . aspirin EC 81 MG tablet Take 81 mg by mouth daily.    . cholecalciferol (VITAMIN D) 1000 UNITS tablet Take 1,000 Units by mouth daily.    . diphenhydrAMINE (BENADRYL) 25 MG tablet Take 25 mg by mouth every 6 (six) hours as needed for allergies.     . diphenoxylate-atropine (LOMOTIL) 2.5-0.025 MG per tablet Take 2 tablets by mouth 4 (four) times daily as needed for diarrhea or loose stools. 60 tablet 2  . escitalopram (LEXAPRO) 20 MG tablet Take 1 tablet (20 mg total) by mouth daily. Stop the escitalopram (Patient taking differently: Take 20 mg by mouth every morning. Stop the escitalopram) 30 tablet 2  . HYDROcodone-acetaminophen (NORCO) 5-325 MG per tablet Take 1 tablet by mouth every 4 (four) hours as needed for moderate pain. 30 tablet 0  . levothyroxine (SYNTHROID, LEVOTHROID) 100 MCG tablet Take 1 tablet (100 mcg total) by mouth daily. 30 tablet 11  . lidocaine-prilocaine (EMLA) cream Apply 1 application topically once. Apply cream to port 1-2 hours prior to chemotherapy appointment, cover with plastic wrap. 30 g 0  . Multiple Vitamin (MULTIVITAMIN) capsule Take 1 capsule by mouth daily.    Marland Kitchen omeprazole (PRILOSEC) 40 MG capsule Take 1 capsule (40 mg total) by mouth daily. (Patient taking differently: Take 40 mg by mouth every morning. ) 30 capsule 3  . ondansetron (ZOFRAN ODT) 4 MG disintegrating tablet Take 1 tablet (4 mg total) by mouth every 8 (eight) hours as needed for nausea or vomiting. 20 tablet 0  . prochlorperazine (COMPAZINE) 10 MG tablet Take 1 tablet (10 mg total) by mouth every 6 (six) hours as needed for nausea or vomiting. 30 tablet 1  . vitamin B-12 (CYANOCOBALAMIN) 1000 MCG tablet Take 1,000 mcg by mouth daily.     No current facility-administered medications for this visit.    OBJECTIVE: Filed Vitals:   10/05/14 0954  BP: 128/86  Pulse: 78  Temp: 97.7 F (36.5 C)  Resp: 16     Body mass index  is 38.73 kg/(m^2).    ECOG FS:0 - Asymptomatic  General: Well-developed, well-nourished, no acute distress. Eyes: Pink conjunctiva, anicteric sclera. Breasts: Exam deferred today. Lungs: Clear to auscultation bilaterally. Heart: Regular rate and rhythm. No rubs, murmurs, or gallops. Abdomen: Soft, nontender, nondistended. No organomegaly noted, normoactive bowel sounds. Musculoskeletal: No edema, cyanosis, or clubbing. Neuro: Alert, answering all questions appropriately. Cranial nerves grossly intact. Skin: No rashes or petechiae noted. Psych: Normal affect.   LAB RESULTS:  Lab Results  Component Value Date   NA 142 10/05/2014   K 3.2* 10/05/2014   CL 105 10/05/2014   CO2 31 10/05/2014   GLUCOSE 103* 10/05/2014   BUN 9 10/05/2014   CREATININE 0.82  10/05/2014   CALCIUM 8.7* 10/05/2014   PROT 5.9* 10/05/2014   ALBUMIN 3.4* 10/05/2014   AST 41 10/05/2014   ALT 29 10/05/2014   ALKPHOS 83 10/05/2014   BILITOT 0.6 10/05/2014   GFRNONAA >60 10/05/2014   GFRAA >60 10/05/2014    Lab Results  Component Value Date   WBC 6.8 10/05/2014   NEUTROABS 4.0 10/05/2014   HGB 10.4* 10/05/2014   HCT 30.8* 10/05/2014   MCV 83.2 10/05/2014   PLT 302 10/05/2014     STUDIES: Ct Abdomen Pelvis W Contrast  09/23/2014   CLINICAL DATA:  Had chemotherapy last Tuesday. Abdominal pain last Thursday. Diarrhea.  EXAM: CT ABDOMEN AND PELVIS WITH CONTRAST  TECHNIQUE: Multidetector CT imaging of the abdomen and pelvis was performed using the standard protocol following bolus administration of intravenous contrast.  CONTRAST:  155m OMNIPAQUE IOHEXOL 350 MG/ML SOLN  COMPARISON:  None.  FINDINGS: Lower chest:  Lung bases are clear.  Normal heart size.  Hepatobiliary: 5 mm hypodensity in the right hepatic lobe near the dome of the liver too small to characterize, likely representing a small cyst. No other focal hepatic mass. Normal gallbladder. No intrahepatic or extrahepatic biliary ductal dilatation.   Pancreas: Normal.  Spleen: Normal.  Adrenals/Urinary Tract: Normal adrenal glands. Normal kidneys. No urolithiasis or obstructive uropathy. Normal bladder.  Stomach/Bowel: No bowel dilatation to suggest obstruction. Normal appendix. Mild bowel wall thickening involving the distal descending colon and sigmoid colon concerning for mild colitis. No pneumatosis, pneumoperitoneum or portal venous gas. No focal fluid collection to suggest an abscess.  Vascular/Lymphatic: Normal caliber abdominal aorta with mild atherosclerosis. No abdominal or pelvic lymphadenopathy.  Reproductive: Normal uterus.  No adnexal mass.  Other: No fluid collection or hematoma.  Musculoskeletal: No lytic or sclerotic osseous lesion. Mild degenerative changes of the right SI joint. Abnormal articulation of the right L5 transverse process with the sacrum. No acute osseous abnormality. Bilateral facet arthropathy at L4-5 and L5-S1.  IMPRESSION: 1. Mild colitis of the distal descending and sigmoid colon likely secondary to an infectious or inflammatory etiology.   Electronically Signed   By: HKathreen Devoid  On: 09/23/2014 15:34   Dg Abd 2 Views  09/21/2014   CLINICAL DATA:  Abdominal pain and diarrhea for 3 days, currently receiving chemotherapy and recently finished radiation therapy for breast malignancy  EXAM: ABDOMEN - 2 VIEW  COMPARISON:  None.  FINDINGS: There are a few loops of minimally distended gas-filled small bowel in the mid abdomen. There is some gas and stool within colon. There is no gas in the rectum. There are no free extraluminal gas collections. There are phleboliths in the pelvis. The bony structures exhibit no acute abnormalities. The lung bases are clear.  IMPRESSION: Nonspecific bowel gas pattern. An early ileus may be present. There is no evidence of obstruction or perforation.   Electronically Signed   By: David  JMartiniqueM.D.   On: 09/21/2014 15:44    ASSESSMENT:  Stage Ia HER-2 positive adenocarcinoma of the left  breast, BCRA 1&2  negative.  PLAN:    1. Breast cancer: Given the fact that patient is HER-2 positive, she will benefit from adjuvant chemotherapy. Patient has now completed MammoSite radiation. Pretreatment MUGA reported an ejection fraction of 52% Proceed with cycle 2 of 6 of Taxotere, carboplatinum, and Herceptin. Patient also will require a total of 18 treatments of Herceptin every 3 weeks for a total of one year. Return to clinic tomorrow for Neulasta, in 1 week  for laboratory work and further evaluation, and then in 4 weeks for consideration of cycle 3. She does not require an aromatase inhibitor given the fact that her tumor is ER/PR negative.   Patient expressed understanding and was in agreement with this plan. She also understands that She can call clinic at any time with any questions, concerns, or complaints.   Breast cancer, female   Staging form: Breast, AJCC 7th Edition     Pathologic stage from 08/17/2014: Stage IA (T1c, N0, cM0) - Signed by Lloyd Huger, MD on 08/17/2014   Lloyd Huger, MD   10/12/2014 9:36 AM

## 2014-10-12 NOTE — Progress Notes (Signed)
Add on visit today for dehydration. Complains of nausea/vomiting and diarrhea. Had 6 stools overnight. 2-3 episodes of vomiting. States her head aches . Rates it a 6/10 on pain scale. Called MD for pain med. He ordered Toradol 30mg  IVP x 1. Patient also received Decadron and zofran IV. She reports her head pain has decreased to a 4/10. Receiving hydration as well. Pt is pale, acutely ill appearing. Trying to sleep in recliner. At completetion of 2nd liter of NS pt was tearful stating she doesn't feel much better.She is tearful. Called Dr Grayland Ormond he requested I call in Cipro 250mg  twice a day for 7 days which I did call into [facility] on [address]. Pt will come back tomm am for more fluids. Pt rested in infusion for 2 hours after IV's discontinued. Port needle flushed and left in. Pt left infusion at 2:30PM. Driving herself home. Pt states she feels safe to drive.

## 2014-10-12 NOTE — Telephone Encounter (Signed)
Thinks she is dehydrated and asking to be seen. Has h/a, nausea, diarrhea, dizziness. Per Dr Grayland Ormond patient can come in for IVF and med. I returned call to patient and offered her appt to go to [facility] office for IVF and meds she was given directions on how to get there and said she would be there in 30 mins

## 2014-10-13 ENCOUNTER — Other Ambulatory Visit: Payer: Self-pay | Admitting: Family Medicine

## 2014-10-13 ENCOUNTER — Encounter: Payer: Self-pay | Admitting: *Deleted

## 2014-10-13 ENCOUNTER — Inpatient Hospital Stay: Payer: Medicaid Other

## 2014-10-13 VITALS — BP 159/88 | HR 90 | Temp 97.3°F | Resp 18

## 2014-10-13 DIAGNOSIS — Z5111 Encounter for antineoplastic chemotherapy: Secondary | ICD-10-CM | POA: Diagnosis not present

## 2014-10-13 DIAGNOSIS — E860 Dehydration: Secondary | ICD-10-CM

## 2014-10-13 MED ORDER — OCTREOTIDE ACETATE 100 MCG/ML IJ SOLN
100.0000 ug | Freq: Once | INTRAMUSCULAR | Status: AC
  Administered 2014-10-13: 100 ug via SUBCUTANEOUS
  Filled 2014-10-13: qty 1

## 2014-10-13 MED ORDER — SODIUM CHLORIDE 0.9 % IV SOLN
Freq: Once | INTRAVENOUS | Status: AC
  Administered 2014-10-13: 11:00:00 via INTRAVENOUS
  Filled 2014-10-13: qty 1000

## 2014-10-13 MED ORDER — HEPARIN SOD (PORK) LOCK FLUSH 100 UNIT/ML IV SOLN
500.0000 [IU] | Freq: Once | INTRAVENOUS | Status: AC
  Administered 2014-10-13: 500 [IU] via INTRAVENOUS

## 2014-10-13 MED ORDER — OCTREOTIDE ACETATE 20 MG IM KIT
20.0000 mg | PACK | Freq: Once | INTRAMUSCULAR | Status: AC
  Administered 2014-10-13: 20 mg via INTRAMUSCULAR
  Filled 2014-10-13: qty 1

## 2014-10-13 MED ORDER — SODIUM CHLORIDE 0.9 % IJ SOLN
10.0000 mL | INTRAMUSCULAR | Status: DC | PRN
  Administered 2014-10-13: 10 mL via INTRAVENOUS
  Filled 2014-10-13: qty 10

## 2014-10-13 MED ORDER — SODIUM CHLORIDE 0.9 % IV SOLN
Freq: Once | INTRAVENOUS | Status: AC
  Administered 2014-10-13: 10:00:00 via INTRAVENOUS
  Filled 2014-10-13: qty 1000

## 2014-10-13 NOTE — Telephone Encounter (Signed)
We talked on the phone last month; going through cancer treatment; continue med

## 2014-10-13 NOTE — Progress Notes (Signed)
Received phone call from [place], RN in [place] regarding patient having continued diarrhea. Per verbal order from Dr. Grayland Ormond patient may receive Sandostatin 20 IM and Sandostatin 100 SQ. I spoke with [facility] in the pharmacy, pharmacist to enter orders. Kristin in [place] notified of plan.

## 2014-10-13 NOTE — Telephone Encounter (Signed)
Opened in error

## 2014-10-14 MED ORDER — RANITIDINE HCL 300 MG PO TABS: 300.0000 mg | ORAL_TABLET | Freq: Every day | ORAL | Status: DC

## 2014-10-14 NOTE — Telephone Encounter (Signed)
Patient notified, she states she is having a lot of issues with heart burn while on chemo treatments. I advised her to try and see and if it doesn't help, to let us know and we can try something else.

## 2014-10-14 NOTE — Telephone Encounter (Signed)
Your patient 

## 2014-10-14 NOTE — Telephone Encounter (Signed)
Let the patient know that I'd like to change her omeprazole to ranitidine On the higher dose of lexapro and some other medicine she has now, I'd like to make that switch Avoid trigger foods and drinks (caffeine, chocolate, spicy foods, tomato-based foods, onions, etc.)

## 2014-10-21 ENCOUNTER — Encounter: Payer: Self-pay | Admitting: *Deleted

## 2014-10-21 NOTE — Progress Notes (Signed)
  Oncology Nurse Navigator Documentation    Navigator Encounter Type: Telephone (10/21/14 1500) Patient Visit Type: Follow-up (10/21/14 1500) Treatment Phase: Other (10/21/14 1500) Barriers/Navigation Needs: Financial (10/21/14 1500)   Interventions: Other ([employer] fund) (10/21/14 1500)            Time Spent with Patient: 15 (10/21/14 1500)     Talked to patient today.  She needs assistance with her bills.  Left bills for [place] to have [employer] pay for her.  States she has an appointment with Barnabas Lister the social worker on the 18th.  To call if she has any questions or needs.

## 2014-10-26 NOTE — Progress Notes (Signed)
  Oncology Nurse Navigator Documentation    Navigator Encounter Type: Telephone (10/26/14 1600) Patient Visit Type: Follow-up (10/26/14 1600)       Interventions: None required (10/26/14 1600)            Time Spent with Patient: 15 (10/26/14 1600)   Phoned patient .  States she is feeling better this week, and going to son's wedding this weekend.  Reminded to call if she has any needs or concerns.

## 2014-11-02 ENCOUNTER — Inpatient Hospital Stay: Payer: Medicaid Other | Attending: Oncology | Admitting: Oncology

## 2014-11-02 ENCOUNTER — Inpatient Hospital Stay: Payer: Medicaid Other

## 2014-11-02 ENCOUNTER — Ambulatory Visit
Admission: RE | Admit: 2014-11-02 | Discharge: 2014-11-02 | Disposition: A | Discharge status: Home / Self Care | Payer: Medicaid Other | Source: Ambulatory Visit | Attending: Radiation Oncology | Admitting: Radiation Oncology

## 2014-11-02 ENCOUNTER — Encounter: Payer: Self-pay | Admitting: Radiation Oncology

## 2014-11-02 VITALS — BP 141/94 | HR 78 | Temp 98.6°F | Ht 66.0 in | Wt 237.5 lb

## 2014-11-02 DIAGNOSIS — Z8669 Personal history of other diseases of the nervous system and sense organs: Secondary | ICD-10-CM | POA: Insufficient documentation

## 2014-11-02 DIAGNOSIS — Z8719 Personal history of other diseases of the digestive system: Secondary | ICD-10-CM | POA: Diagnosis not present

## 2014-11-02 DIAGNOSIS — Z171 Estrogen receptor negative status [ER-]: Secondary | ICD-10-CM | POA: Insufficient documentation

## 2014-11-02 DIAGNOSIS — I319 Disease of pericardium, unspecified: Secondary | ICD-10-CM | POA: Diagnosis not present

## 2014-11-02 DIAGNOSIS — R197 Diarrhea, unspecified: Secondary | ICD-10-CM | POA: Diagnosis not present

## 2014-11-02 DIAGNOSIS — G4730 Sleep apnea, unspecified: Secondary | ICD-10-CM | POA: Diagnosis not present

## 2014-11-02 DIAGNOSIS — E039 Hypothyroidism, unspecified: Secondary | ICD-10-CM | POA: Insufficient documentation

## 2014-11-02 DIAGNOSIS — Z809 Family history of malignant neoplasm, unspecified: Secondary | ICD-10-CM

## 2014-11-02 DIAGNOSIS — E669 Obesity, unspecified: Secondary | ICD-10-CM | POA: Diagnosis not present

## 2014-11-02 DIAGNOSIS — R110 Nausea: Secondary | ICD-10-CM | POA: Diagnosis not present

## 2014-11-02 DIAGNOSIS — G4700 Insomnia, unspecified: Secondary | ICD-10-CM | POA: Insufficient documentation

## 2014-11-02 DIAGNOSIS — Z803 Family history of malignant neoplasm of breast: Secondary | ICD-10-CM | POA: Diagnosis not present

## 2014-11-02 DIAGNOSIS — I10 Essential (primary) hypertension: Secondary | ICD-10-CM | POA: Diagnosis not present

## 2014-11-02 DIAGNOSIS — Z7982 Long term (current) use of aspirin: Secondary | ICD-10-CM | POA: Diagnosis not present

## 2014-11-02 DIAGNOSIS — F329 Major depressive disorder, single episode, unspecified: Secondary | ICD-10-CM | POA: Insufficient documentation

## 2014-11-02 DIAGNOSIS — Z79899 Other long term (current) drug therapy: Secondary | ICD-10-CM | POA: Insufficient documentation

## 2014-11-02 DIAGNOSIS — Z5111 Encounter for antineoplastic chemotherapy: Secondary | ICD-10-CM | POA: Insufficient documentation

## 2014-11-02 DIAGNOSIS — D649 Anemia, unspecified: Secondary | ICD-10-CM | POA: Insufficient documentation

## 2014-11-02 DIAGNOSIS — N3281 Overactive bladder: Secondary | ICD-10-CM | POA: Insufficient documentation

## 2014-11-02 DIAGNOSIS — I252 Old myocardial infarction: Secondary | ICD-10-CM | POA: Insufficient documentation

## 2014-11-02 DIAGNOSIS — R0609 Other forms of dyspnea: Secondary | ICD-10-CM | POA: Diagnosis not present

## 2014-11-02 DIAGNOSIS — C50912 Malignant neoplasm of unspecified site of left female breast: Secondary | ICD-10-CM

## 2014-11-02 DIAGNOSIS — K219 Gastro-esophageal reflux disease without esophagitis: Secondary | ICD-10-CM | POA: Diagnosis not present

## 2014-11-02 DIAGNOSIS — C50512 Malignant neoplasm of lower-outer quadrant of left female breast: Secondary | ICD-10-CM

## 2014-11-02 DIAGNOSIS — Z5112 Encounter for antineoplastic immunotherapy: Secondary | ICD-10-CM | POA: Diagnosis not present

## 2014-11-02 LAB — COMPREHENSIVE METABOLIC PANEL
ALBUMIN: 3.6 g/dL (ref 3.5–5.0)
ALK PHOS: 101 U/L (ref 38–126)
ALT: 23 U/L (ref 14–54)
ANION GAP: 5 (ref 5–15)
AST: 38 U/L (ref 15–41)
BILIRUBIN TOTAL: 1.1 mg/dL (ref 0.3–1.2)
BUN: 13 mg/dL (ref 6–20)
CALCIUM: 8.5 mg/dL — AB (ref 8.9–10.3)
CO2: 30 mmol/L (ref 22–32)
CREATININE: 1.12 mg/dL — AB (ref 0.44–1.00)
Chloride: 103 mmol/L (ref 101–111)
GFR calc Af Amer: >60 mL/min (ref >60)
GFR calc non Af Amer: 55 mL/min — ABNORMAL LOW (ref >60)
GLUCOSE: 120 mg/dL — AB (ref 65–99)
Potassium: 2.7 mmol/L — CL (ref 3.5–5.1)
SODIUM: 138 mmol/L (ref 135–145)
TOTAL PROTEIN: 6.1 g/dL — AB (ref 6.5–8.1)

## 2014-11-02 LAB — CBC WITH DIFFERENTIAL/PLATELET
BASOS PCT: 1 %
Basophils Absolute: 0.1 10*3/uL (ref 0–0.1)
Eosinophils Absolute: 1.8 10*3/uL — ABNORMAL HIGH (ref 0–0.7)
Eosinophils Relative: 23 %
HEMATOCRIT: 28.5 % — AB (ref 35.0–47.0)
HEMOGLOBIN: 9.6 g/dL — AB (ref 12.0–16.0)
Lymphocytes Relative: 27 %
Lymphs Abs: 2 10*3/uL (ref 1.0–3.6)
MCH: 29 pg (ref 26.0–34.0)
MCHC: 33.7 g/dL (ref 32.0–36.0)
MCV: 85.9 fL (ref 80.0–100.0)
MONO ABS: 0.8 10*3/uL (ref 0.2–0.9)
MONOS PCT: 11 %
Neutro Abs: 3 10*3/uL (ref 1.4–6.5)
Neutrophils Relative %: 38 %
Platelets: 332 10*3/uL (ref 150–440)
RBC: 3.32 MIL/uL — AB (ref 3.80–5.20)
RDW: 20.1 % — ABNORMAL HIGH (ref 11.5–14.5)
WBC: 7.7 10*3/uL (ref 3.6–11.0)

## 2014-11-02 MED ORDER — SODIUM CHLORIDE 0.9 % IV SOLN
Freq: Once | INTRAVENOUS | Status: AC
  Administered 2014-11-02: 13:00:00 via INTRAVENOUS
  Filled 2014-11-02: qty 8

## 2014-11-02 MED ORDER — POTASSIUM CHLORIDE 10 MEQ/100ML IV SOLN
10.0000 meq | INTRAVENOUS | Status: DC
Start: 2014-11-02 — End: 2014-11-02

## 2014-11-02 MED ORDER — SODIUM CHLORIDE 0.9 % IV SOLN
900.0000 mg | Freq: Once | INTRAVENOUS | Status: AC
  Administered 2014-11-02: 900 mg via INTRAVENOUS
  Filled 2014-11-02: qty 90

## 2014-11-02 MED ORDER — ACETAMINOPHEN 325 MG PO TABS
650.0000 mg | ORAL_TABLET | Freq: Once | ORAL | Status: AC
  Administered 2014-11-02: 650 mg via ORAL
  Filled 2014-11-02: qty 2

## 2014-11-02 MED ORDER — TRASTUZUMAB CHEMO INJECTION 440 MG
6.0000 mg/kg | Freq: Once | INTRAVENOUS | Status: AC
  Administered 2014-11-02: 651 mg via INTRAVENOUS
  Filled 2014-11-02: qty 31

## 2014-11-02 MED ORDER — POTASSIUM CHLORIDE CRYS ER 20 MEQ PO TBCR: 20.0000 meq | EXTENDED_RELEASE_TABLET | Freq: Two times a day (BID) | ORAL | Status: DC

## 2014-11-02 MED ORDER — SODIUM CHLORIDE 0.9 % IV SOLN
Freq: Once | INTRAVENOUS | Status: AC
  Administered 2014-11-02: 11:00:00 via INTRAVENOUS
  Filled 2014-11-02: qty 1000

## 2014-11-02 MED ORDER — HEPARIN SOD (PORK) LOCK FLUSH 100 UNIT/ML IV SOLN
500.0000 [IU] | Freq: Once | INTRAVENOUS | Status: AC | PRN
  Administered 2014-11-02: 500 [IU]
  Filled 2014-11-02: qty 5

## 2014-11-02 MED ORDER — DIPHENHYDRAMINE HCL 25 MG PO CAPS
50.0000 mg | ORAL_CAPSULE | Freq: Once | ORAL | Status: AC
  Administered 2014-11-02: 50 mg via ORAL
  Filled 2014-11-02: qty 2

## 2014-11-02 MED ORDER — SODIUM CHLORIDE 0.9 % IV SOLN
Freq: Once | INTRAVENOUS | Status: AC
  Administered 2014-11-02: 11:00:00 via INTRAVENOUS
  Filled 2014-11-02: qty 250

## 2014-11-02 MED ORDER — SODIUM CHLORIDE 0.9 % IJ SOLN
10.0000 mL | INTRAMUSCULAR | Status: DC | PRN
  Administered 2014-11-02: 10 mL
  Filled 2014-11-02: qty 10

## 2014-11-02 NOTE — Progress Notes (Signed)
Radiation Oncology Follow up Note  Name: Robin Howard   Date:   11/02/2014 MRN:  [id number] DOB: 06/11/1960    This 54 y.o. female presents to the clinic today for follow-up for breast cancer status post accelerated partial breast irradiation for a stage I ER/PR negative HER-2/neu overexpressed invasive mammary carcinoma.  REFERRING PROVIDER: Lada, Melinda P, MD  HPI: Patient is a 54-year-old female now out 1 month having completed accelerated partial breast irradiation to her left breast 3 T1 CN 0 M0 ER/PR negative HER-2/neu overexpressed invasive mammary carcinoma status post wide local excision and sentinel node biopsy. Seen today in routine follow-up she is doing fair she's currently undergoing systemic chemotherapy with Herceptin. She states she notices little thickness around the 2:00 position of her left breast..  COMPLICATIONS OF TREATMENT: none  FOLLOW UP COMPLIANCE: keeps appointments   PHYSICAL EXAM:  There were no vitals taken for this visit. Lungs are clear to A&P cardiac examination essentially unremarkable with regular rate and rhythm. No dominant mass or nodularity is noted in either breast in 2 positions examined. Incision is well-healed. No axillary or supraclavicular adenopathy is appreciated. Cosmetic result is excellent.. Patient has a port placed in right anterior chest wall. There is some thickening in the lumpectomy cavity which we would expect from lumpectomy and high-dose radiation. It is well-circumscribed. Well-developed well-nourished patient in NAD. HEENT reveals PERLA, EOMI, discs not visualized.  Oral cavity is clear. No oral mucosal lesions are identified. Neck is clear without evidence of cervical or supraclavicular adenopathy. Lungs are clear to A&P. Cardiac examination is essentially unremarkable with regular rate and rhythm without murmur rub or thrill. Abdomen is benign with no organomegaly or masses noted. Motor sensory and DTR levels are equal and symmetric  in the upper and lower extremities. Cranial nerves II through XII are grossly intact. Proprioception is intact. No peripheral adenopathy or edema is identified. No motor or sensory levels are noted. Crude visual fields are within normal range.  RADIOLOGY RESULTS: No current films for review  PLAN: At the present time she continues on systemic chemotherapy and Herceptin. I'm please were overall progress. I have asked to see her back in 4-5 months for follow-up. She knows to call sooner with any concerns.  I would like to take this opportunity for allowing me to participate in the care of your patient..    CHRYSTAL,GLENN S., MD   

## 2014-11-02 NOTE — Progress Notes (Signed)
Per MD, Dr. Grayland Ormond, order: do not give Neulasta on body injector today.

## 2014-11-02 NOTE — Progress Notes (Addendum)
Hypokalemia: patient's potassium level 2.7 today. She received 40 mEq IV potassium abdomen was given a prescription for oral supplementation.

## 2014-11-03 NOTE — Progress Notes (Unsigned)
PSN met with patient today.  Patient tearful and appeared sad about her diagnosis and the financial concerns she is having.  PSN provided support and encouragement to patient, and allowed her time to share her feelings and concerns.  PSN will assist patient with various financial needs through the charitable funds.

## 2014-11-08 ENCOUNTER — Inpatient Hospital Stay: Payer: Medicaid Other

## 2014-11-08 ENCOUNTER — Telehealth: Payer: Self-pay | Admitting: *Deleted

## 2014-11-08 VITALS — BP 122/92 | HR 97 | Temp 96.8°F | Resp 20

## 2014-11-08 DIAGNOSIS — Z5189 Encounter for other specified aftercare: Secondary | ICD-10-CM

## 2014-11-08 DIAGNOSIS — C50912 Malignant neoplasm of unspecified site of left female breast: Secondary | ICD-10-CM

## 2014-11-08 DIAGNOSIS — Z5111 Encounter for antineoplastic chemotherapy: Secondary | ICD-10-CM | POA: Diagnosis not present

## 2014-11-08 DIAGNOSIS — R5382 Chronic fatigue, unspecified: Secondary | ICD-10-CM

## 2014-11-08 LAB — COMPREHENSIVE METABOLIC PANEL
ALBUMIN: 4.3 g/dL (ref 3.5–5.0)
ALK PHOS: 93 U/L (ref 38–126)
ALT: 37 U/L (ref 14–54)
ANION GAP: 11 (ref 5–15)
AST: 46 U/L — ABNORMAL HIGH (ref 15–41)
BILIRUBIN TOTAL: 2.9 mg/dL — AB (ref 0.3–1.2)
BUN: 23 mg/dL — ABNORMAL HIGH (ref 6–20)
CALCIUM: 8.4 mg/dL — AB (ref 8.9–10.3)
CO2: 24 mmol/L (ref 22–32)
CREATININE: 1.41 mg/dL — AB (ref 0.44–1.00)
Chloride: 97 mmol/L — ABNORMAL LOW (ref 101–111)
GFR calc non Af Amer: 42 mL/min — ABNORMAL LOW (ref >60)
GFR, EST AFRICAN AMERICAN: 48 mL/min — AB (ref >60)
GLUCOSE: 165 mg/dL — AB (ref 65–99)
Potassium: 3.3 mmol/L — ABNORMAL LOW (ref 3.5–5.1)
Sodium: 132 mmol/L — ABNORMAL LOW (ref 135–145)
TOTAL PROTEIN: 7.1 g/dL (ref 6.5–8.1)

## 2014-11-08 LAB — CBC WITH DIFFERENTIAL/PLATELET
Basophils Absolute: 0.1 10*3/uL (ref 0–0.1)
Basophils Relative: 1 %
Eosinophils Absolute: 0.7 10*3/uL (ref 0–0.7)
Eosinophils Relative: 12 %
HEMATOCRIT: 33.9 % — AB (ref 35.0–47.0)
HEMOGLOBIN: 11.4 g/dL — AB (ref 12.0–16.0)
LYMPHS ABS: 1.8 10*3/uL (ref 1.0–3.6)
Lymphocytes Relative: 28 %
MCH: 29 pg (ref 26.0–34.0)
MCHC: 33.8 g/dL (ref 32.0–36.0)
MCV: 85.8 fL (ref 80.0–100.0)
MONOS PCT: 6 %
Monocytes Absolute: 0.4 10*3/uL (ref 0.2–0.9)
NEUTROS ABS: 3.4 10*3/uL (ref 1.4–6.5)
NEUTROS PCT: 53 %
Platelets: 289 10*3/uL (ref 150–440)
RBC: 3.95 MIL/uL (ref 3.80–5.20)
RDW: 19.3 % — ABNORMAL HIGH (ref 11.5–14.5)
WBC: 6.4 10*3/uL (ref 3.6–11.0)

## 2014-11-08 MED ORDER — SODIUM CHLORIDE 0.9 % IV SOLN
Freq: Once | INTRAVENOUS | Status: AC
  Administered 2014-11-08: 12:00:00 via INTRAVENOUS
  Filled 2014-11-08: qty 1000

## 2014-11-08 MED ORDER — SODIUM CHLORIDE 0.9 % IJ SOLN
10.0000 mL | Freq: Once | INTRAMUSCULAR | Status: AC
  Administered 2014-11-08: 10 mL via INTRAVENOUS
  Filled 2014-11-08: qty 10

## 2014-11-08 MED ORDER — SODIUM CHLORIDE 0.9 % IV SOLN
INTRAVENOUS | Status: DC
  Administered 2014-11-08: 12:00:00 via INTRAVENOUS
  Filled 2014-11-08: qty 1000

## 2014-11-08 MED ORDER — HEPARIN SOD (PORK) LOCK FLUSH 100 UNIT/ML IV SOLN
500.0000 [IU] | Freq: Once | INTRAVENOUS | Status: AC
  Administered 2014-11-08: 500 [IU] via INTRAVENOUS
  Filled 2014-11-08: qty 5

## 2014-11-08 NOTE — Telephone Encounter (Signed)
Come in for lab CBC, METC  IVF per Dr Grayland Ormond. Will be here in 45 mins per pt

## 2014-11-08 NOTE — Telephone Encounter (Signed)
Called c/o weakness,can hardly walk. B/p is up, she checked it yesterday. What to do?

## 2014-11-09 ENCOUNTER — Inpatient Hospital Stay: Payer: Medicaid Other

## 2014-11-09 ENCOUNTER — Emergency Department
Admission: EM | Admit: 2014-11-09 | Discharge: 2014-11-09 | Disposition: A | Discharge status: Home / Self Care | Payer: Medicaid Other | Attending: Emergency Medicine | Admitting: Emergency Medicine

## 2014-11-09 ENCOUNTER — Other Ambulatory Visit: Payer: Self-pay

## 2014-11-09 ENCOUNTER — Telehealth: Payer: Self-pay | Admitting: *Deleted

## 2014-11-09 ENCOUNTER — Encounter: Payer: Self-pay | Admitting: Emergency Medicine

## 2014-11-09 DIAGNOSIS — R42 Dizziness and giddiness: Secondary | ICD-10-CM | POA: Insufficient documentation

## 2014-11-09 DIAGNOSIS — R0602 Shortness of breath: Secondary | ICD-10-CM | POA: Diagnosis not present

## 2014-11-09 DIAGNOSIS — F419 Anxiety disorder, unspecified: Secondary | ICD-10-CM | POA: Insufficient documentation

## 2014-11-09 DIAGNOSIS — I10 Essential (primary) hypertension: Secondary | ICD-10-CM | POA: Insufficient documentation

## 2014-11-09 DIAGNOSIS — R531 Weakness: Secondary | ICD-10-CM | POA: Diagnosis not present

## 2014-11-09 LAB — CBC WITH DIFFERENTIAL/PLATELET
Basophils Absolute: 0 10*3/uL (ref 0–0.1)
Basophils Relative: 0 %
EOS ABS: 0.1 10*3/uL (ref 0–0.7)
EOS PCT: 2 %
HCT: 28.7 % — ABNORMAL LOW (ref 35.0–47.0)
HEMOGLOBIN: 9.9 g/dL — AB (ref 12.0–16.0)
LYMPHS ABS: 1.2 10*3/uL (ref 1.0–3.6)
LYMPHS PCT: 26 %
MCH: 29.7 pg (ref 26.0–34.0)
MCHC: 34.4 g/dL (ref 32.0–36.0)
MCV: 86.4 fL (ref 80.0–100.0)
MONOS PCT: 10 %
Monocytes Absolute: 0.4 10*3/uL (ref 0.2–0.9)
Neutro Abs: 2.8 10*3/uL (ref 1.4–6.5)
Neutrophils Relative %: 62 %
Platelets: 198 10*3/uL (ref 150–440)
RBC: 3.32 MIL/uL — AB (ref 3.80–5.20)
RDW: 19 % — ABNORMAL HIGH (ref 11.5–14.5)
WBC: 4.5 10*3/uL (ref 3.6–11.0)

## 2014-11-09 LAB — COMPREHENSIVE METABOLIC PANEL
ALK PHOS: 86 U/L (ref 38–126)
ALT: 27 U/L (ref 14–54)
ANION GAP: 9 (ref 5–15)
AST: 31 U/L (ref 15–41)
Albumin: 3.5 g/dL (ref 3.5–5.0)
BUN: 20 mg/dL (ref 6–20)
CALCIUM: 8.8 mg/dL — AB (ref 8.9–10.3)
CO2: 27 mmol/L (ref 22–32)
CREATININE: 1.15 mg/dL — AB (ref 0.44–1.00)
Chloride: 102 mmol/L (ref 101–111)
GFR, EST NON AFRICAN AMERICAN: 53 mL/min — AB (ref >60)
Glucose, Bld: 117 mg/dL — ABNORMAL HIGH (ref 65–99)
Potassium: 3.3 mmol/L — ABNORMAL LOW (ref 3.5–5.1)
SODIUM: 138 mmol/L (ref 135–145)
TOTAL PROTEIN: 6.1 g/dL — AB (ref 6.5–8.1)
Total Bilirubin: 1.9 mg/dL — ABNORMAL HIGH (ref 0.3–1.2)

## 2014-11-09 LAB — TROPONIN I

## 2014-11-09 MED ORDER — ACETAMINOPHEN 500 MG PO TABS
1000.0000 mg | ORAL_TABLET | Freq: Once | ORAL | Status: DC
Start: 2014-11-09 — End: 2014-11-09
  Filled 2014-11-09: qty 2

## 2014-11-09 MED ORDER — SODIUM CHLORIDE 0.9 % IV SOLN
1000.0000 mL | Freq: Once | INTRAVENOUS | Status: AC
  Administered 2014-11-09: 1000 mL via INTRAVENOUS

## 2014-11-09 MED ORDER — MORPHINE SULFATE (PF) 4 MG/ML IV SOLN
4.0000 mg | Freq: Once | INTRAVENOUS | Status: AC
  Administered 2014-11-09: 4 mg via INTRAVENOUS
  Filled 2014-11-09: qty 1

## 2014-11-09 MED ORDER — DIAZEPAM 5 MG PO TABS: 5.0000 mg | ORAL_TABLET | Freq: Three times a day (TID) | ORAL | Status: DC | PRN

## 2014-11-09 MED ORDER — DIAZEPAM 5 MG/ML IJ SOLN
5.0000 mg | Freq: Once | INTRAMUSCULAR | Status: AC
  Administered 2014-11-09: 5 mg via INTRAVENOUS
  Filled 2014-11-09: qty 2

## 2014-11-09 NOTE — ED Provider Notes (Signed)
[facility] Emergency Department Provider Note     Time seen: ----------------------------------------- 12:04 PM on 11/09/2014 -----------------------------------------    I have reviewed the triage vital signs and the nursing notes.   HISTORY  Chief Complaint Dizziness; Weakness; and Shortness of Breath    HPI Robin Howard is a 54 y.o. female who presents ER for generalized ill feeling, weakness and dizziness. She had shortness of breath on Friday, went to the cancer center yesterday where they gave her fluids and her blood. She is brought in here crying, currently being treated for breast cancer. Denies any other complaints at this time.   Past Medical History  Diagnosis Date  . Major depressive disorder, recurrent ([name])   . HTN (hypertension)   . Overactive bladder   . Migraines   . Gastritis   . Hypothyroidism   . OSA (obstructive sleep apnea)     untreated due to lack of insurance  . Insomnia   . GERD (gastroesophageal reflux disease)   . Pericarditis 1997  . Detached retina 1997  . Chronic headache   . Morbid obesity ([name])   . NSTEMI (non-ST elevated myocardial infarction) ([name]) 04/16/14    Due to demand- normal Cath 04/19/14  . Breast cancer ([name])   . Bronchitis 04-2014  . Shortness of breath dyspnea     with exertion only  . Anemia     h/o  . Pericarditis 1997  . Family history of adverse reaction to anesthesia     pts dad has pseudocholinestrace deficieny    Patient Active Problem List   Diagnosis Date Noted  . Colitis 09/23/2014  . Breast cancer, female ([name]) 08/10/2014  . Migraine 07/15/2014  . Fatigue 07/15/2014  . Major depressive disorder, recurrent ([name])   . HTN (hypertension)   . Overactive bladder   . Hypothyroidism   . OSA (obstructive sleep apnea)   . GERD (gastroesophageal reflux disease)   . Chronic headache     Past Surgical History  Procedure Laterality Date  . Back surgery      L4-5  . Knee surgery     . Cesarean section    . Coronary angioplasty  April 2016  . Retinal detachment surgery Right 1997  . Foot surgery    . Breast lumpectomy with sentinel lymph node biopsy Left 08/13/2014    Procedure: BREAST LUMPECTOMY WITH SENTINEL LYMPH NODE BIOPSY;  Surgeon: Robert Bellow, MD;  Location: [facility] ORS;  Service: General;  Laterality: Left;  . Breast mammosite Left 08/30/2014    Procedure: MAMMOSITE BREAST;  Surgeon: Robert Bellow, MD;  Location: [facility] ORS;  Service: General;  Laterality: Left;  . Portacath placement Right 08/30/2014    Procedure: INSERTION PORT-A-CATH;  Surgeon: Robert Bellow, MD;  Location: [facility] ORS;  Service: General;  Laterality: Right;    Allergies Reglan; Paxil; Tegaderm ag mesh; and Ultram  Social History Social History  Substance Use Topics  . Smoking status: Never Smoker   . Smokeless tobacco: Never Used  . Alcohol Use: No    Review of Systems Constitutional: Negative for fever. Eyes: Negative for visual changes. ENT: Negative for sore throat. Cardiovascular: Negative for chest pain. Respiratory: Positive for shortness of breath Gastrointestinal: Negative for abdominal pain, vomiting and diarrhea. Genitourinary: Negative for dysuria. Musculoskeletal: Negative for back pain. Skin: Negative for rash. Neurological: Negative for headaches, positive for weakness and dizziness  10-point ROS otherwise negative.  ____________________________________________   PHYSICAL EXAM:  VITAL SIGNS: ED Triage Vitals  Enc Vitals Group     BP 11/09/14 1157 124/88 mmHg     Pulse Rate 11/09/14 1157 117     Resp 11/09/14 1157 18     Temp 11/09/14 1157 98.5 F (36.9 C)     Temp Source 11/09/14 1157 Oral     SpO2 11/09/14 1157 100 %     Weight 11/09/14 1157 230 lb (104.327 kg)     Height 11/09/14 1157 5\' 8"  (1.727 m)     Head Cir --      Peak Flow --      Pain Score --      Pain Loc --      Pain Edu? --      Excl. in [place]? --     Constitutional: Alert  and oriented. Well appearing and in no distress. Anxious Eyes: Conjunctivae are normal. PERRL. Normal extraocular movements. ENT   Head: Normocephalic and atraumatic.   Nose: No congestion/rhinnorhea.   Mouth/Throat: Mucous membranes are moist.   Neck: No stridor. Cardiovascular: Normal rate, regular rhythm. Normal and symmetric distal pulses are present in all extremities. No murmurs, rubs, or gallops. Respiratory: Normal respiratory effort without tachypnea nor retractions. Breath sounds are clear and equal bilaterally. No wheezes/rales/rhonchi. Gastrointestinal: Soft and nontender. No distention. No abdominal bruits.  Musculoskeletal: Nontender with normal range of motion in all extremities. No joint effusions.  No lower extremity tenderness nor edema. Neurologic:  Normal speech and language. No gross focal neurologic deficits are appreciated. Speech is normal. No gait instability. Skin:  Skin is warm, dry and intact. No rash noted. Psychiatric: Mood and affect are normal. Speech and behavior are normal. Patient exhibits appropriate insight and judgment. ____________________________________________  EKG: Interpreted by me. Sinus tachycardia with a rate of 115 bpm, nonspecific ST and T wave changes, normal QRS with, normal QT interval.  ____________________________________________  ED COURSE:  Pertinent labs & imaging results that were available during my care of the patient were reviewed by me and considered in my medical decision making (see chart for details). Unclear etiology for symptoms. We'll check basic labs and give fluids. ____________________________________________    LABS (pertinent positives/negatives)  Labs Reviewed  CBC WITH DIFFERENTIAL/PLATELET - Abnormal; Notable for the following:    RBC 3.32 (*)    Hemoglobin 9.9 (*)    HCT 28.7 (*)    RDW 19.0 (*)    All other components within normal limits  COMPREHENSIVE METABOLIC PANEL - Abnormal; Notable for  the following:    Potassium 3.3 (*)    Glucose, Bld 117 (*)    Creatinine, Ser 1.15 (*)    Calcium 8.8 (*)    Total Protein 6.1 (*)    Total Bilirubin 1.9 (*)    GFR calc non Af Amer 53 (*)    All other components within normal limits  TROPONIN I  URINALYSIS COMPLETEWITH MICROSCOPIC ([facility] ONLY)  ____________________________________________  FINAL ASSESSMENT AND PLAN  Weakness, dizziness  Plan: Patient with labs and imaging as dictated above. No clear etiology for the dizziness. This may be anxiety related. She is feeling better, labs are grossly unremarkable or stable according to her baseline. She is stable for discharge.   Earleen Newport, MD   Earleen Newport, MD 11/09/14 [phone]

## 2014-11-09 NOTE — Discharge Instructions (Signed)

## 2014-11-09 NOTE — ED Notes (Signed)
Pt is currently being treated for breast cancer. Pt had third treatment on Tuesday 10/18. On Friday 10/21 the pt became SOB, weak and dizzy. The pt went to the cancer center yesterday where they gave her fluids and drew blood. Pt is crying at triage.

## 2014-11-09 NOTE — Telephone Encounter (Signed)
Called inquiring if she was to keep lab appt today. I explained that if it was the same, she did not need to come in today. She then stated that she was told if she is not feeling better today that she was to call and let us know, she c/o dizziness, sob and feeling lightheaded. After discussing with Dr Grayland Ormond and looking at [place] labs, she was advised to go to ER to be evaluated. She was hesitant, but agreed to go be evaluated.

## 2014-11-14 NOTE — Progress Notes (Signed)
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  Chief Complaint  Patient presents with  . Breast Cancer    Follow up    INTERVAL HISTORY:  Patient returns to clinic today for further evaluation and consideration of cycle 3 of 6 of Taxotere, carboplatinum, and Herceptin. She continues to have persistent nausea and diarrhea.  She has no neurologic complaints. She denies any recent fevers or illnesses. She has a good appetite and denies weight loss. She has no chest pain or shortness of breath. She has no urinary complaints. Patient offers no further specific complaints.  REVIEW OF SYSTEMS:   Review of Systems  Constitutional: Negative.   Respiratory: Negative.   Cardiovascular: Negative.   Gastrointestinal: Positive for nausea and diarrhea. Negative for abdominal pain.  Neurological: Negative.   Psychiatric/Behavioral: The patient is nervous/anxious.     As per HPI. Otherwise, a complete review of systems is negatve.  PAST MEDICAL HISTORY: Past Medical History  Diagnosis Date  . Major depressive disorder, recurrent ([place])   . HTN (hypertension)   . Overactive bladder   . Migraines   . Gastritis   . Hypothyroidism   . OSA (obstructive sleep apnea)     untreated due to lack of insurance  . Insomnia   . GERD (gastroesophageal reflux disease)   . Pericarditis 1997  . Detached retina 1997  . Chronic headache   . Morbid obesity ([place])   . NSTEMI (non-ST elevated myocardial infarction) ([place]) 04/16/14    Due to demand- normal Cath 04/19/14  . Breast cancer ([place])   . Bronchitis 04-2014  . Shortness of breath dyspnea       with exertion only  . Anemia     h/o  . Pericarditis 1997  . Family history of adverse reaction to anesthesia     pts dad has pseudocholinestrace deficieny    PAST SURGICAL HISTORY: Past Surgical History  Procedure Laterality Date  . Back surgery      L4-5  . Knee surgery    . Cesarean section    . Coronary angioplasty  April 2016  . Retinal detachment surgery Right 1997  . Foot surgery    . Breast lumpectomy with sentinel lymph node biopsy Left 08/13/2014    Procedure: BREAST LUMPECTOMY WITH SENTINEL LYMPH NODE BIOPSY;  Surgeon: Robert Bellow, MD;  Location: [facility] ORS;  Service: General;  Laterality: Left;  . Breast mammosite Left 08/30/2014    Procedure: MAMMOSITE BREAST;  Surgeon: Robert Bellow, MD;  Location: [facility] ORS;  Service: General;  Laterality: Left;  . Portacath placement Right 08/30/2014    Procedure: INSERTION PORT-A-CATH;  Surgeon: Robert Bellow, MD;  Location: [facility] ORS;  Service: General;  Laterality: Right;    FAMILY HISTORY Family History  Problem Relation Age of Onset  . Alcohol abuse Mother   . Thyroid disease Mother   . Hypertension Mother   . COPD Mother   . Cancer Father     liposarcoma  . Congestive Heart Failure Father   . Depression Father   . Hypertension Father   . Cancer Maternal Uncle     Pancreatic cancer  . Heart  disease Paternal Grandfather 36    MI  . Diabetes Other   . Cancer Sister     breast  . Breast cancer Sister 35       ADVANCED DIRECTIVES:    HEALTH MAINTENANCE: Social History  Substance Use Topics  . Smoking status: Never Smoker   . Smokeless tobacco: Never Used  . Alcohol Use: No     Colonoscopy:  PAP:  Bone density:  Lipid panel:  Allergies  Allergen Reactions  . Reglan [Metoclopramide] Shortness Of Breath and Other (See Comments)    Dystonic Reaction  . Paxil [Paroxetine Hcl] Other (See Comments)    Blurred vision  . Tegaderm Ag Mesh [Silver] Rash  . Ultram [Tramadol] Itching and Rash     Current Outpatient Prescriptions  Medication Sig Dispense Refill  . aspirin EC 81 MG tablet Take 81 mg by mouth daily.    . cholecalciferol (VITAMIN D) 1000 UNITS tablet Take 1,000 Units by mouth daily.    . diphenhydrAMINE (BENADRYL) 25 MG tablet Take 25 mg by mouth every 6 (six) hours as needed for allergies.     . diphenoxylate-atropine (LOMOTIL) 2.5-0.025 MG per tablet Take 2 tablets by mouth 4 (four) times daily as needed for diarrhea or loose stools. 60 tablet 2  . escitalopram (LEXAPRO) 20 MG tablet Take 1 tablet (20 mg total) by mouth daily. 30 tablet 2  . HYDROcodone-acetaminophen (NORCO) 5-325 MG per tablet Take 1 tablet by mouth every 4 (four) hours as needed for moderate pain. 30 tablet 0  . levothyroxine (SYNTHROID, LEVOTHROID) 100 MCG tablet Take 1 tablet (100 mcg total) by mouth daily. 30 tablet 11  . lidocaine-prilocaine (EMLA) cream Apply 1 application topically once. Apply cream to port 1-2 hours prior to chemotherapy appointment, cover with plastic wrap. 30 g 0  . Multiple Vitamin (MULTIVITAMIN) capsule Take 1 capsule by mouth daily.    . prochlorperazine (COMPAZINE) 10 MG tablet Take 1 tablet (10 mg total) by mouth every 6 (six) hours as needed for nausea or vomiting. 30 tablet 1  . ranitidine (ZANTAC) 300 MG tablet Take 1 tablet (300 mg total) by mouth at bedtime. This replaces omeprazole 30 tablet 5  . vitamin B-12 (CYANOCOBALAMIN) 1000 MCG tablet Take 1,000 mcg by mouth daily.    . diazepam (VALIUM) 5 MG tablet Take 1 tablet (5 mg total) by mouth every 8 (eight) hours as needed for anxiety. 30 tablet 0  . ondansetron (ZOFRAN ODT) 4 MG disintegrating tablet Take 1 tablet (4 mg total) by mouth every 8 (eight) hours as needed for nausea or vomiting. (Patient not taking: Reported on 11/02/2014) 20 tablet 0  . potassium chloride SA (K-DUR,KLOR-CON) 20 MEQ tablet Take 1 tablet (20 mEq total) by mouth 2 (two) times daily. 60 tablet 2   No current facility-administered  medications for this visit.    OBJECTIVE: Filed Vitals:   11/02/14 0935  BP: 141/94  Pulse: 78  Temp: 98.6 F (37 C)     Body mass index is 38.36 kg/(m^2).    ECOG FS:0 - Asymptomatic  General: Well-developed, well-nourished, no acute distress. Eyes: Pink conjunctiva, anicteric sclera. Breasts: Exam deferred today. Lungs: Clear to auscultation bilaterally. Heart: Regular rate and rhythm. No rubs, murmurs, or gallops. Abdomen: Soft, nontender, nondistended. No organomegaly noted, normoactive bowel sounds. Musculoskeletal: No edema, cyanosis, or clubbing. Neuro: Alert, answering all questions appropriately. Cranial nerves grossly intact. Skin: No rashes or petechiae noted. Psych: Normal affect.   LAB RESULTS:  Lab Results  Component Value Date   NA 138 11/09/2014   K 3.3* 11/09/2014   CL 102 11/09/2014   CO2 27 11/09/2014   GLUCOSE 117* 11/09/2014   BUN 20 11/09/2014   CREATININE 1.15* 11/09/2014   CALCIUM 8.8* 11/09/2014   PROT 6.1* 11/09/2014   ALBUMIN 3.5 11/09/2014   AST 31 11/09/2014   ALT 27 11/09/2014   ALKPHOS 86 11/09/2014   BILITOT 1.9* 11/09/2014   GFRNONAA 53* 11/09/2014   GFRAA >60 11/09/2014    Lab Results  Component Value Date   WBC 4.5 11/09/2014   NEUTROABS 2.8 11/09/2014   HGB 9.9* 11/09/2014   HCT 28.7* 11/09/2014   MCV 86.4 11/09/2014   PLT 198 11/09/2014     STUDIES: No results found.  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  PLAN:    1. Breast cancer: Given the fact that patient is HER-2 positive, she will benefit from adjuvant chemotherapy. Patient has now completed MammoSite radiation. Pretreatment MUGA reported an ejection fraction of 52% Proceed with cycle 3 of 6 of carboplatinum and Herceptin.  Given patient's persistent diarrhea, will discontinue Taxotere to assess if there is any improvement. Patient also will require a total of 18 treatments of Herceptin every 3 weeks for a  total of one year.  Patient will not require Neulasta since she is not receiving Taxotere. Return to clinic in 1 week for laboratory work and then in 3 weeks for consideration of cycle 4. She does not require an aromatase inhibitor given the fact that her tumor is ER/PR negative.   2. Diarrhea: Persisted despite dose reduction of Taxotere. Will discontinue Taxotere as above. Continue Lomotil as prescribed.  Patient expressed understanding and was in agreement with this plan. She also understands that She can call clinic at any time with any questions, concerns, or complaints.   Breast cancer, female   Staging form: Breast, AJCC 7th Edition     Pathologic stage from 08/17/2014: Stage IA (T1c, N0, cM0) - Signed by Lloyd Huger, MD on 08/17/2014   Lloyd Huger, MD   11/14/2014 8:58 PM

## 2014-11-23 ENCOUNTER — Inpatient Hospital Stay: Payer: Medicaid Other | Attending: Oncology

## 2014-11-23 ENCOUNTER — Inpatient Hospital Stay (HOSPITAL_BASED_OUTPATIENT_CLINIC_OR_DEPARTMENT_OTHER): Payer: Medicaid Other | Admitting: Oncology

## 2014-11-23 ENCOUNTER — Inpatient Hospital Stay: Payer: Medicaid Other

## 2014-11-23 ENCOUNTER — Telehealth: Payer: Self-pay | Admitting: Pharmacist

## 2014-11-23 VITALS — BP 122/72 | HR 84 | Temp 97.1°F | Resp 18 | Wt 231.9 lb

## 2014-11-23 DIAGNOSIS — R110 Nausea: Secondary | ICD-10-CM | POA: Insufficient documentation

## 2014-11-23 DIAGNOSIS — E039 Hypothyroidism, unspecified: Secondary | ICD-10-CM

## 2014-11-23 DIAGNOSIS — M129 Arthropathy, unspecified: Secondary | ICD-10-CM | POA: Insufficient documentation

## 2014-11-23 DIAGNOSIS — R630 Anorexia: Secondary | ICD-10-CM | POA: Insufficient documentation

## 2014-11-23 DIAGNOSIS — E876 Hypokalemia: Secondary | ICD-10-CM

## 2014-11-23 DIAGNOSIS — Z01818 Encounter for other preprocedural examination: Secondary | ICD-10-CM | POA: Insufficient documentation

## 2014-11-23 DIAGNOSIS — N3281 Overactive bladder: Secondary | ICD-10-CM | POA: Insufficient documentation

## 2014-11-23 DIAGNOSIS — R0609 Other forms of dyspnea: Secondary | ICD-10-CM

## 2014-11-23 DIAGNOSIS — R42 Dizziness and giddiness: Secondary | ICD-10-CM

## 2014-11-23 DIAGNOSIS — M549 Dorsalgia, unspecified: Secondary | ICD-10-CM

## 2014-11-23 DIAGNOSIS — K219 Gastro-esophageal reflux disease without esophagitis: Secondary | ICD-10-CM

## 2014-11-23 DIAGNOSIS — Z800 Family history of malignant neoplasm of digestive organs: Secondary | ICD-10-CM

## 2014-11-23 DIAGNOSIS — R531 Weakness: Secondary | ICD-10-CM | POA: Diagnosis not present

## 2014-11-23 DIAGNOSIS — R5383 Other fatigue: Secondary | ICD-10-CM | POA: Insufficient documentation

## 2014-11-23 DIAGNOSIS — G4730 Sleep apnea, unspecified: Secondary | ICD-10-CM

## 2014-11-23 DIAGNOSIS — Z171 Estrogen receptor negative status [ER-]: Secondary | ICD-10-CM | POA: Diagnosis not present

## 2014-11-23 DIAGNOSIS — Z809 Family history of malignant neoplasm, unspecified: Secondary | ICD-10-CM

## 2014-11-23 DIAGNOSIS — Z803 Family history of malignant neoplasm of breast: Secondary | ICD-10-CM | POA: Insufficient documentation

## 2014-11-23 DIAGNOSIS — C50912 Malignant neoplasm of unspecified site of left female breast: Secondary | ICD-10-CM

## 2014-11-23 DIAGNOSIS — I319 Disease of pericardium, unspecified: Secondary | ICD-10-CM | POA: Diagnosis not present

## 2014-11-23 DIAGNOSIS — R419 Unspecified symptoms and signs involving cognitive functions and awareness: Secondary | ICD-10-CM | POA: Insufficient documentation

## 2014-11-23 DIAGNOSIS — Z8669 Personal history of other diseases of the nervous system and sense organs: Secondary | ICD-10-CM | POA: Insufficient documentation

## 2014-11-23 DIAGNOSIS — F329 Major depressive disorder, single episode, unspecified: Secondary | ICD-10-CM

## 2014-11-23 DIAGNOSIS — D649 Anemia, unspecified: Secondary | ICD-10-CM

## 2014-11-23 DIAGNOSIS — E669 Obesity, unspecified: Secondary | ICD-10-CM | POA: Diagnosis not present

## 2014-11-23 DIAGNOSIS — I10 Essential (primary) hypertension: Secondary | ICD-10-CM | POA: Diagnosis not present

## 2014-11-23 DIAGNOSIS — R197 Diarrhea, unspecified: Secondary | ICD-10-CM | POA: Insufficient documentation

## 2014-11-23 DIAGNOSIS — I252 Old myocardial infarction: Secondary | ICD-10-CM

## 2014-11-23 DIAGNOSIS — Z79899 Other long term (current) drug therapy: Secondary | ICD-10-CM

## 2014-11-23 DIAGNOSIS — Z7982 Long term (current) use of aspirin: Secondary | ICD-10-CM | POA: Diagnosis not present

## 2014-11-23 LAB — CBC WITH DIFFERENTIAL/PLATELET
BASOS ABS: 0 10*3/uL (ref 0–0.1)
BASOS PCT: 0 %
EOS ABS: 0.1 10*3/uL (ref 0–0.7)
EOS PCT: 4 %
HCT: 22.7 % — ABNORMAL LOW (ref 35.0–47.0)
Hemoglobin: 7.7 g/dL — ABNORMAL LOW (ref 12.0–16.0)
Lymphocytes Relative: 49 %
Lymphs Abs: 1.4 10*3/uL (ref 1.0–3.6)
MCH: 29.9 pg (ref 26.0–34.0)
MCHC: 33.9 g/dL (ref 32.0–36.0)
MCV: 88.3 fL (ref 80.0–100.0)
MONO ABS: 0.4 10*3/uL (ref 0.2–0.9)
Monocytes Relative: 13 %
Neutro Abs: 1 10*3/uL — ABNORMAL LOW (ref 1.4–6.5)
Neutrophils Relative %: 34 %
PLATELETS: 110 10*3/uL — AB (ref 150–440)
RBC: 2.58 MIL/uL — ABNORMAL LOW (ref 3.80–5.20)
RDW: 19.3 % — AB (ref 11.5–14.5)
WBC: 3 10*3/uL — ABNORMAL LOW (ref 3.6–11.0)

## 2014-11-23 LAB — COMPREHENSIVE METABOLIC PANEL
ALT: 29 U/L (ref 14–54)
ANION GAP: 8 (ref 5–15)
AST: 41 U/L (ref 15–41)
Albumin: 3.7 g/dL (ref 3.5–5.0)
Alkaline Phosphatase: 92 U/L (ref 38–126)
BUN: 14 mg/dL (ref 6–20)
CHLORIDE: 100 mmol/L — AB (ref 101–111)
CO2: 29 mmol/L (ref 22–32)
Calcium: 8.5 mg/dL — ABNORMAL LOW (ref 8.9–10.3)
Creatinine, Ser: 1.03 mg/dL — ABNORMAL HIGH (ref 0.44–1.00)
Glucose, Bld: 139 mg/dL — ABNORMAL HIGH (ref 65–99)
POTASSIUM: 2.9 mmol/L — AB (ref 3.5–5.1)
Sodium: 137 mmol/L (ref 135–145)
Total Bilirubin: 0.7 mg/dL (ref 0.3–1.2)
Total Protein: 6.3 g/dL — ABNORMAL LOW (ref 6.5–8.1)

## 2014-11-23 MED ORDER — HEPARIN SOD (PORK) LOCK FLUSH 100 UNIT/ML IV SOLN
500.0000 [IU] | Freq: Once | INTRAVENOUS | Status: AC | PRN
Start: 2014-11-23 — End: 2014-11-23
  Administered 2014-11-23: 500 [IU]
  Filled 2014-11-23 (×2): qty 5

## 2014-11-23 MED ORDER — SODIUM CHLORIDE 0.9 % IV SOLN
Freq: Once | INTRAVENOUS | Status: AC
  Administered 2014-11-23: 13:00:00 via INTRAVENOUS
  Filled 2014-11-23: qty 250

## 2014-11-23 MED ORDER — TRASTUZUMAB CHEMO INJECTION 440 MG
6.0000 mg/kg | Freq: Once | INTRAVENOUS | Status: AC
  Administered 2014-11-23: 651 mg via INTRAVENOUS
  Filled 2014-11-23: qty 31

## 2014-11-23 MED ORDER — POTASSIUM CHLORIDE 2 MEQ/ML IV SOLN: 40.0000 meq | Freq: Once | INTRAVENOUS | Status: DC

## 2014-11-23 MED ORDER — SODIUM CHLORIDE 0.9 % IV SOLN
Freq: Once | INTRAVENOUS | Status: AC
  Administered 2014-11-23: 11:00:00 via INTRAVENOUS
  Filled 2014-11-23: qty 1000

## 2014-11-23 MED ORDER — DIPHENHYDRAMINE HCL 25 MG PO CAPS
50.0000 mg | ORAL_CAPSULE | Freq: Once | ORAL | Status: AC
  Administered 2014-11-23: 50 mg via ORAL
  Filled 2014-11-23: qty 2

## 2014-11-23 MED ORDER — ACETAMINOPHEN 325 MG PO TABS
650.0000 mg | ORAL_TABLET | Freq: Once | ORAL | Status: AC
  Administered 2014-11-23: 650 mg via ORAL
  Filled 2014-11-23: qty 2

## 2014-11-23 NOTE — Progress Notes (Signed)
Patient is tearful today because she is not dealing with back pain along with her side effects of treatment.  After her last treatment she was feeling dizzy, lightheaded, weak, without diarrhea, despite coming in for additional fluids.

## 2014-11-23 NOTE — Telephone Encounter (Signed)
ANC= 1.0. MD is going to give only herceptin today.

## 2014-11-30 ENCOUNTER — Inpatient Hospital Stay: Payer: Medicaid Other | Admitting: *Deleted

## 2014-11-30 ENCOUNTER — Inpatient Hospital Stay: Payer: Medicaid Other

## 2014-11-30 VITALS — BP 122/85 | HR 82 | Temp 98.3°F

## 2014-11-30 DIAGNOSIS — C50912 Malignant neoplasm of unspecified site of left female breast: Secondary | ICD-10-CM

## 2014-11-30 DIAGNOSIS — E876 Hypokalemia: Secondary | ICD-10-CM

## 2014-11-30 DIAGNOSIS — G4733 Obstructive sleep apnea (adult) (pediatric): Secondary | ICD-10-CM

## 2014-11-30 LAB — CBC WITH DIFFERENTIAL/PLATELET
BASOS ABS: 0 10*3/uL (ref 0–0.1)
BASOS PCT: 1 %
EOS PCT: 9 %
Eosinophils Absolute: 0.4 10*3/uL (ref 0–0.7)
HCT: 28.6 % — ABNORMAL LOW (ref 35.0–47.0)
Hemoglobin: 9.9 g/dL — ABNORMAL LOW (ref 12.0–16.0)
LYMPHS PCT: 47 %
Lymphs Abs: 2 10*3/uL (ref 1.0–3.6)
MCH: 30.8 pg (ref 26.0–34.0)
MCHC: 34.5 g/dL (ref 32.0–36.0)
MCV: 89.2 fL (ref 80.0–100.0)
MONO ABS: 0.4 10*3/uL (ref 0.2–0.9)
Monocytes Relative: 9 %
NEUTROS ABS: 1.4 10*3/uL (ref 1.4–6.5)
Neutrophils Relative %: 34 %
PLATELETS: 449 10*3/uL — AB (ref 150–440)
RBC: 3.21 MIL/uL — AB (ref 3.80–5.20)
RDW: 22.7 % — AB (ref 11.5–14.5)
WBC: 4.2 10*3/uL (ref 3.6–11.0)

## 2014-11-30 LAB — BASIC METABOLIC PANEL
ANION GAP: 13 (ref 5–15)
BUN: 16 mg/dL (ref 6–20)
CALCIUM: 9.3 mg/dL (ref 8.9–10.3)
CO2: 23 mmol/L (ref 22–32)
Chloride: 99 mmol/L — ABNORMAL LOW (ref 101–111)
Creatinine, Ser: 1.24 mg/dL — ABNORMAL HIGH (ref 0.44–1.00)
GFR calc Af Amer: 56 mL/min — ABNORMAL LOW (ref >60)
GFR, EST NON AFRICAN AMERICAN: 49 mL/min — AB (ref >60)
Glucose, Bld: 151 mg/dL — ABNORMAL HIGH (ref 65–99)
POTASSIUM: 3.3 mmol/L — AB (ref 3.5–5.1)
SODIUM: 135 mmol/L (ref 135–145)

## 2014-11-30 MED ORDER — SODIUM CHLORIDE 0.9 % IV SOLN
Freq: Once | INTRAVENOUS | Status: AC
  Administered 2014-11-30: 13:00:00 via INTRAVENOUS
  Filled 2014-11-30: qty 100

## 2014-11-30 MED ORDER — POTASSIUM CHLORIDE 20 MEQ/100ML IV SOLN: 20.0000 meq | Freq: Once | INTRAVENOUS | Status: DC

## 2014-11-30 MED ORDER — SODIUM CHLORIDE 0.9 % IV SOLN
Freq: Once | INTRAVENOUS | Status: AC
  Administered 2014-11-30: 12:00:00 via INTRAVENOUS
  Filled 2014-11-30: qty 1000

## 2014-11-30 MED ORDER — HEPARIN SOD (PORK) LOCK FLUSH 100 UNIT/ML IV SOLN
500.0000 [IU] | Freq: Once | INTRAVENOUS | Status: AC
  Administered 2014-11-30: 500 [IU] via INTRAVENOUS
  Filled 2014-11-30: qty 5

## 2014-11-30 MED ORDER — SODIUM CHLORIDE 0.9 % IJ SOLN
10.0000 mL | INTRAMUSCULAR | Status: DC | PRN
Start: 2014-11-30 — End: 2016-11-20
  Administered 2014-11-30: 10 mL
  Filled 2014-11-30: qty 10

## 2014-12-05 NOTE — Progress Notes (Signed)
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  Chief Complaint  Patient presents with  . Breast Cancer    INTERVAL HISTORY:  Patient returns to clinic today for further evaluation and consideration of cycle 4 treatment. After her last treatment she continued to feel increased dizziness and weakness despite receiving additional IV fluids. She did not have any further diarrhea. She also is complaining of increasing back pain.  She has no neurologic complaints. She denies any recent fevers or illnesses. She has a poor appetite, but denies weight loss. She has no chest pain or shortness of breath. She has no urinary complaints. Patient offers no further specific complaints.  REVIEW OF SYSTEMS:   Review of Systems  Constitutional: Positive for malaise/fatigue. Negative for fever.  Respiratory: Negative.   Cardiovascular: Negative.   Gastrointestinal: Positive for nausea. Negative for abdominal pain and diarrhea.  Musculoskeletal: Positive for back pain.  Neurological: Positive for dizziness and weakness.  Psychiatric/Behavioral: The patient is nervous/anxious.     As per HPI. Otherwise, a complete review of systems is negatve.  PAST MEDICAL HISTORY: Past Medical History  Diagnosis Date  . Major depressive disorder, recurrent ([place])   . HTN (hypertension)   . Overactive bladder   . Migraines   . Gastritis   . Hypothyroidism   . OSA (obstructive sleep apnea)     untreated due to lack of insurance  . Insomnia   . GERD (gastroesophageal reflux disease)   . Pericarditis 1997  . Detached retina 1997  . Chronic  headache   . Morbid obesity ([place])   . NSTEMI (non-ST elevated myocardial infarction) ([place]) 04/16/14    Due to demand- normal Cath 04/19/14  . Breast cancer ([place])   . Bronchitis 04-2014  . Shortness of breath dyspnea     with exertion only  . Anemia     h/o  . Pericarditis 1997  . Family history of adverse reaction to anesthesia     pts dad has pseudocholinestrace deficieny    PAST SURGICAL HISTORY: Past Surgical History  Procedure Laterality Date  . Back surgery      L4-5  . Knee surgery    . Cesarean section    . Coronary angioplasty  April 2016  . Retinal detachment surgery Right 1997  . Foot surgery    . Breast lumpectomy with sentinel lymph node biopsy Left 08/13/2014    Procedure: BREAST LUMPECTOMY WITH SENTINEL LYMPH NODE BIOPSY;  Surgeon: Robert Bellow, MD;  Location: [facility] ORS;  Service: General;  Laterality: Left;  . Breast mammosite Left 08/30/2014    Procedure: MAMMOSITE BREAST;  Surgeon: Robert Bellow, MD;  Location: [facility] ORS;  Service: General;  Laterality: Left;  . Portacath placement Right 08/30/2014    Procedure: INSERTION PORT-A-CATH;  Surgeon: Robert Bellow, MD;  Location: [facility] ORS;  Service: General;  Laterality: Right;    FAMILY HISTORY Family History  Problem Relation Age of Onset  . Alcohol abuse Mother   . Thyroid disease Mother   . Hypertension Mother   . COPD Mother   . Cancer Father     liposarcoma  . Congestive Heart Failure Father   .  Depression Father   . Hypertension Father   . Cancer Maternal Uncle     Pancreatic cancer  . Heart disease Paternal Grandfather 36    MI  . Diabetes Other   . Cancer Sister     breast  . Breast cancer Sister 37       ADVANCED DIRECTIVES:    HEALTH MAINTENANCE: Social History  Substance Use Topics  . Smoking status: Never Smoker   . Smokeless tobacco: Never Used  . Alcohol Use: No     Colonoscopy:  PAP:  Bone density:  Lipid panel:  Allergies  Allergen Reactions  . Reglan  [Metoclopramide] Shortness Of Breath and Other (See Comments)    Dystonic Reaction  . Paxil [Paroxetine Hcl] Other (See Comments)    Blurred vision  . Tegaderm Ag Mesh [Silver] Rash  . Ultram [Tramadol] Itching and Rash    Current Outpatient Prescriptions  Medication Sig Dispense Refill  . aspirin EC 81 MG tablet Take 81 mg by mouth daily.    . cholecalciferol (VITAMIN D) 1000 UNITS tablet Take 1,000 Units by mouth daily.    . diazepam (VALIUM) 5 MG tablet Take 1 tablet (5 mg total) by mouth every 8 (eight) hours as needed for anxiety. 30 tablet 0  . diphenhydrAMINE (BENADRYL) 25 MG tablet Take 25 mg by mouth every 6 (six) hours as needed for allergies.     . diphenoxylate-atropine (LOMOTIL) 2.5-0.025 MG per tablet Take 2 tablets by mouth 4 (four) times daily as needed for diarrhea or loose stools. 60 tablet 2  . escitalopram (LEXAPRO) 20 MG tablet Take 1 tablet (20 mg total) by mouth daily. 30 tablet 2  . HYDROcodone-acetaminophen (NORCO) 5-325 MG per tablet Take 1 tablet by mouth every 4 (four) hours as needed for moderate pain. 30 tablet 0  . levothyroxine (SYNTHROID, LEVOTHROID) 100 MCG tablet Take 1 tablet (100 mcg total) by mouth daily. 30 tablet 11  . lidocaine-prilocaine (EMLA) cream Apply 1 application topically once. Apply cream to port 1-2 hours prior to chemotherapy appointment, cover with plastic wrap. 30 g 0  . Multiple Vitamin (MULTIVITAMIN) capsule Take 1 capsule by mouth daily.    . ondansetron (ZOFRAN ODT) 4 MG disintegrating tablet Take 1 tablet (4 mg total) by mouth every 8 (eight) hours as needed for nausea or vomiting. 20 tablet 0  . potassium chloride SA (K-DUR,KLOR-CON) 20 MEQ tablet Take 1 tablet (20 mEq total) by mouth 2 (two) times daily. 60 tablet 2  . prochlorperazine (COMPAZINE) 10 MG tablet Take 1 tablet (10 mg total) by mouth every 6 (six) hours as needed for nausea or vomiting. 30 tablet 1  . ranitidine (ZANTAC) 300 MG tablet Take 1 tablet (300 mg total) by  mouth at bedtime. This replaces omeprazole 30 tablet 5  . vitamin B-12 (CYANOCOBALAMIN) 1000 MCG tablet Take 1,000 mcg by mouth daily.     No current facility-administered medications for this visit.   Facility-Administered Medications Ordered in Other Visits  Medication Dose Route Frequency Provider Last Rate Last Dose  . sodium chloride 0.9 % injection 10 mL  10 mL Intracatheter PRN Lloyd Huger, MD   10 mL at 11/30/14 1205    OBJECTIVE: Filed Vitals:   11/23/14 1101  BP: 122/72  Pulse: 84  Temp: 97.1 F (36.2 C)  Resp: 18     Body mass index is 35.27 kg/(m^2).    ECOG FS:1 - Symptomatic but completely ambulatory  General: Well-developed, well-nourished, no acute distress. Eyes: Pink  conjunctiva, anicteric sclera. Breasts: Exam deferred today. Lungs: Clear to auscultation bilaterally. Heart: Regular rate and rhythm. No rubs, murmurs, or gallops. Abdomen: Soft, nontender, nondistended. No organomegaly noted, normoactive bowel sounds. Musculoskeletal: No edema, cyanosis, or clubbing. Neuro: Alert, answering all questions appropriately. Cranial nerves grossly intact. Skin: No rashes or petechiae noted. Psych: Normal affect.   LAB RESULTS:  Lab Results  Component Value Date   NA 135 11/30/2014   K 3.3* 11/30/2014   CL 99* 11/30/2014   CO2 23 11/30/2014   GLUCOSE 151* 11/30/2014   BUN 16 11/30/2014   CREATININE 1.24* 11/30/2014   CALCIUM 9.3 11/30/2014   PROT 6.3* 11/23/2014   ALBUMIN 3.7 11/23/2014   AST 41 11/23/2014   ALT 29 11/23/2014   ALKPHOS 92 11/23/2014   BILITOT 0.7 11/23/2014   GFRNONAA 49* 11/30/2014   GFRAA 56* 11/30/2014    Lab Results  Component Value Date   WBC 4.2 11/30/2014   NEUTROABS 1.4 11/30/2014   HGB 9.9* 11/30/2014   HCT 28.6* 11/30/2014   MCV 89.2 11/30/2014   PLT 449* 11/30/2014     STUDIES: No results found.  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  PLAN:    1.  Breast cancer: Given the fact that patient is HER-2 positive, she will benefit from adjuvant chemotherapy. Patient has now completed MammoSite radiation. Pretreatment MUGA reported an ejection fraction of 52%.  Given patient's persistent side effects, chemotherapy has been discontinued altogether and will proceed with cycle 4 which will be Herceptin only. Patient will require a total of 18 treatments of Herceptin every 3 weeks for a total of one year. Return to clinic in 1 week for laboratory work and then in 3 weeks for consideration of cycle 5 of 18. Will get MUGA scan prior to next treatment. She does not require an aromatase inhibitor given the fact that her tumor is ER/PR negative.   2. Diarrhea: Improved. Continue Lomotil as prescribed.  Patient expressed understanding and was in agreement with this plan. She also understands that She can call clinic at any time with any questions, concerns, or complaints.   Breast cancer, female   Staging form: Breast, AJCC 7th Edition     Pathologic stage from 08/17/2014: Stage IA (T1c, N0, cM0) - Signed by Lloyd Huger, MD on 08/17/2014   Lloyd Huger, MD   12/05/2014 7:44 AM

## 2014-12-10 ENCOUNTER — Encounter
Admission: RE | Admit: 2014-12-10 | Discharge: 2014-12-10 | Disposition: A | Discharge status: Home / Self Care | Payer: Medicaid Other | Source: Ambulatory Visit | Attending: Oncology | Admitting: Oncology

## 2014-12-10 DIAGNOSIS — C50912 Malignant neoplasm of unspecified site of left female breast: Secondary | ICD-10-CM | POA: Insufficient documentation

## 2014-12-10 MED ORDER — TECHNETIUM TC 99M-LABELED RED BLOOD CELLS IV KIT
20.0000 | PACK | Freq: Once | INTRAVENOUS | Status: AC | PRN
  Administered 2014-12-10: 20.895 via INTRAVENOUS

## 2014-12-13 ENCOUNTER — Encounter: Payer: Self-pay | Admitting: *Deleted

## 2014-12-13 ENCOUNTER — Ambulatory Visit (INDEPENDENT_AMBULATORY_CARE_PROVIDER_SITE_OTHER): Payer: Medicaid Other | Admitting: General Surgery

## 2014-12-13 ENCOUNTER — Other Ambulatory Visit: Payer: Medicaid Other

## 2014-12-13 ENCOUNTER — Encounter: Payer: Self-pay | Admitting: General Surgery

## 2014-12-13 VITALS — BP 132/66 | HR 88 | Resp 16 | Ht 66.0 in | Wt 232.0 lb

## 2014-12-13 DIAGNOSIS — C50912 Malignant neoplasm of unspecified site of left female breast: Secondary | ICD-10-CM | POA: Diagnosis not present

## 2014-12-13 DIAGNOSIS — N6320 Unspecified lump in the left breast, unspecified quadrant: Secondary | ICD-10-CM

## 2014-12-13 DIAGNOSIS — N63 Unspecified lump in breast: Secondary | ICD-10-CM | POA: Diagnosis not present

## 2014-12-13 DIAGNOSIS — N6325 Unspecified lump in the left breast, overlapping quadrants: Secondary | ICD-10-CM

## 2014-12-13 HISTORY — PX: BREAST CYST ASPIRATION: SHX578

## 2014-12-13 NOTE — Progress Notes (Signed)
Patient ID: Robin Howard, female   DOB: 1960/04/10, 54 y.o.   MRN: [id number]  Chief Complaint  Patient presents with  . Follow-up    Breast Cancer    HPI EVALIE Robin Howard is a 54 y.o. female here today for a follow up for breast cancer. She had concerns about a palpable nodule on the 3:00 position of the left breast. She reports that this was mentioned that her last visit here couple months ago, although I made no notation, appreciated only immediate postsurgical changes. She's not sure that the area has increased in size since initial discovery, and she is not experiencing any discomfort at the site. The mere presence of the lesion is causing in concern.   She is currently being treated with chemotherapy, she has a treatment scheduled for 12/14/14. Patient states she has been having bad side effects from chemotherapy they may discontinue treatment and just treat with herceptin.  She does have a sore spot on the inside of her left arm. Next treatment is tomorrow.  HPI  Past Medical History  Diagnosis Date  . Major depressive disorder, recurrent ([place])   . HTN (hypertension)   . Overactive bladder   . Migraines   . Gastritis   . Hypothyroidism   . OSA (obstructive sleep apnea)     untreated due to lack of insurance  . Insomnia   . GERD (gastroesophageal reflux disease)   . Pericarditis 1997  . Detached retina 1997  . Chronic headache   . Morbid obesity ([place])   . NSTEMI (non-ST elevated myocardial infarction) ([place]) 04/16/14    Due to demand- normal Cath 04/19/14  . Breast cancer ([place])   . Bronchitis 04-2014  . Shortness of breath dyspnea     with exertion only  . Anemia     h/o  . Pericarditis 1997  . Family history of adverse reaction to anesthesia     pts dad has pseudocholinestrace deficieny    Past Surgical History  Procedure Laterality Date  . Back surgery      L4-5  . Knee surgery    . Cesarean section    . Coronary angioplasty  April 2016  . Retinal detachment surgery  Right 1997  . Foot surgery    . Breast lumpectomy with sentinel lymph node biopsy Left 08/13/2014    Procedure: BREAST LUMPECTOMY WITH SENTINEL LYMPH NODE BIOPSY;  Surgeon: Robert Bellow, MD;  Location: [facility] ORS;  Service: General;  Laterality: Left;  . Breast mammosite Left 08/30/2014    Procedure: MAMMOSITE BREAST;  Surgeon: Robert Bellow, MD;  Location: [facility] ORS;  Service: General;  Laterality: Left;  . Portacath placement Right 08/30/2014    Procedure: INSERTION PORT-A-CATH;  Surgeon: Robert Bellow, MD;  Location: [facility] ORS;  Service: General;  Laterality: Right;    Family History  Problem Relation Age of Onset  . Alcohol abuse Mother   . Thyroid disease Mother   . Hypertension Mother   . COPD Mother   . Cancer Father     liposarcoma  . Congestive Heart Failure Father   . Depression Father   . Hypertension Father   . Cancer Maternal Uncle     Pancreatic cancer  . Heart disease Paternal Grandfather 18    MI  . Diabetes Other   . Cancer Sister     breast  . Breast cancer Sister 56    Social History Social History  Substance Use Topics  . Smoking status: Never Smoker   .  Smokeless tobacco: Never Used  . Alcohol Use: No    Allergies  Allergen Reactions  . Reglan [Metoclopramide] Shortness Of Breath and Other (See Comments)    Dystonic Reaction  . Paxil [Paroxetine Hcl] Other (See Comments)    Blurred vision  . Tegaderm Ag Mesh [Silver] Rash  . Ultram [Tramadol] Itching and Rash    Current Outpatient Prescriptions  Medication Sig Dispense Refill  . aspirin EC 81 MG tablet Take 81 mg by mouth daily.    . cholecalciferol (VITAMIN D) 1000 UNITS tablet Take 1,000 Units by mouth daily.    . diazepam (VALIUM) 5 MG tablet Take 1 tablet (5 mg total) by mouth every 8 (eight) hours as needed for anxiety. 30 tablet 0  . diphenhydrAMINE (BENADRYL) 25 MG tablet Take 25 mg by mouth every 6 (six) hours as needed for allergies.     . diphenoxylate-atropine (LOMOTIL)  2.5-0.025 MG per tablet Take 2 tablets by mouth 4 (four) times daily as needed for diarrhea or loose stools. 60 tablet 2  . escitalopram (LEXAPRO) 20 MG tablet Take 1 tablet (20 mg total) by mouth daily. 30 tablet 2  . HYDROcodone-acetaminophen (NORCO) 5-325 MG per tablet Take 1 tablet by mouth every 4 (four) hours as needed for moderate pain. 30 tablet 0  . levothyroxine (SYNTHROID, LEVOTHROID) 100 MCG tablet Take 1 tablet (100 mcg total) by mouth daily. 30 tablet 11  . lidocaine-prilocaine (EMLA) cream Apply 1 application topically once. Apply cream to port 1-2 hours prior to chemotherapy appointment, cover with plastic wrap. 30 g 0  . Multiple Vitamin (MULTIVITAMIN) capsule Take 1 capsule by mouth daily.    . ondansetron (ZOFRAN ODT) 4 MG disintegrating tablet Take 1 tablet (4 mg total) by mouth every 8 (eight) hours as needed for nausea or vomiting. 20 tablet 0  . potassium chloride SA (K-DUR,KLOR-CON) 20 MEQ tablet Take 1 tablet (20 mEq total) by mouth 2 (two) times daily. 60 tablet 2  . prochlorperazine (COMPAZINE) 10 MG tablet Take 1 tablet (10 mg total) by mouth every 6 (six) hours as needed for nausea or vomiting. 30 tablet 1  . ranitidine (ZANTAC) 300 MG tablet Take 1 tablet (300 mg total) by mouth at bedtime. This replaces omeprazole 30 tablet 5  . vitamin B-12 (CYANOCOBALAMIN) 1000 MCG tablet Take 1,000 mcg by mouth daily.     No current facility-administered medications for this visit.   Facility-Administered Medications Ordered in Other Visits  Medication Dose Route Frequency Provider Last Rate Last Dose  . sodium chloride 0.9 % injection 10 mL  10 mL Intracatheter PRN Lloyd Huger, MD   10 mL at 11/30/14 1205    Review of Systems Review of Systems  Constitutional: Positive for fatigue.  Respiratory: Positive for shortness of breath.   Cardiovascular: Negative.     Blood pressure 132/66, pulse 88, resp. rate 16, height 5\' 6"  (1.676 m), weight 232 lb (105.235  kg).  Physical Exam Physical Exam  Constitutional: She is oriented to person, place, and time. She appears well-developed and well-nourished.  Eyes: Conjunctivae are normal. No scleral icterus.  Neck: Neck supple.  Cardiovascular: Normal rate, regular rhythm and normal heart sounds.   Pulmonary/Chest: Effort normal and breath sounds normal. Right breast exhibits no inverted nipple, no mass, no nipple discharge, no skin change and no tenderness. Left breast exhibits no inverted nipple, no nipple discharge, no skin change and no tenderness.    Well healed incision left breast at 6 o'clock. 3 cm  at the edge of the areolar smooth nodular at 3 o'clock  Lymphadenopathy:    She has no cervical adenopathy.    She has no axillary adenopathy.  Neurological: She is alert and oriented to person, place, and time.  Skin: Skin is warm and dry.    Data Reviewed Ultrasound examination of the left breast in the 3:00 position 2 cm of the nipple at the site of palpable mass showed a well-defined smoothly marginated heterogeneous lesion with gentle lobulations measuring 0.6 x 1.1 x 1.4 cm. Good acoustic enhancement was noted with only focal edge effect shadowing. BI-RADS-4. The patient was amenable to FNA sampling. This was completed using 1 mL of 1% plain Xylocaine. Making use of a 22-gauge needle multiple passes through multiple areas within the lesion were documented. Slides 2 were prepared. Material was consistent with fat necrosis.   Assessment    Focal nodularity at the superior aspect previous wide excision, likely fat necrosis.    Plan    The patient will be contacted when cytology results are available.    Patient to return on three months PCP: Enid Derry, MD  Robert Bellow 12/13/2014, 5:39 PM

## 2014-12-13 NOTE — Patient Instructions (Signed)
Patient to return in three months.  

## 2014-12-14 ENCOUNTER — Inpatient Hospital Stay: Payer: Medicaid Other

## 2014-12-14 ENCOUNTER — Inpatient Hospital Stay (HOSPITAL_BASED_OUTPATIENT_CLINIC_OR_DEPARTMENT_OTHER): Payer: Medicaid Other | Admitting: Oncology

## 2014-12-14 ENCOUNTER — Telehealth: Payer: Self-pay | Admitting: *Deleted

## 2014-12-14 VITALS — BP 140/98 | HR 79 | Temp 99.1°F | Resp 16 | Wt 228.2 lb

## 2014-12-14 DIAGNOSIS — Z01818 Encounter for other preprocedural examination: Secondary | ICD-10-CM

## 2014-12-14 DIAGNOSIS — I10 Essential (primary) hypertension: Secondary | ICD-10-CM

## 2014-12-14 DIAGNOSIS — R419 Unspecified symptoms and signs involving cognitive functions and awareness: Secondary | ICD-10-CM

## 2014-12-14 DIAGNOSIS — Z809 Family history of malignant neoplasm, unspecified: Secondary | ICD-10-CM

## 2014-12-14 DIAGNOSIS — Z803 Family history of malignant neoplasm of breast: Secondary | ICD-10-CM

## 2014-12-14 DIAGNOSIS — E039 Hypothyroidism, unspecified: Secondary | ICD-10-CM

## 2014-12-14 DIAGNOSIS — Z800 Family history of malignant neoplasm of digestive organs: Secondary | ICD-10-CM

## 2014-12-14 DIAGNOSIS — M129 Arthropathy, unspecified: Secondary | ICD-10-CM

## 2014-12-14 DIAGNOSIS — Z7982 Long term (current) use of aspirin: Secondary | ICD-10-CM

## 2014-12-14 DIAGNOSIS — R197 Diarrhea, unspecified: Secondary | ICD-10-CM

## 2014-12-14 DIAGNOSIS — C50912 Malignant neoplasm of unspecified site of left female breast: Secondary | ICD-10-CM | POA: Diagnosis not present

## 2014-12-14 DIAGNOSIS — Z8669 Personal history of other diseases of the nervous system and sense organs: Secondary | ICD-10-CM

## 2014-12-14 DIAGNOSIS — R42 Dizziness and giddiness: Secondary | ICD-10-CM | POA: Diagnosis not present

## 2014-12-14 DIAGNOSIS — R630 Anorexia: Secondary | ICD-10-CM

## 2014-12-14 DIAGNOSIS — G4730 Sleep apnea, unspecified: Secondary | ICD-10-CM

## 2014-12-14 DIAGNOSIS — E669 Obesity, unspecified: Secondary | ICD-10-CM

## 2014-12-14 DIAGNOSIS — D649 Anemia, unspecified: Secondary | ICD-10-CM

## 2014-12-14 DIAGNOSIS — Z171 Estrogen receptor negative status [ER-]: Secondary | ICD-10-CM

## 2014-12-14 DIAGNOSIS — M549 Dorsalgia, unspecified: Secondary | ICD-10-CM

## 2014-12-14 DIAGNOSIS — R531 Weakness: Secondary | ICD-10-CM

## 2014-12-14 DIAGNOSIS — I319 Disease of pericardium, unspecified: Secondary | ICD-10-CM

## 2014-12-14 DIAGNOSIS — I252 Old myocardial infarction: Secondary | ICD-10-CM

## 2014-12-14 DIAGNOSIS — R0609 Other forms of dyspnea: Secondary | ICD-10-CM

## 2014-12-14 DIAGNOSIS — K219 Gastro-esophageal reflux disease without esophagitis: Secondary | ICD-10-CM

## 2014-12-14 DIAGNOSIS — Z79899 Other long term (current) drug therapy: Secondary | ICD-10-CM

## 2014-12-14 DIAGNOSIS — F329 Major depressive disorder, single episode, unspecified: Secondary | ICD-10-CM

## 2014-12-14 DIAGNOSIS — R5383 Other fatigue: Secondary | ICD-10-CM

## 2014-12-14 DIAGNOSIS — N3281 Overactive bladder: Secondary | ICD-10-CM

## 2014-12-14 LAB — CBC WITH DIFFERENTIAL/PLATELET
BASOS ABS: 0.1 10*3/uL (ref 0–0.1)
Basophils Relative: 1 %
Eosinophils Absolute: 0.5 10*3/uL (ref 0–0.7)
Eosinophils Relative: 12 %
HCT: 26.6 % — ABNORMAL LOW (ref 35.0–47.0)
Hemoglobin: 9.1 g/dL — ABNORMAL LOW (ref 12.0–16.0)
LYMPHS ABS: 1.5 10*3/uL (ref 1.0–3.6)
LYMPHS PCT: 35 %
MCH: 31.6 pg (ref 26.0–34.0)
MCHC: 34.1 g/dL (ref 32.0–36.0)
MCV: 92.8 fL (ref 80.0–100.0)
Monocytes Absolute: 0.6 10*3/uL (ref 0.2–0.9)
Monocytes Relative: 15 %
NEUTROS ABS: 1.5 10*3/uL (ref 1.4–6.5)
Neutrophils Relative %: 37 %
PLATELETS: 230 10*3/uL (ref 150–440)
RBC: 2.87 MIL/uL — AB (ref 3.80–5.20)
RDW: 21.6 % — AB (ref 11.5–14.5)
WBC: 4.2 10*3/uL (ref 3.6–11.0)

## 2014-12-14 LAB — COMPREHENSIVE METABOLIC PANEL
ALBUMIN: 4 g/dL (ref 3.5–5.0)
ALT: 21 U/L (ref 14–54)
ANION GAP: 10 (ref 5–15)
AST: 29 U/L (ref 15–41)
Alkaline Phosphatase: 90 U/L (ref 38–126)
BILIRUBIN TOTAL: 1.4 mg/dL — AB (ref 0.3–1.2)
BUN: 12 mg/dL (ref 6–20)
CHLORIDE: 102 mmol/L (ref 101–111)
CO2: 28 mmol/L (ref 22–32)
Calcium: 9 mg/dL (ref 8.9–10.3)
Creatinine, Ser: 1.03 mg/dL — ABNORMAL HIGH (ref 0.44–1.00)
GFR calc Af Amer: >60 mL/min (ref >60)
GLUCOSE: 116 mg/dL — AB (ref 65–99)
POTASSIUM: 3.5 mmol/L (ref 3.5–5.1)
Sodium: 140 mmol/L (ref 135–145)
TOTAL PROTEIN: 6.6 g/dL (ref 6.5–8.1)

## 2014-12-14 MED ORDER — DIPHENHYDRAMINE HCL 25 MG PO CAPS
50.0000 mg | ORAL_CAPSULE | Freq: Once | ORAL | Status: AC
  Administered 2014-12-14: 50 mg via ORAL
  Filled 2014-12-14: qty 2

## 2014-12-14 MED ORDER — SODIUM CHLORIDE 0.9 % IV SOLN
Freq: Once | INTRAVENOUS | Status: AC
  Administered 2014-12-14: 11:00:00 via INTRAVENOUS
  Filled 2014-12-14: qty 1000

## 2014-12-14 MED ORDER — HEPARIN SOD (PORK) LOCK FLUSH 100 UNIT/ML IV SOLN
500.0000 [IU] | Freq: Once | INTRAVENOUS | Status: AC | PRN
  Administered 2014-12-14: 500 [IU]
  Filled 2014-12-14: qty 5

## 2014-12-14 MED ORDER — TRASTUZUMAB CHEMO INJECTION 440 MG
6.0000 mg/kg | Freq: Once | INTRAVENOUS | Status: AC
  Administered 2014-12-14: 651 mg via INTRAVENOUS
  Filled 2014-12-14: qty 31

## 2014-12-14 MED ORDER — ACETAMINOPHEN 325 MG PO TABS
650.0000 mg | ORAL_TABLET | Freq: Once | ORAL | Status: AC
  Administered 2014-12-14: 650 mg via ORAL
  Filled 2014-12-14: qty 2

## 2014-12-14 NOTE — Progress Notes (Signed)
After last treatment patient did have symptoms of weakness and headache but not as severe as previous.

## 2014-12-14 NOTE — Telephone Encounter (Signed)
Notified patient as instructed, patient pleased. Discussed follow-up appointments, patient agrees  

## 2014-12-14 NOTE — Telephone Encounter (Signed)
-----   Message from Robert Bellow, MD sent at 12/14/2014 11:27 AM EST ----- Please notify the patient that the sample results showed no evidence of malignancy. Would recommend observation, warm compresses if desired. Vacuum biopsy/extraction only if symptomatic or enlarging.  ----- Message -----    From: Lab in [facility] Interface    Sent: 12/14/2014  10:48 AM      To: Robert Bellow, MD

## 2014-12-18 NOTE — Progress Notes (Signed)
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  Chief Complaint  Patient presents with  . Breast Cancer    INTERVAL HISTORY:  Patient returns to clinic today for further evaluation and consideration of cycle 5 of Herceptin only. Patient continued to have occasional dizziness after her last treatment, but it was significantly improved. She did not have any further diarrhea. She has no neurologic complaints. She denies any recent fevers or illnesses. Her appetite is improved and her weight is stable. She has no chest pain or shortness of breath. She has no urinary complaints. Patient offers no further specific complaints.  REVIEW OF SYSTEMS:   Review of Systems  Constitutional: Positive for malaise/fatigue. Negative for fever.  Respiratory: Negative.   Cardiovascular: Negative.   Gastrointestinal: Positive for nausea. Negative for abdominal pain and diarrhea.  Musculoskeletal: Negative for back pain.  Neurological: Positive for dizziness and weakness.  Psychiatric/Behavioral: The patient is nervous/anxious.     As per HPI. Otherwise, a complete review of systems is negatve.  PAST MEDICAL HISTORY: Past Medical History  Diagnosis Date  . Major depressive disorder, recurrent ([place])   . HTN (hypertension)   . Overactive bladder   . Migraines   . Gastritis   . Hypothyroidism   . OSA (obstructive sleep apnea)     untreated due to lack of insurance  . Insomnia   . GERD (gastroesophageal reflux disease)   . Pericarditis 1997  . Detached retina 1997  . Chronic headache   . Morbid obesity ([place])   . NSTEMI (non-ST  elevated myocardial infarction) ([place]) 04/16/14    Due to demand- normal Cath 04/19/14  . Breast cancer ([place])   . Bronchitis 04-2014  . Shortness of breath dyspnea     with exertion only  . Anemia     h/o  . Pericarditis 1997  . Family history of adverse reaction to anesthesia     pts dad has pseudocholinestrace deficieny    PAST SURGICAL HISTORY: Past Surgical History  Procedure Laterality Date  . Back surgery      L4-5  . Knee surgery    . Cesarean section    . Coronary angioplasty  April 2016  . Retinal detachment surgery Right 1997  . Foot surgery    . Breast lumpectomy with sentinel lymph node biopsy Left 08/13/2014    Procedure: BREAST LUMPECTOMY WITH SENTINEL LYMPH NODE BIOPSY;  Surgeon: Robert Bellow, MD;  Location: [facility] ORS;  Service: General;  Laterality: Left;  . Breast mammosite Left 08/30/2014    Procedure: MAMMOSITE BREAST;  Surgeon: Robert Bellow, MD;  Location: [facility] ORS;  Service: General;  Laterality: Left;  . Portacath placement Right 08/30/2014    Procedure: INSERTION PORT-A-CATH;  Surgeon: Robert Bellow, MD;  Location: [facility] ORS;  Service: General;  Laterality: Right;    FAMILY HISTORY Family History  Problem Relation Age of Onset  . Alcohol abuse Mother   . Thyroid disease Mother   . Hypertension Mother   . COPD Mother   . Cancer Father     liposarcoma  . Congestive Heart Failure Father   . Depression Father   . Hypertension  Father   . Cancer Maternal Uncle     Pancreatic cancer  . Heart disease Paternal Grandfather 64    MI  . Diabetes Other   . Cancer Sister     breast  . Breast cancer Sister 69       ADVANCED DIRECTIVES:    HEALTH MAINTENANCE: Social History  Substance Use Topics  . Smoking status: Never Smoker   . Smokeless tobacco: Never Used  . Alcohol Use: No     Colonoscopy:  PAP:  Bone density:  Lipid panel:  Allergies  Allergen Reactions  . Reglan [Metoclopramide] Shortness Of Breath and Other (See Comments)     Dystonic Reaction  . Paxil [Paroxetine Hcl] Other (See Comments)    Blurred vision  . Tegaderm Ag Mesh [Silver] Rash  . Ultram [Tramadol] Itching and Rash    Current Outpatient Prescriptions  Medication Sig Dispense Refill  . aspirin EC 81 MG tablet Take 81 mg by mouth daily.    . cholecalciferol (VITAMIN D) 1000 UNITS tablet Take 1,000 Units by mouth daily.    . diazepam (VALIUM) 5 MG tablet Take 1 tablet (5 mg total) by mouth every 8 (eight) hours as needed for anxiety. 30 tablet 0  . diphenhydrAMINE (BENADRYL) 25 MG tablet Take 25 mg by mouth every 6 (six) hours as needed for allergies.     . diphenoxylate-atropine (LOMOTIL) 2.5-0.025 MG per tablet Take 2 tablets by mouth 4 (four) times daily as needed for diarrhea or loose stools. 60 tablet 2  . escitalopram (LEXAPRO) 20 MG tablet Take 1 tablet (20 mg total) by mouth daily. 30 tablet 2  . HYDROcodone-acetaminophen (NORCO) 5-325 MG per tablet Take 1 tablet by mouth every 4 (four) hours as needed for moderate pain. 30 tablet 0  . levothyroxine (SYNTHROID, LEVOTHROID) 100 MCG tablet Take 1 tablet (100 mcg total) by mouth daily. 30 tablet 11  . lidocaine-prilocaine (EMLA) cream Apply 1 application topically once. Apply cream to port 1-2 hours prior to chemotherapy appointment, cover with plastic wrap. 30 g 0  . Multiple Vitamin (MULTIVITAMIN) capsule Take 1 capsule by mouth daily.    . ondansetron (ZOFRAN ODT) 4 MG disintegrating tablet Take 1 tablet (4 mg total) by mouth every 8 (eight) hours as needed for nausea or vomiting. 20 tablet 0  . potassium chloride SA (K-DUR,KLOR-CON) 20 MEQ tablet Take 1 tablet (20 mEq total) by mouth 2 (two) times daily. 60 tablet 2  . prochlorperazine (COMPAZINE) 10 MG tablet Take 1 tablet (10 mg total) by mouth every 6 (six) hours as needed for nausea or vomiting. 30 tablet 1  . ranitidine (ZANTAC) 300 MG tablet Take 1 tablet (300 mg total) by mouth at bedtime. This replaces omeprazole 30 tablet 5  . vitamin  B-12 (CYANOCOBALAMIN) 1000 MCG tablet Take 1,000 mcg by mouth daily.     No current facility-administered medications for this visit.   Facility-Administered Medications Ordered in Other Visits  Medication Dose Route Frequency Provider Last Rate Last Dose  . sodium chloride 0.9 % injection 10 mL  10 mL Intracatheter PRN Lloyd Huger, MD   10 mL at 11/30/14 1205    OBJECTIVE: Filed Vitals:   12/14/14 0936  BP: 140/98  Pulse: 79  Temp: 99.1 F (37.3 C)  Resp: 16     Body mass index is 36.85 kg/(m^2).    ECOG FS:1 - Symptomatic but completely ambulatory  General: Well-developed, well-nourished, no acute distress. Eyes: Pink conjunctiva, anicteric sclera. Breasts: Exam deferred  today. Lungs: Clear to auscultation bilaterally. Heart: Regular rate and rhythm. No rubs, murmurs, or gallops. Abdomen: Soft, nontender, nondistended. No organomegaly noted, normoactive bowel sounds. Musculoskeletal: No edema, cyanosis, or clubbing. Neuro: Alert, answering all questions appropriately. Cranial nerves grossly intact. Skin: No rashes or petechiae noted. Psych: Normal affect.   LAB RESULTS:  Lab Results  Component Value Date   NA 140 12/14/2014   K 3.5 12/14/2014   CL 102 12/14/2014   CO2 28 12/14/2014   GLUCOSE 116* 12/14/2014   BUN 12 12/14/2014   CREATININE 1.03* 12/14/2014   CALCIUM 9.0 12/14/2014   PROT 6.6 12/14/2014   ALBUMIN 4.0 12/14/2014   AST 29 12/14/2014   ALT 21 12/14/2014   ALKPHOS 90 12/14/2014   BILITOT 1.4* 12/14/2014   GFRNONAA >60 12/14/2014   GFRAA >60 12/14/2014    Lab Results  Component Value Date   WBC 4.2 12/14/2014   NEUTROABS 1.5 12/14/2014   HGB 9.1* 12/14/2014   HCT 26.6* 12/14/2014   MCV 92.8 12/14/2014   PLT 230 12/14/2014     STUDIES: Nm Cardiac Muga Rest  12/10/2014  CLINICAL DATA:  Left-sided breast cancer. Patient on cardiotoxic chemotherapy. EXAM: NUCLEAR MEDICINE CARDIAC BLOOD POOL IMAGING (MUGA) TECHNIQUE: Cardiac  multi-gated acquisition was performed at rest following intravenous injection of Tc-65mlabeled red blood cells. RADIOPHARMACEUTICALS:  20.895 mCi Tc-932mDP in-vitro labeled red blood cells IV COMPARISON:  08/31/2014 FINDINGS: The left ventricular ejection fraction equals 51.6%. Previously this measured the same. No wall motion abnormality identified. IMPRESSION: 1. Stable left ventricular ejection fraction equal to 52%. Electronically Signed   By: TaKerby Moors.D.   On: 12/10/2014 11:51   UsKoreareast Complete Uni Left Inc Axilla  12/13/2014  Ultrasound examination of the left breast in the 3:00 position 2 cm of the nipple at the site of palpable mass showed a well-defined smoothly marginated heterogeneous lesion with gentle lobulations measuring 0.6 x 1.1 x 1.4 cm. Good acoustic enhancement was noted with only focal edge effect shadowing. BI-RADS-4. The patient was amenable to FNA sampling. This was completed using 1 mL of 1% plain Xylocaine. Making use of a 22-gauge needle multiple passes through multiple areas within the lesion were documented. Slides 2 were prepared. Material was consistent with fat necrosis.    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  PLAN:    1. Breast cancer: Given the fact that patient is HER-2 positive, she will benefit from adjuvant chemotherapy. Patient has now completed MammoSite radiation. Patient's MUGA scan from December 10, 2014 was unchanged at 51.6%.  Given patient's persistent side effects, chemotherapy has been discontinued altogether and will proceed with cycle 5 which will be Herceptin only. Patient will require a total of 18 treatments of Herceptin every 3 weeks for a total of one year. Return to clinic in 3 weeks for consideration of cycle 6 of 18. She does not require an aromatase inhibitor given the fact that her tumor is ER/PR negative.   2. Diarrhea: Improved. Continue Lomotil as prescribed. 3. Dizziness:  Monitor. Consider head CT in the future if it does not improve.  Patient expressed understanding and was in agreement with this plan. She also understands that She can call clinic at any time with any questions, concerns, or complaints.   Breast cancer, female   Staging form: Breast, AJCC 7th Edition     Pathologic stage from 08/17/2014: Stage IA (T1c, N0, cM0) - Signed by TiKathlene November  Grayland Ormond, MD on 08/17/2014   Lloyd Huger, MD   12/18/2014 5:45 PM

## 2015-01-04 ENCOUNTER — Inpatient Hospital Stay: Payer: Medicaid Other | Attending: Oncology | Admitting: Oncology

## 2015-01-04 ENCOUNTER — Inpatient Hospital Stay: Payer: Medicaid Other

## 2015-01-04 VITALS — BP 156/94 | HR 60 | Temp 98.3°F | Resp 18 | Wt 229.8 lb

## 2015-01-04 DIAGNOSIS — C50912 Malignant neoplasm of unspecified site of left female breast: Secondary | ICD-10-CM | POA: Insufficient documentation

## 2015-01-04 DIAGNOSIS — Z79899 Other long term (current) drug therapy: Secondary | ICD-10-CM | POA: Insufficient documentation

## 2015-01-04 DIAGNOSIS — E669 Obesity, unspecified: Secondary | ICD-10-CM | POA: Diagnosis not present

## 2015-01-04 DIAGNOSIS — Z5112 Encounter for antineoplastic immunotherapy: Secondary | ICD-10-CM | POA: Diagnosis not present

## 2015-01-04 DIAGNOSIS — Z800 Family history of malignant neoplasm of digestive organs: Secondary | ICD-10-CM

## 2015-01-04 DIAGNOSIS — F419 Anxiety disorder, unspecified: Secondary | ICD-10-CM

## 2015-01-04 DIAGNOSIS — D649 Anemia, unspecified: Secondary | ICD-10-CM

## 2015-01-04 DIAGNOSIS — R5383 Other fatigue: Secondary | ICD-10-CM | POA: Diagnosis not present

## 2015-01-04 DIAGNOSIS — I252 Old myocardial infarction: Secondary | ICD-10-CM | POA: Insufficient documentation

## 2015-01-04 DIAGNOSIS — K219 Gastro-esophageal reflux disease without esophagitis: Secondary | ICD-10-CM | POA: Diagnosis not present

## 2015-01-04 DIAGNOSIS — Z7982 Long term (current) use of aspirin: Secondary | ICD-10-CM | POA: Diagnosis not present

## 2015-01-04 DIAGNOSIS — Z808 Family history of malignant neoplasm of other organs or systems: Secondary | ICD-10-CM

## 2015-01-04 DIAGNOSIS — F329 Major depressive disorder, single episode, unspecified: Secondary | ICD-10-CM | POA: Insufficient documentation

## 2015-01-04 DIAGNOSIS — Z8679 Personal history of other diseases of the circulatory system: Secondary | ICD-10-CM | POA: Diagnosis not present

## 2015-01-04 DIAGNOSIS — I10 Essential (primary) hypertension: Secondary | ICD-10-CM | POA: Insufficient documentation

## 2015-01-04 DIAGNOSIS — G4730 Sleep apnea, unspecified: Secondary | ICD-10-CM | POA: Diagnosis not present

## 2015-01-04 DIAGNOSIS — Z8669 Personal history of other diseases of the nervous system and sense organs: Secondary | ICD-10-CM

## 2015-01-04 DIAGNOSIS — Z803 Family history of malignant neoplasm of breast: Secondary | ICD-10-CM | POA: Diagnosis not present

## 2015-01-04 DIAGNOSIS — E039 Hypothyroidism, unspecified: Secondary | ICD-10-CM | POA: Insufficient documentation

## 2015-01-04 DIAGNOSIS — Z170 Estrogen receptor positive status [ER+]: Secondary | ICD-10-CM | POA: Insufficient documentation

## 2015-01-04 DIAGNOSIS — Z8719 Personal history of other diseases of the digestive system: Secondary | ICD-10-CM | POA: Insufficient documentation

## 2015-01-04 DIAGNOSIS — R531 Weakness: Secondary | ICD-10-CM | POA: Diagnosis not present

## 2015-01-04 DIAGNOSIS — N3281 Overactive bladder: Secondary | ICD-10-CM | POA: Insufficient documentation

## 2015-01-04 MED ORDER — SODIUM CHLORIDE 0.9 % IV SOLN
Freq: Once | INTRAVENOUS | Status: AC
Start: 2015-01-04 — End: 2015-01-04
  Administered 2015-01-04: 15:00:00 via INTRAVENOUS
  Filled 2015-01-04: qty 1000

## 2015-01-04 MED ORDER — TRASTUZUMAB CHEMO INJECTION 440 MG
6.0000 mg/kg | Freq: Once | INTRAVENOUS | Status: AC
  Administered 2015-01-04: 651 mg via INTRAVENOUS
  Filled 2015-01-04: qty 31

## 2015-01-04 MED ORDER — SODIUM CHLORIDE 0.9 % IJ SOLN
10.0000 mL | INTRAMUSCULAR | Status: DC | PRN
  Administered 2015-01-04: 10 mL
  Filled 2015-01-04: qty 10

## 2015-01-04 MED ORDER — DIPHENHYDRAMINE HCL 25 MG PO CAPS
50.0000 mg | ORAL_CAPSULE | Freq: Once | ORAL | Status: AC
  Administered 2015-01-04: 50 mg via ORAL
  Filled 2015-01-04: qty 2

## 2015-01-04 MED ORDER — ACETAMINOPHEN 325 MG PO TABS
650.0000 mg | ORAL_TABLET | Freq: Once | ORAL | Status: AC
  Administered 2015-01-04: 650 mg via ORAL
  Filled 2015-01-04: qty 2

## 2015-01-04 MED ORDER — HEPARIN SOD (PORK) LOCK FLUSH 100 UNIT/ML IV SOLN
500.0000 [IU] | Freq: Once | INTRAVENOUS | Status: AC | PRN
  Administered 2015-01-04: 500 [IU]
  Filled 2015-01-04 (×2): qty 5

## 2015-01-14 ENCOUNTER — Other Ambulatory Visit: Payer: Self-pay | Admitting: Family Medicine

## 2015-01-15 NOTE — Telephone Encounter (Signed)
I called patient, just wanted to make sure she is doing okay; she is seeing Dr. Grayland Ormond regularly; she says the dose she's on is doing okay; refills provided; I am here for her if needed

## 2015-01-16 NOTE — Progress Notes (Signed)
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  Chief Complaint  Patient presents with  . Follow-up    INTERVAL HISTORY:  Patient returns to clinic today for further evaluation and consideration of cycle 6 of Herceptin only. Patient feels significantly improved and is nearly back to her baseline. She does not have any further diarrhea. She has no neurologic complaints. She denies any recent fevers or illnesses. Her appetite is improved and her weight is stable. She has no chest pain or shortness of breath. She has no urinary complaints. Patient offers no further specific complaints.  REVIEW OF SYSTEMS:   Review of Systems  Constitutional: Positive for malaise/fatigue. Negative for fever.  Respiratory: Negative.   Cardiovascular: Negative.   Gastrointestinal: Negative for nausea, abdominal pain and diarrhea.  Musculoskeletal: Negative for back pain.  Neurological: Positive for weakness.  Psychiatric/Behavioral: The patient is nervous/anxious.     As per HPI. Otherwise, a complete review of systems is negatve.  PAST MEDICAL HISTORY: Past Medical History  Diagnosis Date  . Major depressive disorder, recurrent ([place])   . HTN (hypertension)   . Overactive bladder   . Migraines   . Gastritis   . Hypothyroidism   . OSA (obstructive sleep apnea)     untreated due to lack of insurance  . Insomnia   . GERD (gastroesophageal reflux disease)   . Pericarditis 1997  . Detached retina 1997  . Chronic headache   . Morbid obesity ([place])   . NSTEMI (non-ST elevated myocardial infarction) ([place]) 04/16/14    Due to demand-  normal Cath 04/19/14  . Breast cancer ([place])   . Bronchitis 04-2014  . Shortness of breath dyspnea     with exertion only  . Anemia     h/o  . Pericarditis 1997  . Family history of adverse reaction to anesthesia     pts dad has pseudocholinestrace deficieny    PAST SURGICAL HISTORY: Past Surgical History  Procedure Laterality Date  . Back surgery      L4-5  . Knee surgery    . Cesarean section    . Coronary angioplasty  April 2016  . Retinal detachment surgery Right 1997  . Foot surgery    . Breast lumpectomy with sentinel lymph node biopsy Left 08/13/2014    Procedure: BREAST LUMPECTOMY WITH SENTINEL LYMPH NODE BIOPSY;  Surgeon: Robert Bellow, MD;  Location: [facility] ORS;  Service: General;  Laterality: Left;  . Breast mammosite Left 08/30/2014    Procedure: MAMMOSITE BREAST;  Surgeon: Robert Bellow, MD;  Location: [facility] ORS;  Service: General;  Laterality: Left;  . Portacath placement Right 08/30/2014    Procedure: INSERTION PORT-A-CATH;  Surgeon: Robert Bellow, MD;  Location: [facility] ORS;  Service: General;  Laterality: Right;    FAMILY HISTORY Family History  Problem Relation Age of Onset  . Alcohol abuse Mother   . Thyroid disease Mother   . Hypertension Mother   . COPD Mother   . Cancer Father     liposarcoma  . Congestive Heart Failure Father   . Depression Father   . Hypertension Father   . Cancer Maternal Uncle  Pancreatic cancer  . Heart disease Paternal Grandfather 16    MI  . Diabetes Other   . Cancer Sister     breast  . Breast cancer Sister 50       ADVANCED DIRECTIVES:    HEALTH MAINTENANCE: Social History  Substance Use Topics  . Smoking status: Never Smoker   . Smokeless tobacco: Never Used  . Alcohol Use: No     Colonoscopy:  PAP:  Bone density:  Lipid panel:  Allergies  Allergen Reactions  . Reglan [Metoclopramide] Shortness Of Breath and Other (See Comments)    Dystonic Reaction  . Paxil [Paroxetine Hcl] Other (See  Comments)    Blurred vision  . Tegaderm Ag Mesh [Silver] Rash  . Ultram [Tramadol] Itching and Rash    Current Outpatient Prescriptions  Medication Sig Dispense Refill  . aspirin EC 81 MG tablet Take 81 mg by mouth daily.    . cholecalciferol (VITAMIN D) 1000 UNITS tablet Take 1,000 Units by mouth daily.    . diphenhydrAMINE (BENADRYL) 25 MG tablet Take 25 mg by mouth every 6 (six) hours as needed for allergies.     Marland Kitchen levothyroxine (SYNTHROID, LEVOTHROID) 100 MCG tablet Take 1 tablet (100 mcg total) by mouth daily. 30 tablet 11  . lidocaine-prilocaine (EMLA) cream Apply 1 application topically once. Apply cream to port 1-2 hours prior to chemotherapy appointment, cover with plastic wrap. 30 g 0  . Multiple Vitamin (MULTIVITAMIN) capsule Take 1 capsule by mouth daily.    . prochlorperazine (COMPAZINE) 10 MG tablet Take 1 tablet (10 mg total) by mouth every 6 (six) hours as needed for nausea or vomiting. 30 tablet 1  . ranitidine (ZANTAC) 300 MG tablet Take 1 tablet (300 mg total) by mouth at bedtime. This replaces omeprazole 30 tablet 5  . vitamin B-12 (CYANOCOBALAMIN) 1000 MCG tablet Take 1,000 mcg by mouth daily.    . diazepam (VALIUM) 5 MG tablet Take 1 tablet (5 mg total) by mouth every 8 (eight) hours as needed for anxiety. (Patient not taking: Reported on 01/04/2015) 30 tablet 0  . diphenoxylate-atropine (LOMOTIL) 2.5-0.025 MG per tablet Take 2 tablets by mouth 4 (four) times daily as needed for diarrhea or loose stools. (Patient not taking: Reported on 01/04/2015) 60 tablet 2  . escitalopram (LEXAPRO) 20 MG tablet TAKE ONE TABLET BY MOUTH ONCE DAILY 30 tablet 5  . HYDROcodone-acetaminophen (NORCO) 5-325 MG per tablet Take 1 tablet by mouth every 4 (four) hours as needed for moderate pain. (Patient not taking: Reported on 01/04/2015) 30 tablet 0  . ondansetron (ZOFRAN ODT) 4 MG disintegrating tablet Take 1 tablet (4 mg total) by mouth every 8 (eight) hours as needed for nausea or vomiting.  (Patient not taking: Reported on 01/04/2015) 20 tablet 0  . potassium chloride SA (K-DUR,KLOR-CON) 20 MEQ tablet Take 1 tablet (20 mEq total) by mouth 2 (two) times daily. (Patient not taking: Reported on 01/04/2015) 60 tablet 2   No current facility-administered medications for this visit.   Facility-Administered Medications Ordered in Other Visits  Medication Dose Route Frequency Provider Last Rate Last Dose  . sodium chloride 0.9 % injection 10 mL  10 mL Intracatheter PRN Lloyd Huger, MD   10 mL at 11/30/14 1205    OBJECTIVE: Filed Vitals:   01/04/15 1347 01/04/15 1354  BP:  156/94  Pulse: 60   Temp: 98.3 F (36.8 C)   Resp: 18      Body mass index is 37.11 kg/(m^2).  ECOG FS:1 - Symptomatic but completely ambulatory  General: Well-developed, well-nourished, no acute distress. Eyes: Pink conjunctiva, anicteric sclera. Breasts: Exam deferred today. Lungs: Clear to auscultation bilaterally. Heart: Regular rate and rhythm. No rubs, murmurs, or gallops. Abdomen: Soft, nontender, nondistended. No organomegaly noted, normoactive bowel sounds. Musculoskeletal: No edema, cyanosis, or clubbing. Neuro: Alert, answering all questions appropriately. Cranial nerves grossly intact. Skin: No rashes or petechiae noted. Psych: Normal affect.   LAB RESULTS:  Lab Results  Component Value Date   NA 140 12/14/2014   K 3.5 12/14/2014   CL 102 12/14/2014   CO2 28 12/14/2014   GLUCOSE 116* 12/14/2014   BUN 12 12/14/2014   CREATININE 1.03* 12/14/2014   CALCIUM 9.0 12/14/2014   PROT 6.6 12/14/2014   ALBUMIN 4.0 12/14/2014   AST 29 12/14/2014   ALT 21 12/14/2014   ALKPHOS 90 12/14/2014   BILITOT 1.4* 12/14/2014   GFRNONAA >60 12/14/2014   GFRAA >60 12/14/2014    Lab Results  Component Value Date   WBC 4.2 12/14/2014   NEUTROABS 1.5 12/14/2014   HGB 9.1* 12/14/2014   HCT 26.6* 12/14/2014   MCV 92.8 12/14/2014   PLT 230 12/14/2014     STUDIES: No results  found.  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  PLAN:    1. Breast cancer: Given the fact that patient is HER-2 positive, she will benefit from adjuvant chemotherapy. Patient has now completed MammoSite radiation. Patient's MUGA scan from December 10, 2014 was unchanged at 51.6%.  Given patient's persistent side effects, chemotherapy was been discontinued altogether after 3 cycles. Proceed with cycle 6 of 18 of Herceptin only today. Return to clinic in 3 weeks for consideration of cycle 7. She does not require an aromatase inhibitor given the fact that her tumor is ER/PR negative.   2. Diarrhea: Resolved. Continue Lomotil as prescribed. 3. Dizziness: Resolved. Consider head CT in the future if it does not improve. 4. Anemia: Mild, monitor. 5. Elevated bilirubin: Mild, monitor.  Patient expressed understanding and was in agreement with this plan. She also understands that She can call clinic at any time with any questions, concerns, or complaints.   Breast cancer, female   Staging form: Breast, AJCC 7th Edition     Pathologic stage from 08/17/2014: Stage IA (T1c, N0, cM0) - Signed by Lloyd Huger, MD on 08/17/2014   Lloyd Huger, MD   01/16/2015 9:08 AM

## 2015-01-24 ENCOUNTER — Other Ambulatory Visit: Payer: Self-pay

## 2015-01-24 ENCOUNTER — Emergency Department: Payer: Medicaid Other

## 2015-01-24 ENCOUNTER — Encounter: Payer: Self-pay | Admitting: *Deleted

## 2015-01-24 ENCOUNTER — Inpatient Hospital Stay
Admission: EM | Admit: 2015-01-24 | Discharge: 2015-01-25 | DRG: 311 | Disposition: A | Discharge status: Home / Self Care | Payer: Medicaid Other | Attending: Internal Medicine | Admitting: Internal Medicine

## 2015-01-24 DIAGNOSIS — I214 Non-ST elevation (NSTEMI) myocardial infarction: Secondary | ICD-10-CM | POA: Diagnosis present

## 2015-01-24 DIAGNOSIS — K219 Gastro-esophageal reflux disease without esophagitis: Secondary | ICD-10-CM | POA: Diagnosis present

## 2015-01-24 DIAGNOSIS — Z6836 Body mass index (BMI) 36.0-36.9, adult: Secondary | ICD-10-CM

## 2015-01-24 DIAGNOSIS — Z7982 Long term (current) use of aspirin: Secondary | ICD-10-CM

## 2015-01-24 DIAGNOSIS — E039 Hypothyroidism, unspecified: Secondary | ICD-10-CM | POA: Diagnosis present

## 2015-01-24 DIAGNOSIS — I10 Essential (primary) hypertension: Secondary | ICD-10-CM | POA: Diagnosis present

## 2015-01-24 DIAGNOSIS — R7989 Other specified abnormal findings of blood chemistry: Secondary | ICD-10-CM

## 2015-01-24 DIAGNOSIS — F329 Major depressive disorder, single episode, unspecified: Secondary | ICD-10-CM | POA: Diagnosis present

## 2015-01-24 DIAGNOSIS — G4733 Obstructive sleep apnea (adult) (pediatric): Secondary | ICD-10-CM | POA: Diagnosis present

## 2015-01-24 DIAGNOSIS — F339 Major depressive disorder, recurrent, unspecified: Secondary | ICD-10-CM | POA: Diagnosis present

## 2015-01-24 DIAGNOSIS — Z888 Allergy status to other drugs, medicaments and biological substances status: Secondary | ICD-10-CM

## 2015-01-24 DIAGNOSIS — I249 Acute ischemic heart disease, unspecified: Secondary | ICD-10-CM

## 2015-01-24 DIAGNOSIS — C50919 Malignant neoplasm of unspecified site of unspecified female breast: Secondary | ICD-10-CM | POA: Diagnosis present

## 2015-01-24 DIAGNOSIS — I252 Old myocardial infarction: Secondary | ICD-10-CM

## 2015-01-24 DIAGNOSIS — Z886 Allergy status to analgesic agent status: Secondary | ICD-10-CM

## 2015-01-24 DIAGNOSIS — I209 Angina pectoris, unspecified: Principal | ICD-10-CM | POA: Diagnosis present

## 2015-01-24 DIAGNOSIS — R778 Other specified abnormalities of plasma proteins: Secondary | ICD-10-CM | POA: Diagnosis present

## 2015-01-24 DIAGNOSIS — C50512 Malignant neoplasm of lower-outer quadrant of left female breast: Secondary | ICD-10-CM | POA: Diagnosis present

## 2015-01-24 LAB — BASIC METABOLIC PANEL
ANION GAP: 8 (ref 5–15)
BUN: 20 mg/dL (ref 6–20)
CO2: 28 mmol/L (ref 22–32)
Calcium: 9.3 mg/dL (ref 8.9–10.3)
Chloride: 105 mmol/L (ref 101–111)
Creatinine, Ser: 1.21 mg/dL — ABNORMAL HIGH (ref 0.44–1.00)
GFR calc Af Amer: 58 mL/min — ABNORMAL LOW (ref >60)
GFR calc non Af Amer: 50 mL/min — ABNORMAL LOW (ref >60)
GLUCOSE: 93 mg/dL (ref 65–99)
POTASSIUM: 3.6 mmol/L (ref 3.5–5.1)
Sodium: 141 mmol/L (ref 135–145)

## 2015-01-24 LAB — CBC
HEMATOCRIT: 31.3 % — AB (ref 35.0–47.0)
Hemoglobin: 10.4 g/dL — ABNORMAL LOW (ref 12.0–16.0)
MCH: 30.5 pg (ref 26.0–34.0)
MCHC: 33.2 g/dL (ref 32.0–36.0)
MCV: 91.8 fL (ref 80.0–100.0)
PLATELETS: 225 10*3/uL (ref 150–440)
RBC: 3.41 MIL/uL — AB (ref 3.80–5.20)
RDW: 14.3 % (ref 11.5–14.5)
WBC: 6.6 10*3/uL (ref 3.6–11.0)

## 2015-01-24 LAB — TROPONIN I
Troponin I: 0.05 ng/mL — ABNORMAL HIGH (ref <0.031)
Troponin I: 0.68 ng/mL — ABNORMAL HIGH (ref <0.031)

## 2015-01-24 MED ORDER — PENTAFLUOROPROP-TETRAFLUOROETH EX AERO
INHALATION_SPRAY | CUTANEOUS | Status: AC
  Filled 2015-01-24: qty 30

## 2015-01-24 MED ORDER — ACETAMINOPHEN 500 MG PO TABS
1000.0000 mg | ORAL_TABLET | Freq: Once | ORAL | Status: AC
  Administered 2015-01-24: 1000 mg via ORAL
  Filled 2015-01-24: qty 2

## 2015-01-24 MED ORDER — ASPIRIN 81 MG PO CHEW
324.0000 mg | CHEWABLE_TABLET | Freq: Once | ORAL | Status: AC
Start: 2015-01-24 — End: 2015-01-24
  Administered 2015-01-24: 324 mg via ORAL
  Filled 2015-01-24: qty 4

## 2015-01-24 MED ORDER — CLONIDINE HCL 0.1 MG PO TABS
0.2000 mg | ORAL_TABLET | Freq: Once | ORAL | Status: AC
  Administered 2015-01-24: 0.2 mg via ORAL
  Filled 2015-01-24: qty 2

## 2015-01-24 NOTE — ED Notes (Signed)
When I was attempting to run ECG on patient, the machine kept transferring blank sheets with patient's name and information on them.  Patient had only 1 ECG ran.

## 2015-01-24 NOTE — ED Notes (Signed)
I forgot to add it will show 5 total ECGs.

## 2015-01-24 NOTE — ED Provider Notes (Signed)
Time Seen: Approximately ----------------------------------------- 6:44 PM on 01/24/2015 -----------------------------------------    I have reviewed the triage notes  Chief Complaint: Chest Pain   History of Present Illness: Robin Howard is a 55 y.o. female who is currently under treatment for breast cancer. Patient states her last chemotherapy was performed 6 weeks ago. She also takes periodically oral chemotherapy and states the last dosage was 3 weeks ago. She has a port which is functionable. Patient states that she had some chest pain that started approximately 3 PM and lasted 30-45 minutes she describes it as a burning pain that radiated to both parts of her chest and shoulders. She denies any jaw pain though states she developed a headache afterwards. She does have a history of hypertension and arrives slightly hypertensive here in emergency department. She denies any fever, chills, productive cough. She states she had some mild nausea but no vomiting. She had some mild shortness of breath which the pain and all other associated symptoms is not currently present. She denies any calf tenderness or swelling. The patient has a significant history of a non-ST wave myocardial infarction that was identified in April 2016. She had a heart catheterization withstents and was told that she has patent coronaries. She receives periodic MUGA scans due to her chemotherapy which have remained within normal limits.   Past Medical History  Diagnosis Date  . Major depressive disorder, recurrent ([place])   . HTN (hypertension)   . Overactive bladder   . Migraines   . Gastritis   . Hypothyroidism   . OSA (obstructive sleep apnea)     untreated due to lack of insurance  . Insomnia   . GERD (gastroesophageal reflux disease)   . Pericarditis 1997  . Detached retina 1997  . Chronic headache   . Morbid obesity ([place])   . NSTEMI (non-ST elevated myocardial infarction) ([place]) 04/16/14    Due to demand-  normal Cath 04/19/14  . Breast cancer ([place])   . Bronchitis 04-2014  . Shortness of breath dyspnea     with exertion only  . Anemia     h/o  . Pericarditis 1997  . Family history of adverse reaction to anesthesia     pts dad has pseudocholinestrace deficieny    Patient Active Problem List   Diagnosis Date Noted  . Breast lump on left side at 3 o'clock position 12/13/2014  . Colitis 09/23/2014  . Breast cancer, female ([place]) 08/10/2014  . Migraine 07/15/2014  . Fatigue 07/15/2014  . Major depressive disorder, recurrent ([place])   . HTN (hypertension)   . Overactive bladder   . Hypothyroidism   . OSA (obstructive sleep apnea)   . GERD (gastroesophageal reflux disease)   . Chronic headache     Past Surgical History  Procedure Laterality Date  . Back surgery      L4-5  . Knee surgery    . Cesarean section    . Coronary angioplasty  April 2016  . Retinal detachment surgery Right 1997  . Foot surgery    . Breast lumpectomy with sentinel lymph node biopsy Left 08/13/2014    Procedure: BREAST LUMPECTOMY WITH SENTINEL LYMPH NODE BIOPSY;  Surgeon: Robert Bellow, MD;  Location: [facility] ORS;  Service: General;  Laterality: Left;  . Breast mammosite Left 08/30/2014    Procedure: MAMMOSITE BREAST;  Surgeon: Robert Bellow, MD;  Location: [facility] ORS;  Service: General;  Laterality: Left;  . Portacath placement Right 08/30/2014    Procedure: INSERTION PORT-A-CATH;  Surgeon: Robert Bellow, MD;  Location: [facility] OR[facility];  Service: General;  Laterality: Right;    Past Surgical History  Procedure Laterality Date  . Back surgery      L4-5  . Knee surgery    . Cesarean section    . Coronary angioplasty  April 2016  . Retinal detachment surgery Right 1997  . Foot surgery    . Breast lumpectomy with sentinel lymph node biopsy Left 08/13/2014    Procedure: BREAST LUMPECTOMY WITH SENTINEL LYMPH NODE BIOPSY;  Surgeon: Robert Bellow, MD;  Location: [facility];  Service: General;  Laterality: Left;   . Breast mammosite Left 08/30/2014    Procedure: MAMMOSITE BREAST;  Surgeon: Robert Bellow, MD;  Location: [facility];  Service: General;  Laterality: Left;  . Portacath placement Right 08/30/2014    Procedure: INSERTION PORT-A-CATH;  Surgeon: Robert Bellow, MD;  Location: [facility];  Service: General;  Laterality: Right;    Current Outpatient Rx  Name  Route  Sig  Dispense  Refill  . aspirin EC 81 MG tablet   Oral   Take 81 mg by mouth daily.         . cholecalciferol (VITAMIN D) 1000 UNITS tablet   Oral   Take 1,000 Units by mouth daily.         . diazepam (VALIUM) 5 MG tablet   Oral   Take 1 tablet (5 mg total) by mouth every 8 (eight) hours as needed for anxiety. Patient not taking: Reported on 01/04/2015   30 tablet   0   . diphenhydrAMINE (BENADRYL) 25 MG tablet   Oral   Take 25 mg by mouth every 6 (six) hours as needed for allergies.          . diphenoxylate-atropine (LOMOTIL) 2.5-0.025 MG per tablet   Oral   Take 2 tablets by mouth 4 (four) times daily as needed for diarrhea or loose stools. Patient not taking: Reported on 01/04/2015   60 tablet   2   . escitalopram (LEXAPRO) 20 MG tablet      TAKE ONE TABLET BY MOUTH ONCE DAILY   30 tablet   5   . HYDROcodone-acetaminophen (NORCO) 5-325 MG per tablet   Oral   Take 1 tablet by mouth every 4 (four) hours as needed for moderate pain. Patient not taking: Reported on 01/04/2015   30 tablet   0   . levothyroxine (SYNTHROID, LEVOTHROID) 100 MCG tablet   Oral   Take 1 tablet (100 mcg total) by mouth daily.   30 tablet   11   . lidocaine-prilocaine (EMLA) cream   Topical   Apply 1 application topically once. Apply cream to port 1-2 hours prior to chemotherapy appointment, cover with plastic wrap.   30 g   0   . Multiple Vitamin (MULTIVITAMIN) capsule   Oral   Take 1 capsule by mouth daily.         . ondansetron (ZOFRAN ODT) 4 MG disintegrating tablet   Oral   Take 1 tablet (4 mg total) by  mouth every 8 (eight) hours as needed for nausea or vomiting. Patient not taking: Reported on 01/04/2015   20 tablet   0   . potassium chloride SA (K-DUR,KLOR-CON) 20 MEQ tablet   Oral   Take 1 tablet (20 mEq total) by mouth 2 (two) times daily. Patient not taking: Reported on 01/04/2015   60 tablet   2   . prochlorperazine (COMPAZINE) 10 MG tablet  Oral   Take 1 tablet (10 mg total) by mouth every 6 (six) hours as needed for nausea or vomiting.   30 tablet   1   . ranitidine (ZANTAC) 300 MG tablet   Oral   Take 1 tablet (300 mg total) by mouth at bedtime. This replaces omeprazole   30 tablet   5   . vitamin B-12 (CYANOCOBALAMIN) 1000 MCG tablet   Oral   Take 1,000 mcg by mouth daily.           Allergies:  Reglan; Paxil; Tegaderm ag mesh; and Ultram  Family History: Family History  Problem Relation Age of Onset  . Alcohol abuse Mother   . Thyroid disease Mother   . Hypertension Mother   . COPD Mother   . Cancer Father     liposarcoma  . Congestive Heart Failure Father   . Depression Father   . Hypertension Father   . Cancer Maternal Uncle     Pancreatic cancer  . Heart disease Paternal Grandfather 27    MI  . Diabetes Other   . Cancer Sister     breast  . Breast cancer Sister 24    Social History: Social History  Substance Use Topics  . Smoking status: Never Smoker   . Smokeless tobacco: Never Used  . Alcohol Use: No     Review of Systems:   10 point review of systems was performed and was otherwise negative:  Constitutional: No fever Eyes: No visual disturbances ENT: No sore throat, ear pain Cardiac: No current chest pain Respiratory: No shortness of breath, wheezing, or stridor Abdomen: No abdominal pain, no vomiting, No diarrhea Endocrine: No weight loss, No night sweats Extremities: No peripheral edema, cyanosis Skin: No rashes, easy bruising Neurologic: No focal weakness, trouble with speech or swollowing Urologic: No dysuria,  Hematuria, or urinary frequency Headache is bilateral and points mainly to the temporal areas and states she's had similar headaches in the past. She denies any photophobia, blurred vision, neck pain or fever. She denies any trauma  Physical Exam:  ED Triage Vitals  Enc Vitals Group     BP 01/24/15 1625 157/100 mmHg     Pulse Rate 01/24/15 1625 70     Resp 01/24/15 1625 18     Temp 01/24/15 1625 98.5 F (36.9 C)     Temp Source 01/24/15 1625 Oral     SpO2 01/24/15 1625 100 %     Weight 01/24/15 1625 230 lb (104.327 kg)     Height 01/24/15 1625 5\' 6"  (1.676 m)     Head Cir --      Peak Flow --      Pain Score 01/24/15 1625 3     Pain Loc --      Pain Edu? --      Excl. in [place]? --     General: Awake , Alert , and Oriented times 3; GCS 15 Head: Normal cephalic , atraumatic Eyes: Pupils equal , round, reactive to light Nose/Throat: No nasal drainage, patent upper airway without erythema or exudate.  Neck: Supple, Full range of motion, No anterior adenopathy or palpable thyroid masses Lungs: Clear to ascultation without wheezes , rhonchi, or rales Heart: Regular rate, regular rhythm without murmurs , gallops , or rubs Abdomen: Soft, non tender without rebound, guarding , or rigidity; bowel sounds positive and symmetric in all 4 quadrants. No organomegaly .        Extremities: 2 plus symmetric pulses. No edema,  clubbing or cyanosis Neurologic: normal ambulation, Motor symmetric without deficits, sensory intact Skin: warm, dry, no rashes   Labs:   All laboratory work was reviewed including any pertinent negatives or positives listed below:  Labs Reviewed  BASIC METABOLIC PANEL - Abnormal; Notable for the following:    Creatinine, Ser 1.21 (*)    GFR calc non Af Amer 50 (*)    GFR calc Af Amer 58 (*)    All other components within normal limits  TROPONIN I - Abnormal; Notable for the following:    Troponin I 0.05 (*)    All other components within normal limits  CBC -  Abnormal; Notable for the following:    RBC 3.41 (*)    Hemoglobin 10.4 (*)    HCT 31.3 (*)    All other components within normal limits   reviewed the patient's laboratory work shows significant elevation of the troponin 0.68  EKG: ED ECG REPORT I, Daymon Larsen, the attending physician, personally viewed and interpreted this ECG.  Date: 01/24/2015 EKG Time: 1619 Rate: 71 Rhythm: normal sinus rhythm usual PACs QRS Axis: normal Intervals: normal ST/T Wave abnormalities: Nonspecific ST-T wave abnormality Conduction Disutrbances: none Narrative Interpretation: unremarkable No acute ischemic changes   Radiology:  EXAM: CHEST 2 VIEW  COMPARISON: Portable chest x-ray of August 30, 2014  FINDINGS: The lungs are adequately inflated and clear. The heart and pulmonary vascularity are normal. The mediastinum is normal in width. There is no pleural effusion. The Port-A-Cath appliance tip projects at the junction of the middle and distal thirds of the SVC. The bony structures are unremarkable.  IMPRESSION: There is no active cardiopulmonary disease.   * I personally reviewed the radiologic studies     ED Course:  Patient remained hemodynamically stable and her blood pressure decreased with clonidine and she was given aspirin. She describes no chest pain during the course of her evaluation here in emergency department. We convince her to stay for a repeat troponin which was significantly elevated comparison to her first initial levels. Patient had a previous myocardial infarction with no obvious tenable lesions per heart catheterization. This appears to be a repeat episode. There was no cardiac arrhythmias with continuous cardiac monitoring.   Assessment:  Acute coronary syndrome    Plan:  Inpatient management            Daymon Larsen, MD 01/24/15 2112

## 2015-01-24 NOTE — H&P (Signed)
[facility] [place] NAME: Robin Howard    MR#:  [id number]  DATE OF BIRTH:  July 30, 1960  DATE OF ADMISSION:  01/24/2015  PRIMARY CARE PHYSICIAN: Enid Derry, MD   REQUESTING/REFERRING PHYSICIAN: Marcelene Butte, MD  CHIEF COMPLAINT:   Chief Complaint  Patient presents with  . Chest Pain    HISTORY OF PRESENT ILLNESS:  Robin Howard  is a 55 y.o. female who presents with an episode of chest pain. Patient states that she was at work today, not spitting any strenuous activity, when she developed central chest pain. She describes this pain as initially a burning sensation, which then radiated to her bilateral jaw and the back of her neck. She states that the pain lasted for about 40-45 minutes, and that there were no associated alleviating or aggravating factors. Patient had a similar episode of chest pain early last year, and was worked up including cardiac catheterization. Of note, the patient did have positive troponin at that time. However, her cardiac catheterization states that, per the discharge summary on chart review, states that she was not found to have any blockages, and that her non-STEMI was felt to be due to demand ischemia. On evaluation in the ED today she once again has a positive troponin at 0.05 and then 0.68. Hospitalists were called for admission.  PAST MEDICAL HISTORY:   Past Medical History  Diagnosis Date  . Major depressive disorder, recurrent ([place])   . HTN (hypertension)   . Overactive bladder   . Migraines   . Gastritis   . Hypothyroidism   . OSA (obstructive sleep apnea)     untreated due to lack of insurance  . Insomnia   . GERD (gastroesophageal reflux disease)   . Pericarditis 1997  . Detached retina 1997  . Chronic headache   . Morbid obesity ([place])   . NSTEMI (non-ST elevated myocardial infarction) ([place]) 04/16/14    Due to demand- normal Cath 04/19/14  . Breast cancer ([place])   . Bronchitis 04-2014  . Shortness of  breath dyspnea     with exertion only  . Anemia     h/o  . Pericarditis 1997  . Family history of adverse reaction to anesthesia     pts dad has pseudocholinestrace deficieny    PAST SURGICAL HISTORY:   Past Surgical History  Procedure Laterality Date  . Back surgery      L4-5  . Knee surgery    . Cesarean section    . Coronary angioplasty  April 2016  . Retinal detachment surgery Right 1997  . Foot surgery    . Breast lumpectomy with sentinel lymph node biopsy Left 08/13/2014    Procedure: BREAST LUMPECTOMY WITH SENTINEL LYMPH NODE BIOPSY;  Surgeon: Robert Bellow, MD;  Location: [facility]S;  Service: General;  Laterality: Left;  . Breast mammosite Left 08/30/2014    Procedure: MAMMOSITE BREAST;  Surgeon: Robert Bellow, MD;  Location: [facility];  Service: General;  Laterality: Left;  . Portacath placement Right 08/30/2014    Procedure: INSERTION PORT-A-CATH;  Surgeon: Robert Bellow, MD;  Location: [facility];  Service: General;  Laterality: Right;    SOCIAL HISTORY:   Social History  Substance Use Topics  . Smoking status: Never Smoker   . Smokeless tobacco: Never Used  . Alcohol Use: No    FAMILY HISTORY:   Family History  Problem Relation Age of Onset  . Alcohol abuse Mother   . Thyroid  disease Mother   . Hypertension Mother   . COPD Mother   . Cancer Father     liposarcoma  . Congestive Heart Failure Father   . Depression Father   . Hypertension Father   . Cancer Maternal Uncle     Pancreatic cancer  . Heart disease Paternal Grandfather 58    MI  . Diabetes Other   . Cancer Sister     breast  . Breast cancer Sister 79    DRUG ALLERGIES:   Allergies  Allergen Reactions  . Reglan [Metoclopramide] Shortness Of Breath and Other (See Comments)    This medication caused a dystonic reaction.    Marland Kitchen Paxil [Paroxetine Hcl] Other (See Comments)    Reaction:  Blurred vision  . Tegaderm Ag Mesh [Silver] Rash  . Ultram [Tramadol] Itching and Rash     MEDICATIONS AT HOME:   Prior to Admission medications   Medication Sig Start Date End Date Taking? Authorizing Provider  acetaminophen (TYLENOL) 500 MG tablet Take 1,000 mg by mouth every 6 (six) hours as needed for mild pain or headache.   Yes Historical Provider, MD  aspirin EC 81 MG tablet Take 81 mg by mouth daily.   Yes Historical Provider, MD  cholecalciferol (VITAMIN D) 1000 UNITS tablet Take 1,000 Units by mouth daily.   Yes Historical Provider, MD  diazepam (VALIUM) 5 MG tablet Take 1 tablet (5 mg total) by mouth every 8 (eight) hours as needed for anxiety. 11/09/14 11/09/15 Yes Earleen Newport, MD  diphenhydrAMINE (BENADRYL) 25 MG tablet Take 25 mg by mouth every 6 (six) hours as needed for allergies.    Yes Historical Provider, MD  escitalopram (LEXAPRO) 20 MG tablet Take 20 mg by mouth daily.   Yes Historical Provider, MD  HYDROcodone-acetaminophen (NORCO) 5-325 MG per tablet Take 1 tablet by mouth every 4 (four) hours as needed for moderate pain. 09/24/14  Yes Vaughan Basta, MD  levothyroxine (SYNTHROID, LEVOTHROID) 100 MCG tablet Take 1 tablet (100 mcg total) by mouth daily. 08/04/14  Yes Arnetha Courser, MD  lidocaine-prilocaine (EMLA) cream Apply 1 application topically as needed (prior to accessing port).   Yes Historical Provider, MD  Multiple Vitamin (MULTIVITAMIN WITH MINERALS) TABS tablet Take 1 tablet by mouth daily.   Yes Historical Provider, MD  naproxen sodium (ANAPROX) 220 MG tablet Take 440 mg by mouth 2 (two) times daily as needed (for pain).   Yes Historical Provider, MD  ranitidine (ZANTAC) 150 MG tablet Take 300 mg by mouth at bedtime.   Yes Historical Provider, MD  vitamin B-12 (CYANOCOBALAMIN) 1000 MCG tablet Take 1,000 mcg by mouth daily.   Yes Historical Provider, MD  diphenoxylate-atropine (LOMOTIL) 2.5-0.025 MG per tablet Take 2 tablets by mouth 4 (four) times daily as needed for diarrhea or loose stools. Patient not taking: Reported on 01/04/2015  09/21/14   Lloyd Huger, MD  ondansetron (ZOFRAN ODT) 4 MG disintegrating tablet Take 1 tablet (4 mg total) by mouth every 8 (eight) hours as needed for nausea or vomiting. Patient not taking: Reported on 01/04/2015 09/24/14   Vaughan Basta, MD  potassium chloride SA (K-DUR,KLOR-CON) 20 MEQ tablet Take 1 tablet (20 mEq total) by mouth 2 (two) times daily. Patient not taking: Reported on 01/04/2015 11/02/14   Lloyd Huger, MD  prochlorperazine (COMPAZINE) 10 MG tablet Take 1 tablet (10 mg total) by mouth every 6 (six) hours as needed for nausea or vomiting. Patient not taking: Reported on 01/24/2015 09/14/14  Lloyd Huger, MD    REVIEW OF SYSTEMS:  Review of Systems  Constitutional: Negative for fever, chills, weight loss and malaise/fatigue.  HENT: Negative for ear pain, hearing loss and tinnitus.   Eyes: Negative for blurred vision, double vision, pain and redness.  Respiratory: Negative for cough, hemoptysis and shortness of breath.   Cardiovascular: Positive for chest pain. Negative for palpitations, orthopnea and leg swelling.  Gastrointestinal: Negative for nausea, vomiting, abdominal pain, diarrhea and constipation.  Genitourinary: Negative for dysuria, frequency and hematuria.  Musculoskeletal: Negative for back pain, joint pain and neck pain.  Skin:       No acne, rash, or lesions  Neurological: Negative for dizziness, tremors, focal weakness and weakness.  Endo/Heme/Allergies: Negative for polydipsia. Does not bruise/bleed easily.  Psychiatric/Behavioral: Negative for depression. The patient is not nervous/anxious and does not have insomnia.      VITAL SIGNS:   Filed Vitals:   01/24/15 1915 01/24/15 1930 01/24/15 2030 01/24/15 2300  BP:  137/96 128/84 143/83  Pulse: 65 69 64 63  Temp:      TempSrc:      Resp: 17 16 17 18   Height:      Weight:      SpO2: 100% 100% 99% 100%   Wt Readings from Last 3 Encounters:  01/24/15 104.327 kg (230 lb)   01/04/15 104.25 kg (229 lb 13.3 oz)  12/14/14 103.5 kg (228 lb 2.8 oz)    PHYSICAL EXAMINATION:  Physical Exam  Vitals reviewed. Constitutional: She is oriented to person, place, and time. She appears well-developed and well-nourished. No distress.  HENT:  Head: Normocephalic and atraumatic.  Mouth/Throat: Oropharynx is clear and moist.  Eyes: Conjunctivae and EOM are normal. Pupils are equal, round, and reactive to light. No scleral icterus.  Neck: Normal range of motion. Neck supple. No JVD present. No thyromegaly present.  Cardiovascular: Normal rate, regular rhythm and intact distal pulses.  Exam reveals no gallop and no friction rub.   No murmur heard. Respiratory: Effort normal and breath sounds normal. No respiratory distress. She has no wheezes. She has no rales.  GI: Soft. Bowel sounds are normal. She exhibits no distension. There is no tenderness.  Musculoskeletal: Normal range of motion. She exhibits no edema.  No arthritis, no gout  Lymphadenopathy:    She has no cervical adenopathy.  Neurological: She is alert and oriented to person, place, and time. No cranial nerve deficit.  No dysarthria, no aphasia  Skin: Skin is warm and dry. No rash noted. No erythema.  Psychiatric: She has a normal mood and affect. Her behavior is normal. Judgment and thought content normal.    LABORATORY PANEL:   CBC  Recent Labs Lab 01/24/15 1658  WBC 6.6  HGB 10.4*  HCT 31.3*  PLT 225   ------------------------------------------------------------------------------------------------------------------  Chemistries   Recent Labs Lab 01/24/15 1658  NA 141  K 3.6  CL 105  CO2 28  GLUCOSE 93  BUN 20  CREATININE 1.21*  CALCIUM 9.3   ------------------------------------------------------------------------------------------------------------------  Cardiac Enzymes  Recent Labs Lab 01/24/15 2002  TROPONINI 0.68*    ------------------------------------------------------------------------------------------------------------------  RADIOLOGY:  Dg Chest 2 View  01/24/2015  CLINICAL DATA:  Onset of a burning chest sensation and began 1 lower ago with pain radiating down both arms ; history of MI in April 2016, history of pericarditis and breast malignancy EXAM: CHEST  2 VIEW COMPARISON:  Portable chest x-ray of August 30, 2014 FINDINGS: The lungs are adequately inflated and clear. The  heart and pulmonary vascularity are normal. The mediastinum is normal in width. There is no pleural effusion. The Port-A-Cath appliance tip projects at the junction of the middle and distal thirds of the SVC. The bony structures are unremarkable. IMPRESSION: There is no active cardiopulmonary disease. Electronically Signed   By: Taeveon Keesling  [place] M.D.   On: 01/24/2015 16:48    EKG:   Orders placed or performed during the hospital encounter of 01/24/15  . ED EKG within 10 minutes  . ED EKG within 10 minutes    IMPRESSION AND PLAN:  Principal Problem:   Angina pectoris ([place]) - not classically typical cardiac symptoms, however she does have a positive troponin. She has had this in the past, with a reportedly clear cardiac cath, on chart review I could not find the official catheterization report, only her discharge summary which relays the information of a cardiac catheterization in re-stated summary form. Her chest pain has resolved at this time, see below for treatment of elevated troponin. Active Problems:   Elevated troponin - will not start heparin at this time given her above history. However we'll continue to trend her enzymes, and if they continue to rise then we will start heparin. Cardiology consult ordered. Echocardiogram also ordered.   HTN (hypertension) - at goal here, monitor closely and treat when necessary if greater than 160/100   Breast cancer, female [facility]) - patient relates she is scheduled for Herceptin treatment,  consult oncology for the same.   Major depressive disorder, recurrent ([place]) - continue home meds   Hypothyroidism - home dose thyroid replacement   GERD (gastroesophageal reflux disease) - home dose H2 blocker  All the records are reviewed and case discussed with ED provider. Management plans discussed with the patient and/or family.  DVT PROPHYLAXIS: SubQ lovenox  GI PROPHYLAXIS: H2 blocker  ADMISSION STATUS: Observation  CODE STATUS: Full Advance Directive Documentation        Most Recent Value   Type of Advance Directive  Healthcare Power of Attorney   Pre-existing out of facility DNR order (yellow form or pink MOST form)     "MOST" Form in Place?        TOTAL TIME TAKING CARE OF THIS PATIENT: 40 minutes.    Mindee Robledo FIELDING 01/24/2015, 11:53 PM  [facility]  Office  [phone]  CC: Primary care physician; Enid Derry, MD

## 2015-01-25 ENCOUNTER — Inpatient Hospital Stay: Payer: Medicaid Other

## 2015-01-25 ENCOUNTER — Encounter: Payer: Self-pay | Admitting: Emergency Medicine

## 2015-01-25 ENCOUNTER — Observation Stay
Admit: 2015-01-25 | Discharge: 2015-01-25 | Disposition: A | Discharge status: Home / Self Care | Payer: Medicaid Other | Attending: Internal Medicine | Admitting: Internal Medicine

## 2015-01-25 ENCOUNTER — Ambulatory Visit: Admission: RE | Admit: 2015-01-25 | Payer: Medicaid Other | Source: Ambulatory Visit

## 2015-01-25 ENCOUNTER — Inpatient Hospital Stay: Admit: 2015-01-25 | Payer: Medicaid Other

## 2015-01-25 ENCOUNTER — Inpatient Hospital Stay: Payer: Medicaid Other | Admitting: Oncology

## 2015-01-25 ENCOUNTER — Other Ambulatory Visit: Payer: Self-pay | Admitting: Oncology

## 2015-01-25 ENCOUNTER — Telehealth: Payer: Self-pay | Admitting: Family Medicine

## 2015-01-25 DIAGNOSIS — E039 Hypothyroidism, unspecified: Secondary | ICD-10-CM | POA: Diagnosis present

## 2015-01-25 DIAGNOSIS — Z8669 Personal history of other diseases of the nervous system and sense organs: Secondary | ICD-10-CM

## 2015-01-25 DIAGNOSIS — I252 Old myocardial infarction: Secondary | ICD-10-CM | POA: Diagnosis not present

## 2015-01-25 DIAGNOSIS — I249 Acute ischemic heart disease, unspecified: Secondary | ICD-10-CM | POA: Diagnosis not present

## 2015-01-25 DIAGNOSIS — K219 Gastro-esophageal reflux disease without esophagitis: Secondary | ICD-10-CM

## 2015-01-25 DIAGNOSIS — Z886 Allergy status to analgesic agent status: Secondary | ICD-10-CM | POA: Diagnosis not present

## 2015-01-25 DIAGNOSIS — R079 Chest pain, unspecified: Secondary | ICD-10-CM | POA: Diagnosis not present

## 2015-01-25 DIAGNOSIS — I213 ST elevation (STEMI) myocardial infarction of unspecified site: Secondary | ICD-10-CM | POA: Diagnosis not present

## 2015-01-25 DIAGNOSIS — G4700 Insomnia, unspecified: Secondary | ICD-10-CM

## 2015-01-25 DIAGNOSIS — I10 Essential (primary) hypertension: Secondary | ICD-10-CM

## 2015-01-25 DIAGNOSIS — Z888 Allergy status to other drugs, medicaments and biological substances status: Secondary | ICD-10-CM | POA: Diagnosis not present

## 2015-01-25 DIAGNOSIS — C50912 Malignant neoplasm of unspecified site of left female breast: Secondary | ICD-10-CM

## 2015-01-25 DIAGNOSIS — F329 Major depressive disorder, single episode, unspecified: Secondary | ICD-10-CM | POA: Diagnosis present

## 2015-01-25 DIAGNOSIS — Z6836 Body mass index (BMI) 36.0-36.9, adult: Secondary | ICD-10-CM | POA: Diagnosis not present

## 2015-01-25 DIAGNOSIS — I214 Non-ST elevation (NSTEMI) myocardial infarction: Secondary | ICD-10-CM | POA: Diagnosis present

## 2015-01-25 DIAGNOSIS — I209 Angina pectoris, unspecified: Secondary | ICD-10-CM | POA: Diagnosis present

## 2015-01-25 DIAGNOSIS — Z171 Estrogen receptor negative status [ER-]: Secondary | ICD-10-CM

## 2015-01-25 DIAGNOSIS — R0602 Shortness of breath: Secondary | ICD-10-CM

## 2015-01-25 DIAGNOSIS — Z7982 Long term (current) use of aspirin: Secondary | ICD-10-CM | POA: Diagnosis not present

## 2015-01-25 DIAGNOSIS — C50919 Malignant neoplasm of unspecified site of unspecified female breast: Secondary | ICD-10-CM | POA: Diagnosis present

## 2015-01-25 DIAGNOSIS — E669 Obesity, unspecified: Secondary | ICD-10-CM

## 2015-01-25 DIAGNOSIS — I319 Disease of pericardium, unspecified: Secondary | ICD-10-CM

## 2015-01-25 DIAGNOSIS — G4730 Sleep apnea, unspecified: Secondary | ICD-10-CM

## 2015-01-25 DIAGNOSIS — G4733 Obstructive sleep apnea (adult) (pediatric): Secondary | ICD-10-CM | POA: Diagnosis present

## 2015-01-25 DIAGNOSIS — N3281 Overactive bladder: Secondary | ICD-10-CM

## 2015-01-25 DIAGNOSIS — Z803 Family history of malignant neoplasm of breast: Secondary | ICD-10-CM

## 2015-01-25 LAB — CBC
HCT: 27.7 % — ABNORMAL LOW (ref 35.0–47.0)
HEMATOCRIT: 27.1 % — AB (ref 35.0–47.0)
HEMOGLOBIN: 9.2 g/dL — AB (ref 12.0–16.0)
Hemoglobin: 9.1 g/dL — ABNORMAL LOW (ref 12.0–16.0)
MCH: 30.7 pg (ref 26.0–34.0)
MCH: 30.4 pg (ref 26.0–34.0)
MCHC: 33.3 g/dL (ref 32.0–36.0)
MCHC: 33.5 g/dL (ref 32.0–36.0)
MCV: 91.4 fL (ref 80.0–100.0)
MCV: 91.6 fL (ref 80.0–100.0)
PLATELETS: 168 10*3/uL (ref 150–440)
PLATELETS: 188 10*3/uL (ref 150–440)
RBC: 2.96 MIL/uL — ABNORMAL LOW (ref 3.80–5.20)
RBC: 3.03 MIL/uL — AB (ref 3.80–5.20)
RDW: 14.3 % (ref 11.5–14.5)
RDW: 14.6 % — ABNORMAL HIGH (ref 11.5–14.5)
WBC: 5.9 10*3/uL (ref 3.6–11.0)
WBC: 6.2 10*3/uL (ref 3.6–11.0)

## 2015-01-25 LAB — PROTIME-INR
INR: 1.04
Prothrombin Time: 13.8 seconds (ref 11.4–15.0)

## 2015-01-25 LAB — TROPONIN I: Troponin I: 1.02 ng/mL — ABNORMAL HIGH (ref <0.031)

## 2015-01-25 LAB — BASIC METABOLIC PANEL
Anion gap: 0 — ABNORMAL LOW (ref 5–15)
BUN: 24 mg/dL — ABNORMAL HIGH (ref 6–20)
CHLORIDE: 111 mmol/L (ref 101–111)
CO2: 28 mmol/L (ref 22–32)
CREATININE: 1 mg/dL (ref 0.44–1.00)
Calcium: 8.6 mg/dL — ABNORMAL LOW (ref 8.9–10.3)
GFR calc non Af Amer: >60 mL/min (ref >60)
GLUCOSE: 100 mg/dL — AB (ref 65–99)
Potassium: 3.5 mmol/L (ref 3.5–5.1)
Sodium: 139 mmol/L (ref 135–145)

## 2015-01-25 LAB — CREATININE, SERUM
Creatinine, Ser: 1.09 mg/dL — ABNORMAL HIGH (ref 0.44–1.00)
GFR, EST NON AFRICAN AMERICAN: 57 mL/min — AB (ref >60)

## 2015-01-25 LAB — APTT: APTT: 31 s (ref 24–36)

## 2015-01-25 LAB — HEPARIN LEVEL (UNFRACTIONATED): HEPARIN UNFRACTIONATED: 0.52 [IU]/mL (ref 0.30–0.70)

## 2015-01-25 MED ORDER — ESCITALOPRAM OXALATE 10 MG PO TABS
20.0000 mg | ORAL_TABLET | Freq: Every day | ORAL | Status: DC
  Administered 2015-01-25: 20 mg via ORAL
  Filled 2015-01-25: qty 2

## 2015-01-25 MED ORDER — DIAZEPAM 5 MG PO TABS: 5.0000 mg | ORAL_TABLET | Freq: Three times a day (TID) | ORAL | Status: DC | PRN

## 2015-01-25 MED ORDER — HEPARIN BOLUS VIA INFUSION
4000.0000 [IU] | Freq: Once | INTRAVENOUS | Status: AC
  Administered 2015-01-25: 4000 [IU] via INTRAVENOUS
  Filled 2015-01-25: qty 4000

## 2015-01-25 MED ORDER — ONDANSETRON HCL 4 MG PO TABS: 4.0000 mg | ORAL_TABLET | Freq: Four times a day (QID) | ORAL | Status: DC | PRN

## 2015-01-25 MED ORDER — ONDANSETRON HCL 4 MG/2ML IJ SOLN: 4.0000 mg | Freq: Four times a day (QID) | INTRAMUSCULAR | Status: DC | PRN

## 2015-01-25 MED ORDER — ISOSORBIDE MONONITRATE ER 30 MG PO TB24
30.0000 mg | ORAL_TABLET | Freq: Every day | ORAL | Status: DC
  Administered 2015-01-25: 30 mg via ORAL
  Filled 2015-01-25: qty 1

## 2015-01-25 MED ORDER — CLOPIDOGREL BISULFATE 75 MG PO TABS
300.0000 mg | ORAL_TABLET | Freq: Once | ORAL | Status: AC
  Administered 2015-01-25: 300 mg via ORAL
  Filled 2015-01-25: qty 4

## 2015-01-25 MED ORDER — ASPIRIN EC 81 MG PO TBEC
81.0000 mg | DELAYED_RELEASE_TABLET | Freq: Every day | ORAL | Status: DC
  Administered 2015-01-25: 81 mg via ORAL
  Filled 2015-01-25: qty 1

## 2015-01-25 MED ORDER — ENOXAPARIN SODIUM 40 MG/0.4ML ~~LOC~~ SOLN: 40.0000 mg | SUBCUTANEOUS | Status: DC

## 2015-01-25 MED ORDER — HYDROCODONE-ACETAMINOPHEN 5-325 MG PO TABS: 1.0000 | ORAL_TABLET | Freq: Four times a day (QID) | ORAL | Status: DC | PRN

## 2015-01-25 MED ORDER — HEPARIN SOD (PORK) LOCK FLUSH 100 UNIT/ML IV SOLN
500.0000 [IU] | Freq: Once | INTRAVENOUS | Status: AC
  Administered 2015-01-25: 500 [IU] via INTRAVENOUS
  Filled 2015-01-25: qty 5

## 2015-01-25 MED ORDER — HYDROCODONE-ACETAMINOPHEN 5-325 MG PO TABS: 1.0000 | ORAL_TABLET | ORAL | Status: DC | PRN

## 2015-01-25 MED ORDER — ISOSORBIDE MONONITRATE ER 30 MG PO TB24: 30.0000 mg | ORAL_TABLET | Freq: Every day | ORAL | Status: DC

## 2015-01-25 MED ORDER — ACETAMINOPHEN 325 MG PO TABS
650.0000 mg | ORAL_TABLET | Freq: Four times a day (QID) | ORAL | Status: DC | PRN
  Administered 2015-01-25 (×2): 650 mg via ORAL
  Filled 2015-01-25 (×2): qty 2

## 2015-01-25 MED ORDER — HEPARIN (PORCINE) IN NACL 100-0.45 UNIT/ML-% IJ SOLN
1100.0000 [IU]/h | INTRAMUSCULAR | Status: DC
  Administered 2015-01-25: 1100 [IU]/h via INTRAVENOUS
  Filled 2015-01-25: qty 250

## 2015-01-25 MED ORDER — FAMOTIDINE 20 MG PO TABS
40.0000 mg | ORAL_TABLET | Freq: Every day | ORAL | Status: DC
  Administered 2015-01-25: 40 mg via ORAL
  Filled 2015-01-25: qty 2

## 2015-01-25 MED ORDER — LEVOTHYROXINE SODIUM 100 MCG PO TABS
100.0000 ug | ORAL_TABLET | Freq: Every day | ORAL | Status: DC
  Filled 2015-01-25: qty 1

## 2015-01-25 MED ORDER — ACETAMINOPHEN 650 MG RE SUPP: 650.0000 mg | Freq: Four times a day (QID) | RECTAL | Status: DC | PRN

## 2015-01-25 MED ORDER — CLOPIDOGREL BISULFATE 75 MG PO TABS: 75.0000 mg | ORAL_TABLET | Freq: Every day | ORAL | Status: DC

## 2015-01-25 MED ORDER — DIPHENHYDRAMINE HCL 25 MG PO TABS
25.0000 mg | ORAL_TABLET | Freq: Four times a day (QID) | ORAL | Status: DC | PRN
  Administered 2015-01-25: 25 mg via ORAL
  Filled 2015-01-25 (×3): qty 1

## 2015-01-25 MED ORDER — SODIUM CHLORIDE 0.9 % IJ SOLN
3.0000 mL | Freq: Two times a day (BID) | INTRAMUSCULAR | Status: DC
  Administered 2015-01-25: 3 mL via INTRAVENOUS

## 2015-01-25 NOTE — Progress Notes (Signed)
*  PRELIMINARY RESULTS* Echocardiogram 2D Echocardiogram has been performed.  Robin Howard 01/25/2015, 10:43 AM

## 2015-01-25 NOTE — Care Management (Signed)
Spoke with attending   Attending states that patient's diagnosis is nstemi which qualifies patient for inpatient.

## 2015-01-25 NOTE — Progress Notes (Signed)
Robin Howard is a 55 y.o. female  [id number]  Primary Cardiologist: Neoma Laming Reason for Consultation: Chest pain  HPI: This is a 55 year old white female with a past medical history of normal coronaries on 04/19/2014, presented with another episode of chest pain very typical angina. Troponins were mildly elevated like they were in 04/19/2014 also.   Review of Systems: No further chest pain no PND orthopnea or leg swelling   Past Medical History  Diagnosis Date  . Major depressive disorder, recurrent ([place])   . HTN (hypertension)   . Overactive bladder   . Migraines   . Gastritis   . Hypothyroidism   . OSA (obstructive sleep apnea)     untreated due to lack of insurance  . Insomnia   . GERD (gastroesophageal reflux disease)   . Pericarditis 1997  . Detached retina 1997  . Chronic headache   . Morbid obesity ([place])   . NSTEMI (non-ST elevated myocardial infarction) ([place]) 04/16/14    Due to demand- normal Cath 04/19/14  . Breast cancer ([place])   . Bronchitis 04-2014  . Shortness of breath dyspnea     with exertion only  . Anemia     h/o  . Pericarditis 1997  . Family history of adverse reaction to anesthesia     pts dad has pseudocholinestrace deficieny    Medications Prior to Admission  Medication Sig Dispense Refill  . acetaminophen (TYLENOL) 500 MG tablet Take 1,000 mg by mouth every 6 (six) hours as needed for mild pain or headache.    Marland Kitchen aspirin EC 81 MG tablet Take 81 mg by mouth daily.    . cholecalciferol (VITAMIN D) 1000 UNITS tablet Take 1,000 Units by mouth daily.    . diazepam (VALIUM) 5 MG tablet Take 1 tablet (5 mg total) by mouth every 8 (eight) hours as needed for anxiety. 30 tablet 0  . diphenhydrAMINE (BENADRYL) 25 MG tablet Take 25 mg by mouth every 6 (six) hours as needed for allergies.     Marland Kitchen escitalopram (LEXAPRO) 20 MG tablet Take 20 mg by mouth daily.    Marland Kitchen HYDROcodone-acetaminophen (NORCO) 5-325 MG per tablet Take 1 tablet by mouth every 4 (four)  hours as needed for moderate pain. 30 tablet 0  . levothyroxine (SYNTHROID, LEVOTHROID) 100 MCG tablet Take 1 tablet (100 mcg total) by mouth daily. 30 tablet 11  . lidocaine-prilocaine (EMLA) cream Apply 1 application topically as needed (prior to accessing port).    . Multiple Vitamin (MULTIVITAMIN WITH MINERALS) TABS tablet Take 1 tablet by mouth daily.    . naproxen sodium (ANAPROX) 220 MG tablet Take 440 mg by mouth 2 (two) times daily as needed (for pain).    . ranitidine (ZANTAC) 150 MG tablet Take 300 mg by mouth at bedtime.    . vitamin B-12 (CYANOCOBALAMIN) 1000 MCG tablet Take 1,000 mcg by mouth daily.    . diphenoxylate-atropine (LOMOTIL) 2.5-0.025 MG per tablet Take 2 tablets by mouth 4 (four) times daily as needed for diarrhea or loose stools. (Patient not taking: Reported on 01/04/2015) 60 tablet 2  . ondansetron (ZOFRAN ODT) 4 MG disintegrating tablet Take 1 tablet (4 mg total) by mouth every 8 (eight) hours as needed for nausea or vomiting. (Patient not taking: Reported on 01/04/2015) 20 tablet 0  . potassium chloride SA (K-DUR,KLOR-CON) 20 MEQ tablet Take 1 tablet (20 mEq total) by mouth 2 (two) times daily. (Patient not taking: Reported on 01/04/2015) 60 tablet 2  . prochlorperazine (  COMPAZINE) 10 MG tablet Take 1 tablet (10 mg total) by mouth every 6 (six) hours as needed for nausea or vomiting. (Patient not taking: Reported on 01/24/2015) 30 tablet 1     . aspirin EC  81 mg Oral Daily  . clopidogrel  300 mg Oral Once  . [START ON 01/26/2015] clopidogrel  75 mg Oral Daily  . escitalopram  20 mg Oral Daily  . famotidine  40 mg Oral QHS  . isosorbide mononitrate  30 mg Oral Daily  . levothyroxine  100 mcg Oral QAC breakfast  . sodium chloride  3 mL Intravenous Q12H    Infusions: . heparin 1,100 Units/hr (01/25/15 0538)    Allergies  Allergen Reactions  . Reglan [Metoclopramide] Shortness Of Breath and Other (See Comments)    This medication caused a dystonic reaction.     Marland Kitchen Paxil [Paroxetine Hcl] Other (See Comments)    Reaction:  Blurred vision  . Tegaderm Ag Mesh [Silver] Rash  . Ultram [Tramadol] Itching and Rash    Social History   Social History  . Marital Status: Divorced    Spouse Name: N/A  . Number of Children: N/A  . Years of Education: N/A   Occupational History  . Not on file.   Social History Main Topics  . Smoking status: Never Smoker   . Smokeless tobacco: Never Used  . Alcohol Use: No  . Drug Use: No  . Sexual Activity: Not Currently   Other Topics Concern  . Not on file   Social History Narrative    Family History  Problem Relation Age of Onset  . Alcohol abuse Mother   . Thyroid disease Mother   . Hypertension Mother   . COPD Mother   . Cancer Father     liposarcoma  . Congestive Heart Failure Father   . Depression Father   . Hypertension Father   . Cancer Maternal Uncle     Pancreatic cancer  . Heart disease Paternal Grandfather 51    MI  . Diabetes Other   . Cancer Sister     breast  . Breast cancer Sister 50    PHYSICAL EXAM: Filed Vitals:   01/25/15 0537 01/25/15 1146  BP: 114/66 136/92  Pulse: 57 52  Temp: 97.6 F (36.4 C) 98 F (36.7 C)  Resp: 20 18     Intake/Output Summary (Last 24 hours) at 01/25/15 1308 Last data filed at 01/25/15 0537  Gross per 24 hour  Intake      0 ml  Output    300 ml  Net   -300 ml    General:  Well appearing. No respiratory difficulty HEENT: normal Neck: supple. no JVD. Carotids 2+ bilat; no bruits. No lymphadenopathy or thryomegaly appreciated. Cor: PMI nondisplaced. Regular rate & rhythm. No rubs, gallops or murmurs. Lungs: clear Abdomen: soft, nontender, nondistended. No hepatosplenomegaly. No bruits or masses. Good bowel sounds. Extremities: no cyanosis, clubbing, rash, edema Neuro: alert & oriented x 3, cranial nerves grossly intact. moves all 4 extremities w/o difficulty. Affect pleasant.  ECG: Sinus rhythm with no acute EKG changes  Results  for orders placed or performed during the hospital encounter of 01/24/15 (from the past 24 hour(s))  Basic metabolic panel     Status: Abnormal   Collection Time: 01/24/15  4:58 PM  Result Value Ref Range   Sodium 141 135 - 145 mmol/L   Potassium 3.6 3.5 - 5.1 mmol/L   Chloride 105 101 - 111 mmol/L  CO2 28 22 - 32 mmol/L   Glucose, Bld 93 65 - 99 mg/dL   BUN 20 6 - 20 mg/dL   Creatinine, Ser 1.21 (H) 0.44 - 1.00 mg/dL   Calcium 9.3 8.9 - 10.3 mg/dL   GFR calc non Af Amer 50 (L) >60 mL/min   GFR calc Af Amer 58 (L) >60 mL/min   Anion gap 8 5 - 15  Troponin I     Status: Abnormal   Collection Time: 01/24/15  4:58 PM  Result Value Ref Range   Troponin I 0.05 (H) <0.031 ng/mL  CBC     Status: Abnormal   Collection Time: 01/24/15  4:58 PM  Result Value Ref Range   WBC 6.6 3.6 - 11.0 K/uL   RBC 3.41 (L) 3.80 - 5.20 MIL/uL   Hemoglobin 10.4 (L) 12.0 - 16.0 g/dL   HCT 31.3 (L) 35.0 - 47.0 %   MCV 91.8 80.0 - 100.0 fL   MCH 30.5 26.0 - 34.0 pg   MCHC 33.2 32.0 - 36.0 g/dL   RDW 14.3 11.5 - 14.5 %   Platelets 225 150 - 440 K/uL  Troponin I     Status: Abnormal   Collection Time: 01/24/15  8:02 PM  Result Value Ref Range   Troponin I 0.68 (H) <0.031 ng/mL  CBC     Status: Abnormal   Collection Time: 01/25/15 12:56 AM  Result Value Ref Range   WBC 6.2 3.6 - 11.0 K/uL   RBC 2.96 (L) 3.80 - 5.20 MIL/uL   Hemoglobin 9.1 (L) 12.0 - 16.0 g/dL   HCT 27.1 (L) 35.0 - 47.0 %   MCV 91.6 80.0 - 100.0 fL   MCH 30.7 26.0 - 34.0 pg   MCHC 33.5 32.0 - 36.0 g/dL   RDW 14.3 11.5 - 14.5 %   Platelets 168 150 - 440 K/uL  Creatinine, serum     Status: Abnormal   Collection Time: 01/25/15 12:56 AM  Result Value Ref Range   Creatinine, Ser 1.09 (H) 0.44 - 1.00 mg/dL   GFR calc non Af Amer 57 (L) >60 mL/min   GFR calc Af Amer >60 >60 mL/min  Troponin I     Status: Abnormal   Collection Time: 01/25/15 12:56 AM  Result Value Ref Range   Troponin I 1.02 (H) <0.031 ng/mL  Basic metabolic panel      Status: Abnormal   Collection Time: 01/25/15  2:30 AM  Result Value Ref Range   Sodium 139 135 - 145 mmol/L   Potassium 3.5 3.5 - 5.1 mmol/L   Chloride 111 101 - 111 mmol/L   CO2 28 22 - 32 mmol/L   Glucose, Bld 100 (H) 65 - 99 mg/dL   BUN 24 (H) 6 - 20 mg/dL   Creatinine, Ser 1.00 0.44 - 1.00 mg/dL   Calcium 8.6 (L) 8.9 - 10.3 mg/dL   GFR calc non Af Amer >60 >60 mL/min   GFR calc Af Amer >60 >60 mL/min   Anion gap 0 (L) 5 - 15  CBC     Status: Abnormal   Collection Time: 01/25/15  2:30 AM  Result Value Ref Range   WBC 5.9 3.6 - 11.0 K/uL   RBC 3.03 (L) 3.80 - 5.20 MIL/uL   Hemoglobin 9.2 (L) 12.0 - 16.0 g/dL   HCT 27.7 (L) 35.0 - 47.0 %   MCV 91.4 80.0 - 100.0 fL   MCH 30.4 26.0 - 34.0 pg   MCHC 33.3 32.0 -  36.0 g/dL   RDW 14.6 (H) 11.5 - 14.5 %   Platelets 188 150 - 440 K/uL  APTT     Status: None   Collection Time: 01/25/15  2:30 AM  Result Value Ref Range   aPTT 31 24 - 36 seconds  Protime-INR     Status: None   Collection Time: 01/25/15  2:30 AM  Result Value Ref Range   Prothrombin Time 13.8 11.4 - 15.0 seconds   INR 1.04   Heparin level (unfractionated)     Status: None   Collection Time: 01/25/15 11:54 AM  Result Value Ref Range   Heparin Unfractionated 0.52 0.30 - 0.70 IU/mL   Dg Chest 2 View  01/24/2015  CLINICAL DATA:  Onset of a burning chest sensation and began 1 lower ago with pain radiating down both arms ; history of MI in April 2016, history of pericarditis and breast malignancy EXAM: CHEST  2 VIEW COMPARISON:  Portable chest x-ray of August 30, 2014 FINDINGS: The lungs are adequately inflated and clear. The heart and pulmonary vascularity are normal. The mediastinum is normal in width. There is no pleural effusion. The Port-A-Cath appliance tip projects at the junction of the middle and distal thirds of the SVC. The bony structures are unremarkable. IMPRESSION: There is no active cardiopulmonary disease. Electronically Signed   By: David  Martinique M.D.   On:  01/24/2015 16:48     ASSESSMENT AND PLAN: Chest pain with mildly elevated troponin 0.05 0.068 and 1.02 with unremarkable EKG and normal coronaries on 04/19/2014. Echocardiogram showed borderline left radical systolic function with mild septal hypokinesis, due to abnormalities in the septum and no further chest pain will do stress test in the office tomorrow at 8:30 AM. Will schedule the patient for stress tests tomorrow morning in the office. Patient will go from here and get instructions from our office. Also we will place the patient on Plavix aspirin and isosorbide 30 mg with the first dose today.  Orey Moure A

## 2015-01-25 NOTE — Discharge Instructions (Signed)
Follow with Dr. Laurelyn Sickle office tomorrow am for stress test.

## 2015-01-25 NOTE — Telephone Encounter (Signed)
I talked with patient; she's been in hospital (I read hospital notes); she gets stress test tomorrow; I am here for her

## 2015-01-25 NOTE — Progress Notes (Signed)
Dr. Anselm Jungling notified that patient was able to walk 2 laps around nursing station with no chest pain or shortness of breath. Instructed to flush and de-access port at discharge this afternoon.

## 2015-01-25 NOTE — Progress Notes (Signed)
ANTICOAGULATION CONSULT NOTE - [place] for heparin drip Indication: ACS /STEMI  Allergies  Allergen Reactions  . Reglan [Metoclopramide] Shortness Of Breath and Other (See Comments)    This medication caused a dystonic reaction.    Marland Kitchen Paxil [Paroxetine Hcl] Other (See Comments)    Reaction:  Blurred vision  . Tegaderm Ag Mesh [Silver] Rash  . Ultram [Tramadol] Itching and Rash    Patient Measurements: Height: 5\' 6"  (167.6 cm) Weight: 227 lb 14.4 oz (103.375 kg) IBW/kg (Calculated) : 59.3 Heparin Dosing Weight: 83kg  Vital Signs: Temp: 98 F (36.7 C) (01/10 1146) Temp Source: Oral (01/10 1146) BP: 136/92 mmHg (01/10 1146) Pulse Rate: 52 (01/10 1146)  Labs:  Recent Labs  01/24/15 1658 01/24/15 2002 01/25/15 0056 01/25/15 0230 01/25/15 1154  HGB 10.4*  --  9.1* 9.2*  --   HCT 31.3*  --  27.1* 27.7*  --   PLT 225  --  168 188  --   APTT  --   --   --  31  --   LABPROT  --   --   --  13.8  --   INR  --   --   --  1.04  --   HEPARINUNFRC  --   --   --   --  0.52  CREATININE 1.21*  --  1.09* 1.00  --   TROPONINI 0.05* 0.68* 1.02*  --   --     Estimated Creatinine Clearance: 79 mL/min (by C-G formula based on Cr of 1).   Medical History: Past Medical History  Diagnosis Date  . Major depressive disorder, recurrent ([place])   . HTN (hypertension)   . Overactive bladder   . Migraines   . Gastritis   . Hypothyroidism   . OSA (obstructive sleep apnea)     untreated due to lack of insurance  . Insomnia   . GERD (gastroesophageal reflux disease)   . Pericarditis 1997  . Detached retina 1997  . Chronic headache   . Morbid obesity ([place])   . NSTEMI (non-ST elevated myocardial infarction) ([place]) 04/16/14    Due to demand- normal Cath 04/19/14  . Breast cancer ([place])   . Bronchitis 04-2014  . Shortness of breath dyspnea     with exertion only  . Anemia     h/o  . Pericarditis 1997  . Family history of adverse reaction to anesthesia     pts  dad has pseudocholinestrace deficieny    Medications:    Assessment: Pharmacy consulted to dose heparin drip for 55 yo female being treated for ACS. Patient currently on heparin at 1100 units/hr.    Goal of Therapy:  Heparin level 0.3-0.7 units/ml Monitor platelets by anticoagulation protocol: Yes   Plan:  Will continue patient on heparin drip at current rate of 1100 units/hr. Will obtain follow-up anti-Xa level at 1800.    Pharmacy will continue to monitor and adjust per consult.   Kylan Liberati L 01/25/2015,12:55 PM

## 2015-01-25 NOTE — Progress Notes (Signed)
Patient given discharge teaching and paperwork regarding medications, diet, follow-up appointments and activity. Patient understanding verbalized. No complaints at this time. . IV and telemetry discontinued prior to leaving. Skin assessment as previously charted and vitals are stable; on room air. Patient being discharged to home. Family present during discharge teaching.  Prescriptions sent to [facility], [facility]. Home meds returned.

## 2015-01-25 NOTE — Progress Notes (Signed)
ANTICOAGULATION CONSULT NOTE - Initial Consult  Pharmacy Consult for heparin drip Indication: ACS /STEMI  Allergies  Allergen Reactions  . Reglan [Metoclopramide] Shortness Of Breath and Other (See Comments)    This medication caused a dystonic reaction.    Marland Kitchen Paxil [Paroxetine Hcl] Other (See Comments)    Reaction:  Blurred vision  . Tegaderm Ag Mesh [Silver] Rash  . Ultram [Tramadol] Itching and Rash    Patient Measurements: Height: 5\' 6"  (167.6 cm) Weight: 227 lb 14.4 oz (103.375 kg) IBW/kg (Calculated) : 59.3 Heparin Dosing Weight: 83kg  Vital Signs: Temp: 98.3 F (36.8 C) (01/10 0115) Temp Source: Oral (01/10 0115) BP: 133/83 mmHg (01/10 0115) Pulse Rate: 65 (01/10 0115)  Labs:  Recent Labs  01/24/15 1658 01/24/15 2002 01/25/15 0056 01/25/15 0230  HGB 10.4*  --  9.1* 9.2*  HCT 31.3*  --  27.1* 27.7*  PLT 225  --  168 188  APTT  --   --   --  31  LABPROT  --   --   --  13.8  INR  --   --   --  1.04  CREATININE 1.21*  --  1.09* 1.00  TROPONINI 0.05* 0.68* 1.02*  --     Estimated Creatinine Clearance: 79 mL/min (by C-G formula based on Cr of 1).   Medical History: Past Medical History  Diagnosis Date  . Major depressive disorder, recurrent ([place])   . HTN (hypertension)   . Overactive bladder   . Migraines   . Gastritis   . Hypothyroidism   . OSA (obstructive sleep apnea)     untreated due to lack of insurance  . Insomnia   . GERD (gastroesophageal reflux disease)   . Pericarditis 1997  . Detached retina 1997  . Chronic headache   . Morbid obesity ([place])   . NSTEMI (non-ST elevated myocardial infarction) ([place]) 04/16/14    Due to demand- normal Cath 04/19/14  . Breast cancer ([place])   . Bronchitis 04-2014  . Shortness of breath dyspnea     with exertion only  . Anemia     h/o  . Pericarditis 1997  . Family history of adverse reaction to anesthesia     pts dad has pseudocholinestrace deficieny    Medications:    Assessment: Hgb 9.1  plt   188 INR 1.04  aPTT 31  Goal of Therapy:  Heparin level 0.3-0.7 units/ml Monitor platelets by anticoagulation protocol: Yes   Plan:  4000 unit bolus and initial rate of 1100 units/hr. First heparin level 6 hours after start of infusion.  Fines Kimberlin S 01/25/2015,5:15 AM

## 2015-01-25 NOTE — Progress Notes (Signed)
Per Dr. Humphrey Rolls, stop heparin drip and start plavix and imdur. Pt ok to discharge today and follow-up outpatient for stress test tomorrow. Dr. Humphrey Rolls spoke with Dr. Anselm Jungling.

## 2015-01-25 NOTE — Consult Note (Signed)
Robin Howard  [phone] [phone]  ID: Rhina Brackett OB: 21-Oct-1960  MR#: [id number]  [id number]  Patient Care Team: Arnetha Courser, MD as PCP - General (Family Medicine) Rico Junker, RN as Registered Nurse Theodore Demark, RN as Registered Nurse Seeplaputhur [name], MD (General Surgery)  CHIEF COMPLAINT:  Chief Complaint  Patient presents with  . Chest Pain    INTERVAL HISTORY: Patient is a 55 year old female who presents to the emergency room with chest pain for approximately 45 minutes. Subsequent workup revealed a rising troponin level consistent with acute MI.  Currently, she feels well and is back to her baseline. She has no further chest pain. Patient offers no specific complaints today.  REVIEW OF SYSTEMS:   Review of Systems  Constitutional: Negative.  Negative for fever and malaise/fatigue.  Respiratory: Negative.  Negative for shortness of breath.   Cardiovascular: Positive for chest pain.  Gastrointestinal: Negative.   Musculoskeletal: Negative.   Neurological: Negative.  Negative for weakness.    As per HPI. Otherwise, a complete review of systems is negatve.  PAST MEDICAL HISTORY: Past Medical History  Diagnosis Date  . Major depressive disorder, recurrent ([place])   . HTN (hypertension)   . Overactive bladder   . Migraines   . Gastritis   . Hypothyroidism   . OSA (obstructive sleep apnea)     untreated due to lack of insurance  . Insomnia   . GERD (gastroesophageal reflux disease)   . Pericarditis 1997  . Detached retina 1997  . Chronic headache   . Morbid obesity ([place])   . NSTEMI (non-ST elevated myocardial infarction) ([place]) 04/16/14    Due to demand- normal Cath 04/19/14  . Breast cancer ([place])   . Bronchitis 04-2014  . Shortness of breath dyspnea     with exertion only  . Anemia     h/o  . Pericarditis 1997  . Family history of adverse reaction to anesthesia     pts dad has pseudocholinestrace  deficieny    PAST SURGICAL HISTORY: Past Surgical History  Procedure Laterality Date  . Back surgery      L4-5  . Knee surgery    . Cesarean section    . Coronary angioplasty  April 2016  . Retinal detachment surgery Right 1997  . Foot surgery    . Breast lumpectomy with sentinel lymph node biopsy Left 08/13/2014    Procedure: BREAST LUMPECTOMY WITH SENTINEL LYMPH NODE BIOPSY;  Surgeon: Robert Bellow, MD;  Location: [facility] ORS;  Service: General;  Laterality: Left;  . Breast mammosite Left 08/30/2014    Procedure: MAMMOSITE BREAST;  Surgeon: Robert Bellow, MD;  Location: [facility] ORS;  Service: General;  Laterality: Left;  . Portacath placement Right 08/30/2014    Procedure: INSERTION PORT-A-CATH;  Surgeon: Robert Bellow, MD;  Location: [facility] ORS;  Service: General;  Laterality: Right;    FAMILY HISTORY Family History  Problem Relation Age of Onset  . Alcohol abuse Mother   . Thyroid disease Mother   . Hypertension Mother   . COPD Mother   . Cancer Father     liposarcoma  . Congestive Heart Failure Father   . Depression Father   . Hypertension Father   . Cancer Maternal Uncle     Pancreatic cancer  . Heart disease Paternal Grandfather 46    MI  . Diabetes Other   . Cancer Sister     breast  . Breast cancer Sister 2  ADVANCED DIRECTIVES:    HEALTH MAINTENANCE: Social History  Substance Use Topics  . Smoking status: Never Smoker   . Smokeless tobacco: Never Used  . Alcohol Use: No     Colonoscopy:  PAP:  Bone density:  Lipid panel:  Allergies  Allergen Reactions  . Reglan [Metoclopramide] Shortness Of Breath and Other (See Comments)    This medication caused a dystonic reaction.    Marland Kitchen Paxil [Paroxetine Hcl] Other (See Comments)    Reaction:  Blurred vision  . Tegaderm Ag Mesh [Silver] Rash  . Ultram [Tramadol] Itching and Rash    Current Facility-Administered Medications  Medication Dose Route Frequency Provider Last Rate Last Dose  .  acetaminophen (TYLENOL) tablet 650 mg  650 mg Oral Q6H PRN Lance Coon, MD   650 mg at 01/25/15 [id number]   Or  . acetaminophen (TYLENOL) suppository 650 mg  650 mg Rectal Q6H PRN Lance Coon, MD      . aspirin EC tablet 81 mg  81 mg Oral Daily Lance Coon, MD   81 mg at 01/25/15 [id number]  . [START ON 01/26/2015] clopidogrel (PLAVIX) tablet 75 mg  75 mg Oral Daily Dionisio David, MD      . diazepam (VALIUM) tablet 5 mg  5 mg Oral Q8H PRN Lance Coon, MD      . diphenhydrAMINE (BENADRYL) tablet 25 mg  25 mg Oral Q6H PRN Lance Coon, MD   25 mg at 01/25/15 0156  . escitalopram (LEXAPRO) tablet 20 mg  20 mg Oral Daily Lance Coon, MD   20 mg at 01/25/15 [id number]  . famotidine (PEPCID) tablet 40 mg  40 mg Oral QHS Lance Coon, MD   40 mg at 01/25/15 0155  . HYDROcodone-acetaminophen (NORCO/VICODIN) 5-325 MG per tablet 1 tablet  1 tablet Oral Q4H PRN Lance Coon, MD      . isosorbide mononitrate (IMDUR) 24 hr tablet 30 mg  30 mg Oral Daily Dionisio David, MD   30 mg at 01/25/15 1322  . levothyroxine (SYNTHROID, LEVOTHROID) tablet 100 mcg  100 mcg Oral QAC breakfast Lance Coon, MD   100 mcg at 01/25/15 0825  . ondansetron (ZOFRAN) tablet 4 mg  4 mg Oral Q6H PRN Lance Coon, MD       Or  . ondansetron [facility]) injection 4 mg  4 mg Intravenous Q6H PRN Lance Coon, MD      . sodium chloride 0.9 % injection 3 mL  3 mL Intravenous Q12H Lance Coon, MD   3 mL at 01/25/15 0159   Facility-Administered Medications Ordered in Other Encounters  Medication Dose Route Frequency Provider Last Rate Last Dose  . sodium chloride 0.9 % injection 10 mL  10 mL Intracatheter PRN Lloyd Huger, MD   10 mL at 11/30/14 1205    OBJECTIVE: Filed Vitals:   01/25/15 0537 01/25/15 1146  BP: 114/66 136/92  Pulse: 57 52  Temp: 97.6 F (36.4 C) 98 F (36.7 C)  Resp: 20 18     Body mass index is 36.8 kg/(m^2).    ECOG FS:0 - Asymptomatic  General: Well-developed, well-nourished, no acute distress. Eyes: Pink  conjunctiva, anicteric sclera. HEENT: Normocephalic, moist mucous membranes, clear oropharnyx. Lungs: Clear to auscultation bilaterally. Heart: Regular rate and rhythm. No rubs, murmurs, or gallops. Abdomen: Soft, nontender, nondistended. No organomegaly noted, normoactive bowel sounds. Musculoskeletal: No edema, cyanosis, or clubbing. Neuro: Alert, answering all questions appropriately. Cranial nerves grossly intact. Skin: No rashes or petechiae noted. Psych: Normal affect. Lymphatics:  No cervical, calvicular, axillary or inguinal LAD.   LAB RESULTS:  Lab Results  Component Value Date   NA 139 01/25/2015   K 3.5 01/25/2015   CL 111 01/25/2015   CO2 28 01/25/2015   GLUCOSE 100* 01/25/2015   BUN 24* 01/25/2015   CREATININE 1.00 01/25/2015   CALCIUM 8.6* 01/25/2015   PROT 6.6 12/14/2014   ALBUMIN 4.0 12/14/2014   AST 29 12/14/2014   ALT 21 12/14/2014   ALKPHOS 90 12/14/2014   BILITOT 1.4* 12/14/2014   GFRNONAA >60 01/25/2015   GFRAA >60 01/25/2015    Lab Results  Component Value Date   WBC 5.9 01/25/2015   NEUTROABS 1.5 12/14/2014   HGB 9.2* 01/25/2015   HCT 27.7* 01/25/2015   MCV 91.4 01/25/2015   PLT 188 01/25/2015     STUDIES: Dg Chest 2 View  01/24/2015  CLINICAL DATA:  Onset of a burning chest sensation and began 1 lower ago with pain radiating down both arms ; history of MI in April 2016, history of pericarditis and breast malignancy EXAM: CHEST  2 VIEW COMPARISON:  Portable chest x-ray of August 30, 2014 FINDINGS: The lungs are adequately inflated and clear. The heart and pulmonary vascularity are normal. The mediastinum is normal in width. There is no pleural effusion. The Port-A-Cath appliance tip projects at the junction of the middle and distal thirds of the SVC. The bony structures are unremarkable. IMPRESSION: There is no active cardiopulmonary disease. Electronically Signed   By: David  Martinique M.D.   On: 01/24/2015 16:48    ASSESSMENT: Acute MI, breast  cancer receiving Herceptin.  PLAN:    1. Acute MI: Appreciate cardiology input. Patient is being discharged today with stress test tomorrow. This is unlikely related to her Herceptin or her breast cancer treatments. 2. Breast cancer: Patient was scheduled for cycle 7 of Herceptin today, but will delay this 3 weeks so that she can recover from her MI. Will repeat MUGA scan prior to next treatment to ensure that is safe to continue with Herceptin. No further intervention is needed at this time.  Appreciate consult, call with questions.    Breast cancer, female [facility])   Staging form: Breast, AJCC 7th Edition     Pathologic stage from 08/17/2014: Stage IA (T1c, N0, cM0) - Signed by Lloyd Huger, MD on 08/17/2014   Lloyd Huger, MD   01/25/2015 2:11 PM

## 2015-01-25 NOTE — Progress Notes (Signed)
Pt refusing second IV access. Will try again if she ends up needing any IV meds

## 2015-01-25 NOTE — Discharge Summary (Signed)
[place] [facility] [place] NAME: Robin Howard    MR#:  [id number]  DATE OF BIRTH:  1960/08/09  DATE OF ADMISSION:  01/24/2015 ADMITTING PHYSICIAN: Lance Coon, MD  DATE OF DISCHARGE: 01/25/2015  PRIMARY CARE PHYSICIAN: Enid Derry, MD    ADMISSION DIAGNOSIS:  Coronary syndrome, acute ([place]) [I24.9]  DISCHARGE DIAGNOSIS:  Principal Problem:   Angina pectoris ([place]) Active Problems:   Major depressive disorder, recurrent ([facility])   HTN (hypertension)   Hypothyroidism   GERD (gastroesophageal reflux disease)   Breast cancer, female ([place])   Elevated troponin   NSTEMI (non-ST elevated myocardial infarction) ([place])   SECONDARY DIAGNOSIS:   Past Medical History  Diagnosis Date  . Major depressive disorder, recurrent ([place])   . HTN (hypertension)   . Overactive bladder   . Migraines   . Gastritis   . Hypothyroidism   . OSA (obstructive sleep apnea)     untreated due to lack of insurance  . Insomnia   . GERD (gastroesophageal reflux disease)   . Pericarditis 1997  . Detached retina 1997  . Chronic headache   . Morbid obesity ([place])   . NSTEMI (non-ST elevated myocardial infarction) ([place]) 04/16/14    Due to demand- normal Cath 04/19/14  . Breast cancer ([place])   . Bronchitis 04-2014  . Shortness of breath dyspnea     with exertion only  . Anemia     h/o  . Pericarditis 1997  . Family history of adverse reaction to anesthesia     pts dad has [place] COURSE:  * angina - Tele monitor and troponin went higher on follow up.   Started on heparin drip for NSTEMI.    Seen by Cardiologist Dr. Humphrey Rolls.    He reviewed pt's recent Cardiac cath- 04/19/14- was without significant blockages.    Suggested to give ASA, Plavix, Imdur.    Cardiologist ruled out NSTEMI.    Follow in office tomorrow for stress test in am.   Pt does not have any pain on ambulation in hospital.    Discharge today.  * Elevated troponin -   Suspected NSTEMI on admission- but cardiologist ruled out and going to do stress test.   Kept on hepain drip, but d/c now and stress test tomorrow.  * HTN (hypertension) - at goal here, monitor closely and treat when necessary if greater than 160/100 * Breast cancer, female [facility]) - patient relates she is scheduled for Herceptin treatment, consulted oncology for the same. * Major depressive disorder, recurrent ([place]) - continue home meds * Hypothyroidism - home dose thyroid replacement * GERD (gastroesophageal reflux disease) - home dose H2 blocker   DISCHARGE CONDITIONS:   Stable.  CONSULTS OBTAINED:  Treatment Team:  Dionisio David, MD Lloyd Huger, MD  DRUG ALLERGIES:   Allergies  Allergen Reactions  . Reglan [Metoclopramide] Shortness Of Breath and Other (See Comments)    This medication caused a dystonic reaction.    Marland Kitchen Paxil [Paroxetine Hcl] Other (See Comments)    Reaction:  Blurred vision  . Tegaderm Ag Mesh [Silver] Rash  . Ultram [Tramadol] Itching and Rash    DISCHARGE MEDICATIONS:   Current Discharge Medication List    START taking these medications   Details  clopidogrel (PLAVIX) 75 MG tablet Take 1 tablet (75 mg total) by mouth daily. Qty: 30 tablet, Refills: 1    isosorbide mononitrate (IMDUR) 30 MG 24 hr tablet Take 1 tablet (  30 mg total) by mouth daily. Qty: 30 tablet, Refills: 0      CONTINUE these medications which have CHANGED   Details  HYDROcodone-acetaminophen (NORCO) 5-325 MG tablet Take 1 tablet by mouth every 6 (six) hours as needed for moderate pain. Qty: 15 tablet, Refills: 0      CONTINUE these medications which have NOT CHANGED   Details  acetaminophen (TYLENOL) 500 MG tablet Take 1,000 mg by mouth every 6 (six) hours as needed for mild pain or headache.    aspirin EC 81 MG tablet Take 81 mg by mouth daily.    cholecalciferol (VITAMIN D) 1000 UNITS tablet Take 1,000 Units by mouth daily.    diazepam (VALIUM) 5 MG tablet  Take 1 tablet (5 mg total) by mouth every 8 (eight) hours as needed for anxiety. Qty: 30 tablet, Refills: 0    diphenhydrAMINE (BENADRYL) 25 MG tablet Take 25 mg by mouth every 6 (six) hours as needed for allergies.     escitalopram (LEXAPRO) 20 MG tablet Take 20 mg by mouth daily.    levothyroxine (SYNTHROID, LEVOTHROID) 100 MCG tablet Take 1 tablet (100 mcg total) by mouth daily. Qty: 30 tablet, Refills: 11    lidocaine-prilocaine (EMLA) cream Apply 1 application topically as needed (prior to accessing port).    Multiple Vitamin (MULTIVITAMIN WITH MINERALS) TABS tablet Take 1 tablet by mouth daily.    naproxen sodium (ANAPROX) 220 MG tablet Take 440 mg by mouth 2 (two) times daily as needed (for pain).    ranitidine (ZANTAC) 150 MG tablet Take 300 mg by mouth at bedtime.    vitamin B-12 (CYANOCOBALAMIN) 1000 MCG tablet Take 1,000 mcg by mouth daily.    diphenoxylate-atropine (LOMOTIL) 2.5-0.025 MG per tablet Take 2 tablets by mouth 4 (four) times daily as needed for diarrhea or loose stools. Qty: 60 tablet, Refills: 2    ondansetron (ZOFRAN ODT) 4 MG disintegrating tablet Take 1 tablet (4 mg total) by mouth every 8 (eight) hours as needed for nausea or vomiting. Qty: 20 tablet, Refills: 0    potassium chloride SA (K-DUR,KLOR-CON) 20 MEQ tablet Take 1 tablet (20 mEq total) by mouth 2 (two) times daily. Qty: 60 tablet, Refills: 2    prochlorperazine (COMPAZINE) 10 MG tablet Take 1 tablet (10 mg total) by mouth every 6 (six) hours as needed for nausea or vomiting. Qty: 30 tablet, Refills: 1   Associated Diagnoses: Breast cancer, female, left         DISCHARGE INSTRUCTIONS:    Follow with Dr. Humphrey Rolls in office tomorrow am.  If you experience worsening of your admission symptoms, develop shortness of breath, life threatening emergency, suicidal or homicidal thoughts you must seek medical attention immediately by calling 911 or calling your MD immediately  if symptoms less  severe.  You Must read complete instructions/literature along with all the possible adverse reactions/side effects for all the Medicines you take and that have been prescribed to you. Take any new Medicines after you have completely understood and accept all the possible adverse reactions/side effects.   Please note  You were cared for by a hospitalist during your hospital stay. If you have any questions about your discharge medications or the care you received while you were in the hospital after you are discharged, you can call the unit and asked to speak with the hospitalist on call if the hospitalist that took care of you is not available. Once you are discharged, your primary care physician will handle any further  medical issues. Please note that NO REFILLS for any discharge medications will be authorized once you are discharged, as it is imperative that you return to your primary care physician (or establish a relationship with a primary care physician if you do not have one) for your aftercare needs so that they can reassess your need for medications and monitor your lab values.    Today   CHIEF COMPLAINT:   Chief Complaint  Patient presents with  . Chest Pain    HISTORY OF PRESENT ILLNESS:  Robin Howard  is a 55 y.o. female presents with an episode of chest pain. Patient states that she was at work today, not spitting any strenuous activity, when she developed central chest pain. She describes this pain as initially a burning sensation, which then radiated to her bilateral jaw and the back of her neck. She states that the pain lasted for about 40-45 minutes, and that there were no associated alleviating or aggravating factors. Patient had a similar episode of chest pain early last year, and was worked up including cardiac catheterization. Of note, the patient did have positive troponin at that time. However, her cardiac catheterization states that, per the discharge summary on chart review,  states that she was not found to have any blockages, and that her non-STEMI was felt to be due to demand ischemia. On evaluation in the ED today she once again has a positive troponin at 0.05 and then 0.68. Hospitalists were called for admission.   VITAL SIGNS:  Blood pressure 136/92, pulse 52, temperature 98 F (36.7 C), temperature source Oral, resp. rate 18, height 5\' 6"  (1.676 m), weight 103.375 kg (227 lb 14.4 oz), SpO2 100 %.  I/O:   Intake/Output Summary (Last 24 hours) at 01/25/15 1424 Last data filed at 01/25/15 0537  Gross per 24 hour  Intake      0 ml  Output    300 ml  Net   -300 ml    PHYSICAL EXAMINATION:  GENERAL:  55 y.o.-year-old patient lying in the bed with no acute distress.  EYES: Pupils equal, round, reactive to light and accommodation. No scleral icterus. Extraocular muscles intact.  HEENT: Head atraumatic, normocephalic. Oropharynx and nasopharynx clear.  NECK:  Supple, no jugular venous distention. No thyroid enlargement, no tenderness.  LUNGS: Normal breath sounds bilaterally, no wheezing, rales,rhonchi or crepitation. No use of accessory muscles of respiration.  CARDIOVASCULAR: S1, S2 normal. No murmurs, rubs, or gallops.  ABDOMEN: Soft, non-tender, non-distended. Bowel sounds present. No organomegaly or mass.  EXTREMITIES: No pedal edema, cyanosis, or clubbing.  NEUROLOGIC: Cranial nerves II through XII are intact. Muscle strength 5/5 in all extremities. Sensation intact. Gait not checked.  PSYCHIATRIC: The patient is alert and oriented x 3.  SKIN: No obvious rash, lesion, or ulcer.   DATA REVIEW:   CBC  Recent Labs Lab 01/25/15 0230  WBC 5.9  HGB 9.2*  HCT 27.7*  PLT 188    Chemistries   Recent Labs Lab 01/25/15 0230  NA 139  K 3.5  CL 111  CO2 28  GLUCOSE 100*  BUN 24*  CREATININE 1.00  CALCIUM 8.6*    Cardiac Enzymes  Recent Labs Lab 01/25/15 0056  TROPONINI 1.02*    Microbiology Results  Results for orders placed or  performed during the hospital encounter of 09/23/14  Blood culture (routine x 2)     Status: None   Collection Time: 09/23/14  4:26 PM  Result Value Ref Range Status  Specimen Description BLOOD RIGHT ARM  Final   Special Requests BOTTLES DRAWN AEROBIC AND ANAEROBIC 6CC  Final   Culture NO GROWTH 5 DAYS  Final   Report Status 09/28/2014 FINAL  Final  Blood culture (routine x 2)     Status: None   Collection Time: 09/23/14  5:30 PM  Result Value Ref Range Status   Specimen Description BLOOD RIGHT ARM  Final   Special Requests BOTTLES DRAWN AEROBIC AND ANAEROBIC 5CC  Final   Culture NO GROWTH 5 DAYS  Final   Report Status 09/28/2014 FINAL  Final  C difficile quick scan w PCR reflex     Status: None   Collection Time: 09/23/14  8:57 PM  Result Value Ref Range Status   C Diff antigen NEGATIVE NEGATIVE Final   C Diff toxin NEGATIVE NEGATIVE Final   C Diff interpretation Negative for C. difficile  Final    RADIOLOGY:  Dg Chest 2 View  01/24/2015  CLINICAL DATA:  Onset of a burning chest sensation and began 1 lower ago with pain radiating down both arms ; history of MI in April 2016, history of pericarditis and breast malignancy EXAM: CHEST  2 VIEW COMPARISON:  Portable chest x-ray of August 30, 2014 FINDINGS: The lungs are adequately inflated and clear. The heart and pulmonary vascularity are normal. The mediastinum is normal in width. There is no pleural effusion. The Port-A-Cath appliance tip projects at the junction of the middle and distal thirds of the SVC. The bony structures are unremarkable. IMPRESSION: There is no active cardiopulmonary disease. Electronically Signed   By: David  Martinique M.D.   On: 01/24/2015 16:48    EKG:   Orders placed or performed during the hospital encounter of 01/24/15  . ED EKG within 10 minutes  . ED EKG within 10 minutes      Management plans discussed with the patient, family and they are in agreement.  CODE STATUS:     Code Status Orders         Start     Ordered   01/25/15 0030  Full code   Continuous     01/25/15 0030    Code Status History    Date Active Date Inactive Code Status Order ID Comments User Context   09/23/2014  6:45 PM 09/24/2014  7:53 PM Full Code [id number]  Lance Coon, MD Inpatient    Advance Directive Documentation        Most Recent Value   Type of Advance Directive  Healthcare Power of Attorney   Pre-existing out of facility DNR order (yellow form or pink MOST form)     "MOST" Form in Place?        TOTAL TIME TAKING CARE OF THIS PATIENT: 35 minutes.  Discussed with Dr. Humphrey Rolls and pt's son present in room.  Vaughan Basta M.D on 01/25/2015 at 2:24 PM  Between 7am to 6pm - Pager - [phone]  After 6pm go to [email or web address] - password [email or web address] [facility]  Office  [phone]  CC: Primary care physician; Enid Derry, MD   Note: This dictation was prepared with Dragon dictation along with smaller phrase technology. Any transcriptional errors that result from this process are unintentional.

## 2015-02-02 ENCOUNTER — Encounter: Payer: Self-pay | Admitting: Family Medicine

## 2015-02-02 ENCOUNTER — Ambulatory Visit (INDEPENDENT_AMBULATORY_CARE_PROVIDER_SITE_OTHER): Payer: Medicaid Other | Admitting: Family Medicine

## 2015-02-02 ENCOUNTER — Telehealth: Payer: Self-pay

## 2015-02-02 VITALS — BP 155/94 | HR 88 | Temp 98.7°F | Ht 64.5 in | Wt 224.0 lb

## 2015-02-02 DIAGNOSIS — I214 Non-ST elevation (NSTEMI) myocardial infarction: Secondary | ICD-10-CM

## 2015-02-02 DIAGNOSIS — I10 Essential (primary) hypertension: Secondary | ICD-10-CM

## 2015-02-02 DIAGNOSIS — J0110 Acute frontal sinusitis, unspecified: Secondary | ICD-10-CM

## 2015-02-02 MED ORDER — BENZONATATE 100 MG PO CAPS: 100.0000 mg | ORAL_CAPSULE | Freq: Three times a day (TID) | ORAL | Status: DC | PRN

## 2015-02-02 MED ORDER — AMOXICILLIN 875 MG PO TABS: 875.0000 mg | ORAL_TABLET | Freq: Two times a day (BID) | ORAL | Status: DC

## 2015-02-02 MED ORDER — FLUCONAZOLE 150 MG PO TABS: 150.0000 mg | ORAL_TABLET | Freq: Once | ORAL | Status: DC

## 2015-02-02 NOTE — Progress Notes (Signed)
BP 155/94 mmHg  Pulse 88  Temp(Src) 98.7 F (37.1 C)  Ht 5' 4.5" (1.638 m)  Wt 224 lb (101.606 kg)  BMI 37.87 kg/m2  SpO2 98%   Subjective:    Patient ID: Robin Howard, female    DOB: May 18, 1960, 55 y.o.   MRN: [id number]  HPI: Robin Howard is a 55 y.o. female  Chief Complaint  Patient presents with  . URI    Since Saturday. Head and chest congestion, productive cough, sore throat, runny nose, headache. No fever.   She feels terrible Can't sleep at night for the coughing and sore throat Started to get sick on Saturday Started with itch in the throat; started with scratchy throat in the morning Then really sore throat the next day, hurt to swallow Nasal congestion, teeth have been sore; has had sinus infection Left ear bothering her Headaches but no body aches; right hand a little stiff No rash No travel No known sick contacts; was in the hospital January 9th; no chest pain She has been taking Benadryl  She is going to have a CCTA on Friday; no chest pain; Dr. Humphrey Rolls She is not taking atenolol right now; they stopped it in April  Relevant past medical, surgical, family and social history reviewed and updated as indicated Past Medical History  Diagnosis Date  . Major depressive disorder, recurrent ([place])   . HTN (hypertension)   . Overactive bladder   . Migraines   . Gastritis   . Hypothyroidism   . OSA (obstructive sleep apnea)     untreated due to lack of insurance  . Insomnia   . GERD (gastroesophageal reflux disease)   . Pericarditis 1997  . Detached retina 1997  . Chronic headache   . Morbid obesity ([place])   . NSTEMI (non-ST elevated myocardial infarction) ([place]) 04/16/14    Due to demand- normal Cath 04/19/14  . Breast cancer ([place])   . Bronchitis 04-2014  . Shortness of breath dyspnea     with exertion only  . Anemia     h/o  . Pericarditis 1997  . Family history of adverse reaction to anesthesia     pts dad has pseudocholinestrace deficieny   Family  History  Problem Relation Age of Onset  . Alcohol abuse Mother   . Thyroid disease Mother   . Hypertension Mother   . COPD Mother   . Cancer Father     liposarcoma  . Congestive Heart Failure Father   . Depression Father   . Hypertension Father   . Cancer Maternal Uncle     Pancreatic cancer  . Heart disease Paternal Grandfather 63    MI  . Diabetes Other   . Cancer Sister     breast  . Breast cancer Sister 62   Social History  Substance Use Topics  . Smoking status: Never Smoker   . Smokeless tobacco: Never Used  . Alcohol Use: No   Interim medical history since our last visit reviewed. Allergies and medications reviewed and updated.  Review of Systems Per HPI unless specifically indicated above     Objective:    BP 155/94 mmHg  Pulse 88  Temp(Src) 98.7 F (37.1 C)  Ht 5' 4.5" (1.638 m)  Wt 224 lb (101.606 kg)  BMI 37.87 kg/m2  SpO2 98%  Wt Readings from Last 3 Encounters:  02/02/15 224 lb (101.606 kg)  01/25/15 227 lb 14.4 oz (103.375 kg)  01/04/15 229 lb 13.3 oz (104.25 kg)  Physical Exam  Constitutional: She appears well-developed and well-nourished.  Non-toxic appearance. She appears ill (but appears to not feel well). No distress.  Cardiovascular: Normal rate and regular rhythm.   Pulmonary/Chest: Effort normal and breath sounds normal.  Skin: She is not diaphoretic.  Psychiatric: She has a normal mood and affect.      Assessment & Plan:   Problem List Items Addressed This Visit      Cardiovascular and Mediastinum   HTN (hypertension)    Not at goal; patient not on a beta-blocker; we called cardiologist's office; my staff left word with their staff to have them contact patient directly about beta-blocker; avoid decongesatnts; try DASH guidelines; see AVS      NSTEMI (non-ST elevated myocardial infarction) ([place])    Recent heart attack; I am surprised that she is not on a beta-blocker; I had my staff call the cardiologist, not available to talk  right now; message left asking him or his staff to call patient directly about the beta-blocker, whether or not she should go back on that; further testing with cardiologist soon       Other Visit Diagnoses    Acute frontal sinusitis, recurrence not specified    -  Primary    start antibiotics, symptomatic care; caution about C diff, see AVS    Relevant Medications    amoxicillin (AMOXIL) 875 MG tablet    fluconazole (DIFLUCAN) 150 MG tablet       Follow up plan: No Follow-up on file.  Meds ordered this encounter  Medications  . amoxicillin (AMOXIL) 875 MG tablet    Sig: Take 1 tablet (875 mg total) by mouth 2 (two) times daily.    Dispense:  20 tablet    Refill:  0  . fluconazole (DIFLUCAN) 150 MG tablet    Sig: Take 1 tablet (150 mg total) by mouth once.    Dispense:  1 tablet    Refill:  0  . DISCONTD: benzonatate (TESSALON PERLES) 100 MG capsule    Sig: Take 1 capsule (100 mg total) by mouth every 8 (eight) hours as needed for cough.    Dispense:  30 capsule    Refill:  0   An after-visit summary was printed and given to the patient at [place].  Please see the patient instructions which may contain other information and recommendations beyond what is mentioned above in the assessment and plan.

## 2015-02-02 NOTE — Patient Instructions (Addendum)
Start the new antibiotics Use the fluconazole if needed Try vitamin C (orange juice if not diabetic or vitamin C tablets) and drink green tea to help your immune system during your illness Get plenty of rest and hydration Use the cough medicine if needed You are contagious, so I do recommend going home Use cough medicine as directed if needed Your goal blood pressure is less than 140 mmHg on top. Try to follow the DASH guidelines (DASH stands for Dietary Approaches to Stop Hypertension) Try to limit the sodium in your diet.  Ideally, consume less than 1.5 grams (less than 1,500mg ) per day. Do not add salt when cooking or at the table.  Check the sodium amount on labels when shopping, and choose items lower in sodium when given a choice. Avoid or limit foods that already contain a lot of sodium. Eat a diet rich in fruits and vegetables and whole grains. Try to use PLAIN allergy medicine without the decongestant Avoid: phenylephrine, phenylpropanolamine, and pseudoephredine Please do have your blood pressure checked on Friday Amy is leaving a message with Dr. Humphrey Rolls and we'll ask him to call you directly about the atenolol  DASH Eating Plan DASH stands for "Dietary Approaches to Stop Hypertension." The DASH eating plan is a healthy eating plan that has been shown to reduce high blood pressure (hypertension). Additional health benefits may include reducing the risk of type 2 diabetes mellitus, heart disease, and stroke. The DASH eating plan may also help with weight loss. WHAT DO I NEED TO KNOW ABOUT THE DASH EATING PLAN? For the DASH eating plan, you will follow these general guidelines:  Choose foods with a percent daily value for sodium of less than 5% (as listed on the food label).  Use salt-free seasonings or herbs instead of table salt or sea salt.  Check with your health care provider or pharmacist before using salt substitutes.  Eat lower-sodium products, often labeled as "lower sodium"  or "no salt added."  Eat fresh foods.  Eat more vegetables, fruits, and low-fat dairy products.  Choose whole grains. Look for the word "whole" as the first word in the ingredient list.  Choose fish and skinless chicken or [place] more often than red meat. Limit fish, poultry, and meat to 6 oz (170 g) each day.  Limit sweets, desserts, sugars, and sugary drinks.  Choose heart-healthy fats.  Limit cheese to 1 oz (28 g) per day.  Eat more home-cooked food and less restaurant, buffet, and fast food.  Limit fried foods.  Cook foods using methods other than frying.  Limit canned vegetables. If you do use them, rinse them well to decrease the sodium.  When eating at a restaurant, ask that your food be prepared with less salt, or no salt if possible. WHAT FOODS CAN I EAT? Seek help from a dietitian for individual calorie needs. Grains Whole grain or whole wheat bread. Brown rice. Whole grain or whole wheat pasta. Quinoa, bulgur, and whole grain cereals. Low-sodium cereals. Corn or whole wheat flour tortillas. Whole grain cornbread. Whole grain crackers. Low-sodium crackers. Vegetables Fresh or frozen vegetables (raw, steamed, roasted, or grilled). Low-sodium or reduced-sodium tomato and vegetable juices. Low-sodium or reduced-sodium tomato sauce and paste. Low-sodium or reduced-sodium canned vegetables.  Fruits All fresh, canned (in natural juice), or frozen fruits. Meat and Other Protein Products Ground beef (85% or leaner), grass-fed beef, or beef trimmed of fat. Skinless chicken or [place]. Ground chicken or [place]. Pork trimmed of fat. All fish and seafood.  Eggs. Dried beans, peas, or lentils. Unsalted nuts and seeds. Unsalted canned beans. Dairy Low-fat dairy products, such as skim or 1% milk, 2% or reduced-fat cheeses, low-fat ricotta or cottage cheese, or plain low-fat yogurt. Low-sodium or reduced-sodium cheeses. Fats and Oils Tub margarines without trans fats. Light or  reduced-fat mayonnaise and salad dressings (reduced sodium). Avocado. Safflower, olive, or canola oils. Natural peanut or almond butter. Other Unsalted popcorn and pretzels. The items listed above may not be a complete list of recommended foods or beverages. Contact your dietitian for more options. WHAT FOODS ARE NOT RECOMMENDED? Grains White bread. White pasta. White rice. Refined cornbread. Bagels and croissants. Crackers that contain trans fat. Vegetables Creamed or fried vegetables. Vegetables in a cheese sauce. Regular canned vegetables. Regular canned tomato sauce and paste. Regular tomato and vegetable juices. Fruits Dried fruits. Canned fruit in light or heavy syrup. Fruit juice. Meat and Other Protein Products Fatty cuts of meat. Ribs, chicken wings, bacon, sausage, bologna, salami, chitterlings, fatback, hot dogs, bratwurst, and packaged luncheon meats. Salted nuts and seeds. Canned beans with salt. Dairy Whole or 2% milk, cream, half-and-half, and cream cheese. Whole-fat or sweetened yogurt. Full-fat cheeses or blue cheese. Nondairy creamers and whipped toppings. Processed cheese, cheese spreads, or cheese curds. Condiments Onion and garlic salt, seasoned salt, table salt, and sea salt. Canned and packaged gravies. Worcestershire sauce. Tartar sauce. Barbecue sauce. Teriyaki sauce. Soy sauce, including reduced sodium. Steak sauce. Fish sauce. Oyster sauce. Cocktail sauce. Horseradish. Ketchup and mustard. Meat flavorings and tenderizers. Bouillon cubes. Hot sauce. Tabasco sauce. Marinades. Taco seasonings. Relishes. Fats and Oils Butter, stick margarine, lard, shortening, ghee, and bacon fat. Coconut, palm kernel, or palm oils. Regular salad dressings. Other Pickles and olives. Salted popcorn and pretzels. The items listed above may not be a complete list of foods and beverages to avoid. Contact your dietitian for more information. WHERE CAN I FIND MORE INFORMATION? National Heart,  Lung, and Blood Institute: travelstabloid.com   This information is not intended to replace advice given to you by your health care provider. Make sure you discuss any questions you have with your health care provider.   Document Released: 12/21/2010 Document Revised: 01/22/2014 Document Reviewed: 11/05/2012 Elsevier Interactive Patient Education [employer].

## 2015-02-02 NOTE — Telephone Encounter (Signed)
I left detailed message on Dr.Khan's nurses line to advise Dr.Kahn of her BP and pulse today. He took her off Imdur and now her BP is elevated. Does he want to start her on a beta blocker? I asked that they please call the patient and let her know what she should do and advise her of any medication changes.

## 2015-02-03 MED ORDER — BENZONATATE 100 MG PO CAPS: 100.0000 mg | ORAL_CAPSULE | Freq: Three times a day (TID) | ORAL | Status: DC | PRN

## 2015-02-11 ENCOUNTER — Ambulatory Visit
Admission: RE | Admit: 2015-02-11 | Discharge: 2015-02-11 | Disposition: A | Discharge status: Home / Self Care | Payer: Medicaid Other | Source: Ambulatory Visit | Attending: Oncology | Admitting: Oncology

## 2015-02-11 DIAGNOSIS — C50912 Malignant neoplasm of unspecified site of left female breast: Secondary | ICD-10-CM | POA: Diagnosis present

## 2015-02-11 DIAGNOSIS — Z01818 Encounter for other preprocedural examination: Secondary | ICD-10-CM | POA: Diagnosis not present

## 2015-02-11 MED ORDER — TECHNETIUM TC 99M-LABELED RED BLOOD CELLS IV KIT
20.0000 | PACK | Freq: Once | INTRAVENOUS | Status: AC | PRN
  Administered 2015-02-11: 18.65 via INTRAVENOUS

## 2015-02-13 NOTE — Assessment & Plan Note (Signed)
Not at goal; patient not on a beta-blocker; we called cardiologist's office; my staff left word with their staff to have them contact patient directly about beta-blocker; avoid decongesatnts; try DASH guidelines; see AVS

## 2015-02-13 NOTE — Assessment & Plan Note (Signed)
Recent heart attack; I am surprised that she is not on a beta-blocker; I had my staff call the cardiologist, not available to talk right now; message left asking him or his staff to call patient directly about the beta-blocker, whether or not she should go back on that; further testing with cardiologist soon

## 2015-02-14 ENCOUNTER — Telehealth: Payer: Self-pay | Admitting: *Deleted

## 2015-02-14 NOTE — Telephone Encounter (Signed)
Informed MUGA scan nml and can proceed with appt as scheduled

## 2015-02-15 ENCOUNTER — Inpatient Hospital Stay: Payer: Medicaid Other | Attending: Oncology

## 2015-02-15 ENCOUNTER — Inpatient Hospital Stay: Payer: Medicaid Other

## 2015-02-15 ENCOUNTER — Inpatient Hospital Stay (HOSPITAL_BASED_OUTPATIENT_CLINIC_OR_DEPARTMENT_OTHER): Payer: Medicaid Other | Admitting: Oncology

## 2015-02-15 ENCOUNTER — Inpatient Hospital Stay: Payer: Medicaid Other | Admitting: Oncology

## 2015-02-15 VITALS — BP 141/60 | HR 74 | Temp 97.9°F | Resp 18 | Wt 227.3 lb

## 2015-02-15 DIAGNOSIS — C50912 Malignant neoplasm of unspecified site of left female breast: Secondary | ICD-10-CM

## 2015-02-15 DIAGNOSIS — I319 Disease of pericardium, unspecified: Secondary | ICD-10-CM

## 2015-02-15 DIAGNOSIS — Z800 Family history of malignant neoplasm of digestive organs: Secondary | ICD-10-CM | POA: Diagnosis not present

## 2015-02-15 DIAGNOSIS — Z5189 Encounter for other specified aftercare: Secondary | ICD-10-CM

## 2015-02-15 DIAGNOSIS — G4700 Insomnia, unspecified: Secondary | ICD-10-CM | POA: Diagnosis not present

## 2015-02-15 DIAGNOSIS — R0602 Shortness of breath: Secondary | ICD-10-CM

## 2015-02-15 DIAGNOSIS — Z171 Estrogen receptor negative status [ER-]: Secondary | ICD-10-CM | POA: Insufficient documentation

## 2015-02-15 DIAGNOSIS — I10 Essential (primary) hypertension: Secondary | ICD-10-CM | POA: Insufficient documentation

## 2015-02-15 DIAGNOSIS — D649 Anemia, unspecified: Secondary | ICD-10-CM | POA: Diagnosis not present

## 2015-02-15 DIAGNOSIS — G4730 Sleep apnea, unspecified: Secondary | ICD-10-CM | POA: Insufficient documentation

## 2015-02-15 DIAGNOSIS — Z803 Family history of malignant neoplasm of breast: Secondary | ICD-10-CM

## 2015-02-15 DIAGNOSIS — N3281 Overactive bladder: Secondary | ICD-10-CM | POA: Insufficient documentation

## 2015-02-15 DIAGNOSIS — Z8669 Personal history of other diseases of the nervous system and sense organs: Secondary | ICD-10-CM | POA: Insufficient documentation

## 2015-02-15 DIAGNOSIS — I252 Old myocardial infarction: Secondary | ICD-10-CM | POA: Diagnosis not present

## 2015-02-15 DIAGNOSIS — F329 Major depressive disorder, single episode, unspecified: Secondary | ICD-10-CM | POA: Diagnosis not present

## 2015-02-15 DIAGNOSIS — E039 Hypothyroidism, unspecified: Secondary | ICD-10-CM | POA: Diagnosis not present

## 2015-02-15 DIAGNOSIS — C50919 Malignant neoplasm of unspecified site of unspecified female breast: Secondary | ICD-10-CM

## 2015-02-15 DIAGNOSIS — Z79899 Other long term (current) drug therapy: Secondary | ICD-10-CM | POA: Diagnosis not present

## 2015-02-15 DIAGNOSIS — Z808 Family history of malignant neoplasm of other organs or systems: Secondary | ICD-10-CM | POA: Diagnosis not present

## 2015-02-15 DIAGNOSIS — Z7982 Long term (current) use of aspirin: Secondary | ICD-10-CM

## 2015-02-15 DIAGNOSIS — Z8719 Personal history of other diseases of the digestive system: Secondary | ICD-10-CM | POA: Diagnosis not present

## 2015-02-15 DIAGNOSIS — Z5112 Encounter for antineoplastic immunotherapy: Secondary | ICD-10-CM | POA: Diagnosis present

## 2015-02-15 DIAGNOSIS — K219 Gastro-esophageal reflux disease without esophagitis: Secondary | ICD-10-CM

## 2015-02-15 DIAGNOSIS — E669 Obesity, unspecified: Secondary | ICD-10-CM | POA: Diagnosis not present

## 2015-02-15 LAB — CBC WITH DIFFERENTIAL/PLATELET
BASOS ABS: 0.1 10*3/uL (ref 0–0.1)
Basophils Relative: 1 %
EOS ABS: 0.4 10*3/uL (ref 0–0.7)
EOS PCT: 7 %
HCT: 33 % — ABNORMAL LOW (ref 35.0–47.0)
HEMOGLOBIN: 11 g/dL — AB (ref 12.0–16.0)
LYMPHS ABS: 1.7 10*3/uL (ref 1.0–3.6)
LYMPHS PCT: 29 %
MCH: 29.8 pg (ref 26.0–34.0)
MCHC: 33.3 g/dL (ref 32.0–36.0)
MCV: 89.6 fL (ref 80.0–100.0)
Monocytes Absolute: 0.5 10*3/uL (ref 0.2–0.9)
Monocytes Relative: 9 %
NEUTROS PCT: 54 %
Neutro Abs: 3.1 10*3/uL (ref 1.4–6.5)
PLATELETS: 236 10*3/uL (ref 150–440)
RBC: 3.68 MIL/uL — AB (ref 3.80–5.20)
RDW: 14.1 % (ref 11.5–14.5)
WBC: 5.9 10*3/uL (ref 3.6–11.0)

## 2015-02-15 LAB — COMPREHENSIVE METABOLIC PANEL
ALK PHOS: 85 U/L (ref 38–126)
ALT: 17 U/L (ref 14–54)
AST: 20 U/L (ref 15–41)
Albumin: 3.7 g/dL (ref 3.5–5.0)
Anion gap: 6 (ref 5–15)
BUN: 16 mg/dL (ref 6–20)
CALCIUM: 9 mg/dL (ref 8.9–10.3)
CHLORIDE: 104 mmol/L (ref 101–111)
CO2: 26 mmol/L (ref 22–32)
CREATININE: 0.99 mg/dL (ref 0.44–1.00)
GFR calc Af Amer: >60 mL/min (ref >60)
GFR calc non Af Amer: >60 mL/min (ref >60)
GLUCOSE: 101 mg/dL — AB (ref 65–99)
Potassium: 3.6 mmol/L (ref 3.5–5.1)
SODIUM: 136 mmol/L (ref 135–145)
Total Bilirubin: 0.8 mg/dL (ref 0.3–1.2)
Total Protein: 7 g/dL (ref 6.5–8.1)

## 2015-02-15 MED ORDER — SODIUM CHLORIDE 0.9% FLUSH
10.0000 mL | Freq: Once | INTRAVENOUS | Status: AC
  Administered 2015-02-15: 10 mL via INTRAVENOUS
  Filled 2015-02-15: qty 10

## 2015-02-15 MED ORDER — SODIUM CHLORIDE 0.9 % IV SOLN
Freq: Once | INTRAVENOUS | Status: AC
  Administered 2015-02-15: 10:00:00 via INTRAVENOUS
  Filled 2015-02-15: qty 1000

## 2015-02-15 MED ORDER — TRASTUZUMAB CHEMO INJECTION 440 MG
6.0000 mg/kg | Freq: Once | INTRAVENOUS | Status: AC
  Administered 2015-02-15: 651 mg via INTRAVENOUS
  Filled 2015-02-15: qty 31

## 2015-02-15 MED ORDER — HEPARIN SOD (PORK) LOCK FLUSH 100 UNIT/ML IV SOLN
500.0000 [IU] | Freq: Once | INTRAVENOUS | Status: AC
Start: 2015-02-15 — End: 2015-02-15
  Administered 2015-02-15: 500 [IU] via INTRAVENOUS
  Filled 2015-02-15: qty 5

## 2015-02-15 MED ORDER — ACETAMINOPHEN 325 MG PO TABS
650.0000 mg | ORAL_TABLET | Freq: Once | ORAL | Status: AC
  Administered 2015-02-15: 650 mg via ORAL
  Filled 2015-02-15: qty 2

## 2015-02-15 MED ORDER — DIPHENHYDRAMINE HCL 25 MG PO CAPS
50.0000 mg | ORAL_CAPSULE | Freq: Once | ORAL | Status: AC
  Administered 2015-02-15: 50 mg via ORAL
  Filled 2015-02-15: qty 2

## 2015-02-15 NOTE — Progress Notes (Signed)
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  Chief Complaint  Patient presents with  . Breast Cancer    INTERVAL HISTORY:  Patient returns to clinic today for further evaluation and consideration of cycle 7 of Herceptin only. Patient feels well today and has no complaints. She does not have any further diarrhea. She has no neurologic complaints. She denies any recent fevers or illnesses. Her appetite is improved and her weight is stable. She has no chest pain or shortness of breath. She has no urinary complaints. Patient offers no specific complaints today.  REVIEW OF SYSTEMS:   Review of Systems  Constitutional: Negative.  Negative for fever and malaise/fatigue.  HENT: Negative.   Respiratory: Negative.   Cardiovascular: Negative.   Gastrointestinal: Negative for nausea, abdominal pain and diarrhea.  Musculoskeletal: Negative for back pain.  Neurological: Negative for weakness.  Psychiatric/Behavioral: Negative.     As per HPI. Otherwise, a complete review of systems is negatve.  PAST MEDICAL HISTORY: Past Medical History  Diagnosis Date  . Major depressive disorder, recurrent ([place])   . HTN (hypertension)   . Overactive bladder   . Migraines   . Gastritis   . Hypothyroidism   . OSA (obstructive sleep apnea)     untreated due to lack of insurance  . Insomnia   . GERD (gastroesophageal reflux disease)   . Pericarditis 1997  . Detached retina 1997  . Chronic headache   . Morbid obesity ([place])   . NSTEMI (non-ST elevated myocardial infarction) ([place]) 04/16/14    Due to demand- normal Cath 04/19/14  . Breast  cancer ([place])   . Bronchitis 04-2014  . Shortness of breath dyspnea     with exertion only  . Anemia     h/o  . Pericarditis 1997  . Family history of adverse reaction to anesthesia     pts dad has pseudocholinestrace deficieny    PAST SURGICAL HISTORY: Past Surgical History  Procedure Laterality Date  . Back surgery      L4-5  . Knee surgery    . Cesarean section    . Coronary angioplasty  April 2016  . Retinal detachment surgery Right 1997  . Foot surgery    . Breast lumpectomy with sentinel lymph node biopsy Left 08/13/2014    Procedure: BREAST LUMPECTOMY WITH SENTINEL LYMPH NODE BIOPSY;  Surgeon: Robert Bellow, MD;  Location: [facility] ORS;  Service: General;  Laterality: Left;  . Breast mammosite Left 08/30/2014    Procedure: MAMMOSITE BREAST;  Surgeon: Robert Bellow, MD;  Location: [facility] ORS;  Service: General;  Laterality: Left;  . Portacath placement Right 08/30/2014    Procedure: INSERTION PORT-A-CATH;  Surgeon: Robert Bellow, MD;  Location: [facility] ORS;  Service: General;  Laterality: Right;    FAMILY HISTORY Family History  Problem Relation Age of Onset  . Alcohol abuse Mother   . Thyroid disease Mother   . Hypertension Mother   . COPD Mother   . Cancer Father     liposarcoma  . Congestive Heart Failure Father   . Depression Father   . Hypertension Father   . Cancer Maternal Uncle  Pancreatic cancer  . Heart disease Paternal Grandfather 39    MI  . Diabetes Other   . Cancer Sister     breast  . Breast cancer Sister 51       ADVANCED DIRECTIVES:    HEALTH MAINTENANCE: Social History  Substance Use Topics  . Smoking status: Never Smoker   . Smokeless tobacco: Never Used  . Alcohol Use: No     Colonoscopy:  PAP:  Bone density:  Lipid panel:  Allergies  Allergen Reactions  . Reglan [Metoclopramide] Shortness Of Breath and Other (See Comments)    This medication caused a dystonic reaction.    Marland Kitchen Paxil [Paroxetine Hcl] Other (See Comments)      Reaction:  Blurred vision  . Tegaderm Ag Mesh [Silver] Rash  . Ultram [Tramadol] Itching and Rash    Current Outpatient Prescriptions  Medication Sig Dispense Refill  . acetaminophen (TYLENOL) 500 MG tablet Take 1,000 mg by mouth every 6 (six) hours as needed for mild pain or headache.    Marland Kitchen aspirin EC 81 MG tablet Take 81 mg by mouth daily.    . benzonatate (TESSALON PERLES) 100 MG capsule Take 1 capsule (100 mg total) by mouth every 8 (eight) hours as needed for cough. 30 capsule 0  . cholecalciferol (VITAMIN D) 1000 UNITS tablet Take 1,000 Units by mouth daily.    . clopidogrel (PLAVIX) 75 MG tablet Take 1 tablet (75 mg total) by mouth daily. 30 tablet 1  . diphenhydrAMINE (BENADRYL) 25 MG tablet Take 25 mg by mouth every 6 (six) hours as needed for allergies.     Marland Kitchen escitalopram (LEXAPRO) 20 MG tablet Take 20 mg by mouth daily.    Marland Kitchen HYDROcodone-acetaminophen (NORCO) 5-325 MG tablet Take 1 tablet by mouth every 6 (six) hours as needed for moderate pain. 15 tablet 0  . levothyroxine (SYNTHROID, LEVOTHROID) 100 MCG tablet Take 1 tablet (100 mcg total) by mouth daily. 30 tablet 11  . lidocaine-prilocaine (EMLA) cream Apply 1 application topically as needed (prior to accessing port).    . Multiple Vitamin (MULTIVITAMIN WITH MINERALS) TABS tablet Take 1 tablet by mouth daily.    . naproxen sodium (ANAPROX) 220 MG tablet Take 440 mg by mouth 2 (two) times daily as needed (for pain).    . ranitidine (ZANTAC) 150 MG tablet Take 300 mg by mouth at bedtime.    . vitamin B-12 (CYANOCOBALAMIN) 1000 MCG tablet Take 1,000 mcg by mouth daily.    Marland Kitchen amoxicillin (AMOXIL) 875 MG tablet Take 1 tablet (875 mg total) by mouth 2 (two) times daily. (Patient not taking: Reported on 02/15/2015) 20 tablet 0  . benzonatate (TESSALON PERLES) 100 MG capsule Take 1 capsule (100 mg total) by mouth every 8 (eight) hours as needed for cough. (Patient not taking: Reported on 02/15/2015) 30 capsule 0  . fluconazole (DIFLUCAN)  150 MG tablet Take 1 tablet (150 mg total) by mouth once. (Patient not taking: Reported on 02/15/2015) 1 tablet 0   No current facility-administered medications for this visit.   Facility-Administered Medications Ordered in Other Visits  Medication Dose Route Frequency Provider Last Rate Last Dose  . heparin lock flush 100 unit/mL  500 Units Intravenous Once Lloyd Huger, MD      . sodium chloride 0.9 % injection 10 mL  10 mL Intracatheter PRN Lloyd Huger, MD   10 mL at 11/30/14 1205    OBJECTIVE: Filed Vitals:   02/15/15 0914  BP: 141/60  Pulse: 74  Temp: 97.9 F (36.6 C)  Resp: 18     Body mass index is 38.43 kg/(m^2).    ECOG FS:1 - Symptomatic but completely ambulatory  General: Well-developed, well-nourished, no acute distress. Eyes: Pink conjunctiva, anicteric sclera. Breasts: Exam deferred today. Lungs: Clear to auscultation bilaterally. Heart: Regular rate and rhythm. No rubs, murmurs, or gallops. Abdomen: Soft, nontender, nondistended. No organomegaly noted, normoactive bowel sounds. Musculoskeletal: No edema, cyanosis, or clubbing. Neuro: Alert, answering all questions appropriately. Cranial nerves grossly intact. Skin: No rashes or petechiae noted. Psych: Normal affect.   LAB RESULTS:  Lab Results  Component Value Date   NA 136 02/15/2015   K 3.6 02/15/2015   CL 104 02/15/2015   CO2 26 02/15/2015   GLUCOSE 101* 02/15/2015   BUN 16 02/15/2015   CREATININE 0.99 02/15/2015   CALCIUM 9.0 02/15/2015   PROT 7.0 02/15/2015   ALBUMIN 3.7 02/15/2015   AST 20 02/15/2015   ALT 17 02/15/2015   ALKPHOS 85 02/15/2015   BILITOT 0.8 02/15/2015   GFRNONAA >60 02/15/2015   GFRAA >60 02/15/2015    Lab Results  Component Value Date   WBC 5.9 02/15/2015   NEUTROABS 3.1 02/15/2015   HGB 11.0* 02/15/2015   HCT 33.0* 02/15/2015   MCV 89.6 02/15/2015   PLT 236 02/15/2015     STUDIES: Dg Chest 2 View  01/24/2015  CLINICAL DATA:  Onset of a burning chest  sensation and began 1 lower ago with pain radiating down both arms ; history of MI in April 2016, history of pericarditis and breast malignancy EXAM: CHEST  2 VIEW COMPARISON:  Portable chest x-ray of August 30, 2014 FINDINGS: The lungs are adequately inflated and clear. The heart and pulmonary vascularity are normal. The mediastinum is normal in width. There is no pleural effusion. The Port-A-Cath appliance tip projects at the junction of the middle and distal thirds of the SVC. The bony structures are unremarkable. IMPRESSION: There is no active cardiopulmonary disease. Electronically Signed   By: David  Martinique M.D.   On: 01/24/2015 16:48   Nm Cardiac Muga Rest  02/11/2015  CLINICAL DATA:  Breast cancer, pre chemotherapy EXAM: NUCLEAR MEDICINE CARDIAC BLOOD POOL IMAGING (MUGA) TECHNIQUE: Cardiac multi-gated acquisition was performed at rest following intravenous injection of Tc-53mlabeled red blood cells. RADIOPHARMACEUTICALS:  18.65 mCi [id number] labeled autologous red cells IV COMPARISON:  12/10/2014 FINDINGS: LEFT ventricular ejection fraction is calculated at 53%. This is not significantly changed from the 52% on the previous exam. Study was obtained at a heart rate of 66 bpm. Cine analysis of the LEFT ventricle in 3 projections demonstrates normal LEFT ventricular wall motion. Patient was rhythmic during image acquisition. IMPRESSION: Normal LEFT ventricular ejection fraction of 53% unchanged since 12/10/2014. Normal LEFT ventricular wall motion. Electronically Signed   By: MaLavonia Dana.D.   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  PLAN:    1. Breast cancer: Given the fact that patient is HER-2 positive, she will benefit from adjuvant chemotherapy. Patient has now completed MammoSite radiation. Patient's MUGA scan from February 11, 2015 was 53% which is unchanged from her previous EF of 52%.  Given patient's  persistent side effects, chemotherapy was been discontinued altogether after 3 cycles. Proceed with cycle 7 of 18 of Herceptin only today. Return to clinic in 3 weeks for consideration of cycle 8. She does not require an aromatase inhibitor given the fact that  her tumor is ER/PR negative.   2. Diarrhea: Resolved. Continue Lomotil as prescribed. 3. Dizziness: Resolved. Consider head CT in the future if it does not improve. 4. Anemia: Mild, monitor.  Patient expressed understanding and was in agreement with this plan. She also understands that She can call clinic at any time with any questions, concerns, or complaints.   Breast cancer, female   Staging form: Breast, AJCC 7th Edition     Pathologic stage from 08/17/2014: Stage IA (T1c, N0, cM0) - Signed by Lloyd Huger, MD on 08/17/2014   Mayra Reel, NP   02/15/2015 9:28 AM   Patient was seen and evaluated independently and I agree with the assessment and plan as dictated above.  Lloyd Huger, MD 02/16/2015 5:37 AM

## 2015-02-22 ENCOUNTER — Encounter: Payer: Self-pay | Admitting: *Deleted

## 2015-02-22 NOTE — Progress Notes (Signed)
  Oncology Nurse Navigator Documentation  Navigator Location: [facility] (02/22/15 1100) Navigator Encounter Type: Other (Card) (02/22/15 1100)           Patient Visit Type: Other (02/22/15 1100)                              Time Spent with Patient: 15 (02/22/15 1100)   Thinking of you card mailed to patient.

## 2015-03-08 ENCOUNTER — Inpatient Hospital Stay: Payer: Medicaid Other | Attending: Oncology

## 2015-03-08 ENCOUNTER — Inpatient Hospital Stay (HOSPITAL_BASED_OUTPATIENT_CLINIC_OR_DEPARTMENT_OTHER): Payer: Medicaid Other | Admitting: Oncology

## 2015-03-08 ENCOUNTER — Inpatient Hospital Stay: Payer: Medicaid Other

## 2015-03-08 VITALS — BP 141/57 | HR 73 | Temp 97.6°F | Resp 18 | Wt 228.4 lb

## 2015-03-08 DIAGNOSIS — Z171 Estrogen receptor negative status [ER-]: Secondary | ICD-10-CM | POA: Diagnosis not present

## 2015-03-08 DIAGNOSIS — Z800 Family history of malignant neoplasm of digestive organs: Secondary | ICD-10-CM | POA: Diagnosis not present

## 2015-03-08 DIAGNOSIS — Z808 Family history of malignant neoplasm of other organs or systems: Secondary | ICD-10-CM | POA: Insufficient documentation

## 2015-03-08 DIAGNOSIS — E039 Hypothyroidism, unspecified: Secondary | ICD-10-CM | POA: Diagnosis not present

## 2015-03-08 DIAGNOSIS — E669 Obesity, unspecified: Secondary | ICD-10-CM

## 2015-03-08 DIAGNOSIS — Z79899 Other long term (current) drug therapy: Secondary | ICD-10-CM | POA: Insufficient documentation

## 2015-03-08 DIAGNOSIS — C50919 Malignant neoplasm of unspecified site of unspecified female breast: Secondary | ICD-10-CM

## 2015-03-08 DIAGNOSIS — Z803 Family history of malignant neoplasm of breast: Secondary | ICD-10-CM

## 2015-03-08 DIAGNOSIS — C50912 Malignant neoplasm of unspecified site of left female breast: Secondary | ICD-10-CM

## 2015-03-08 DIAGNOSIS — F329 Major depressive disorder, single episode, unspecified: Secondary | ICD-10-CM | POA: Diagnosis not present

## 2015-03-08 DIAGNOSIS — G4730 Sleep apnea, unspecified: Secondary | ICD-10-CM | POA: Diagnosis not present

## 2015-03-08 DIAGNOSIS — I319 Disease of pericardium, unspecified: Secondary | ICD-10-CM | POA: Insufficient documentation

## 2015-03-08 DIAGNOSIS — Z5112 Encounter for antineoplastic immunotherapy: Secondary | ICD-10-CM | POA: Insufficient documentation

## 2015-03-08 DIAGNOSIS — I10 Essential (primary) hypertension: Secondary | ICD-10-CM | POA: Insufficient documentation

## 2015-03-08 DIAGNOSIS — Z7982 Long term (current) use of aspirin: Secondary | ICD-10-CM | POA: Insufficient documentation

## 2015-03-08 DIAGNOSIS — Z8669 Personal history of other diseases of the nervous system and sense organs: Secondary | ICD-10-CM | POA: Insufficient documentation

## 2015-03-08 DIAGNOSIS — G4700 Insomnia, unspecified: Secondary | ICD-10-CM | POA: Insufficient documentation

## 2015-03-08 DIAGNOSIS — I252 Old myocardial infarction: Secondary | ICD-10-CM

## 2015-03-08 LAB — CBC WITH DIFFERENTIAL/PLATELET
BASOS ABS: 0.1 10*3/uL (ref 0–0.1)
BASOS PCT: 2 %
Eosinophils Absolute: 0.4 10*3/uL (ref 0–0.7)
Eosinophils Relative: 7 %
HEMATOCRIT: 31.7 % — AB (ref 35.0–47.0)
Hemoglobin: 10.6 g/dL — ABNORMAL LOW (ref 12.0–16.0)
LYMPHS PCT: 32 %
Lymphs Abs: 1.7 10*3/uL (ref 1.0–3.6)
MCH: 29.6 pg (ref 26.0–34.0)
MCHC: 33.5 g/dL (ref 32.0–36.0)
MCV: 88.5 fL (ref 80.0–100.0)
Monocytes Absolute: 0.6 10*3/uL (ref 0.2–0.9)
Monocytes Relative: 11 %
NEUTROS ABS: 2.7 10*3/uL (ref 1.4–6.5)
NEUTROS PCT: 48 %
Platelets: 193 10*3/uL (ref 150–440)
RBC: 3.58 MIL/uL — AB (ref 3.80–5.20)
RDW: 14.5 % (ref 11.5–14.5)
WBC: 5.5 10*3/uL (ref 3.6–11.0)

## 2015-03-08 LAB — COMPREHENSIVE METABOLIC PANEL
ALBUMIN: 3.8 g/dL (ref 3.5–5.0)
ALT: 18 U/L (ref 14–54)
AST: 21 U/L (ref 15–41)
Alkaline Phosphatase: 78 U/L (ref 38–126)
Anion gap: 3 — ABNORMAL LOW (ref 5–15)
BILIRUBIN TOTAL: 1.1 mg/dL (ref 0.3–1.2)
BUN: 15 mg/dL (ref 6–20)
CHLORIDE: 108 mmol/L (ref 101–111)
CO2: 28 mmol/L (ref 22–32)
CREATININE: 0.99 mg/dL (ref 0.44–1.00)
Calcium: 8.8 mg/dL — ABNORMAL LOW (ref 8.9–10.3)
GFR calc Af Amer: >60 mL/min (ref >60)
GLUCOSE: 95 mg/dL (ref 65–99)
POTASSIUM: 3.5 mmol/L (ref 3.5–5.1)
Sodium: 139 mmol/L (ref 135–145)
TOTAL PROTEIN: 6.5 g/dL (ref 6.5–8.1)

## 2015-03-08 MED ORDER — SODIUM CHLORIDE 0.9 % IV SOLN
Freq: Once | INTRAVENOUS | Status: AC
  Administered 2015-03-08: 11:00:00 via INTRAVENOUS
  Filled 2015-03-08: qty 1000

## 2015-03-08 MED ORDER — TRASTUZUMAB CHEMO INJECTION 440 MG
6.0000 mg/kg | Freq: Once | INTRAVENOUS | Status: AC
  Administered 2015-03-08: 651 mg via INTRAVENOUS
  Filled 2015-03-08: qty 31

## 2015-03-08 MED ORDER — HEPARIN SOD (PORK) LOCK FLUSH 100 UNIT/ML IV SOLN
500.0000 [IU] | Freq: Once | INTRAVENOUS | Status: AC | PRN
  Administered 2015-03-08: 500 [IU]
  Filled 2015-03-08: qty 5

## 2015-03-08 MED ORDER — ACETAMINOPHEN 325 MG PO TABS
650.0000 mg | ORAL_TABLET | Freq: Once | ORAL | Status: AC
  Administered 2015-03-08: 650 mg via ORAL
  Filled 2015-03-08: qty 2

## 2015-03-08 MED ORDER — SODIUM CHLORIDE 0.9 % IJ SOLN
10.0000 mL | INTRAMUSCULAR | Status: DC | PRN
  Administered 2015-03-08: 10 mL
  Filled 2015-03-08: qty 10

## 2015-03-08 MED ORDER — DIPHENHYDRAMINE HCL 25 MG PO CAPS
50.0000 mg | ORAL_CAPSULE | Freq: Once | ORAL | Status: AC
  Administered 2015-03-08: 50 mg via ORAL
  Filled 2015-03-08: qty 2

## 2015-03-08 NOTE — Progress Notes (Signed)
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  Chief Complaint  Patient presents with  . Breast Cancer    INTERVAL HISTORY:  Patient returns to clinic today for further evaluation and consideration of cycle 8 of Herceptin only. Patient feels well today. She does have pain in her right foot but it is unrelated to her treatment here. She sees a podiatrist this week. She does not have any further diarrhea. She has no neurologic complaints. She denies any recent fevers or illnesses. Her appetite is improved and her weight is stable. She has no chest pain or shortness of breath. She has no urinary complaints. Patient offers no specific complaints today.  REVIEW OF SYSTEMS:   Review of Systems  Constitutional: Negative.  Negative for fever and malaise/fatigue.  HENT: Negative.   Respiratory: Negative.   Cardiovascular: Negative.   Gastrointestinal: Negative for nausea, abdominal pain and diarrhea.  Musculoskeletal: Negative for back pain.  Neurological: Negative for weakness.  Psychiatric/Behavioral: Negative.     As per HPI. Otherwise, a complete review of systems is negatve.  PAST MEDICAL HISTORY: Past Medical History  Diagnosis Date  . Major depressive disorder, recurrent ([place])   . HTN (hypertension)   . Overactive bladder   . Migraines   . Gastritis   . Hypothyroidism   . OSA (obstructive sleep apnea)     untreated due to lack of insurance  . Insomnia   . GERD (gastroesophageal reflux disease)   . Pericarditis 1997  . Detached retina 1997  . Chronic headache   . Morbid obesity ([place])   . NSTEMI (non-ST  elevated myocardial infarction) ([place]) 04/16/14    Due to demand- normal Cath 04/19/14  . Breast cancer ([place])   . Bronchitis 04-2014  . Shortness of breath dyspnea     with exertion only  . Anemia     h/o  . Pericarditis 1997  . Family history of adverse reaction to anesthesia     pts dad has pseudocholinestrace deficieny    PAST SURGICAL HISTORY: Past Surgical History  Procedure Laterality Date  . Back surgery      L4-5  . Knee surgery    . Cesarean section    . Coronary angioplasty  April 2016  . Retinal detachment surgery Right 1997  . Foot surgery    . Breast lumpectomy with sentinel lymph node biopsy Left 08/13/2014    Procedure: BREAST LUMPECTOMY WITH SENTINEL LYMPH NODE BIOPSY;  Surgeon: Robert Bellow, MD;  Location: [facility] ORS;  Service: General;  Laterality: Left;  . Breast mammosite Left 08/30/2014    Procedure: MAMMOSITE BREAST;  Surgeon: Robert Bellow, MD;  Location: [facility] ORS;  Service: General;  Laterality: Left;  . Portacath placement Right 08/30/2014    Procedure: INSERTION PORT-A-CATH;  Surgeon: Robert Bellow, MD;  Location: [facility] ORS;  Service: General;  Laterality: Right;    FAMILY HISTORY Family History  Problem Relation Age of Onset  . Alcohol abuse Mother   . Thyroid disease Mother   . Hypertension Mother   . COPD Mother   . Cancer Father     liposarcoma  . Congestive Heart Failure Father   .  Depression Father   . Hypertension Father   . Cancer Maternal Uncle     Pancreatic cancer  . Heart disease Paternal Grandfather 50    MI  . Diabetes Other   . Cancer Sister     breast  . Breast cancer Sister 54       ADVANCED DIRECTIVES:    HEALTH MAINTENANCE: Social History  Substance Use Topics  . Smoking status: Never Smoker   . Smokeless tobacco: Never Used  . Alcohol Use: No     Colonoscopy:  PAP:  Bone density:  Lipid panel:  Allergies  Allergen Reactions  . Reglan [Metoclopramide] Shortness Of Breath and Other (See Comments)     This medication caused a dystonic reaction.    . Paxil [Paroxetine Hcl] Other (See Comments)    Reaction:  Blurred vision  . Tegaderm Ag Mesh [Silver] Rash  . Ultram [Tramadol] Itching and Rash    Current Outpatient Prescriptions  Medication Sig Dispense Refill  . acetaminophen (TYLENOL) 500 MG tablet Take 1,000 mg by mouth every 6 (six) hours as needed for mild pain or headache.    . aspirin EC 81 MG tablet Take 81 mg by mouth daily.    . benzonatate (TESSALON PERLES) 100 MG capsule Take 1 capsule (100 mg total) by mouth every 8 (eight) hours as needed for cough. 30 capsule 0  . cholecalciferol (VITAMIN D) 1000 UNITS tablet Take 1,000 Units by mouth daily.    . clopidogrel (PLAVIX) 75 MG tablet Take 1 tablet (75 mg total) by mouth daily. 30 tablet 1  . diphenhydrAMINE (BENADRYL) 25 MG tablet Take 25 mg by mouth every 6 (six) hours as needed for allergies.     . escitalopram (LEXAPRO) 20 MG tablet Take 20 mg by mouth daily.    . HYDROcodone-acetaminophen (NORCO) 5-325 MG tablet Take 1 tablet by mouth every 6 (six) hours as needed for moderate pain. 15 tablet 0  . levothyroxine (SYNTHROID, LEVOTHROID) 100 MCG tablet Take 1 tablet (100 mcg total) by mouth daily. 30 tablet 11  . lidocaine-prilocaine (EMLA) cream Apply 1 application topically as needed (prior to accessing port).    . Multiple Vitamin (MULTIVITAMIN WITH MINERALS) TABS tablet Take 1 tablet by mouth daily.    . naproxen sodium (ANAPROX) 220 MG tablet Take 440 mg by mouth 2 (two) times daily as needed (for pain).    . ranitidine (ZANTAC) 150 MG tablet Take 300 mg by mouth at bedtime.    . simvastatin (ZOCOR) 10 MG tablet Take 10 mg by mouth daily.    . vitamin B-12 (CYANOCOBALAMIN) 1000 MCG tablet Take 1,000 mcg by mouth daily.     No current facility-administered medications for this visit.   Facility-Administered Medications Ordered in Other Visits  Medication Dose Route Frequency Provider Last Rate Last Dose  . sodium  chloride 0.9 % injection 10 mL  10 mL Intracatheter PRN Timothy J Finnegan, MD   10 mL at 11/30/14 1205    OBJECTIVE: Filed Vitals:   03/08/15 0950  BP: 141/57  Pulse: 73  Temp: 97.6 F (36.4 C)  Resp: 18     Body mass index is 38.61 kg/(m^2).    ECOG FS:1 - Symptomatic but completely ambulatory  General: Well-developed, well-nourished, no acute distress. Eyes: Pink conjunctiva, anicteric sclera. Breasts: Exam deferred today. Lungs: Clear to auscultation bilaterally. Heart: Regular rate and rhythm. No rubs, murmurs, or gallops. Abdomen: Soft, nontender, nondistended. No organomegaly noted, normoactive bowel sounds. Musculoskeletal: No edema, cyanosis, or   clubbing. Neuro: Alert, answering all questions appropriately. Cranial nerves grossly intact. Skin: No rashes or petechiae noted. Psych: Normal affect.   LAB RESULTS:  Lab Results  Component Value Date   NA 139 03/08/2015   K 3.5 03/08/2015   CL 108 03/08/2015   CO2 28 03/08/2015   GLUCOSE 95 03/08/2015   BUN 15 03/08/2015   CREATININE 0.99 03/08/2015   CALCIUM 8.8* 03/08/2015   PROT 6.5 03/08/2015   ALBUMIN 3.8 03/08/2015   AST 21 03/08/2015   ALT 18 03/08/2015   ALKPHOS 78 03/08/2015   BILITOT 1.1 03/08/2015   GFRNONAA >60 03/08/2015   GFRAA >60 03/08/2015    Lab Results  Component Value Date   WBC 5.5 03/08/2015   NEUTROABS 2.7 03/08/2015   HGB 10.6* 03/08/2015   HCT 31.7* 03/08/2015   MCV 88.5 03/08/2015   PLT 193 03/08/2015     STUDIES: Nm Cardiac Muga Rest  02/11/2015  CLINICAL DATA:  Breast cancer, pre chemotherapy EXAM: NUCLEAR MEDICINE CARDIAC BLOOD POOL IMAGING (MUGA) TECHNIQUE: Cardiac multi-gated acquisition was performed at rest following intravenous injection of Tc-99m labeled red blood cells. RADIOPHARMACEUTICALS:  18.65 mCi Tc-99m pertechnetate labeled autologous red cells IV COMPARISON:  12/10/2014 FINDINGS: LEFT ventricular ejection fraction is calculated at 53%. This is not significantly  changed from the 52% on the previous exam. Study was obtained at a heart rate of 66 bpm. Cine analysis of the LEFT ventricle in 3 projections demonstrates normal LEFT ventricular wall motion. Patient was rhythmic during image acquisition. IMPRESSION: Normal LEFT ventricular ejection fraction of 53% unchanged since 12/10/2014. Normal LEFT ventricular wall motion. Electronically Signed   By: Mark  Boles M.D.   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  PLAN:    1. Breast cancer: Given the fact that patient is HER-2 positive, she will benefit from adjuvant chemotherapy. Patient completed MammoSite radiation. Patient's MUGA scan from February 11, 2015 was 53% which is unchanged from her previous EF of 52%.  Given patient's persistent side effects, chemotherapy was discontinued altogether after 3 cycles. Proceed with cycle 8 of 18 of Herceptin only today. Return to clinic in 3 weeks for consideration of cycle 9. She does not require an aromatase inhibitor given the fact that her tumor is ER/PR negative.   2. Anemia: Mild, monitor.  Patient expressed understanding and was in agreement with this plan. She also understands that She can call clinic at any time with any questions, concerns, or complaints.   Breast cancer, female   Staging form: Breast, AJCC 7th Edition     Pathologic stage from 08/17/2014: Stage IA (T1c, N0, cM0) - Signed by Timothy J Finnegan, MD on 08/17/2014   Robin Amelung, NP   03/08/2015 10:16 AM   Patient was seen and evaluated independently and I agree with the assessment and plan as dictated above.  Timothy J Finnegan, MD 03/12/2015 7:38 AM             

## 2015-03-08 NOTE — Progress Notes (Signed)
Right foot pain at the heel and is seeing podiatry tomorrow.

## 2015-03-15 ENCOUNTER — Ambulatory Visit (INDEPENDENT_AMBULATORY_CARE_PROVIDER_SITE_OTHER): Payer: Medicaid Other | Admitting: General Surgery

## 2015-03-15 ENCOUNTER — Encounter: Payer: Self-pay | Admitting: General Surgery

## 2015-03-15 VITALS — BP 154/90 | HR 64 | Resp 12 | Ht 64.5 in | Wt 228.0 lb

## 2015-03-15 DIAGNOSIS — C50912 Malignant neoplasm of unspecified site of left female breast: Secondary | ICD-10-CM

## 2015-03-15 NOTE — Patient Instructions (Addendum)
The patient is aware to call back for any questions or concerns. Follow up 6 months with bilateral diagnostic mammograms

## 2015-03-15 NOTE — Progress Notes (Signed)
Patient ID: Robin Howard, female   DOB: February 20, 1960, 55 y.o.   MRN: [id number]  Chief Complaint  Patient presents with  . Follow-up    HPI Robin Howard is a 55 y.o. female.  Here today for follow up left breast nodule and breast cancer. She did have a NSTEMI January 24 2015. She does admit to random occasional left breast tender.  The patient reports she is appreciated decrease in volume and the nodular area in the areolar mass on the left.  I personally reviewed the patient's history.  HPI  Past Medical History  Diagnosis Date  . Major depressive disorder, recurrent ([place])   . HTN (hypertension)   . Overactive bladder   . Migraines   . Gastritis   . Hypothyroidism   . OSA (obstructive sleep apnea)     untreated due to lack of insurance  . Insomnia   . GERD (gastroesophageal reflux disease)   . Pericarditis 1997  . Detached retina 1997  . Chronic headache   . Morbid obesity ([place])   . Breast cancer ([place])   . Bronchitis 04-2014  . Shortness of breath dyspnea     with exertion only  . Anemia     h/o  . Pericarditis 1997  . Family history of adverse reaction to anesthesia     pts dad has pseudocholinestrace deficieny  . NSTEMI (non-ST elevated myocardial infarction) ([place]) 04/16/14    Due to demand- normal Cath 04/19/14  . NSTEMI (non-ST elevated myocardial infarction) ([place]) 01-24-15    Past Surgical History  Procedure Laterality Date  . Back surgery      L4-5  . Knee surgery    . Cesarean section    . Coronary angioplasty  April 2016  . Retinal detachment surgery Right 1997  . Foot surgery    . Breast lumpectomy with sentinel lymph node biopsy Left 08/13/2014    Procedure: BREAST LUMPECTOMY WITH SENTINEL LYMPH NODE BIOPSY;  Surgeon: Robert Bellow, MD;  Location: [facility] ORS;  Service: General;  Laterality: Left;  . Breast mammosite Left 08/30/2014    Procedure: MAMMOSITE BREAST;  Surgeon: Robert Bellow, MD;  Location: [facility] ORS;  Service: General;  Laterality: Left;  .  Portacath placement Right 08/30/2014    Procedure: INSERTION PORT-A-CATH;  Surgeon: Robert Bellow, MD;  Location: [facility] ORS;  Service: General;  Laterality: Right;    Family History  Problem Relation Age of Onset  . Alcohol abuse Mother   . Thyroid disease Mother   . Hypertension Mother   . COPD Mother   . Cancer Father     liposarcoma  . Congestive Heart Failure Father     V Tach  . Depression Father   . Hypertension Father   . Cancer Maternal Uncle     Pancreatic cancer  . Heart disease Paternal Grandfather 50    MI  . Diabetes Other   . Cancer Sister     breast  . Breast cancer Sister 38    Social History Social History  Substance Use Topics  . Smoking status: Never Smoker   . Smokeless tobacco: Never Used  . Alcohol Use: No    Allergies  Allergen Reactions  . Reglan [Metoclopramide] Shortness Of Breath and Other (See Comments)    This medication caused a dystonic reaction.    Marland Kitchen Paxil [Paroxetine Hcl] Other (See Comments)    Reaction:  Blurred vision  . Tegaderm Ag Mesh [Silver] Rash  . Ultram [Tramadol] Itching and  Rash    Current Outpatient Prescriptions  Medication Sig Dispense Refill  . acetaminophen (TYLENOL) 500 MG tablet Take 1,000 mg by mouth every 6 (six) hours as needed for mild pain or headache.    Marland Kitchen aspirin EC 81 MG tablet Take 81 mg by mouth daily.    . cholecalciferol (VITAMIN D) 1000 UNITS tablet Take 1,000 Units by mouth daily.    . clopidogrel (PLAVIX) 75 MG tablet Take 1 tablet (75 mg total) by mouth daily. 30 tablet 1  . diphenhydrAMINE (BENADRYL) 25 MG tablet Take 25 mg by mouth every 6 (six) hours as needed for allergies.     Marland Kitchen escitalopram (LEXAPRO) 20 MG tablet Take 20 mg by mouth daily.    Marland Kitchen HYDROcodone-acetaminophen (NORCO) 5-325 MG tablet Take 1 tablet by mouth every 6 (six) hours as needed for moderate pain. 15 tablet 0  . levothyroxine (SYNTHROID, LEVOTHROID) 100 MCG tablet Take 1 tablet (100 mcg total) by mouth daily. 30 tablet 11   . lidocaine-prilocaine (EMLA) cream Apply 1 application topically as needed (prior to accessing port).    . Multiple Vitamin (MULTIVITAMIN WITH MINERALS) TABS tablet Take 1 tablet by mouth daily.    . ranitidine (ZANTAC) 150 MG tablet Take 300 mg by mouth at bedtime.    . simvastatin (ZOCOR) 10 MG tablet Take 10 mg by mouth daily.    . vitamin B-12 (CYANOCOBALAMIN) 1000 MCG tablet Take 1,000 mcg by mouth daily.     No current facility-administered medications for this visit.   Facility-Administered Medications Ordered in Other Visits  Medication Dose Route Frequency Provider Last Rate Last Dose  . sodium chloride 0.9 % injection 10 mL  10 mL Intracatheter PRN Lloyd Huger, MD   10 mL at 11/30/14 1205    Review of Systems Review of Systems  Constitutional: Negative.   Respiratory: Negative.   Cardiovascular: Negative.     Blood pressure 154/90, pulse 64, resp. rate 12, height 5' 4.5" (1.638 m), weight 228 lb (103.42 kg).  Physical Exam Physical Exam  Constitutional: She is oriented to person, place, and time. She appears well-developed and well-nourished.  HENT:  Mouth/Throat: Oropharynx is clear and moist.  Eyes: Conjunctivae are normal. No scleral icterus.  Neck: Neck supple.  Cardiovascular: Normal rate, regular rhythm and normal heart sounds.   Pulmonary/Chest: Effort normal and breath sounds normal. Right breast exhibits no inverted nipple, no mass, no nipple discharge, no skin change and no tenderness. Left breast exhibits mass. Left breast exhibits no inverted nipple, no nipple discharge, no skin change and no tenderness.    Well healed incision left breast at 5 o'clock. Nodule at 3 o'clock left breast unchanged from before.   Lymphadenopathy:    She has no cervical adenopathy.    She has no axillary adenopathy.  Neurological: She is alert and oriented to person, place, and time.  Skin: Skin is warm and dry.  Psychiatric: Her behavior is normal.       Assessment    Doing well status post breast wide excision, partial resolution of nodular density at the edge of the radiation field.    Plan    With the significant improvement over the last 3 months we'll plan for a repeat exam in 6 months with mammograms at that time. The patient was encouraged to continue monthly self exams and report any significant change for early evaluation.    Follow up 6 months with bilateral diagnostic mammograms.   PCP:  Enid Derry This information  has been scribed by Karie Fetch RNBC.   Robert Bellow 03/16/2015, 7:39 PM

## 2015-03-16 ENCOUNTER — Emergency Department
Admission: EM | Admit: 2015-03-16 | Discharge: 2015-03-17 | Disposition: A | Discharge status: Home / Self Care | Payer: Medicaid Other | Attending: Emergency Medicine | Admitting: Emergency Medicine

## 2015-03-16 DIAGNOSIS — Z79899 Other long term (current) drug therapy: Secondary | ICD-10-CM | POA: Insufficient documentation

## 2015-03-16 DIAGNOSIS — G43809 Other migraine, not intractable, without status migrainosus: Secondary | ICD-10-CM

## 2015-03-16 DIAGNOSIS — R51 Headache: Secondary | ICD-10-CM | POA: Diagnosis present

## 2015-03-16 DIAGNOSIS — Z7902 Long term (current) use of antithrombotics/antiplatelets: Secondary | ICD-10-CM | POA: Insufficient documentation

## 2015-03-16 DIAGNOSIS — I10 Essential (primary) hypertension: Secondary | ICD-10-CM | POA: Insufficient documentation

## 2015-03-16 DIAGNOSIS — Z7982 Long term (current) use of aspirin: Secondary | ICD-10-CM | POA: Diagnosis not present

## 2015-03-16 DIAGNOSIS — R112 Nausea with vomiting, unspecified: Secondary | ICD-10-CM

## 2015-03-16 LAB — BASIC METABOLIC PANEL
ANION GAP: 8 (ref 5–15)
BUN: 20 mg/dL (ref 6–20)
CALCIUM: 9.1 mg/dL (ref 8.9–10.3)
CO2: 28 mmol/L (ref 22–32)
Chloride: 105 mmol/L (ref 101–111)
Creatinine, Ser: 0.89 mg/dL (ref 0.44–1.00)
GFR calc Af Amer: >60 mL/min (ref >60)
GFR calc non Af Amer: >60 mL/min (ref >60)
GLUCOSE: 108 mg/dL — AB (ref 65–99)
POTASSIUM: 3.6 mmol/L (ref 3.5–5.1)
Sodium: 141 mmol/L (ref 135–145)

## 2015-03-16 LAB — CBC
HEMATOCRIT: 29.9 % — AB (ref 35.0–47.0)
Hemoglobin: 10.1 g/dL — ABNORMAL LOW (ref 12.0–16.0)
MCH: 29.9 pg (ref 26.0–34.0)
MCHC: 33.7 g/dL (ref 32.0–36.0)
MCV: 88.7 fL (ref 80.0–100.0)
Platelets: 198 10*3/uL (ref 150–440)
RBC: 3.37 MIL/uL — ABNORMAL LOW (ref 3.80–5.20)
RDW: 14.3 % (ref 11.5–14.5)
WBC: 7.4 10*3/uL (ref 3.6–11.0)

## 2015-03-16 MED ORDER — HYDROMORPHONE HCL 1 MG/ML IJ SOLN
1.0000 mg | Freq: Once | INTRAMUSCULAR | Status: AC
  Administered 2015-03-16: 1 mg via INTRAVENOUS
  Filled 2015-03-16: qty 1

## 2015-03-16 MED ORDER — ONDANSETRON HCL 4 MG/2ML IJ SOLN
4.0000 mg | Freq: Once | INTRAMUSCULAR | Status: AC
  Administered 2015-03-16: 4 mg via INTRAVENOUS
  Filled 2015-03-16: qty 2

## 2015-03-16 MED ORDER — SODIUM CHLORIDE 0.9 % IV BOLUS (SEPSIS)
1000.0000 mL | Freq: Once | INTRAVENOUS | Status: AC
  Administered 2015-03-16: 1000 mL via INTRAVENOUS

## 2015-03-16 MED ORDER — DIPHENHYDRAMINE HCL 50 MG/ML IJ SOLN
12.5000 mg | Freq: Once | INTRAMUSCULAR | Status: AC
  Administered 2015-03-16: 12.5 mg via INTRAVENOUS
  Filled 2015-03-16: qty 1

## 2015-03-16 MED ORDER — ONDANSETRON HCL 4 MG/2ML IJ SOLN
4.0000 mg | Freq: Once | INTRAMUSCULAR | Status: AC
  Administered 2015-03-17: 4 mg via INTRAVENOUS
  Filled 2015-03-16: qty 2

## 2015-03-16 MED ORDER — KETOROLAC TROMETHAMINE 30 MG/ML IJ SOLN
30.0000 mg | Freq: Once | INTRAMUSCULAR | Status: AC
  Administered 2015-03-16: 30 mg via INTRAVENOUS
  Filled 2015-03-16: qty 1

## 2015-03-16 NOTE — ED Notes (Signed)
Patient with onset of headache last night, woke her up and she couldn't get back to sleep. Today she has started to vomit and had a nose bleed and cannot keep liquids down. Phono and photosensitive. Drove herself but states she can get a ride should she need to.

## 2015-03-16 NOTE — ED Notes (Signed)
Port accessed using sterile procedure. RN Delilah Shan, RN Helene Kelp, RN Jolissa Kapral at bedside. Patient tolerated well. Labs obtained.

## 2015-03-16 NOTE — Discharge Instructions (Signed)
Please drink plenty of fluid to stay well hydrated. Please get plenty of rest.  Return to the emergency department if you develop severe headache, numbness tingling or weakness, inability to keep down fluids, fever, or any other symptoms concerning to you.  Migraine Headache A migraine headache is very bad, throbbing pain on one or both sides of your head. Talk to your doctor about what things may bring on (trigger) your migraine headaches. HOME CARE  Only take medicines as told by your doctor.  Lie down in a dark, quiet room when you have a migraine.  Keep a journal to find out if certain things bring on migraine headaches. For example, write down:  What you eat and drink.  How much sleep you get.  Any change to your diet or medicines.  Lessen how much alcohol you drink.  Quit smoking if you smoke.  Get enough sleep.  Lessen any stress in your life.  Keep lights dim if bright lights bother you or make your migraines worse. GET HELP RIGHT AWAY IF:   Your migraine becomes really bad.  You have a fever.  You have a stiff neck.  You have trouble seeing.  Your muscles are weak, or you lose muscle control.  You lose your balance or have trouble walking.  You feel like you will pass out (faint), or you pass out.  You have really bad symptoms that are different than your first symptoms. MAKE SURE YOU:   Understand these instructions.  Will watch your condition.  Will get help right away if you are not doing well or get worse.   This information is not intended to replace advice given to you by your health care provider. Make sure you discuss any questions you have with your health care provider.   Document Released: 10/11/2007 Document Revised: 03/26/2011 Document Reviewed: 09/08/2012 Elsevier Interactive Patient Education [employer].

## 2015-03-16 NOTE — ED Provider Notes (Signed)
[facility] Emergency Department Provider Note  ____________________________________________  Time seen: Approximately 10:37 PM  I have reviewed the triage vital signs and the nursing notes.   HISTORY  Chief Complaint Migraine    HPI Robin Howard is a 55 y.o. female with a history of migrainesCAD status post and STEMI, HTN presenting with headache. Patient states that starting yesterday evening she had the onset of a progressively worsening headache. She tried an oxycodone this morning and it did not help. Around noon today, she developed multiple episodes of nausea and vomiting and is now unable to tolerate any by mouth. She has associated photosensitivity and phonophobia, but denies any visual changes including blurred vision or double vision, numbness tickling or weakness, abdominal pain or diarrhea, fever, chills, urinary symptoms, or recent trauma. No tick bites.   Past Medical History  Diagnosis Date  . Major depressive disorder, recurrent ([place])   . HTN (hypertension)   . Overactive bladder   . Migraines   . Gastritis   . Hypothyroidism   . OSA (obstructive sleep apnea)     untreated due to lack of insurance  . Insomnia   . GERD (gastroesophageal reflux disease)   . Pericarditis 1997  . Detached retina 1997  . Chronic headache   . Morbid obesity ([place])   . Breast cancer ([place])   . Bronchitis 04-2014  . Shortness of breath dyspnea     with exertion only  . Anemia     h/o  . Pericarditis 1997  . Family history of adverse reaction to anesthesia     pts dad has pseudocholinestrace deficieny  . NSTEMI (non-ST elevated myocardial infarction) ([place]) 04/16/14    Due to demand- normal Cath 04/19/14  . NSTEMI (non-ST elevated myocardial infarction) [facility]) 01-24-15    Patient Active Problem List   Diagnosis Date Noted  . NSTEMI (non-ST elevated myocardial infarction) ([place]) 01/25/2015  . Angina pectoris ([place]) 01/24/2015  . Elevated troponin 01/24/2015  .  Breast lump on left side at 3 o'clock position 12/13/2014  . Colitis 09/23/2014  . Breast cancer, female ([place]) 08/10/2014  . Migraine 07/15/2014  . Fatigue 07/15/2014  . Major depressive disorder, recurrent ([place])   . HTN (hypertension)   . Overactive bladder   . Hypothyroidism   . OSA (obstructive sleep apnea)   . GERD (gastroesophageal reflux disease)   . Chronic headache     Past Surgical History  Procedure Laterality Date  . Back surgery      L4-5  . Knee surgery    . Cesarean section    . Coronary angioplasty  April 2016  . Retinal detachment surgery Right 1997  . Foot surgery    . Breast lumpectomy with sentinel lymph node biopsy Left 08/13/2014    Procedure: BREAST LUMPECTOMY WITH SENTINEL LYMPH NODE BIOPSY;  Surgeon: Robert Bellow, MD;  Location: [facility] ORS;  Service: General;  Laterality: Left;  . Breast mammosite Left 08/30/2014    Procedure: MAMMOSITE BREAST;  Surgeon: Robert Bellow, MD;  Location: [facility] ORS;  Service: General;  Laterality: Left;  . Portacath placement Right 08/30/2014    Procedure: INSERTION PORT-A-CATH;  Surgeon: Robert Bellow, MD;  Location: [facility] ORS;  Service: General;  Laterality: Right;    Current Outpatient Rx  Name  Route  Sig  Dispense  Refill  . acetaminophen (TYLENOL) 500 MG tablet   Oral   Take 1,000 mg by mouth every 6 (six) hours as needed for mild pain or  headache.         Marland Kitchen aspirin EC 81 MG tablet   Oral   Take 81 mg by mouth daily.         . cholecalciferol (VITAMIN D) 1000 UNITS tablet   Oral   Take 1,000 Units by mouth daily.         . clopidogrel (PLAVIX) 75 MG tablet   Oral   Take 1 tablet (75 mg total) by mouth daily.   30 tablet   1   . diphenhydrAMINE (BENADRYL) 25 MG tablet   Oral   Take 25 mg by mouth every 6 (six) hours as needed for allergies.          Marland Kitchen escitalopram (LEXAPRO) 20 MG tablet   Oral   Take 20 mg by mouth daily.         Marland Kitchen HYDROcodone-acetaminophen (NORCO) 5-325 MG tablet    Oral   Take 1 tablet by mouth every 6 (six) hours as needed for moderate pain.   15 tablet   0   . levothyroxine (SYNTHROID, LEVOTHROID) 100 MCG tablet   Oral   Take 1 tablet (100 mcg total) by mouth daily.   30 tablet   11   . lidocaine-prilocaine (EMLA) cream   Topical   Apply 1 application topically as needed (prior to accessing port).         . Multiple Vitamin (MULTIVITAMIN WITH MINERALS) TABS tablet   Oral   Take 1 tablet by mouth daily.         . ranitidine (ZANTAC) 150 MG tablet   Oral   Take 300 mg by mouth at bedtime.         . simvastatin (ZOCOR) 10 MG tablet   Oral   Take 10 mg by mouth daily.         . vitamin B-12 (CYANOCOBALAMIN) 1000 MCG tablet   Oral   Take 1,000 mcg by mouth daily.           Allergies Reglan; Paxil; Tegaderm ag mesh; and Ultram  Family History  Problem Relation Age of Onset  . Alcohol abuse Mother   . Thyroid disease Mother   . Hypertension Mother   . COPD Mother   . Cancer Father     liposarcoma  . Congestive Heart Failure Father     V Tach  . Depression Father   . Hypertension Father   . Cancer Maternal Uncle     Pancreatic cancer  . Heart disease Paternal Grandfather 67    MI  . Diabetes Other   . Cancer Sister     breast  . Breast cancer Sister 67    Social History Social History  Substance Use Topics  . Smoking status: Never Smoker   . Smokeless tobacco: Never Used  . Alcohol Use: No    Review of Systems Constitutional: No fever/chills. No lightheadedness or syncope. Eyes: No visual changes. Positive photophobia. Negative blurred vision or double vision. ENT: No sore throat. Cardiovascular: Denies chest pain, palpitations. Respiratory: Denies shortness of breath.  No cough. Gastrointestinal: No abdominal pain.  Positive nausea, positive vomiting.  No diarrhea.  No constipation. Genitourinary: Negative for dysuria. Musculoskeletal: Negative for back pain. Skin: Negative for rash. Neurological:  Positive for headache. Negative for numbness tickling or weakness. Negative for visual changes, speech changes or changes in mental status. Positive for photophobia and photosensitivity.  10-point ROS otherwise negative.  ____________________________________________   PHYSICAL EXAM:  VITAL SIGNS: ED Triage Vitals  Enc Vitals Group     BP 03/16/15 2126 154/98 mmHg     Pulse Rate 03/16/15 2126 69     Resp 03/16/15 2126 18     Temp 03/16/15 2126 98.5 F (36.9 C)     Temp Source 03/16/15 2126 Oral     SpO2 03/16/15 2126 98 %     Weight 03/16/15 2126 227 lb (102.967 kg)     Height 03/16/15 2126 5\' 5"  (1.651 m)     Head Cir --      Peak Flow --      Pain Score 03/16/15 2130 8     Pain Loc --      Pain Edu? --      Excl. in [place]? --     Constitutional: Patient is alert and oriented and answering questions appropriately. She is uncomfortable appearing but nontoxic.  Eyes: Conjunctivae are normal.  EOMI. PERRLA. Head: Atraumatic. Nose: No congestion/rhinnorhea. Mouth/Throat: Mucous membranes are moist.  Neck: No stridor.  Supple.  No JVD. No meningismus. Cardiovascular: Normal rate, regular rhythm. No murmurs, rubs or gallops.  Respiratory: Normal respiratory effort.  No retractions. Lungs CTAB.  No wheezes, rales or ronchi. Gastrointestinal: Obese. Soft and nontender. No distention. No peritoneal signs. Musculoskeletal: No LE edema.  Neurologic:  Patient is alert and oriented 3. Face and smile are symmetric. EOMI and PERRLA. Moves all extremities well.  Skin:  Skin is warm, dry and intact. No rash noted. Psychiatric: Mood and affect are normal. Speech and behavior are normal.  Normal judgement.  ____________________________________________   LABS (all labs ordered are listed, but only abnormal results are displayed)  Labs Reviewed  CBC - Abnormal; Notable for the following:    RBC 3.37 (*)    Hemoglobin 10.1 (*)    HCT 29.9 (*)    All other components within normal limits   BASIC METABOLIC PANEL - Abnormal; Notable for the following:    Glucose, Bld 108 (*)    All other components within normal limits   ____________________________________________  EKG  Not indicated ____________________________________________  RADIOLOGY  No results found.  ____________________________________________   PROCEDURES  Procedure(s) performed: None  Critical Care performed: No ____________________________________________   INITIAL IMPRESSION / ASSESSMENT AND PLAN / ED COURSE  Pertinent labs & imaging results that were available during my care of the patient were reviewed by me and considered in my medical decision making (see chart for details).  55 y.o. female with a history of migraines presenting with a progressively worsening headache, followed by nausea and vomiting. The patient has no fever or meningismus, no history of trauma. This headache is typical of previous migraines although she has not had one since she was on chemotherapy several years ago. I have talked to her about the possibility of venous thrombosis, subarachnoid hemorrhage, CVA; however, she would like to proceed with symptomatic treatment and does not want to have any imaging at this time. There is no clinical evidence that would be suggestive of meningitis on LP is not indicated either. No plan to initiate basic left blood work as well as symptomatically treatment and reevaluated the patient.  ----------------------------------------- 11:35 PM on 03/16/2015 -----------------------------------------  The patient is feeling significantly better. Her pain is at a 5 out of 10 from a 9 out of 10, and her nausea has significantly improved. She is tolerating ice chips at this time. We will plan to finish her fluid, and re-dose her anti-medic. At that time if she is able to tolerate  by mouth, she will be discharged home. ____________________________________________  FINAL CLINICAL IMPRESSION(S) / ED  DIAGNOSES  Final diagnoses:  Other migraine without status migrainosus, not intractable  Non-intractable vomiting with nausea, vomiting of unspecified type      NEW MEDICATIONS STARTED DURING THIS VISIT:  New Prescriptions   No medications on file     Eula Listen, MD 03/16/15 2337

## 2015-03-16 NOTE — ED Notes (Signed)
Pt reports headache since last pm - c/o nausea and vomiting since this am - reports unable to keep anything down - pt reports sore throat from vomiting

## 2015-03-17 MED ORDER — HEPARIN SOD (PORK) LOCK FLUSH 100 UNIT/ML IV SOLN
INTRAVENOUS | Status: AC
  Administered 2015-03-17: 500 [IU] via INTRAVENOUS
  Filled 2015-03-17: qty 5

## 2015-03-17 MED ORDER — HEPARIN SOD (PORK) LOCK FLUSH 100 UNIT/ML IV SOLN
500.0000 [IU] | Freq: Once | INTRAVENOUS | Status: AC
  Administered 2015-03-17: 500 [IU] via INTRAVENOUS

## 2015-03-17 NOTE — ED Notes (Signed)
Pt was discharged to the lobby and advised not to drive until 5am because of Dilaudid dosage given at 1115pm

## 2015-03-17 NOTE — ED Notes (Signed)
Gave sprite and graham crackers to see if pt could tolerate po fluids and food - MD stated to give zofran and if pt tolerated fluids and food po should could be discharged

## 2015-03-28 ENCOUNTER — Other Ambulatory Visit: Payer: Self-pay

## 2015-03-28 NOTE — Telephone Encounter (Signed)
I spoke with patient, she said she was not very happy with Dr.Khan and was researching for a different cardiologist. I advised her to call him and ask that he give her a refill until she finds another doctor. She agreed.

## 2015-03-28 NOTE — Telephone Encounter (Signed)
Please ask patient to call her cardiologist for this medicine; I do not know how long she is supposed to take it, so we want the doctor monitoring that condition to prescribe; thank you; she should NOT miss any doses, so make sure she contacts them and gets this asap

## 2015-03-28 NOTE — Telephone Encounter (Signed)
Routing to provider  

## 2015-03-29 ENCOUNTER — Inpatient Hospital Stay: Payer: Medicaid Other

## 2015-03-29 ENCOUNTER — Inpatient Hospital Stay: Payer: Medicaid Other | Attending: Oncology | Admitting: Oncology

## 2015-03-29 VITALS — BP 163/82 | HR 82 | Temp 97.6°F | Resp 16 | Wt 227.5 lb

## 2015-03-29 DIAGNOSIS — Z5112 Encounter for antineoplastic immunotherapy: Secondary | ICD-10-CM | POA: Diagnosis not present

## 2015-03-29 DIAGNOSIS — I10 Essential (primary) hypertension: Secondary | ICD-10-CM | POA: Diagnosis not present

## 2015-03-29 DIAGNOSIS — D649 Anemia, unspecified: Secondary | ICD-10-CM | POA: Diagnosis not present

## 2015-03-29 DIAGNOSIS — Z803 Family history of malignant neoplasm of breast: Secondary | ICD-10-CM

## 2015-03-29 DIAGNOSIS — K219 Gastro-esophageal reflux disease without esophagitis: Secondary | ICD-10-CM | POA: Diagnosis not present

## 2015-03-29 DIAGNOSIS — Z808 Family history of malignant neoplasm of other organs or systems: Secondary | ICD-10-CM | POA: Insufficient documentation

## 2015-03-29 DIAGNOSIS — Z8669 Personal history of other diseases of the nervous system and sense organs: Secondary | ICD-10-CM | POA: Insufficient documentation

## 2015-03-29 DIAGNOSIS — E669 Obesity, unspecified: Secondary | ICD-10-CM | POA: Insufficient documentation

## 2015-03-29 DIAGNOSIS — R0602 Shortness of breath: Secondary | ICD-10-CM

## 2015-03-29 DIAGNOSIS — G4730 Sleep apnea, unspecified: Secondary | ICD-10-CM | POA: Diagnosis not present

## 2015-03-29 DIAGNOSIS — C50912 Malignant neoplasm of unspecified site of left female breast: Secondary | ICD-10-CM | POA: Diagnosis not present

## 2015-03-29 DIAGNOSIS — F329 Major depressive disorder, single episode, unspecified: Secondary | ICD-10-CM | POA: Diagnosis not present

## 2015-03-29 DIAGNOSIS — E039 Hypothyroidism, unspecified: Secondary | ICD-10-CM | POA: Diagnosis not present

## 2015-03-29 DIAGNOSIS — N3281 Overactive bladder: Secondary | ICD-10-CM | POA: Insufficient documentation

## 2015-03-29 DIAGNOSIS — Z170 Estrogen receptor positive status [ER+]: Secondary | ICD-10-CM | POA: Diagnosis not present

## 2015-03-29 DIAGNOSIS — I2582 Chronic total occlusion of coronary artery: Secondary | ICD-10-CM | POA: Diagnosis not present

## 2015-03-29 LAB — CBC WITH DIFFERENTIAL/PLATELET
BASOS ABS: 0.1 10*3/uL (ref 0–0.1)
BASOS PCT: 1 %
EOS ABS: 0.3 10*3/uL (ref 0–0.7)
EOS PCT: 5 %
HCT: 32.5 % — ABNORMAL LOW (ref 35.0–47.0)
Hemoglobin: 11 g/dL — ABNORMAL LOW (ref 12.0–16.0)
Lymphocytes Relative: 23 %
Lymphs Abs: 1.3 10*3/uL (ref 1.0–3.6)
MCH: 29.4 pg (ref 26.0–34.0)
MCHC: 34 g/dL (ref 32.0–36.0)
MCV: 86.4 fL (ref 80.0–100.0)
Monocytes Absolute: 0.6 10*3/uL (ref 0.2–0.9)
Monocytes Relative: 11 %
NEUTROS PCT: 60 %
Neutro Abs: 3.5 10*3/uL (ref 1.4–6.5)
PLATELETS: 203 10*3/uL (ref 150–440)
RBC: 3.76 MIL/uL — AB (ref 3.80–5.20)
RDW: 14.1 % (ref 11.5–14.5)
WBC: 5.8 10*3/uL (ref 3.6–11.0)

## 2015-03-29 LAB — BASIC METABOLIC PANEL
Anion gap: 7 (ref 5–15)
BUN: 14 mg/dL (ref 6–20)
CO2: 26 mmol/L (ref 22–32)
CREATININE: 1.12 mg/dL — AB (ref 0.44–1.00)
Calcium: 8.8 mg/dL — ABNORMAL LOW (ref 8.9–10.3)
Chloride: 103 mmol/L (ref 101–111)
GFR, EST NON AFRICAN AMERICAN: 55 mL/min — AB (ref >60)
Glucose, Bld: 117 mg/dL — ABNORMAL HIGH (ref 65–99)
Potassium: 3.6 mmol/L (ref 3.5–5.1)
SODIUM: 136 mmol/L (ref 135–145)

## 2015-03-29 MED ORDER — ACETAMINOPHEN 325 MG PO TABS
650.0000 mg | ORAL_TABLET | Freq: Once | ORAL | Status: AC
  Administered 2015-03-29: 650 mg via ORAL
  Filled 2015-03-29: qty 2

## 2015-03-29 MED ORDER — DIPHENHYDRAMINE HCL 25 MG PO CAPS
50.0000 mg | ORAL_CAPSULE | Freq: Once | ORAL | Status: AC
  Administered 2015-03-29: 50 mg via ORAL
  Filled 2015-03-29: qty 2

## 2015-03-29 MED ORDER — SODIUM CHLORIDE 0.9% FLUSH
10.0000 mL | INTRAVENOUS | Status: DC | PRN
  Administered 2015-03-29: 10 mL via INTRAVENOUS
  Filled 2015-03-29: qty 10

## 2015-03-29 MED ORDER — TRASTUZUMAB CHEMO INJECTION 440 MG
6.0000 mg/kg | Freq: Once | INTRAVENOUS | Status: AC
  Administered 2015-03-29: 651 mg via INTRAVENOUS
  Filled 2015-03-29: qty 31

## 2015-03-29 MED ORDER — HEPARIN SOD (PORK) LOCK FLUSH 100 UNIT/ML IV SOLN
500.0000 [IU] | Freq: Once | INTRAVENOUS | Status: AC
  Administered 2015-03-29: 500 [IU] via INTRAVENOUS
  Filled 2015-03-29: qty 5

## 2015-03-29 MED ORDER — SODIUM CHLORIDE 0.9 % IV SOLN
Freq: Once | INTRAVENOUS | Status: AC
Start: 2015-03-29 — End: 2015-03-29
  Administered 2015-03-29: 15:00:00 via INTRAVENOUS
  Filled 2015-03-29: qty 1000

## 2015-03-29 NOTE — Progress Notes (Signed)
Patent was treated in ER for Migraine with nausea/vomiting on 03/16/2015.   She does have headaches now but no as bad.

## 2015-03-30 DIAGNOSIS — R51 Headache: Secondary | ICD-10-CM

## 2015-03-30 DIAGNOSIS — R519 Headache, unspecified: Secondary | ICD-10-CM | POA: Insufficient documentation

## 2015-04-04 ENCOUNTER — Ambulatory Visit: Payer: Medicaid Other

## 2015-04-04 ENCOUNTER — Other Ambulatory Visit: Payer: Medicaid Other

## 2015-04-04 ENCOUNTER — Ambulatory Visit: Payer: Medicaid Other | Admitting: Oncology

## 2015-04-16 NOTE — Progress Notes (Signed)
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  Chief Complaint  Patient presents with  . Breast Cancer    INTERVAL HISTORY:  Patient returns to clinic today for further evaluation and consideration of cycle 9 of Herceptin only. She currently feels well and is asymptomatic. She does not have any further diarrhea. She has no neurologic complaints. She denies any recent fevers or illnesses. Her appetite is improved and her weight is stable. She has no chest pain or shortness of breath. She has no urinary complaints. Patient offers no specific complaints today.  REVIEW OF SYSTEMS:   Review of Systems  Constitutional: Negative.  Negative for fever and malaise/fatigue.  HENT: Negative.   Respiratory: Negative.   Cardiovascular: Negative.   Gastrointestinal: Negative for nausea, abdominal pain and diarrhea.  Musculoskeletal: Negative.   Neurological: Negative for weakness.  Psychiatric/Behavioral: Negative.     As per HPI. Otherwise, a complete review of systems is negatve.  PAST MEDICAL HISTORY: Past Medical History  Diagnosis Date  . Major depressive disorder, recurrent ([place])   . HTN (hypertension)   . Overactive bladder   . Migraines   . Gastritis   . Hypothyroidism   . OSA (obstructive sleep apnea)     untreated due to lack of insurance  . Insomnia   . GERD (gastroesophageal reflux disease)   . Pericarditis 1997  . Detached retina 1997  . Chronic headache   . Morbid obesity ([place])   . Breast cancer ([place])   . Bronchitis 04-2014  . Shortness of breath dyspnea     with exertion only  . Anemia     h/o  .  Pericarditis 1997  . Family history of adverse reaction to anesthesia     pts dad has pseudocholinestrace deficieny  . NSTEMI (non-ST elevated myocardial infarction) ([place]) 04/16/14    Due to demand- normal Cath 04/19/14  . NSTEMI (non-ST elevated myocardial infarction) ([place]) 01-24-15    PAST SURGICAL HISTORY: Past Surgical History  Procedure Laterality Date  . Back surgery      L4-5  . Knee surgery    . Cesarean section    . Coronary angioplasty  April 2016  . Retinal detachment surgery Right 1997  . Foot surgery    . Breast lumpectomy with sentinel lymph node biopsy Left 08/13/2014    Procedure: BREAST LUMPECTOMY WITH SENTINEL LYMPH NODE BIOPSY;  Surgeon: Robert Bellow, MD;  Location: [facility] ORS;  Service: General;  Laterality: Left;  . Breast mammosite Left 08/30/2014    Procedure: MAMMOSITE BREAST;  Surgeon: Robert Bellow, MD;  Location: [facility] ORS;  Service: General;  Laterality: Left;  . Portacath placement Right 08/30/2014    Procedure: INSERTION PORT-A-CATH;  Surgeon: Robert Bellow, MD;  Location: [facility] ORS;  Service: General;  Laterality: Right;    FAMILY HISTORY Family History  Problem Relation Age of Onset  . Alcohol abuse Mother   . Thyroid disease Mother   . Hypertension Mother   . COPD Mother   . Cancer Father     liposarcoma  . Congestive Heart Failure Father     V Tach  . Depression Father   .  Hypertension Father   . Cancer Maternal Uncle     Pancreatic cancer  . Heart disease Paternal Grandfather 80    MI  . Diabetes Other   . Cancer Sister     breast  . Breast cancer Sister 22       ADVANCED DIRECTIVES:    HEALTH MAINTENANCE: Social History  Substance Use Topics  . Smoking status: Never Smoker   . Smokeless tobacco: Never Used  . Alcohol Use: No     Colonoscopy:  PAP:  Bone density:  Lipid panel:  Allergies  Allergen Reactions  . Reglan [Metoclopramide] Shortness Of Breath and Other (See Comments)    This medication caused a dystonic  reaction.    Marland Kitchen Paxil [Paroxetine Hcl] Other (See Comments)    Reaction:  Blurred vision  . Tegaderm Ag Mesh [Silver] Rash  . Ultram [Tramadol] Itching and Rash    Current Outpatient Prescriptions  Medication Sig Dispense Refill  . acetaminophen (TYLENOL) 500 MG tablet Take 1,000 mg by mouth every 6 (six) hours as needed for mild pain or headache.    Marland Kitchen aspirin EC 81 MG tablet Take 81 mg by mouth daily.    . cholecalciferol (VITAMIN D) 1000 UNITS tablet Take 1,000 Units by mouth daily.    . diphenhydrAMINE (BENADRYL) 25 MG tablet Take 25 mg by mouth every 6 (six) hours as needed for allergies.     Marland Kitchen escitalopram (LEXAPRO) 20 MG tablet Take 20 mg by mouth daily.    Marland Kitchen HYDROcodone-acetaminophen (NORCO) 5-325 MG tablet Take 1 tablet by mouth every 6 (six) hours as needed for moderate pain. 15 tablet 0  . levothyroxine (SYNTHROID, LEVOTHROID) 100 MCG tablet Take 1 tablet (100 mcg total) by mouth daily. 30 tablet 11  . lidocaine-prilocaine (EMLA) cream Apply 1 application topically as needed (prior to accessing port).    . Multiple Vitamin (MULTIVITAMIN WITH MINERALS) TABS tablet Take 1 tablet by mouth daily.    . ranitidine (ZANTAC) 150 MG tablet Take 300 mg by mouth at bedtime.    . simvastatin (ZOCOR) 10 MG tablet Take 10 mg by mouth daily.    . vitamin B-12 (CYANOCOBALAMIN) 1000 MCG tablet Take 1,000 mcg by mouth daily.     No current facility-administered medications for this visit.   Facility-Administered Medications Ordered in Other Visits  Medication Dose Route Frequency Provider Last Rate Last Dose  . sodium chloride 0.9 % injection 10 mL  10 mL Intracatheter PRN Lloyd Huger, MD   10 mL at 11/30/14 1205    OBJECTIVE: Filed Vitals:   03/29/15 1408  BP: 163/82  Pulse: 82  Temp: 97.6 F (36.4 C)  Resp: 16     Body mass index is 37.86 kg/(m^2).    ECOG FS:0 - Asymptomatic  General: Well-developed, well-nourished, no acute distress. Eyes: Pink conjunctiva, anicteric  sclera. Breasts: Exam deferred today. Lungs: Clear to auscultation bilaterally. Heart: Regular rate and rhythm. No rubs, murmurs, or gallops. Abdomen: Soft, nontender, nondistended. No organomegaly noted, normoactive bowel sounds. Musculoskeletal: No edema, cyanosis, or clubbing. Neuro: Alert, answering all questions appropriately. Cranial nerves grossly intact. Skin: No rashes or petechiae noted. Psych: Normal affect.   LAB RESULTS:  Lab Results  Component Value Date   NA 136 03/29/2015   K 3.6 03/29/2015   CL 103 03/29/2015   CO2 26 03/29/2015   GLUCOSE 117* 03/29/2015   BUN 14 03/29/2015   CREATININE 1.12* 03/29/2015   CALCIUM 8.8* 03/29/2015   PROT 6.5 03/08/2015  ALBUMIN 3.8 03/08/2015   AST 21 03/08/2015   ALT 18 03/08/2015   ALKPHOS 78 03/08/2015   BILITOT 1.1 03/08/2015   GFRNONAA 55* 03/29/2015   GFRAA >60 03/29/2015    Lab Results  Component Value Date   WBC 5.8 03/29/2015   NEUTROABS 3.5 03/29/2015   HGB 11.0* 03/29/2015   HCT 32.5* 03/29/2015   MCV 86.4 03/29/2015   PLT 203 03/29/2015     STUDIES: No results found.  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  PLAN:    1. Breast cancer: Given the fact that patient is HER-2 positive, she will benefit from adjuvant chemotherapy. Patient completed MammoSite radiation. Patient's MUGA scan from February 11, 2015 was 53% which is unchanged from her previous EF of 52%, repeat in April 2017.  Given patient's persistent side effects, chemotherapy was discontinued altogether after 3 cycles. Proceed with cycle 9 of 18 of Herceptin only today. Return to clinic in 3 weeks for consideration of cycle 10. She does not require an aromatase inhibitor given the fact that her tumor is ER/PR negative.   2. Anemia: Mild, monitor. 3. Hypertension: Blood pressure mildly elevated today, continue current medications. Treatment per PCP.  Patient expressed understanding and was in  agreement with this plan. She also understands that She can call clinic at any time with any questions, concerns, or complaints.   Breast cancer, female   Staging form: Breast, AJCC 7th Edition     Pathologic stage from 08/17/2014: Stage IA (T1c, N0, cM0) - Signed by Lloyd Huger, MD on 08/17/2014   Lloyd Huger, MD   04/16/2015 8:45 AM

## 2015-04-18 ENCOUNTER — Other Ambulatory Visit: Payer: Self-pay | Admitting: *Deleted

## 2015-04-18 DIAGNOSIS — C50912 Malignant neoplasm of unspecified site of left female breast: Secondary | ICD-10-CM

## 2015-04-19 ENCOUNTER — Inpatient Hospital Stay: Payer: Medicaid Other | Attending: Oncology

## 2015-04-19 ENCOUNTER — Inpatient Hospital Stay (HOSPITAL_BASED_OUTPATIENT_CLINIC_OR_DEPARTMENT_OTHER): Payer: Medicaid Other | Admitting: Oncology

## 2015-04-19 ENCOUNTER — Inpatient Hospital Stay: Payer: Medicaid Other

## 2015-04-19 VITALS — BP 120/75 | HR 67 | Temp 98.4°F | Resp 18 | Wt 231.3 lb

## 2015-04-19 DIAGNOSIS — N3281 Overactive bladder: Secondary | ICD-10-CM | POA: Diagnosis not present

## 2015-04-19 DIAGNOSIS — D649 Anemia, unspecified: Secondary | ICD-10-CM | POA: Diagnosis not present

## 2015-04-19 DIAGNOSIS — E669 Obesity, unspecified: Secondary | ICD-10-CM | POA: Insufficient documentation

## 2015-04-19 DIAGNOSIS — K219 Gastro-esophageal reflux disease without esophagitis: Secondary | ICD-10-CM | POA: Diagnosis not present

## 2015-04-19 DIAGNOSIS — I319 Disease of pericardium, unspecified: Secondary | ICD-10-CM | POA: Diagnosis not present

## 2015-04-19 DIAGNOSIS — Z7982 Long term (current) use of aspirin: Secondary | ICD-10-CM

## 2015-04-19 DIAGNOSIS — G4730 Sleep apnea, unspecified: Secondary | ICD-10-CM

## 2015-04-19 DIAGNOSIS — R42 Dizziness and giddiness: Secondary | ICD-10-CM | POA: Diagnosis not present

## 2015-04-19 DIAGNOSIS — E039 Hypothyroidism, unspecified: Secondary | ICD-10-CM

## 2015-04-19 DIAGNOSIS — R0602 Shortness of breath: Secondary | ICD-10-CM | POA: Diagnosis not present

## 2015-04-19 DIAGNOSIS — Z170 Estrogen receptor positive status [ER+]: Secondary | ICD-10-CM | POA: Diagnosis not present

## 2015-04-19 DIAGNOSIS — Z79899 Other long term (current) drug therapy: Secondary | ICD-10-CM

## 2015-04-19 DIAGNOSIS — I10 Essential (primary) hypertension: Secondary | ICD-10-CM

## 2015-04-19 DIAGNOSIS — C50912 Malignant neoplasm of unspecified site of left female breast: Secondary | ICD-10-CM

## 2015-04-19 DIAGNOSIS — I252 Old myocardial infarction: Secondary | ICD-10-CM

## 2015-04-19 DIAGNOSIS — R51 Headache: Secondary | ICD-10-CM | POA: Insufficient documentation

## 2015-04-19 DIAGNOSIS — F329 Major depressive disorder, single episode, unspecified: Secondary | ICD-10-CM | POA: Insufficient documentation

## 2015-04-19 DIAGNOSIS — Z5112 Encounter for antineoplastic immunotherapy: Secondary | ICD-10-CM | POA: Diagnosis present

## 2015-04-19 DIAGNOSIS — Z171 Estrogen receptor negative status [ER-]: Secondary | ICD-10-CM | POA: Insufficient documentation

## 2015-04-19 DIAGNOSIS — M549 Dorsalgia, unspecified: Secondary | ICD-10-CM | POA: Diagnosis not present

## 2015-04-19 LAB — CBC WITH DIFFERENTIAL/PLATELET
Basophils Absolute: 0.1 10*3/uL (ref 0–0.1)
Basophils Relative: 1 %
EOS PCT: 4 %
Eosinophils Absolute: 0.3 10*3/uL (ref 0–0.7)
HCT: 31.6 % — ABNORMAL LOW (ref 35.0–47.0)
Hemoglobin: 10.9 g/dL — ABNORMAL LOW (ref 12.0–16.0)
LYMPHS ABS: 2.1 10*3/uL (ref 1.0–3.6)
LYMPHS PCT: 25 %
MCH: 29.2 pg (ref 26.0–34.0)
MCHC: 34.3 g/dL (ref 32.0–36.0)
MCV: 85.2 fL (ref 80.0–100.0)
Monocytes Absolute: 0.8 10*3/uL (ref 0.2–0.9)
Monocytes Relative: 9 %
Neutro Abs: 5.1 10*3/uL (ref 1.4–6.5)
Neutrophils Relative %: 61 %
Platelets: 251 10*3/uL (ref 150–440)
RBC: 3.71 MIL/uL — AB (ref 3.80–5.20)
RDW: 15.2 % — ABNORMAL HIGH (ref 11.5–14.5)
WBC: 8.4 10*3/uL (ref 3.6–11.0)

## 2015-04-19 LAB — BASIC METABOLIC PANEL
Anion gap: 6 (ref 5–15)
BUN: 20 mg/dL (ref 6–20)
CHLORIDE: 104 mmol/L (ref 101–111)
CO2: 26 mmol/L (ref 22–32)
Calcium: 8.7 mg/dL — ABNORMAL LOW (ref 8.9–10.3)
Creatinine, Ser: 1 mg/dL (ref 0.44–1.00)
GFR calc Af Amer: >60 mL/min (ref >60)
GFR calc non Af Amer: >60 mL/min (ref >60)
GLUCOSE: 94 mg/dL (ref 65–99)
POTASSIUM: 3.8 mmol/L (ref 3.5–5.1)
SODIUM: 136 mmol/L (ref 135–145)

## 2015-04-19 MED ORDER — ACETAMINOPHEN 325 MG PO TABS
650.0000 mg | ORAL_TABLET | Freq: Once | ORAL | Status: AC
  Administered 2015-04-19: 650 mg via ORAL
  Filled 2015-04-19: qty 2

## 2015-04-19 MED ORDER — DIPHENHYDRAMINE HCL 25 MG PO CAPS
50.0000 mg | ORAL_CAPSULE | Freq: Once | ORAL | Status: AC
  Administered 2015-04-19: 50 mg via ORAL
  Filled 2015-04-19: qty 2

## 2015-04-19 MED ORDER — SODIUM CHLORIDE 0.9 % IJ SOLN
10.0000 mL | INTRAMUSCULAR | Status: DC | PRN
  Filled 2015-04-19: qty 10

## 2015-04-19 MED ORDER — HEPARIN SOD (PORK) LOCK FLUSH 100 UNIT/ML IV SOLN
500.0000 [IU] | Freq: Once | INTRAVENOUS | Status: AC | PRN
  Administered 2015-04-19: 500 [IU]

## 2015-04-19 MED ORDER — SODIUM CHLORIDE 0.9 % IV SOLN
Freq: Once | INTRAVENOUS | Status: AC
  Administered 2015-04-19: 15:00:00 via INTRAVENOUS
  Filled 2015-04-19: qty 1000

## 2015-04-19 MED ORDER — TRASTUZUMAB CHEMO INJECTION 440 MG
6.0000 mg/kg | Freq: Once | INTRAVENOUS | Status: AC
  Administered 2015-04-19: 651 mg via INTRAVENOUS
  Filled 2015-04-19: qty 31

## 2015-04-19 NOTE — Progress Notes (Signed)
Patient went to [facility] program last week but her blood pressure elevated at 180/114 and was sent to cardiology.  Dr. Chancy Milroy started her on bp meds which has improved her blood pressure but still has headaches with occasional blurred vision.  She has history of cluster headaches but the headache is consistent with some days worse than others.  Also is having new low back pain that is worse when lying down.

## 2015-04-24 NOTE — Progress Notes (Signed)
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  Chief Complaint  Patient presents with  . Breast Cancer    INTERVAL HISTORY:  Patient returns to clinic today for further evaluation and consideration of cycle 10 of Herceptin only. She continues to have persistent headaches and occasional blurry vision likely secondary to significantly elevated blood pressure, but her cardiologist is adjusting her medications. She otherwise feels well and is asymptomatic. She does not have any further diarrhea. She has no other neurologic complaints. She denies any recent fevers or illnesses. Her appetite is improved and her weight is stable. She has no chest pain or shortness of breath. She has no urinary complaints. Patient offers no specific complaints today.  REVIEW OF SYSTEMS:   Review of Systems  Constitutional: Negative.  Negative for fever and malaise/fatigue.  Eyes: Positive for blurred vision.  Respiratory: Negative.   Cardiovascular: Negative.   Gastrointestinal: Negative for nausea, abdominal pain and diarrhea.  Genitourinary: Negative.   Musculoskeletal: Negative.   Neurological: Negative.  Negative for weakness.  Psychiatric/Behavioral: Negative.     As per HPI. Otherwise, a complete review of systems is negatve.  PAST MEDICAL HISTORY: Past Medical History  Diagnosis Date  . Major depressive disorder, recurrent ([place])   . HTN (hypertension)   . Overactive bladder   . Migraines   . Gastritis   . Hypothyroidism   . OSA (obstructive sleep apnea)     untreated due to lack of insurance  . Insomnia   . GERD  (gastroesophageal reflux disease)   . Pericarditis 1997  . Detached retina 1997  . Chronic headache   . Morbid obesity ([place])   . Breast cancer ([place])   . Bronchitis 04-2014  . Shortness of breath dyspnea     with exertion only  . Anemia     h/o  . Pericarditis 1997  . Family history of adverse reaction to anesthesia     pts dad has pseudocholinestrace deficieny  . NSTEMI (non-ST elevated myocardial infarction) ([place]) 04/16/14    Due to demand- normal Cath 04/19/14  . NSTEMI (non-ST elevated myocardial infarction) ([place]) 01-24-15    PAST SURGICAL HISTORY: Past Surgical History  Procedure Laterality Date  . Back surgery      L4-5  . Knee surgery    . Cesarean section    . Coronary angioplasty  April 2016  . Retinal detachment surgery Right 1997  . Foot surgery    . Breast lumpectomy with sentinel lymph node biopsy Left 08/13/2014    Procedure: BREAST LUMPECTOMY WITH SENTINEL LYMPH NODE BIOPSY;  Surgeon: Robert Bellow, MD;  Location: [facility] ORS;  Service: General;  Laterality: Left;  . Breast mammosite Left 08/30/2014    Procedure: MAMMOSITE BREAST;  Surgeon: Robert Bellow, MD;  Location: [facility] ORS;  Service: General;  Laterality: Left;  . Portacath placement Right 08/30/2014    Procedure: INSERTION PORT-A-CATH;  Surgeon: Robert Bellow, MD;  Location: [facility] ORS;  Service: General;  Laterality: Right;    FAMILY HISTORY Family History  Problem Relation Age of Onset  . Alcohol abuse Mother   . Thyroid disease Mother   . Hypertension Mother   .  COPD Mother   . Cancer Father     liposarcoma  . Congestive Heart Failure Father     V Tach  . Depression Father   . Hypertension Father   . Cancer Maternal Uncle     Pancreatic cancer  . Heart disease Paternal Grandfather 81    MI  . Diabetes Other   . Cancer Sister     breast  . Breast cancer Sister 53       ADVANCED DIRECTIVES:    HEALTH MAINTENANCE: Social History  Substance Use Topics  . Smoking status: Never Smoker    . Smokeless tobacco: Never Used  . Alcohol Use: No     Colonoscopy:  PAP:  Bone density:  Lipid panel:  Allergies  Allergen Reactions  . Reglan [Metoclopramide] Shortness Of Breath and Other (See Comments)    This medication caused a dystonic reaction.    Marland Kitchen Paxil [Paroxetine Hcl] Other (See Comments)    Reaction:  Blurred vision  . Tegaderm Ag Mesh [Silver] Rash  . Ultram [Tramadol] Itching and Rash    Current Outpatient Prescriptions  Medication Sig Dispense Refill  . acetaminophen (TYLENOL) 500 MG tablet Take 1,000 mg by mouth every 6 (six) hours as needed for mild pain or headache.    Marland Kitchen aspirin EC 81 MG tablet Take 81 mg by mouth daily.    . cholecalciferol (VITAMIN D) 1000 UNITS tablet Take 1,000 Units by mouth daily.    . diphenhydrAMINE (BENADRYL) 25 MG tablet Take 25 mg by mouth every 6 (six) hours as needed for allergies.     Marland Kitchen escitalopram (LEXAPRO) 20 MG tablet Take 20 mg by mouth daily.    . hydrALAZINE (APRESOLINE) 25 MG tablet Take 25 mg by mouth 2 (two) times daily.    Marland Kitchen HYDROcodone-acetaminophen (NORCO) 5-325 MG tablet Take 1 tablet by mouth every 6 (six) hours as needed for moderate pain. 15 tablet 0  . levothyroxine (SYNTHROID, LEVOTHROID) 100 MCG tablet Take 1 tablet (100 mcg total) by mouth daily. 30 tablet 11  . lidocaine-prilocaine (EMLA) cream Apply 1 application topically as needed (prior to accessing port).    . metoprolol succinate (TOPROL-XL) 100 MG 24 hr tablet Take 100 mg by mouth daily. Take with or immediately following a meal.    . Multiple Vitamin (MULTIVITAMIN WITH MINERALS) TABS tablet Take 1 tablet by mouth daily.    . ranitidine (ZANTAC) 150 MG tablet Take 300 mg by mouth at bedtime.    . simvastatin (ZOCOR) 10 MG tablet Take 10 mg by mouth daily.    . vitamin B-12 (CYANOCOBALAMIN) 1000 MCG tablet Take 1,000 mcg by mouth daily.     No current facility-administered medications for this visit.   Facility-Administered Medications Ordered in  Other Visits  Medication Dose Route Frequency Provider Last Rate Last Dose  . sodium chloride 0.9 % injection 10 mL  10 mL Intracatheter PRN Lloyd Huger, MD   10 mL at 11/30/14 1205    OBJECTIVE: Filed Vitals:   04/19/15 1401  BP: 120/75  Pulse: 67  Temp: 98.4 F (36.9 C)  Resp: 18     Body mass index is 38.48 kg/(m^2).    ECOG FS:0 - Asymptomatic  General: Well-developed, well-nourished, no acute distress. Eyes: Pink conjunctiva, anicteric sclera. Breasts: Exam deferred today. Lungs: Clear to auscultation bilaterally. Heart: Regular rate and rhythm. No rubs, murmurs, or gallops. Abdomen: Soft, nontender, nondistended. No organomegaly noted, normoactive bowel sounds. Musculoskeletal: No edema, cyanosis, or clubbing. Neuro: Alert,  answering all questions appropriately. Cranial nerves grossly intact. Skin: No rashes or petechiae noted. Psych: Normal affect.   LAB RESULTS:  Lab Results  Component Value Date   NA 136 04/19/2015   K 3.8 04/19/2015   CL 104 04/19/2015   CO2 26 04/19/2015   GLUCOSE 94 04/19/2015   BUN 20 04/19/2015   CREATININE 1.00 04/19/2015   CALCIUM 8.7* 04/19/2015   PROT 6.5 03/08/2015   ALBUMIN 3.8 03/08/2015   AST 21 03/08/2015   ALT 18 03/08/2015   ALKPHOS 78 03/08/2015   BILITOT 1.1 03/08/2015   GFRNONAA >60 04/19/2015   GFRAA >60 04/19/2015    Lab Results  Component Value Date   WBC 8.4 04/19/2015   NEUTROABS 5.1 04/19/2015   HGB 10.9* 04/19/2015   HCT 31.6* 04/19/2015   MCV 85.2 04/19/2015   PLT 251 04/19/2015     STUDIES: No results found.  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  PLAN:    1. Breast cancer: Given the fact that patient is HER-2 positive, she will benefit from adjuvant chemotherapy. Patient completed MammoSite radiation. Patient's MUGA scan from February 11, 2015 was 53% which is unchanged from her previous EF of 52%, repeat in prior to next treatment. Given  patient's persistent side effects, chemotherapy was discontinued altogether after 3 cycles. Proceed with cycle 10 of 18 of Herceptin only today. Return to clinic in 3 weeks for consideration of cycle 11. She does not require an aromatase inhibitor given the fact that her tumor is ER/PR negative.   2. Anemia: Mild, monitor. 3. Hypertension: Blood pressure better controlled today. Continue treatment per cardiology. 4. Headaches: Likely secondary to persistently elevated blood pressure, but will get an MRI of her brain for further evaluation.   Patient expressed understanding and was in agreement with this plan. She also understands that She can call clinic at any time with any questions, concerns, or complaints.   Breast cancer, female   Staging form: Breast, AJCC 7th Edition     Pathologic stage from 08/17/2014: Stage IA (T1c, N0, cM0) - Signed by Lloyd Huger, MD on 08/17/2014   Lloyd Huger, MD   04/24/2015 6:39 PM

## 2015-04-27 ENCOUNTER — Other Ambulatory Visit: Payer: Self-pay | Admitting: *Deleted

## 2015-04-27 ENCOUNTER — Other Ambulatory Visit: Payer: Self-pay | Admitting: Family Medicine

## 2015-04-27 DIAGNOSIS — C50912 Malignant neoplasm of unspecified site of left female breast: Secondary | ICD-10-CM

## 2015-04-28 ENCOUNTER — Encounter: Payer: Self-pay | Admitting: *Deleted

## 2015-04-29 ENCOUNTER — Ambulatory Visit
Admission: RE | Admit: 2015-04-29 | Discharge: 2015-04-29 | Disposition: A | Discharge status: Home / Self Care | Payer: Medicaid Other | Source: Ambulatory Visit | Attending: Oncology | Admitting: Oncology

## 2015-04-29 DIAGNOSIS — C50912 Malignant neoplasm of unspecified site of left female breast: Secondary | ICD-10-CM

## 2015-04-29 MED ORDER — GADOBENATE DIMEGLUMINE 529 MG/ML IV SOLN
20.0000 mL | Freq: Once | INTRAVENOUS | Status: AC | PRN
  Administered 2015-04-29: 20 mL via INTRAVENOUS

## 2015-04-29 MED ORDER — TECHNETIUM TC 99M-LABELED RED BLOOD CELLS IV KIT
20.0000 | PACK | Freq: Once | INTRAVENOUS | Status: AC | PRN
  Administered 2015-04-29: 20.97 via INTRAVENOUS

## 2015-05-04 ENCOUNTER — Ambulatory Visit: Payer: Medicaid Other | Admitting: Radiation Oncology

## 2015-05-04 ENCOUNTER — Inpatient Hospital Stay: Admission: RE | Admit: 2015-05-04 | Payer: Medicaid Other | Source: Ambulatory Visit | Admitting: Radiation Oncology

## 2015-05-05 ENCOUNTER — Ambulatory Visit: Payer: Medicaid Other

## 2015-05-09 ENCOUNTER — Other Ambulatory Visit: Payer: Self-pay | Admitting: *Deleted

## 2015-05-09 DIAGNOSIS — C50912 Malignant neoplasm of unspecified site of left female breast: Secondary | ICD-10-CM

## 2015-05-10 ENCOUNTER — Inpatient Hospital Stay: Payer: Medicaid Other

## 2015-05-10 ENCOUNTER — Encounter: Payer: Self-pay | Admitting: Radiation Oncology

## 2015-05-10 ENCOUNTER — Inpatient Hospital Stay (HOSPITAL_BASED_OUTPATIENT_CLINIC_OR_DEPARTMENT_OTHER): Payer: Medicaid Other | Admitting: Oncology

## 2015-05-10 ENCOUNTER — Ambulatory Visit
Admission: RE | Admit: 2015-05-10 | Discharge: 2015-05-10 | Disposition: A | Discharge status: Home / Self Care | Payer: Medicaid Other | Source: Ambulatory Visit | Attending: Radiation Oncology | Admitting: Radiation Oncology

## 2015-05-10 VITALS — BP 135/72 | HR 60 | Temp 97.0°F | Resp 18

## 2015-05-10 VITALS — BP 130/81 | HR 68 | Temp 97.6°F | Wt 229.5 lb

## 2015-05-10 VITALS — BP 121/82 | HR 61 | Temp 97.6°F | Resp 16 | Wt 229.5 lb

## 2015-05-10 DIAGNOSIS — Z7982 Long term (current) use of aspirin: Secondary | ICD-10-CM

## 2015-05-10 DIAGNOSIS — Z79899 Other long term (current) drug therapy: Secondary | ICD-10-CM

## 2015-05-10 DIAGNOSIS — C50912 Malignant neoplasm of unspecified site of left female breast: Secondary | ICD-10-CM

## 2015-05-10 DIAGNOSIS — I252 Old myocardial infarction: Secondary | ICD-10-CM

## 2015-05-10 DIAGNOSIS — K219 Gastro-esophageal reflux disease without esophagitis: Secondary | ICD-10-CM

## 2015-05-10 DIAGNOSIS — R42 Dizziness and giddiness: Secondary | ICD-10-CM

## 2015-05-10 DIAGNOSIS — I10 Essential (primary) hypertension: Secondary | ICD-10-CM

## 2015-05-10 DIAGNOSIS — M549 Dorsalgia, unspecified: Secondary | ICD-10-CM

## 2015-05-10 DIAGNOSIS — G4730 Sleep apnea, unspecified: Secondary | ICD-10-CM

## 2015-05-10 DIAGNOSIS — R51 Headache: Secondary | ICD-10-CM | POA: Diagnosis not present

## 2015-05-10 DIAGNOSIS — F329 Major depressive disorder, single episode, unspecified: Secondary | ICD-10-CM

## 2015-05-10 DIAGNOSIS — R0602 Shortness of breath: Secondary | ICD-10-CM

## 2015-05-10 DIAGNOSIS — Z171 Estrogen receptor negative status [ER-]: Secondary | ICD-10-CM | POA: Diagnosis not present

## 2015-05-10 DIAGNOSIS — D649 Anemia, unspecified: Secondary | ICD-10-CM | POA: Diagnosis not present

## 2015-05-10 DIAGNOSIS — N3281 Overactive bladder: Secondary | ICD-10-CM

## 2015-05-10 DIAGNOSIS — I319 Disease of pericardium, unspecified: Secondary | ICD-10-CM

## 2015-05-10 DIAGNOSIS — E039 Hypothyroidism, unspecified: Secondary | ICD-10-CM

## 2015-05-10 DIAGNOSIS — E669 Obesity, unspecified: Secondary | ICD-10-CM

## 2015-05-10 DIAGNOSIS — Z5112 Encounter for antineoplastic immunotherapy: Secondary | ICD-10-CM | POA: Diagnosis not present

## 2015-05-10 LAB — CBC WITH DIFFERENTIAL/PLATELET
Basophils Absolute: 0.1 10*3/uL (ref 0–0.1)
Basophils Relative: 1 %
Eosinophils Absolute: 0.3 10*3/uL (ref 0–0.7)
Eosinophils Relative: 5 %
HEMATOCRIT: 30.3 % — AB (ref 35.0–47.0)
HEMOGLOBIN: 10.3 g/dL — AB (ref 12.0–16.0)
LYMPHS ABS: 1.5 10*3/uL (ref 1.0–3.6)
LYMPHS PCT: 24 %
MCH: 29.2 pg (ref 26.0–34.0)
MCHC: 34 g/dL (ref 32.0–36.0)
MCV: 85.9 fL (ref 80.0–100.0)
MONOS PCT: 11 %
Monocytes Absolute: 0.7 10*3/uL (ref 0.2–0.9)
NEUTROS ABS: 3.7 10*3/uL (ref 1.4–6.5)
NEUTROS PCT: 59 %
Platelets: 200 10*3/uL (ref 150–440)
RBC: 3.53 MIL/uL — AB (ref 3.80–5.20)
RDW: 15.9 % — ABNORMAL HIGH (ref 11.5–14.5)
WBC: 6.3 10*3/uL (ref 3.6–11.0)

## 2015-05-10 LAB — BASIC METABOLIC PANEL
Anion gap: 9 (ref 5–15)
BUN: 18 mg/dL (ref 6–20)
CHLORIDE: 105 mmol/L (ref 101–111)
CO2: 25 mmol/L (ref 22–32)
CREATININE: 1.1 mg/dL — AB (ref 0.44–1.00)
Calcium: 9.1 mg/dL (ref 8.9–10.3)
GFR calc Af Amer: >60 mL/min (ref >60)
GFR calc non Af Amer: 56 mL/min — ABNORMAL LOW (ref >60)
GLUCOSE: 90 mg/dL (ref 65–99)
Potassium: 3.9 mmol/L (ref 3.5–5.1)
Sodium: 139 mmol/L (ref 135–145)

## 2015-05-10 MED ORDER — HEPARIN SOD (PORK) LOCK FLUSH 100 UNIT/ML IV SOLN
500.0000 [IU] | Freq: Once | INTRAVENOUS | Status: AC | PRN
  Administered 2015-05-10: 500 [IU]
  Filled 2015-05-10: qty 5

## 2015-05-10 MED ORDER — DIPHENHYDRAMINE HCL 25 MG PO CAPS
50.0000 mg | ORAL_CAPSULE | Freq: Once | ORAL | Status: AC
Start: 2015-05-10 — End: 2015-05-10
  Administered 2015-05-10: 50 mg via ORAL
  Filled 2015-05-10: qty 2

## 2015-05-10 MED ORDER — ACETAMINOPHEN 325 MG PO TABS
650.0000 mg | ORAL_TABLET | Freq: Once | ORAL | Status: AC
Start: 2015-05-10 — End: 2015-05-10
  Administered 2015-05-10: 650 mg via ORAL
  Filled 2015-05-10: qty 2

## 2015-05-10 MED ORDER — TRASTUZUMAB CHEMO INJECTION 440 MG
6.0000 mg/kg | Freq: Once | INTRAVENOUS | Status: AC
  Administered 2015-05-10: 651 mg via INTRAVENOUS
  Filled 2015-05-10: qty 31

## 2015-05-10 MED ORDER — SODIUM CHLORIDE 0.9 % IJ SOLN
10.0000 mL | INTRAMUSCULAR | Status: DC | PRN
Start: 2015-05-10 — End: 2015-05-10
  Filled 2015-05-10: qty 10

## 2015-05-10 MED ORDER — SODIUM CHLORIDE 0.9 % IV SOLN
Freq: Once | INTRAVENOUS | Status: AC
Start: 2015-05-10 — End: 2015-05-10
  Administered 2015-05-10: 16:00:00 via INTRAVENOUS
  Filled 2015-05-10: qty 1000

## 2015-05-10 NOTE — Progress Notes (Signed)
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  Chief Complaint  Patient presents with  . Breast Cancer    INTERVAL HISTORY:  Patient returns to clinic today for further evaluation and consideration of cycle 11 of Herceptin only. She continues to have persistent headaches and occasional blurry vision even after she has started new blood pressure medication and her blood pressure is now under control. She did see an eye doctor and she does have cataracts. She does have some right side back pain that started shortly after starting her new blood pressure medication. She otherwise feels well and is asymptomatic. She does not have any further diarrhea. She has no other neurologic complaints. She denies any recent fevers or illnesses. She has a good appetite and denies weight loss. She has no chest pain or shortness of breath. She has no urinary complaints. Patient offers no specific complaints today.  REVIEW OF SYSTEMS:   Review of Systems  Constitutional: Negative.  Negative for fever and malaise/fatigue.  Eyes: Positive for blurred vision.  Respiratory: Negative.   Cardiovascular: Negative.   Gastrointestinal: Negative for nausea, abdominal pain and diarrhea.  Genitourinary: Negative.   Musculoskeletal: Positive for back pain.  Neurological: Negative.  Negative for weakness.  Psychiatric/Behavioral: Negative.     As per HPI. Otherwise, a complete review of systems is negatve.  PAST MEDICAL HISTORY: Past Medical History  Diagnosis Date  . Major depressive disorder, recurrent ([place])   . HTN (hypertension)   . Overactive  bladder   . Migraines   . Gastritis   . Hypothyroidism   . OSA (obstructive sleep apnea)     untreated due to lack of insurance  . Insomnia   . GERD (gastroesophageal reflux disease)   . Pericarditis 1997  . Detached retina 1997  . Chronic headache   . Morbid obesity ([place])   . Breast cancer ([place])   . Bronchitis 04-2014  . Shortness of breath dyspnea     with exertion only  . Anemia     h/o  . Pericarditis 1997  . Family history of adverse reaction to anesthesia     pts dad has pseudocholinestrace deficieny  . NSTEMI (non-ST elevated myocardial infarction) ([place]) 04/16/14    Due to demand- normal Cath 04/19/14  . NSTEMI (non-ST elevated myocardial infarction) ([place]) 01-24-15  . Last menstrual period (LMP) > 10 days ago     LMP 2014    PAST SURGICAL HISTORY: Past Surgical History  Procedure Laterality Date  . Back surgery      L4-5  . Knee surgery    . Cesarean section    . Coronary angioplasty  April 2016  . Retinal detachment surgery Right 1997  . Foot surgery    . Breast lumpectomy with sentinel lymph node biopsy Left 08/13/2014    Procedure: BREAST LUMPECTOMY WITH SENTINEL LYMPH NODE BIOPSY;  Surgeon: Robert Bellow, MD;  Location: [facility] ORS;  Service: General;  Laterality: Left;  . Breast mammosite Left 08/30/2014    Procedure: MAMMOSITE BREAST;  Surgeon: Robert Bellow, MD;  Location: [facility] ORS;  Service: General;  Laterality: Left;  . Portacath placement Right 08/30/2014  Procedure: INSERTION PORT-A-CATH;  Surgeon: Robert Bellow, MD;  Location: [facility] ORS;  Service: General;  Laterality: Right;    FAMILY HISTORY Family History  Problem Relation Age of Onset  . Alcohol abuse Mother   . Thyroid disease Mother   . Hypertension Mother   . COPD Mother   . Cancer Father     liposarcoma  . Congestive Heart Failure Father     V Tach  . Depression Father   . Hypertension Father   . Cancer Maternal Uncle     Pancreatic cancer  . Heart disease Paternal Grandfather  44    MI  . Diabetes Other   . Cancer Sister     breast  . Breast cancer Sister 7       ADVANCED DIRECTIVES:    HEALTH MAINTENANCE: Social History  Substance Use Topics  . Smoking status: Never Smoker   . Smokeless tobacco: Never Used  . Alcohol Use: No    Allergies  Allergen Reactions  . Reglan [Metoclopramide] Shortness Of Breath and Other (See Comments)    This medication caused a dystonic reaction.    Marland Kitchen Paxil [Paroxetine Hcl] Other (See Comments)    Reaction:  Blurred vision  . Tegaderm Ag Mesh [Silver] Rash  . Ultram [Tramadol] Itching and Rash    Current Outpatient Prescriptions  Medication Sig Dispense Refill  . acetaminophen (TYLENOL) 500 MG tablet Take 1,000 mg by mouth every 6 (six) hours as needed for mild pain or headache.    Marland Kitchen aspirin EC 81 MG tablet Take 81 mg by mouth daily.    . cholecalciferol (VITAMIN D) 1000 UNITS tablet Take 1,000 Units by mouth daily.    . diphenhydrAMINE (BENADRYL) 25 MG tablet Take 25 mg by mouth every 6 (six) hours as needed for allergies.     Marland Kitchen escitalopram (LEXAPRO) 20 MG tablet Take 20 mg by mouth daily.    . hydrALAZINE (APRESOLINE) 25 MG tablet Take 25 mg by mouth 2 (two) times daily.    Marland Kitchen HYDROcodone-acetaminophen (NORCO) 5-325 MG tablet Take 1 tablet by mouth every 6 (six) hours as needed for moderate pain. 15 tablet 0  . levothyroxine (SYNTHROID, LEVOTHROID) 100 MCG tablet Take 1 tablet (100 mcg total) by mouth daily. 30 tablet 11  . lidocaine-prilocaine (EMLA) cream Apply 1 application topically as needed (prior to accessing port).    . metoprolol succinate (TOPROL-XL) 100 MG 24 hr tablet Take 100 mg by mouth daily. Take with or immediately following a meal.    . Multiple Vitamin (MULTIVITAMIN WITH MINERALS) TABS tablet Take 1 tablet by mouth daily.    . ranitidine (ZANTAC) 300 MG tablet TAKE ONE TABLET BY MOUTH AT BEDTIME* REPLACES OMEPRAZOLE* 30 tablet 2  . simvastatin (ZOCOR) 10 MG tablet Take 10 mg by mouth daily.      . vitamin B-12 (CYANOCOBALAMIN) 1000 MCG tablet Take 1,000 mcg by mouth daily.     No current facility-administered medications for this visit.   Facility-Administered Medications Ordered in Other Visits  Medication Dose Route Frequency Provider Last Rate Last Dose  . 0.9 %  sodium chloride infusion   Intravenous Once Lloyd Huger, MD      . acetaminophen (TYLENOL) tablet 650 mg  650 mg Oral Once Lloyd Huger, MD      . diphenhydrAMINE (BENADRYL) capsule 50 mg  50 mg Oral Once Lloyd Huger, MD      . heparin lock flush 100 unit/mL  500 Units Intracatheter Once  PRN Lloyd Huger, MD      . sodium chloride 0.9 % injection 10 mL  10 mL Intracatheter PRN Lloyd Huger, MD   10 mL at 11/30/14 1205  . sodium chloride 0.9 % injection 10 mL  10 mL Intracatheter PRN Lloyd Huger, MD      . trastuzumab (HERCEPTIN) 651 mg in sodium chloride 0.9 % 250 mL chemo infusion  6 mg/kg (Treatment Plan Actual) Intravenous Once Lloyd Huger, MD        OBJECTIVE: Filed Vitals:   05/10/15 1445  BP: 121/82  Pulse: 61  Temp: 97.6 F (36.4 C)  Resp: 16     Body mass index is 38.19 kg/(m^2).    ECOG FS:0 - Asymptomatic  General: Well-developed, well-nourished, no acute distress. Eyes: Pink conjunctiva, anicteric sclera. Breasts: Exam deferred today. Lungs: Clear to auscultation bilaterally. Heart: Regular rate and rhythm. No rubs, murmurs, or gallops. Abdomen: Soft, nontender, nondistended. No organomegaly noted, normoactive bowel sounds. Musculoskeletal: No edema, cyanosis, or clubbing. Neuro: Alert, answering all questions appropriately. Cranial nerves grossly intact. Skin: No rashes or petechiae noted. Psych: Normal affect.   LAB RESULTS:  Lab Results  Component Value Date   NA 136 04/19/2015   K 3.8 04/19/2015   CL 104 04/19/2015   CO2 26 04/19/2015   GLUCOSE 94 04/19/2015   BUN 20 04/19/2015   CREATININE 1.00 04/19/2015   CALCIUM 8.7* 04/19/2015    PROT 6.5 03/08/2015   ALBUMIN 3.8 03/08/2015   AST 21 03/08/2015   ALT 18 03/08/2015   ALKPHOS 78 03/08/2015   BILITOT 1.1 03/08/2015   GFRNONAA >60 04/19/2015   GFRAA >60 04/19/2015    Lab Results  Component Value Date   WBC 6.3 05/10/2015   NEUTROABS 3.7 05/10/2015   HGB 10.3* 05/10/2015   HCT 30.3* 05/10/2015   MCV 85.9 05/10/2015   PLT 200 05/10/2015     STUDIES: Mr Jeri Cos NL Contrast  May 26, 2015  CLINICAL DATA:  Personal history of breast cancer July 2016. Recent headaches, visual changes, and abnormal balance over the last 3 months. EXAM: MRI HEAD WITHOUT AND WITH CONTRAST TECHNIQUE: Multiplanar, multiecho pulse sequences of the brain and surrounding structures were obtained without and with intravenous contrast. CONTRAST:  78m MULTIHANCE GADOBENATE DIMEGLUMINE 529 MG/ML IV SOLN COMPARISON:  CT head without contrast 12/03/2010. FINDINGS: No acute infarct, hemorrhage, or mass lesion is present. The ventricles are of normal size. No significant extraaxial fluid collection is present. No significant white matter disease is present. The internal auditory canals are within normal limits bilaterally. The brainstem and cerebellum are normal. Flow is present in the major intracranial arteries. The globes and orbits are intact. The paranasal sinuses and the mastoid air cells are clear. Skullbase is within normal limits. Midline sagittal images are unremarkable. The postcontrast images demonstrate no pathologic enhancement. Incidental note is made of a a residual occipital venous sinus on the right, a normal variant. IMPRESSION: 1. No evidence for metastatic disease to the brain. 2. Normal MRI of the brain for age. Electronically Signed   By: CSan MorelleM.D.   On: 011-May-201710:29   Nm Cardiac Muga Rest  4May 11, 2017 CLINICAL DATA:  Breast cancer. EXAM: NUCLEAR MEDICINE CARDIAC BLOOD POOL IMAGING (MUGA) TECHNIQUE: Cardiac multi-gated acquisition was performed at rest following  intravenous injection of Tc-963mabeled red blood cells. RADIOPHARMACEUTICALS:  20.97 mCi Tc-9927mP in-vitro labeled red blood cells IV COMPARISON:  02/11/2015 FINDINGS: The left ventricular ejection fraction is  equal to 60.5%. There is normal left ventricular wall motion. On the previous exam the ejection fraction was equal to 52.7%. IMPRESSION: 1. Normal left ventricular ejection fraction equal to 60.5%. This has increased from 52.7% previously. Electronically Signed   By: Kerby Moors M.D.   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  PLAN:    1. Breast cancer: Given the fact that patient is HER-2 positive, she will benefit from adjuvant chemotherapy. Patient completed MammoSite radiation. Patient's MUGA scan from April 29, 2015 was improved to 60.5% from the 53% in January 2017. Repeat MUGA in July 2017.  Given patient's persistent side effects, chemotherapy was discontinued altogether after 3 cycles. Proceed with cycle 11 of 18 of Herceptin only today. Return to clinic in 3 weeks for consideration of cycle 12. She does not require an aromatase inhibitor given the fact that her tumor is ER/PR negative.   2. Anemia: HGB 10.3 today. Monitor 3. Hypertension: Resolved. Continue treatment per cardiology. 4. Headaches: MRI April 29, 2015 was normal.  5. Hypocalcemia: Take calcium/vitamin d twice daily. 6. Back pain: Unclear etiology. Monitor.  Patient expressed understanding and was in agreement with this plan. She also understands that She can call clinic at any time with any questions, concerns, or complaints.   Breast cancer, female   Staging form: Breast, AJCC 7th Edition     Pathologic stage from 08/17/2014: Stage IA (T1c, N0, cM0) - Signed by Lloyd Huger, MD on 08/17/2014   Mayra Reel, NP   05/10/2015 3:17 PM    Patient was seen and evaluated independently and I agree with the assessment and plan as dictated  above.  Lloyd Huger, MD 05/11/2015 8:57 AM

## 2015-05-10 NOTE — Progress Notes (Signed)
Radiation Oncology Follow up Note  Name: Robin Howard   Date:   05/10/2015 MRN:  [id number] DOB: [birth year]    This 55 y.o. female presents to the clinic today for follow-up for breast cancer. Stage I ER/PR negative HER-2/neu overexpressed status post accelerated partial breast radiation partial course of chemotherapy and Herceptin.  REFERRING PROVIDER: Robert Bellow, MD  HPI: Patient is a 55 year old female now out 6 months having completed accelerated partial breast irradiation to her left breast for a T1 CN 0 M0 ER/PR negative HER-2/neu overexpressed invasive carcinoma. She seen today in routine follow-up is doing fairly well. She had continued on Herceptin had an abbreviated course of chemotherapy which was discontinued secondary to side effects.. She had a nodular density after completion of accelerated partial breast radiation had a fine-needle aspiration about which was benign. Being followed by mammogram in 6 months. She specifically denies breast tenderness cough or bone pain. She continues on maintenance Herceptin at this time  COMPLICATIONS OF TREATMENT: none  FOLLOW UP COMPLIANCE: keeps appointments   PHYSICAL EXAM:  BP 130/81 mmHg  Pulse 68  Temp(Src) 97.6 F (36.4 C)  Wt 229 lb 8 oz (104.1 kg) Lungs are clear to A&P cardiac examination essentially unremarkable with regular rate and rhythm. No dominant mass or nodularity is noted in either breast in 2 positions examined. Incision is well-healed. No axillary or supraclavicular adenopathy is appreciated. Cosmetic result is excellent. Patient has a Port-A-Cath placed in the right anterior chest wall Well-developed well-nourished patient in NAD. HEENT reveals PERLA, EOMI, discs not visualized.  Oral cavity is clear. No oral mucosal lesions are identified. Neck is clear without evidence of cervical or supraclavicular adenopathy. Lungs are clear to A&P. Cardiac examination is essentially unremarkable with regular rate and rhythm  without murmur rub or thrill. Abdomen is benign with no organomegaly or masses noted. Motor sensory and DTR levels are equal and symmetric in the upper and lower extremities. Cranial nerves II through XII are grossly intact. Proprioception is intact. No peripheral adenopathy or edema is identified. No motor or sensory levels are noted. Crude visual fields are within normal range. RADIOLOGY RESULTS: Previous mammogram reviewed  PLAN: Present time she is doing well with no evidence of disease. I've asked to see her back in 6 months for follow-up. She continues Herceptin maintenance. Continues close close follow-up care with surgeon. I have asked to see her back in 6 months as described above. Patient is to call sooner with any concerns.  I would like to take this opportunity for allowing me to participate in the care of your patient.Armstead Peaks., MD

## 2015-05-10 NOTE — Progress Notes (Signed)
Back pain is stable and is 3/10 on pain scale.  The pain is worse when lying down at night.  Still having headaches that have not changed.

## 2015-05-20 ENCOUNTER — Telehealth: Payer: Self-pay | Admitting: *Deleted

## 2015-05-20 NOTE — Telephone Encounter (Signed)
Started Calcium and has had a HA ever since, asking if she is to continue it

## 2015-05-20 NOTE — Telephone Encounter (Signed)
Unlikely related to Ca++, but ok to stop.

## 2015-05-20 NOTE — Telephone Encounter (Signed)
Returned call to pt and advised of Dr Gary Fleet advise and she stated if he does not think it is the Ca+ she will continue taking it

## 2015-05-26 ENCOUNTER — Encounter: Payer: Self-pay | Admitting: Cardiovascular Disease

## 2015-05-26 ENCOUNTER — Ambulatory Visit (INDEPENDENT_AMBULATORY_CARE_PROVIDER_SITE_OTHER): Payer: Medicaid Other | Admitting: Cardiovascular Disease

## 2015-05-26 VITALS — BP 129/87 | HR 62 | Ht 65.0 in | Wt 229.2 lb

## 2015-05-26 DIAGNOSIS — I2510 Atherosclerotic heart disease of native coronary artery without angina pectoris: Secondary | ICD-10-CM | POA: Diagnosis not present

## 2015-05-26 NOTE — Patient Instructions (Signed)
Medication Instructions: Continue same medications.   Labwork: None.   Procedures/Testing: None.   Follow-Up: 6 months with Dr. Charyl Minervini.   Any Additional Special Instructions Will Be Listed Below (If Applicable).     If you need a refill on your cardiac medications before your next appointment, please call your pharmacy.   

## 2015-05-26 NOTE — Progress Notes (Signed)
Cardiology Office Note   Date:  05/26/2015   ID:  [name], DOB 28-Apr-1960, MRN [id number]  PCP:  Enid Derry, MD  Cardiologist:   Kathlyn Sacramento, MD   Chief Complaint  Patient presents with  . other    No complaints today. Meds reviewed verbally with pt.      History of Present Illness: Robin Howard is a 55 y.o. female who Is transferring care from [place]. She had 2 episodes of chest pain with mildly elevated troponin without evidence of obstructive coronary artery disease. Initial presentation was in April 2016 in the setting of bronchitis. Cardiac catheterization showed mild nonobstructive LAD disease. Ejection fraction was normal. She was diagnosed with breast cancer in July and underwent lumpectomy followed by chemotherapy. Currently she is on Herceptin. Most recent MUGA scan was in April 2017 which showed an ejection fraction of 60%. Blood pressure was elevated few months ago and she was placed on metoprolol and small dose hydralazine. She was also started on small dose simvastatin. She has been doing very well with no recurrent chest pain, shortness of breath or other cardiac symptoms. She is not a smoker. Family history is negative for premature coronary artery disease.    Past Medical History  Diagnosis Date  . Major depressive disorder, recurrent ([place])   . HTN (hypertension)   . Overactive bladder   . Migraines   . Gastritis   . Hypothyroidism   . OSA (obstructive sleep apnea)     untreated due to lack of insurance  . Insomnia   . GERD (gastroesophageal reflux disease)   . Pericarditis 1997  . Detached retina 1997  . Chronic headache   . Morbid obesity ([place])   . Breast cancer ([place])   . Bronchitis 04-2014  . Shortness of breath dyspnea     with exertion only  . Anemia     h/o  . Pericarditis 1997  . Family history of adverse reaction to anesthesia     pts dad has pseudocholinestrace deficieny  . NSTEMI (non-ST elevated myocardial infarction) ([place])  04/16/14    Due to demand- normal Cath 04/19/14  . NSTEMI (non-ST elevated myocardial infarction) ([place]) 01-24-15  . Last menstrual period (LMP) > 10 days ago     LMP 2014  . Coronary artery disease      small non-ST elevation myocardial infarction in April 2016 in January 2017. Cardiac catheterization in April 2016 showed mild nonobstructive disease affecting the LAD with tortuous vessels. CTA in 2017 showed only 30% disease in the LAD.    Past Surgical History  Procedure Laterality Date  . Back surgery      L4-5  . Knee surgery    . Cesarean section    . Retinal detachment surgery Right 1997  . Foot surgery    . Breast lumpectomy with sentinel lymph node biopsy Left 08/13/2014    Procedure: BREAST LUMPECTOMY WITH SENTINEL LYMPH NODE BIOPSY;  Surgeon: Robert Bellow, MD;  Location: [facility] ORS;  Service: General;  Laterality: Left;  . Breast mammosite Left 08/30/2014    Procedure: MAMMOSITE BREAST;  Surgeon: Robert Bellow, MD;  Location: [facility] ORS;  Service: General;  Laterality: Left;  . Portacath placement Right 08/30/2014    Procedure: INSERTION PORT-A-CATH;  Surgeon: Robert Bellow, MD;  Location: [facility] ORS;  Service: General;  Laterality: Right;  . Coronary angioplasty  April 2016     Current Outpatient Prescriptions  Medication Sig Dispense Refill  . acetaminophen (  TYLENOL) 500 MG tablet Take 1,000 mg by mouth every 6 (six) hours as needed for mild pain or headache.    Marland Kitchen aspirin EC 81 MG tablet Take 81 mg by mouth daily.    Marland Kitchen CALCIUM PO Take by mouth daily.    . cholecalciferol (VITAMIN D) 1000 UNITS tablet Take 1,000 Units by mouth daily.    . diphenhydrAMINE (BENADRYL) 25 MG tablet Take 25 mg by mouth every 6 (six) hours as needed for allergies.     Marland Kitchen escitalopram (LEXAPRO) 20 MG tablet Take 20 mg by mouth daily.    . hydrALAZINE (APRESOLINE) 25 MG tablet Take 25 mg by mouth 2 (two) times daily.    Marland Kitchen HYDROcodone-acetaminophen (NORCO) 5-325 MG tablet Take 1 tablet by mouth  every 6 (six) hours as needed for moderate pain. 15 tablet 0  . levothyroxine (SYNTHROID, LEVOTHROID) 100 MCG tablet Take 1 tablet (100 mcg total) by mouth daily. 30 tablet 11  . lidocaine-prilocaine (EMLA) cream Apply 1 application topically as needed (prior to accessing port).    . metoprolol succinate (TOPROL-XL) 100 MG 24 hr tablet Take 100 mg by mouth daily. Take with or immediately following a meal.    . Multiple Vitamin (MULTIVITAMIN WITH MINERALS) TABS tablet Take 1 tablet by mouth daily.    . ranitidine (ZANTAC) 300 MG tablet TAKE ONE TABLET BY MOUTH AT BEDTIME* REPLACES OMEPRAZOLE* 30 tablet 2  . simvastatin (ZOCOR) 10 MG tablet Take 10 mg by mouth daily.    . vitamin B-12 (CYANOCOBALAMIN) 1000 MCG tablet Take 1,000 mcg by mouth daily.     No current facility-administered medications for this visit.   Facility-Administered Medications Ordered in Other Visits  Medication Dose Route Frequency Provider Last Rate Last Dose  . sodium chloride 0.9 % injection 10 mL  10 mL Intracatheter PRN Lloyd Huger, MD   10 mL at 11/30/14 1205    Allergies:   Reglan; Paxil; Tegaderm ag mesh; and Ultram    Social History:  The patient  reports that she has never smoked. She has never used smokeless tobacco. She reports that she does not drink alcohol or use illicit drugs.   Family History:  The patient's family history includes Alcohol abuse in her mother; Arrhythmia in her father; Breast cancer (age of onset: 32) in her sister; COPD in her mother; Cancer in her father, maternal uncle, and sister; Congestive Heart Failure in her father; Depression in her father; Diabetes in her other; Heart attack in her father; Heart disease (age of onset: 40) in her paternal grandfather; Heart failure in her father; Hypertension in her father and mother; Thyroid disease in her mother.    ROS:  Please see the history of present illness.   Otherwise, review of systems are positive for none.   All other systems  are reviewed and negative.    PHYSICAL EXAM: VS:  BP 129/87 mmHg  Pulse 62  Ht 5\' 5"  (1.651 m)  Wt 229 lb 4 oz (103.987 kg)  BMI 38.15 kg/m2 , BMI Body mass index is 38.15 kg/(m^2). GEN: Well nourished, well developed, in no acute distress HEENT: normal Neck: no JVD, carotid bruits, or masses Cardiac: RRR; no murmurs, rubs, or gallops,no edema  Respiratory:  clear to auscultation bilaterally, normal work of breathing GI: soft, nontender, nondistended, + BS MS: no deformity or atrophy Skin: warm and dry, no rash Neuro:  Strength and sensation are intact Psych: euthymic mood, full affect   EKG:  EKG is ordered  today. The ekg ordered today demonstrates normal sinus rhythm with nonspecific ST and T wave changes.   Recent Labs: 07/15/2014: TSH 1.920 03/08/2015: ALT 18 05/10/2015: BUN 18; Creatinine, Ser 1.10*; Hemoglobin 10.3*; Platelets 200; Potassium 3.9; Sodium 139    Lipid Panel    Component Value Date/Time   CHOL 132 04/17/2014 0444   TRIG 115 04/17/2014 0444   HDL 37* 04/17/2014 0444   VLDL 23 04/17/2014 0444   LDLCALC 72 04/17/2014 0444      Wt Readings from Last 3 Encounters:  05/26/15 229 lb 4 oz (103.987 kg)  05/10/15 229 lb 8 oz (104.1 kg)  05/10/15 229 lb 8 oz (104.1 kg)         ASSESSMENT AND PLAN:  1.  Coronary atherosclerosis without obstructive coronary artery disease: Currently with no anginal symptoms. Recommend continuing aggressive medical therapy. Continue low-dose aspirin.  2. Essential hypertension: Blood pressure is well controlled on current medications. I might consider switching hydralazine to an ARB to avoid twice a day dosing.  3. Hyperlipidemia: She was placed on simvastatin due to coronary atherosclerosis but her LDL was not elevated.    Disposition:   FU with me in 6 months  Signed,  Kathlyn Sacramento, MD  05/26/2015 9:15 AM    [place]

## 2015-05-31 ENCOUNTER — Inpatient Hospital Stay (HOSPITAL_BASED_OUTPATIENT_CLINIC_OR_DEPARTMENT_OTHER): Payer: Medicaid Other | Admitting: Oncology

## 2015-05-31 ENCOUNTER — Inpatient Hospital Stay: Payer: Medicaid Other | Attending: Oncology

## 2015-05-31 ENCOUNTER — Inpatient Hospital Stay: Payer: Medicaid Other

## 2015-05-31 VITALS — BP 112/76 | HR 62 | Temp 99.0°F | Resp 18 | Wt 233.9 lb

## 2015-05-31 DIAGNOSIS — R51 Headache: Secondary | ICD-10-CM | POA: Insufficient documentation

## 2015-05-31 DIAGNOSIS — G4700 Insomnia, unspecified: Secondary | ICD-10-CM | POA: Insufficient documentation

## 2015-05-31 DIAGNOSIS — Z803 Family history of malignant neoplasm of breast: Secondary | ICD-10-CM | POA: Insufficient documentation

## 2015-05-31 DIAGNOSIS — E039 Hypothyroidism, unspecified: Secondary | ICD-10-CM | POA: Insufficient documentation

## 2015-05-31 DIAGNOSIS — E669 Obesity, unspecified: Secondary | ICD-10-CM | POA: Diagnosis not present

## 2015-05-31 DIAGNOSIS — I2510 Atherosclerotic heart disease of native coronary artery without angina pectoris: Secondary | ICD-10-CM | POA: Insufficient documentation

## 2015-05-31 DIAGNOSIS — Z7982 Long term (current) use of aspirin: Secondary | ICD-10-CM | POA: Diagnosis not present

## 2015-05-31 DIAGNOSIS — Z79899 Other long term (current) drug therapy: Secondary | ICD-10-CM | POA: Insufficient documentation

## 2015-05-31 DIAGNOSIS — R0981 Nasal congestion: Secondary | ICD-10-CM

## 2015-05-31 DIAGNOSIS — G4730 Sleep apnea, unspecified: Secondary | ICD-10-CM

## 2015-05-31 DIAGNOSIS — I252 Old myocardial infarction: Secondary | ICD-10-CM

## 2015-05-31 DIAGNOSIS — K219 Gastro-esophageal reflux disease without esophagitis: Secondary | ICD-10-CM | POA: Diagnosis not present

## 2015-05-31 DIAGNOSIS — N3281 Overactive bladder: Secondary | ICD-10-CM | POA: Insufficient documentation

## 2015-05-31 DIAGNOSIS — I10 Essential (primary) hypertension: Secondary | ICD-10-CM | POA: Diagnosis not present

## 2015-05-31 DIAGNOSIS — D649 Anemia, unspecified: Secondary | ICD-10-CM

## 2015-05-31 DIAGNOSIS — F329 Major depressive disorder, single episode, unspecified: Secondary | ICD-10-CM

## 2015-05-31 DIAGNOSIS — Z171 Estrogen receptor negative status [ER-]: Secondary | ICD-10-CM | POA: Insufficient documentation

## 2015-05-31 DIAGNOSIS — Z8679 Personal history of other diseases of the circulatory system: Secondary | ICD-10-CM

## 2015-05-31 DIAGNOSIS — Z5112 Encounter for antineoplastic immunotherapy: Secondary | ICD-10-CM | POA: Diagnosis present

## 2015-05-31 DIAGNOSIS — R05 Cough: Secondary | ICD-10-CM | POA: Insufficient documentation

## 2015-05-31 DIAGNOSIS — C50912 Malignant neoplasm of unspecified site of left female breast: Secondary | ICD-10-CM | POA: Insufficient documentation

## 2015-05-31 DIAGNOSIS — R059 Cough, unspecified: Secondary | ICD-10-CM

## 2015-05-31 LAB — COMPREHENSIVE METABOLIC PANEL
ALBUMIN: 4 g/dL (ref 3.5–5.0)
ALK PHOS: 86 U/L (ref 38–126)
ALT: 19 U/L (ref 14–54)
ANION GAP: 6 (ref 5–15)
AST: 23 U/L (ref 15–41)
BILIRUBIN TOTAL: 1 mg/dL (ref 0.3–1.2)
BUN: 19 mg/dL (ref 6–20)
CALCIUM: 8.7 mg/dL — AB (ref 8.9–10.3)
CO2: 27 mmol/L (ref 22–32)
Chloride: 106 mmol/L (ref 101–111)
Creatinine, Ser: 1.19 mg/dL — ABNORMAL HIGH (ref 0.44–1.00)
GFR calc non Af Amer: 51 mL/min — ABNORMAL LOW (ref >60)
GFR, EST AFRICAN AMERICAN: 59 mL/min — AB (ref >60)
GLUCOSE: 112 mg/dL — AB (ref 65–99)
POTASSIUM: 3.9 mmol/L (ref 3.5–5.1)
SODIUM: 139 mmol/L (ref 135–145)
TOTAL PROTEIN: 6.6 g/dL (ref 6.5–8.1)

## 2015-05-31 LAB — CBC WITH DIFFERENTIAL/PLATELET
BASOS PCT: 2 %
Basophils Absolute: 0.1 10*3/uL (ref 0–0.1)
EOS ABS: 0.2 10*3/uL (ref 0–0.7)
Eosinophils Relative: 4 %
HCT: 29.6 % — ABNORMAL LOW (ref 35.0–47.0)
Hemoglobin: 10.3 g/dL — ABNORMAL LOW (ref 12.0–16.0)
LYMPHS ABS: 1.6 10*3/uL (ref 1.0–3.6)
Lymphocytes Relative: 27 %
MCH: 30.2 pg (ref 26.0–34.0)
MCHC: 34.6 g/dL (ref 32.0–36.0)
MCV: 87.2 fL (ref 80.0–100.0)
MONO ABS: 0.8 10*3/uL (ref 0.2–0.9)
MONOS PCT: 13 %
Neutro Abs: 3.2 10*3/uL (ref 1.4–6.5)
Neutrophils Relative %: 54 %
Platelets: 188 10*3/uL (ref 150–440)
RBC: 3.4 MIL/uL — ABNORMAL LOW (ref 3.80–5.20)
RDW: 16.4 % — AB (ref 11.5–14.5)
WBC: 5.9 10*3/uL (ref 3.6–11.0)

## 2015-05-31 MED ORDER — TRASTUZUMAB CHEMO INJECTION 440 MG
6.0000 mg/kg | Freq: Once | INTRAVENOUS | Status: AC
  Administered 2015-05-31: 651 mg via INTRAVENOUS
  Filled 2015-05-31: qty 31

## 2015-05-31 MED ORDER — DIPHENHYDRAMINE HCL 25 MG PO CAPS
50.0000 mg | ORAL_CAPSULE | Freq: Once | ORAL | Status: AC
  Administered 2015-05-31: 50 mg via ORAL

## 2015-05-31 MED ORDER — SODIUM CHLORIDE 0.9 % IV SOLN
Freq: Once | INTRAVENOUS | Status: AC
  Administered 2015-05-31: 15:00:00 via INTRAVENOUS
  Filled 2015-05-31: qty 1000

## 2015-05-31 MED ORDER — HEPARIN SOD (PORK) LOCK FLUSH 100 UNIT/ML IV SOLN
500.0000 [IU] | Freq: Once | INTRAVENOUS | Status: AC | PRN
  Administered 2015-05-31: 500 [IU]
  Filled 2015-05-31: qty 5

## 2015-05-31 MED ORDER — BENZONATATE 100 MG PO CAPS: 100.0000 mg | ORAL_CAPSULE | Freq: Three times a day (TID) | ORAL | Status: DC | PRN

## 2015-05-31 MED ORDER — ACETAMINOPHEN 325 MG PO TABS
650.0000 mg | ORAL_TABLET | Freq: Once | ORAL | Status: AC
  Administered 2015-05-31: 650 mg via ORAL
  Filled 2015-05-31: qty 2

## 2015-05-31 NOTE — Progress Notes (Signed)
Since last Thursday patient has been having a runny nose, cough, chest congestion, and sore throat being the worse symptom.  Headache was worse after the last treatment.

## 2015-05-31 NOTE — Progress Notes (Signed)
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  Chief Complaint  Patient presents with  . Breast Cancer    INTERVAL HISTORY:  Patient returns to clinic today for further evaluation and consideration of cycle 12 of Herceptin only. She continues to have persistent headaches. She also has had sore throat, cough, congestion and runny nose for the past week but no fever. She has been taking benadryl without relief. She constipation or diarrhea. She has no other neurologic complaints. She denies any recent fevers. She has a good appetite and denies weight loss. She has no chest pain or shortness of breath. She has no urinary complaints. Patient offers no specific complaints today.  REVIEW OF SYSTEMS:   Review of Systems  Constitutional: Negative.  Negative for fever and malaise/fatigue.  HENT: Positive for congestion and sore throat.   Eyes: Negative for blurred vision.  Respiratory: Positive for cough.   Cardiovascular: Negative.   Gastrointestinal: Negative for nausea, abdominal pain and diarrhea.  Genitourinary: Negative.   Musculoskeletal: Negative for back pain.  Neurological: Positive for headaches. Negative for weakness.  Psychiatric/Behavioral: Negative.     As per HPI. Otherwise, a complete review of systems is negatve.  PAST MEDICAL HISTORY: Past Medical History  Diagnosis Date  . Major depressive disorder, recurrent ([place])   . HTN (hypertension)   . Overactive bladder   . Migraines   . Gastritis   . Hypothyroidism   . OSA (obstructive sleep apnea)      untreated due to lack of insurance  . Insomnia   . GERD (gastroesophageal reflux disease)   . Pericarditis 1997  . Detached retina 1997  . Chronic headache   . Morbid obesity ([place])   . Breast cancer ([place])   . Bronchitis 04-2014  . Shortness of breath dyspnea     with exertion only  . Anemia     h/o  . Pericarditis 1997  . Family history of adverse reaction to anesthesia     pts dad has pseudocholinestrace deficieny  . NSTEMI (non-ST elevated myocardial infarction) ([place]) 04/16/14    Due to demand- normal Cath 04/19/14  . NSTEMI (non-ST elevated myocardial infarction) ([place]) 01-24-15  . Last menstrual period (LMP) > 10 days ago     LMP 2014  . Coronary artery disease      small non-ST elevation myocardial infarction in April 2016 in January 2017. Cardiac catheterization in April 2016 showed mild nonobstructive disease affecting the LAD with tortuous vessels. CTA in 2017 showed only 30% disease in the LAD.    PAST SURGICAL HISTORY: Past Surgical History  Procedure Laterality Date  . Back surgery      L4-5  . Knee surgery    . Cesarean section    . Retinal detachment surgery Right 1997  . Foot surgery    . Breast lumpectomy with sentinel lymph node biopsy Left 08/13/2014    Procedure: BREAST LUMPECTOMY WITH SENTINEL LYMPH NODE BIOPSY;  Surgeon: Robert Bellow, MD;  Location: [facility] ORS;  Service: General;  Laterality: Left;  . Breast mammosite Left 08/30/2014    Procedure: MAMMOSITE BREAST;  Surgeon: Robert Bellow, MD;  Location: [facility] ORS;  Service: General;  Laterality: Left;  . Portacath placement Right 08/30/2014    Procedure: INSERTION PORT-A-CATH;  Surgeon: Robert Bellow, MD;  Location: [facility] ORS;  Service: General;  Laterality: Right;  . Coronary angioplasty  April 2016    FAMILY HISTORY Family History  Problem Relation Age of Onset  . Alcohol abuse Mother   . Thyroid disease Mother   . Hypertension Mother   . COPD Mother   . Cancer Father     liposarcoma  .  Congestive Heart Failure Father     V Tach  . Depression Father   . Hypertension Father   . Heart attack Father   . Arrhythmia Father   . Heart failure Father   . Cancer Maternal Uncle     Pancreatic cancer  . Heart disease Paternal Grandfather 1    MI  . Diabetes Other   . Cancer Sister     breast  . Breast cancer Sister 56       ADVANCED DIRECTIVES:    HEALTH MAINTENANCE: Social History  Substance Use Topics  . Smoking status: Never Smoker   . Smokeless tobacco: Never Used  . Alcohol Use: No    Allergies  Allergen Reactions  . Reglan [Metoclopramide] Shortness Of Breath and Other (See Comments)    This medication caused a dystonic reaction.    Marland Kitchen Paxil [Paroxetine Hcl] Other (See Comments)    Reaction:  Blurred vision  . Tegaderm Ag Mesh [Silver] Rash  . Ultram [Tramadol] Itching and Rash    Current Outpatient Prescriptions  Medication Sig Dispense Refill  . acetaminophen (TYLENOL) 500 MG tablet Take 1,000 mg by mouth every 6 (six) hours as needed for mild pain or headache.    Marland Kitchen aspirin EC 81 MG tablet Take 81 mg by mouth daily.    Marland Kitchen CALCIUM PO Take by mouth daily.    . cholecalciferol (VITAMIN D) 1000 UNITS tablet Take 1,000 Units by mouth daily.    . diphenhydrAMINE (BENADRYL) 25 MG tablet Take 25 mg by mouth every 6 (six) hours as needed for allergies.     Marland Kitchen escitalopram (LEXAPRO) 20 MG tablet Take 20 mg by mouth daily.    . hydrALAZINE (APRESOLINE) 25 MG tablet Take 25 mg by mouth 2 (two) times daily.    Marland Kitchen HYDROcodone-acetaminophen (NORCO) 5-325 MG tablet Take 1 tablet by mouth every 6 (six) hours as needed for moderate pain. 15 tablet 0  . levothyroxine (SYNTHROID, LEVOTHROID) 100 MCG tablet Take 1 tablet (100 mcg total) by mouth daily. 30 tablet 11  . lidocaine-prilocaine (EMLA) cream Apply 1 application topically as needed (prior to accessing port).    . metoprolol succinate (TOPROL-XL) 100 MG 24 hr tablet Take 100 mg by mouth daily. Take with or  immediately following a meal.    . Multiple Vitamin (MULTIVITAMIN WITH MINERALS) TABS tablet Take 1 tablet by mouth daily.    . ranitidine (ZANTAC) 300 MG tablet TAKE ONE TABLET BY MOUTH AT BEDTIME* REPLACES OMEPRAZOLE* 30 tablet 2  . simvastatin (ZOCOR) 10 MG tablet Take 10 mg by mouth daily.    . vitamin B-12 (CYANOCOBALAMIN) 1000 MCG tablet Take 1,000 mcg by mouth daily.     No current facility-administered medications for this visit.   Facility-Administered Medications Ordered in Other Visits  Medication Dose Route Frequency Provider Last Rate Last Dose  . sodium chloride 0.9 % injection 10 mL  10 mL Intracatheter PRN Christia Reading  Bridget Hartshorn, MD   10 mL at 11/30/14 1205    OBJECTIVE: Filed Vitals:   05/31/15 1337  BP: 112/76  Pulse: 62  Temp: 99 F (37.2 C)  Resp: 18     Body mass index is 38.92 kg/(m^2).    ECOG FS:0 - Asymptomatic  General: Well-developed, well-nourished, no acute distress. Eyes: Pink conjunctiva, anicteric sclera. Breasts: Exam deferred today. Lungs: Clear to auscultation bilaterally. Heart: Regular rate and rhythm. No rubs, murmurs, or gallops. Abdomen: Soft, nontender, nondistended. No organomegaly noted, normoactive bowel sounds. Musculoskeletal: No edema, cyanosis, or clubbing. Neuro: Alert, answering all questions appropriately. Cranial nerves grossly intact. Skin: No rashes or petechiae noted. Psych: Normal affect.   LAB RESULTS:  Lab Results  Component Value Date   NA 139 05/31/2015   K 3.9 05/31/2015   CL 106 05/31/2015   CO2 27 05/31/2015   GLUCOSE 112* 05/31/2015   BUN 19 05/31/2015   CREATININE 1.19* 05/31/2015   CALCIUM 8.7* 05/31/2015   PROT 6.6 05/31/2015   ALBUMIN 4.0 05/31/2015   AST 23 05/31/2015   ALT 19 05/31/2015   ALKPHOS 86 05/31/2015   BILITOT 1.0 05/31/2015   GFRNONAA 51* 05/31/2015   GFRAA 59* 05/31/2015    Lab Results  Component Value Date   WBC 5.9 05/31/2015   NEUTROABS 3.2 05/31/2015   HGB 10.3* 05/31/2015    HCT 29.6* 05/31/2015   MCV 87.2 05/31/2015   PLT 188 05/31/2015     STUDIES: No results found.  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  PLAN:    1. Breast cancer: Given the fact that patient is HER-2 positive, she will benefit from adjuvant chemotherapy. Patient completed MammoSite radiation. Patient's MUGA scan from April 29, 2015 was improved to 60.5% from the 53% in January 2017. Repeat MUGA in July 2017.  Given patient's persistent side effects, chemotherapy was discontinued altogether after 3 cycles. Proceed with cycle 12 of 18 of Herceptin only today. Return to clinic in 3 weeks for consideration of cycle 13. She does not require an aromatase inhibitor given the fact that her tumor is ER/PR negative.   2. Anemia: HGB 10.3 today. Monitor 3. Cold symptoms: OTC sudafed. Tessalon Pearls for cough sent to pharmacy.  4. Headaches: MRI April 29, 2015 was normal.  5. Hypocalcemia: Take calcium/vitamin d twice daily. 6. Creatinine: 1.19 today. Increase oral fluid intake. Monitor  Patient expressed understanding and was in agreement with this plan. She also understands that She can call clinic at any time with any questions, concerns, or complaints.   Breast cancer, female   Staging form: Breast, AJCC 7th Edition     Pathologic stage from 08/17/2014: Stage IA (T1c, N0, cM0) - Signed by Lloyd Huger, MD on 08/17/2014   Mayra Reel, NP   05/31/2015 2:01 PM   Patient was seen and evaluated independently and I agree with the assessment and plan as dictated above.  Lloyd Huger, MD 06/03/2015 11:27 AM

## 2015-06-01 ENCOUNTER — Other Ambulatory Visit: Payer: Medicaid Other

## 2015-06-01 ENCOUNTER — Ambulatory Visit: Payer: Medicaid Other

## 2015-06-01 ENCOUNTER — Ambulatory Visit: Payer: Medicaid Other | Admitting: Oncology

## 2015-06-01 NOTE — Progress Notes (Signed)
  Oncology Nurse Navigator Documentation  Navigator Location: [facility] (06/01/15 1400) Navigator Encounter Type: Follow-up Appt (06/01/15 1400)           Patient Visit Type: Follow-up (06/01/15 1400)   Barriers/Navigation Needs: Financial ([place] renewal) (06/01/15 1400) Education: Concerns with Finances/ Eligibility (06/01/15 1400)              Acuity: Level 1 (06/01/15 1400) Acuity Level 1: Minimal follow up required (06/01/15 1400)       Time Spent with Patient: 30 (06/01/15 1400)   Patient requests renewal of [employer].  Herceptin continues.  Recertification filled out, and faxed to [employer].

## 2015-06-09 ENCOUNTER — Other Ambulatory Visit: Payer: Self-pay

## 2015-06-09 DIAGNOSIS — C50912 Malignant neoplasm of unspecified site of left female breast: Secondary | ICD-10-CM

## 2015-06-21 ENCOUNTER — Inpatient Hospital Stay (HOSPITAL_BASED_OUTPATIENT_CLINIC_OR_DEPARTMENT_OTHER): Payer: Medicaid Other | Admitting: Oncology

## 2015-06-21 ENCOUNTER — Inpatient Hospital Stay: Payer: Medicaid Other | Attending: Oncology

## 2015-06-21 ENCOUNTER — Inpatient Hospital Stay: Payer: Medicaid Other

## 2015-06-21 VITALS — BP 133/87 | HR 70 | Temp 98.5°F | Resp 16 | Wt 235.9 lb

## 2015-06-21 DIAGNOSIS — I2510 Atherosclerotic heart disease of native coronary artery without angina pectoris: Secondary | ICD-10-CM

## 2015-06-21 DIAGNOSIS — G4730 Sleep apnea, unspecified: Secondary | ICD-10-CM | POA: Insufficient documentation

## 2015-06-21 DIAGNOSIS — Z7982 Long term (current) use of aspirin: Secondary | ICD-10-CM | POA: Insufficient documentation

## 2015-06-21 DIAGNOSIS — Z170 Estrogen receptor positive status [ER+]: Secondary | ICD-10-CM | POA: Insufficient documentation

## 2015-06-21 DIAGNOSIS — Z8669 Personal history of other diseases of the nervous system and sense organs: Secondary | ICD-10-CM

## 2015-06-21 DIAGNOSIS — E669 Obesity, unspecified: Secondary | ICD-10-CM

## 2015-06-21 DIAGNOSIS — R51 Headache: Secondary | ICD-10-CM

## 2015-06-21 DIAGNOSIS — I10 Essential (primary) hypertension: Secondary | ICD-10-CM | POA: Diagnosis not present

## 2015-06-21 DIAGNOSIS — R0602 Shortness of breath: Secondary | ICD-10-CM | POA: Insufficient documentation

## 2015-06-21 DIAGNOSIS — K219 Gastro-esophageal reflux disease without esophagitis: Secondary | ICD-10-CM

## 2015-06-21 DIAGNOSIS — C50912 Malignant neoplasm of unspecified site of left female breast: Secondary | ICD-10-CM

## 2015-06-21 DIAGNOSIS — F329 Major depressive disorder, single episode, unspecified: Secondary | ICD-10-CM | POA: Insufficient documentation

## 2015-06-21 DIAGNOSIS — R059 Cough, unspecified: Secondary | ICD-10-CM

## 2015-06-21 DIAGNOSIS — D649 Anemia, unspecified: Secondary | ICD-10-CM | POA: Insufficient documentation

## 2015-06-21 DIAGNOSIS — I252 Old myocardial infarction: Secondary | ICD-10-CM | POA: Diagnosis not present

## 2015-06-21 DIAGNOSIS — R944 Abnormal results of kidney function studies: Secondary | ICD-10-CM | POA: Diagnosis not present

## 2015-06-21 DIAGNOSIS — Z79899 Other long term (current) drug therapy: Secondary | ICD-10-CM

## 2015-06-21 DIAGNOSIS — E039 Hypothyroidism, unspecified: Secondary | ICD-10-CM | POA: Insufficient documentation

## 2015-06-21 DIAGNOSIS — R05 Cough: Secondary | ICD-10-CM

## 2015-06-21 DIAGNOSIS — Z5112 Encounter for antineoplastic immunotherapy: Secondary | ICD-10-CM | POA: Diagnosis not present

## 2015-06-21 DIAGNOSIS — N3281 Overactive bladder: Secondary | ICD-10-CM | POA: Diagnosis not present

## 2015-06-21 LAB — CBC WITH DIFFERENTIAL/PLATELET
Basophils Absolute: 0.1 10*3/uL (ref 0–0.1)
Basophils Relative: 1 %
Eosinophils Absolute: 0.2 10*3/uL (ref 0–0.7)
Eosinophils Relative: 3 %
HEMATOCRIT: 30.2 % — AB (ref 35.0–47.0)
HEMOGLOBIN: 10.3 g/dL — AB (ref 12.0–16.0)
LYMPHS ABS: 1.8 10*3/uL (ref 1.0–3.6)
Lymphocytes Relative: 25 %
MCH: 30.2 pg (ref 26.0–34.0)
MCHC: 34.2 g/dL (ref 32.0–36.0)
MCV: 88.2 fL (ref 80.0–100.0)
MONOS PCT: 11 %
Monocytes Absolute: 0.8 10*3/uL (ref 0.2–0.9)
NEUTROS ABS: 4.2 10*3/uL (ref 1.4–6.5)
NEUTROS PCT: 60 %
Platelets: 198 10*3/uL (ref 150–440)
RBC: 3.43 MIL/uL — ABNORMAL LOW (ref 3.80–5.20)
RDW: 15.2 % — ABNORMAL HIGH (ref 11.5–14.5)
WBC: 7.1 10*3/uL (ref 3.6–11.0)

## 2015-06-21 LAB — COMPREHENSIVE METABOLIC PANEL
ALBUMIN: 3.7 g/dL (ref 3.5–5.0)
ALK PHOS: 71 U/L (ref 38–126)
ALT: 17 U/L (ref 14–54)
ANION GAP: 6 (ref 5–15)
AST: 23 U/L (ref 15–41)
BUN: 15 mg/dL (ref 6–20)
CALCIUM: 8.7 mg/dL — AB (ref 8.9–10.3)
CHLORIDE: 106 mmol/L (ref 101–111)
CO2: 27 mmol/L (ref 22–32)
CREATININE: 1.21 mg/dL — AB (ref 0.44–1.00)
GFR calc Af Amer: 58 mL/min — ABNORMAL LOW (ref >60)
GFR calc non Af Amer: 50 mL/min — ABNORMAL LOW (ref >60)
GLUCOSE: 109 mg/dL — AB (ref 65–99)
Potassium: 3.8 mmol/L (ref 3.5–5.1)
Sodium: 139 mmol/L (ref 135–145)
Total Bilirubin: 1 mg/dL (ref 0.3–1.2)
Total Protein: 6.3 g/dL — ABNORMAL LOW (ref 6.5–8.1)

## 2015-06-21 MED ORDER — HEPARIN SOD (PORK) LOCK FLUSH 100 UNIT/ML IV SOLN
500.0000 [IU] | Freq: Once | INTRAVENOUS | Status: AC
  Administered 2015-06-21: 500 [IU] via INTRAVENOUS

## 2015-06-21 MED ORDER — ACETAMINOPHEN 325 MG PO TABS
650.0000 mg | ORAL_TABLET | Freq: Once | ORAL | Status: AC
  Administered 2015-06-21: 650 mg via ORAL
  Filled 2015-06-21: qty 2

## 2015-06-21 MED ORDER — TRASTUZUMAB CHEMO INJECTION 440 MG
6.0000 mg/kg | Freq: Once | INTRAVENOUS | Status: AC
  Administered 2015-06-21: 651 mg via INTRAVENOUS
  Filled 2015-06-21: qty 31

## 2015-06-21 MED ORDER — DIPHENHYDRAMINE HCL 25 MG PO CAPS
50.0000 mg | ORAL_CAPSULE | Freq: Once | ORAL | Status: AC
  Administered 2015-06-21: 50 mg via ORAL
  Filled 2015-06-21: qty 2

## 2015-06-21 MED ORDER — BENZONATATE 100 MG PO CAPS
100.0000 mg | ORAL_CAPSULE | Freq: Three times a day (TID) | ORAL | Status: DC | PRN
Start: 2015-06-21 — End: 2015-07-12

## 2015-06-21 MED ORDER — SODIUM CHLORIDE 0.9% FLUSH
10.0000 mL | INTRAVENOUS | Status: DC | PRN
  Administered 2015-06-21: 10 mL via INTRAVENOUS
  Filled 2015-06-21: qty 10

## 2015-06-21 MED ORDER — SODIUM CHLORIDE 0.9 % IV SOLN
Freq: Once | INTRAVENOUS | Status: AC
  Administered 2015-06-21: 14:00:00 via INTRAVENOUS
  Filled 2015-06-21: qty 1000

## 2015-06-21 NOTE — Progress Notes (Signed)
Patient is noticing food is starting to taste salty.  Still has nasal drainage that is irritating throat and a causing her to cough.

## 2015-06-21 NOTE — Progress Notes (Signed)
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  No chief complaint on file.   INTERVAL HISTORY:  Patient returns to clinic today for further evaluation and consideration of cycle 13 of Herceptin only. She still reports nasal drainage that is irritating throat and a causing her to cough. She is requesting more tessalon pearles for the cough. She feels like her symptoms are improving. She does not mention headaches today. She did have some diarrhea this morning and is not sure what upset her stomach today. She has no neurologic complaints. She denies any recent fevers. Her appetite is not good but she denies weight loss. She has no chest pain or shortness of breath. She has no urinary complaints.   REVIEW OF SYSTEMS:   Review of Systems  Constitutional: Negative.  Negative for fever and malaise/fatigue.  HENT: Negative for congestion and sore throat.   Eyes: Negative for blurred vision.  Respiratory: Positive for cough.   Cardiovascular: Negative.   Gastrointestinal: Positive for diarrhea. Negative for nausea and abdominal pain.  Genitourinary: Negative.   Musculoskeletal: Negative for back pain.  Neurological: Negative for weakness and headaches.  Psychiatric/Behavioral: Negative.     As per HPI. Otherwise, a complete review of systems is negatve.  PAST MEDICAL HISTORY: Past Medical History  Diagnosis Date  . Major depressive disorder, recurrent ([place])   . HTN (hypertension)   . Overactive bladder   . Migraines   . Gastritis   . Hypothyroidism   . OSA  (obstructive sleep apnea)     untreated due to lack of insurance  . Insomnia   . GERD (gastroesophageal reflux disease)   . Pericarditis 1997  . Detached retina 1997  . Chronic headache   . Morbid obesity ([place])   . Breast cancer ([place])   . Bronchitis 04-2014  . Shortness of breath dyspnea     with exertion only  . Anemia     h/o  . Pericarditis 1997  . Family history of adverse reaction to anesthesia     pts dad has pseudocholinestrace deficieny  . NSTEMI (non-ST elevated myocardial infarction) ([place]) 04/16/14    Due to demand- normal Cath 04/19/14  . NSTEMI (non-ST elevated myocardial infarction) ([place]) 01-24-15  . Last menstrual period (LMP) > 10 days ago     LMP 2014  . Coronary artery disease      small non-ST elevation myocardial infarction in April 2016 in January 2017. Cardiac catheterization in April 2016 showed mild nonobstructive disease affecting the LAD with tortuous vessels. CTA in 2017 showed only 30% disease in the LAD.    PAST SURGICAL HISTORY: Past Surgical History  Procedure Laterality Date  . Back surgery      L4-5  . Knee surgery    . Cesarean section    . Retinal detachment surgery Right 1997  . Foot surgery    . Breast lumpectomy with sentinel lymph node biopsy Left 08/13/2014    Procedure: BREAST LUMPECTOMY WITH SENTINEL LYMPH NODE BIOPSY;  Surgeon: Robert Bellow, MD;  Location: [facility] ORS;  Service: General;  Laterality: Left;  . Breast mammosite Left  08/30/2014    Procedure: MAMMOSITE BREAST;  Surgeon: Robert Bellow, MD;  Location: [facility] ORS;  Service: General;  Laterality: Left;  . Portacath placement Right 08/30/2014    Procedure: INSERTION PORT-A-CATH;  Surgeon: Robert Bellow, MD;  Location: [facility] ORS;  Service: General;  Laterality: Right;  . Coronary angioplasty  April 2016    FAMILY HISTORY Family History  Problem Relation Age of Onset  . Alcohol abuse Mother   . Thyroid disease Mother   . Hypertension Mother   . COPD Mother   . Cancer  Father     liposarcoma  . Congestive Heart Failure Father     V Tach  . Depression Father   . Hypertension Father   . Heart attack Father   . Arrhythmia Father   . Heart failure Father   . Cancer Maternal Uncle     Pancreatic cancer  . Heart disease Paternal Grandfather 23    MI  . Diabetes Other   . Cancer Sister     breast  . Breast cancer Sister 24       ADVANCED DIRECTIVES:    HEALTH MAINTENANCE: Social History  Substance Use Topics  . Smoking status: Never Smoker   . Smokeless tobacco: Never Used  . Alcohol Use: No    Allergies  Allergen Reactions  . Reglan [Metoclopramide] Shortness Of Breath and Other (See Comments)    This medication caused a dystonic reaction.    Marland Kitchen Paxil [Paroxetine Hcl] Other (See Comments)    Reaction:  Blurred vision  . Tegaderm Ag Mesh [Silver] Rash  . Ultram [Tramadol] Itching and Rash    Current Outpatient Prescriptions  Medication Sig Dispense Refill  . acetaminophen (TYLENOL) 500 MG tablet Take 1,000 mg by mouth every 6 (six) hours as needed for mild pain or headache.    Marland Kitchen aspirin EC 81 MG tablet Take 81 mg by mouth daily.    Marland Kitchen CALCIUM PO Take by mouth daily.    . cholecalciferol (VITAMIN D) 1000 UNITS tablet Take 1,000 Units by mouth daily.    . diphenhydrAMINE (BENADRYL) 25 MG tablet Take 25 mg by mouth every 6 (six) hours as needed for allergies.     Marland Kitchen escitalopram (LEXAPRO) 20 MG tablet Take 20 mg by mouth daily.    . hydrALAZINE (APRESOLINE) 25 MG tablet Take 25 mg by mouth 2 (two) times daily.    Marland Kitchen HYDROcodone-acetaminophen (NORCO) 5-325 MG tablet Take 1 tablet by mouth every 6 (six) hours as needed for moderate pain. 15 tablet 0  . levothyroxine (SYNTHROID, LEVOTHROID) 100 MCG tablet Take 1 tablet (100 mcg total) by mouth daily. 30 tablet 11  . lidocaine-prilocaine (EMLA) cream Apply 1 application topically as needed (prior to accessing port).    . metoprolol succinate (TOPROL-XL) 100 MG 24 hr tablet Take 100 mg by mouth  daily. Take with or immediately following a meal.    . Multiple Vitamin (MULTIVITAMIN WITH MINERALS) TABS tablet Take 1 tablet by mouth daily.    . ranitidine (ZANTAC) 300 MG tablet TAKE ONE TABLET BY MOUTH AT BEDTIME* REPLACES OMEPRAZOLE* 30 tablet 2  . simvastatin (ZOCOR) 10 MG tablet Take 10 mg by mouth daily.    . vitamin B-12 (CYANOCOBALAMIN) 1000 MCG tablet Take 1,000 mcg by mouth daily.    . benzonatate (TESSALON PERLES) 100 MG capsule Take 1 capsule (100 mg total) by mouth 3 (three) times daily as needed for cough. (Patient not taking: Reported on 06/21/2015) 20 capsule 0  No current facility-administered medications for this visit.   Facility-Administered Medications Ordered in Other Visits  Medication Dose Route Frequency Provider Last Rate Last Dose  . sodium chloride 0.9 % injection 10 mL  10 mL Intracatheter PRN Lloyd Huger, MD   10 mL at 11/30/14 1205    OBJECTIVE: Filed Vitals:   06/21/15 1344  BP: 133/87  Pulse: 70  Temp: 98.5 F (36.9 C)  Resp: 16     Body mass index is 39.25 kg/(m^2).    ECOG FS:0 - Asymptomatic  General: Well-developed, well-nourished, no acute distress. Eyes: Pink conjunctiva, anicteric sclera. Breasts: Exam deferred today. Lungs: Clear to auscultation bilaterally. Heart: Regular rate and rhythm. No rubs, murmurs, or gallops. Abdomen: Soft, nontender, nondistended. No organomegaly noted, normoactive bowel sounds. Musculoskeletal: No edema, cyanosis, or clubbing. Neuro: Alert, answering all questions appropriately. Cranial nerves grossly intact. Skin: No rashes or petechiae noted. Psych: Normal affect.   LAB RESULTS:  Lab Results  Component Value Date   NA 139 05/31/2015   K 3.9 05/31/2015   CL 106 05/31/2015   CO2 27 05/31/2015   GLUCOSE 112* 05/31/2015   BUN 19 05/31/2015   CREATININE 1.19* 05/31/2015   CALCIUM 8.7* 05/31/2015   PROT 6.6 05/31/2015   ALBUMIN 4.0 05/31/2015   AST 23 05/31/2015   ALT 19 05/31/2015   ALKPHOS  86 05/31/2015   BILITOT 1.0 05/31/2015   GFRNONAA 51* 05/31/2015   GFRAA 59* 05/31/2015    Lab Results  Component Value Date   WBC 7.1 06/21/2015   NEUTROABS 4.2 06/21/2015   HGB 10.3* 06/21/2015   HCT 30.2* 06/21/2015   MCV 88.2 06/21/2015   PLT 198 06/21/2015     STUDIES: No results found.  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  PLAN:    1. Breast cancer: Given the fact that patient is HER-2 positive, she will benefit from adjuvant chemotherapy. Patient completed MammoSite radiation. Patient's MUGA scan from April 29, 2015 was improved to 60.5% from the 53% in January 2017. Repeat MUGA in July 2017.  Given patient's persistent side effects, chemotherapy was discontinued altogether after 3 cycles. Proceed with cycle 13 of 18 of Herceptin only today. Return to clinic in 3 weeks for consideration of cycle 14. She does not require an aromatase inhibitor given the fact that her tumor is ER/PR negative.   2. Anemia: HGB 10.3 today. Monitor 3. Cold symptoms: OTC sudafed. Tessalon Pearls for cough sent to pharmacy.  4. Headaches: MRI April 29, 2015 was normal.  5. Hypocalcemia: Continue calcium/vitamin d twice daily. 6. Creatinine: 1.21 today. Increase oral fluid intake. Monitor  Patient expressed understanding and was in agreement with this plan. She also understands that She can call clinic at any time with any questions, concerns, or complaints.   Breast cancer, female   Staging form: Breast, AJCC 7th Edition     Pathologic stage from 08/17/2014: Stage IA (T1c, N0, cM0) - Signed by Lloyd Huger, MD on 08/17/2014  Dr. Grayland Ormond was available for consultation and review of plan of care for this patient.  Mayra Reel, NP   06/21/2015 1:50 PM

## 2015-06-28 ENCOUNTER — Encounter: Payer: Self-pay | Admitting: *Deleted

## 2015-07-12 ENCOUNTER — Inpatient Hospital Stay (HOSPITAL_BASED_OUTPATIENT_CLINIC_OR_DEPARTMENT_OTHER): Payer: Medicaid Other | Admitting: Oncology

## 2015-07-12 ENCOUNTER — Inpatient Hospital Stay: Payer: Medicaid Other

## 2015-07-12 VITALS — BP 138/88 | HR 61 | Temp 97.4°F | Resp 18 | Wt 239.0 lb

## 2015-07-12 DIAGNOSIS — I2510 Atherosclerotic heart disease of native coronary artery without angina pectoris: Secondary | ICD-10-CM

## 2015-07-12 DIAGNOSIS — Z170 Estrogen receptor positive status [ER+]: Secondary | ICD-10-CM | POA: Diagnosis not present

## 2015-07-12 DIAGNOSIS — K219 Gastro-esophageal reflux disease without esophagitis: Secondary | ICD-10-CM

## 2015-07-12 DIAGNOSIS — G4730 Sleep apnea, unspecified: Secondary | ICD-10-CM

## 2015-07-12 DIAGNOSIS — D649 Anemia, unspecified: Secondary | ICD-10-CM | POA: Diagnosis not present

## 2015-07-12 DIAGNOSIS — Z79899 Other long term (current) drug therapy: Secondary | ICD-10-CM

## 2015-07-12 DIAGNOSIS — R0602 Shortness of breath: Secondary | ICD-10-CM

## 2015-07-12 DIAGNOSIS — C50912 Malignant neoplasm of unspecified site of left female breast: Secondary | ICD-10-CM

## 2015-07-12 DIAGNOSIS — I10 Essential (primary) hypertension: Secondary | ICD-10-CM

## 2015-07-12 DIAGNOSIS — R05 Cough: Secondary | ICD-10-CM

## 2015-07-12 DIAGNOSIS — I252 Old myocardial infarction: Secondary | ICD-10-CM

## 2015-07-12 DIAGNOSIS — R944 Abnormal results of kidney function studies: Secondary | ICD-10-CM

## 2015-07-12 DIAGNOSIS — E669 Obesity, unspecified: Secondary | ICD-10-CM

## 2015-07-12 DIAGNOSIS — F329 Major depressive disorder, single episode, unspecified: Secondary | ICD-10-CM

## 2015-07-12 DIAGNOSIS — Z5112 Encounter for antineoplastic immunotherapy: Secondary | ICD-10-CM | POA: Diagnosis not present

## 2015-07-12 DIAGNOSIS — E039 Hypothyroidism, unspecified: Secondary | ICD-10-CM

## 2015-07-12 DIAGNOSIS — Z8669 Personal history of other diseases of the nervous system and sense organs: Secondary | ICD-10-CM

## 2015-07-12 DIAGNOSIS — R51 Headache: Secondary | ICD-10-CM

## 2015-07-12 DIAGNOSIS — Z7982 Long term (current) use of aspirin: Secondary | ICD-10-CM

## 2015-07-12 DIAGNOSIS — N3281 Overactive bladder: Secondary | ICD-10-CM

## 2015-07-12 LAB — CBC WITH DIFFERENTIAL/PLATELET
BASOS PCT: 1 %
Basophils Absolute: 0.1 10*3/uL (ref 0–0.1)
EOS PCT: 3 %
Eosinophils Absolute: 0.2 10*3/uL (ref 0–0.7)
HEMATOCRIT: 29.7 % — AB (ref 35.0–47.0)
Hemoglobin: 10.3 g/dL — ABNORMAL LOW (ref 12.0–16.0)
Lymphocytes Relative: 32 %
Lymphs Abs: 2.2 10*3/uL (ref 1.0–3.6)
MCH: 30.7 pg (ref 26.0–34.0)
MCHC: 34.7 g/dL (ref 32.0–36.0)
MCV: 88.5 fL (ref 80.0–100.0)
MONO ABS: 0.7 10*3/uL (ref 0.2–0.9)
MONOS PCT: 11 %
NEUTROS ABS: 3.5 10*3/uL (ref 1.4–6.5)
Neutrophils Relative %: 53 %
Platelets: 215 10*3/uL (ref 150–440)
RBC: 3.36 MIL/uL — ABNORMAL LOW (ref 3.80–5.20)
RDW: 15 % — AB (ref 11.5–14.5)
WBC: 6.8 10*3/uL (ref 3.6–11.0)

## 2015-07-12 LAB — COMPREHENSIVE METABOLIC PANEL
ALT: 19 U/L (ref 14–54)
ANION GAP: 9 (ref 5–15)
AST: 24 U/L (ref 15–41)
Albumin: 3.9 g/dL (ref 3.5–5.0)
Alkaline Phosphatase: 74 U/L (ref 38–126)
BILIRUBIN TOTAL: 1 mg/dL (ref 0.3–1.2)
BUN: 20 mg/dL (ref 6–20)
CO2: 26 mmol/L (ref 22–32)
Calcium: 8.9 mg/dL (ref 8.9–10.3)
Chloride: 102 mmol/L (ref 101–111)
Creatinine, Ser: 1.1 mg/dL — ABNORMAL HIGH (ref 0.44–1.00)
GFR calc Af Amer: >60 mL/min (ref >60)
GFR, EST NON AFRICAN AMERICAN: 56 mL/min — AB (ref >60)
Glucose, Bld: 89 mg/dL (ref 65–99)
POTASSIUM: 3.7 mmol/L (ref 3.5–5.1)
Sodium: 137 mmol/L (ref 135–145)
TOTAL PROTEIN: 6.6 g/dL (ref 6.5–8.1)

## 2015-07-12 MED ORDER — ACETAMINOPHEN 325 MG PO TABS
650.0000 mg | ORAL_TABLET | Freq: Once | ORAL | Status: AC
  Administered 2015-07-12: 650 mg via ORAL
  Filled 2015-07-12: qty 2

## 2015-07-12 MED ORDER — HEPARIN SOD (PORK) LOCK FLUSH 100 UNIT/ML IV SOLN
INTRAVENOUS | Status: AC
  Filled 2015-07-12: qty 5

## 2015-07-12 MED ORDER — DIPHENHYDRAMINE HCL 25 MG PO CAPS
50.0000 mg | ORAL_CAPSULE | Freq: Once | ORAL | Status: AC
  Administered 2015-07-12: 50 mg via ORAL
  Filled 2015-07-12: qty 2

## 2015-07-12 MED ORDER — SODIUM CHLORIDE 0.9% FLUSH
10.0000 mL | INTRAVENOUS | Status: DC | PRN
  Administered 2015-07-12: 10 mL via INTRAVENOUS
  Filled 2015-07-12: qty 10

## 2015-07-12 MED ORDER — HEPARIN SOD (PORK) LOCK FLUSH 100 UNIT/ML IV SOLN
500.0000 [IU] | Freq: Once | INTRAVENOUS | Status: AC
  Administered 2015-07-12: 500 [IU] via INTRAVENOUS

## 2015-07-12 MED ORDER — SODIUM CHLORIDE 0.9 % IV SOLN
Freq: Once | INTRAVENOUS | Status: AC
  Administered 2015-07-12: 15:00:00 via INTRAVENOUS
  Filled 2015-07-12: qty 1000

## 2015-07-12 MED ORDER — TRASTUZUMAB CHEMO INJECTION 440 MG
6.0000 mg/kg | Freq: Once | INTRAVENOUS | Status: AC
  Administered 2015-07-12: 651 mg via INTRAVENOUS
  Filled 2015-07-12: qty 31

## 2015-07-12 NOTE — Progress Notes (Signed)
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  Chief Complaint  Patient presents with  . Breast Cancer    INTERVAL HISTORY:  Patient returns to clinic today for further evaluation and consideration of cycle 14 of Herceptin only. She has persistent right shoulder pain, but otherwise feels well. She does not mention headaches today. She has no neurologic complaints. She denies any recent fevers. She has no chest pain or shortness of breath. She denies any nausea, vomiting, constipation, or diarrhea. She has no urinary complaints. Patient offers no further specific complaints today.  REVIEW OF SYSTEMS:   Review of Systems  Constitutional: Negative.  Negative for fever and malaise/fatigue.  HENT: Negative for congestion and sore throat.   Eyes: Negative for blurred vision.  Respiratory: Negative for cough.   Cardiovascular: Negative.  Negative for chest pain.  Gastrointestinal: Negative for nausea, abdominal pain and diarrhea.  Genitourinary: Negative.   Musculoskeletal: Positive for joint pain. Negative for back pain.  Neurological: Negative for weakness and headaches.  Psychiatric/Behavioral: Negative.     As per HPI. Otherwise, a complete review of systems is negatve.  PAST MEDICAL HISTORY: Past Medical History  Diagnosis Date  . Major depressive disorder, recurrent ([place])   . HTN (hypertension)   . Overactive bladder   . Migraines   . Gastritis   . Hypothyroidism   . OSA (obstructive sleep apnea)     untreated due to lack of insurance  . Insomnia     . GERD (gastroesophageal reflux disease)   . Pericarditis 1997  . Detached retina 1997  . Chronic headache   . Morbid obesity ([place])   . Breast cancer ([place])   . Bronchitis 04-2014  . Shortness of breath dyspnea     with exertion only  . Anemia     h/o  . Pericarditis 1997  . Family history of adverse reaction to anesthesia     pts dad has pseudocholinestrace deficieny  . NSTEMI (non-ST elevated myocardial infarction) ([place]) 04/16/14    Due to demand- normal Cath 04/19/14  . NSTEMI (non-ST elevated myocardial infarction) ([place]) 01-24-15  . Last menstrual period (LMP) > 10 days ago     LMP 2014  . Coronary artery disease      small non-ST elevation myocardial infarction in April 2016 in January 2017. Cardiac catheterization in April 2016 showed mild nonobstructive disease affecting the LAD with tortuous vessels. CTA in 2017 showed only 30% disease in the LAD.    PAST SURGICAL HISTORY: Past Surgical History  Procedure Laterality Date  . Back surgery      L4-5  . Knee surgery    . Cesarean section    . Retinal detachment surgery Right 1997  . Foot surgery    . Breast lumpectomy with sentinel lymph node biopsy Left 08/13/2014    Procedure: BREAST LUMPECTOMY WITH SENTINEL LYMPH NODE BIOPSY;  Surgeon: Robert Bellow, MD;  Location: [facility] ORS;  Service: General;  Laterality: Left;  . Breast mammosite Left 08/30/2014    Procedure: MAMMOSITE BREAST;  Surgeon: Robert Bellow, MD;  Location: [facility] ORS;  Service: General;  Laterality: Left;  . Portacath placement Right 08/30/2014    Procedure: INSERTION PORT-A-CATH;  Surgeon: Robert Bellow, MD;  Location: [facility] ORS;  Service: General;  Laterality: Right;  . Coronary angioplasty  April 2016    FAMILY HISTORY Family History  Problem Relation Age of Onset  . Alcohol abuse Mother   . Thyroid disease Mother   . Hypertension Mother   . COPD Mother   . Cancer Father     liposarcoma  . Congestive Heart Failure Father     V Tach  .  Depression Father   . Hypertension Father   . Heart attack Father   . Arrhythmia Father   . Heart failure Father   . Cancer Maternal Uncle     Pancreatic cancer  . Heart disease Paternal Grandfather 61    MI  . Diabetes Other   . Cancer Sister     breast  . Breast cancer Sister 35       ADVANCED DIRECTIVES:    HEALTH MAINTENANCE: Social History  Substance Use Topics  . Smoking status: Never Smoker   . Smokeless tobacco: Never Used  . Alcohol Use: No    Allergies  Allergen Reactions  . Reglan [Metoclopramide] Shortness Of Breath and Other (See Comments)    This medication caused a dystonic reaction.    Marland Kitchen Paxil [Paroxetine Hcl] Other (See Comments)    Reaction:  Blurred vision  . Tegaderm Ag Mesh [Silver] Rash  . Ultram [Tramadol] Itching and Rash    Current Outpatient Prescriptions  Medication Sig Dispense Refill  . acetaminophen (TYLENOL) 500 MG tablet Take 1,000 mg by mouth every 6 (six) hours as needed for mild pain or headache.    Marland Kitchen aspirin EC 81 MG tablet Take 81 mg by mouth daily.    Marland Kitchen CALCIUM PO Take 1 tablet by mouth 2 (two) times daily.     . cholecalciferol (VITAMIN D) 1000 UNITS tablet Take 1,000 Units by mouth daily.    . diphenhydrAMINE (BENADRYL) 25 MG tablet Take 25 mg by mouth every 6 (six) hours as needed for allergies.     Marland Kitchen escitalopram (LEXAPRO) 20 MG tablet Take 20 mg by mouth daily.    . hydrALAZINE (APRESOLINE) 25 MG tablet Take 25 mg by mouth 2 (two) times daily.    Marland Kitchen levothyroxine (SYNTHROID, LEVOTHROID) 100 MCG tablet Take 1 tablet (100 mcg total) by mouth daily. 30 tablet 11  . lidocaine-prilocaine (EMLA) cream Apply 1 application topically as needed (prior to accessing port).    . metoprolol succinate (TOPROL-XL) 100 MG 24 hr tablet Take 100 mg by mouth daily. Take with or immediately following a meal.    . Multiple Vitamin (MULTIVITAMIN WITH MINERALS) TABS tablet Take 1 tablet by mouth daily.    . ranitidine (ZANTAC) 300 MG tablet TAKE ONE  TABLET BY MOUTH AT BEDTIME* REPLACES OMEPRAZOLE* 30 tablet 2  . simvastatin (ZOCOR) 10 MG tablet Take 10 mg by mouth daily.    . vitamin B-12 (CYANOCOBALAMIN) 1000 MCG tablet Take 1,000 mcg by mouth daily.     No current facility-administered medications for this visit.   Facility-Administered Medications Ordered in Other Visits  Medication Dose Route Frequency Provider Last Rate Last Dose  . sodium chloride 0.9 % injection 10 mL  10 mL Intracatheter PRN Lloyd Huger, MD   10 mL at 11/30/14 1205  . sodium chloride flush (NS) 0.9 % injection 10 mL  10 mL Intravenous PRN Lloyd Huger, MD  10 mL at 07/12/15 1329    OBJECTIVE: Filed Vitals:   07/12/15 1358  BP: 138/88  Pulse: 61  Temp: 97.4 F (36.3 C)  Resp: 18     Body mass index is 39.77 kg/(m^2).    ECOG FS:0 - Asymptomatic  General: Well-developed, well-nourished, no acute distress. Eyes: Pink conjunctiva, anicteric sclera. Breasts: Exam deferred today. Lungs: Clear to auscultation bilaterally. Heart: Regular rate and rhythm. No rubs, murmurs, or gallops. Abdomen: Soft, nontender, nondistended. No organomegaly noted, normoactive bowel sounds. Musculoskeletal: No edema, cyanosis, or clubbing. Neuro: Alert, answering all questions appropriately. Cranial nerves grossly intact. Skin: No rashes or petechiae noted. Psych: Normal affect.   LAB RESULTS:  Lab Results  Component Value Date   NA 137 07/12/2015   K 3.7 07/12/2015   CL 102 07/12/2015   CO2 26 07/12/2015   GLUCOSE 89 07/12/2015   BUN 20 07/12/2015   CREATININE 1.10* 07/12/2015   CALCIUM 8.9 07/12/2015   PROT 6.6 07/12/2015   ALBUMIN 3.9 07/12/2015   AST 24 07/12/2015   ALT 19 07/12/2015   ALKPHOS 74 07/12/2015   BILITOT 1.0 07/12/2015   GFRNONAA 56* 07/12/2015   GFRAA >60 07/12/2015    Lab Results  Component Value Date   WBC 6.8 07/12/2015   NEUTROABS 3.5 07/12/2015   HGB 10.3* 07/12/2015   HCT 29.7* 07/12/2015   MCV 88.5 07/12/2015    PLT 215 07/12/2015     STUDIES: No results found.  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  PLAN:    1. Breast cancer: Given the fact that patient is HER-2 positive, she will benefit from adjuvant chemotherapy. Patient completed MammoSite radiation. Patient's MUGA scan from April 29, 2015 was improved to 60.5% from the 53% in January 2017. Repeat MUGA in prior to next infusion.  Given patient's persistent side effects, chemotherapy was discontinued altogether after 3 cycles. Proceed with cycle 14 of 18 of Herceptin only today. Return to clinic in 3 weeks for consideration of cycle 15.  She does not require an aromatase inhibitor given the fact that her tumor is ER/PR negative.   2. Anemia: HGB 10.3 today. Monitor 3. Cold symptoms: Resolved.  4. Headaches: MRI April 29, 2015 was normal.   Patient expressed understanding and was in agreement with this plan. She also understands that She can call clinic at any time with any questions, concerns, or complaints.   Breast cancer, female   Staging form: Breast, AJCC 7th Edition     Pathologic stage from 08/17/2014: Stage IA (T1c, N0, cM0) - Signed by Lloyd Huger, MD on 08/17/2014  Lloyd Huger, MD   07/12/2015 4:10 PM

## 2015-07-12 NOTE — Progress Notes (Signed)
Having right shoulder pain for the past 1 month, no other complaints. Take OTC tylenol for pain relief.

## 2015-07-14 NOTE — Progress Notes (Signed)
  Oncology Nurse Navigator Documentation  Navigator Location: [facility] (07/14/15 1600) Navigator Encounter Type: Telephone (07/14/15 1600) Telephone: Incoming Call;Outgoing Call;Financial Assistance (07/14/15 1600)         Patient Visit Type: Follow-up (07/14/15 1600)   Barriers/Navigation Needs: Financial ([place] renewal) (07/14/15 1600)                  Acuity Level 1: Minimal follow up required (07/14/15 1600)       Time Spent with Patient: > 120 (07/14/15 1600)   Multiple contacts with patient and DSS to ensure [place] renewal is approved.

## 2015-07-18 ENCOUNTER — Encounter
Admission: RE | Admit: 2015-07-18 | Discharge: 2015-07-18 | Disposition: A | Discharge status: Home / Self Care | Payer: Medicaid Other | Source: Ambulatory Visit | Attending: Oncology | Admitting: Oncology

## 2015-07-18 DIAGNOSIS — C50912 Malignant neoplasm of unspecified site of left female breast: Secondary | ICD-10-CM | POA: Insufficient documentation

## 2015-07-18 MED ORDER — TECHNETIUM TC 99M-LABELED RED BLOOD CELLS IV KIT
20.0000 | PACK | Freq: Once | INTRAVENOUS | Status: AC | PRN
  Administered 2015-07-18: 19.565 via INTRAVENOUS

## 2015-07-20 ENCOUNTER — Other Ambulatory Visit: Payer: Self-pay | Admitting: *Deleted

## 2015-07-20 ENCOUNTER — Other Ambulatory Visit: Payer: Self-pay | Admitting: Family Medicine

## 2015-07-20 MED ORDER — METOPROLOL SUCCINATE ER 100 MG PO TB24: 100.0000 mg | ORAL_TABLET | Freq: Every day | ORAL | Status: DC

## 2015-08-02 ENCOUNTER — Inpatient Hospital Stay: Payer: Medicaid Other

## 2015-08-02 ENCOUNTER — Inpatient Hospital Stay: Payer: Medicaid Other | Attending: Oncology | Admitting: Oncology

## 2015-08-02 VITALS — BP 133/88 | HR 61 | Temp 97.1°F | Resp 18 | Wt 239.6 lb

## 2015-08-02 DIAGNOSIS — R0602 Shortness of breath: Secondary | ICD-10-CM | POA: Diagnosis not present

## 2015-08-02 DIAGNOSIS — G4730 Sleep apnea, unspecified: Secondary | ICD-10-CM | POA: Diagnosis not present

## 2015-08-02 DIAGNOSIS — Z800 Family history of malignant neoplasm of digestive organs: Secondary | ICD-10-CM | POA: Diagnosis not present

## 2015-08-02 DIAGNOSIS — R51 Headache: Secondary | ICD-10-CM

## 2015-08-02 DIAGNOSIS — Z170 Estrogen receptor positive status [ER+]: Secondary | ICD-10-CM | POA: Diagnosis not present

## 2015-08-02 DIAGNOSIS — C50512 Malignant neoplasm of lower-outer quadrant of left female breast: Secondary | ICD-10-CM | POA: Insufficient documentation

## 2015-08-02 DIAGNOSIS — E039 Hypothyroidism, unspecified: Secondary | ICD-10-CM | POA: Diagnosis not present

## 2015-08-02 DIAGNOSIS — K219 Gastro-esophageal reflux disease without esophagitis: Secondary | ICD-10-CM | POA: Diagnosis not present

## 2015-08-02 DIAGNOSIS — I252 Old myocardial infarction: Secondary | ICD-10-CM

## 2015-08-02 DIAGNOSIS — F329 Major depressive disorder, single episode, unspecified: Secondary | ICD-10-CM | POA: Diagnosis not present

## 2015-08-02 DIAGNOSIS — Z808 Family history of malignant neoplasm of other organs or systems: Secondary | ICD-10-CM

## 2015-08-02 DIAGNOSIS — I10 Essential (primary) hypertension: Secondary | ICD-10-CM | POA: Insufficient documentation

## 2015-08-02 DIAGNOSIS — M25511 Pain in right shoulder: Secondary | ICD-10-CM | POA: Diagnosis not present

## 2015-08-02 DIAGNOSIS — C50912 Malignant neoplasm of unspecified site of left female breast: Secondary | ICD-10-CM

## 2015-08-02 DIAGNOSIS — I319 Disease of pericardium, unspecified: Secondary | ICD-10-CM

## 2015-08-02 DIAGNOSIS — Z7982 Long term (current) use of aspirin: Secondary | ICD-10-CM | POA: Insufficient documentation

## 2015-08-02 DIAGNOSIS — D649 Anemia, unspecified: Secondary | ICD-10-CM

## 2015-08-02 DIAGNOSIS — Z803 Family history of malignant neoplasm of breast: Secondary | ICD-10-CM

## 2015-08-02 DIAGNOSIS — Z79899 Other long term (current) drug therapy: Secondary | ICD-10-CM | POA: Insufficient documentation

## 2015-08-02 DIAGNOSIS — E669 Obesity, unspecified: Secondary | ICD-10-CM | POA: Insufficient documentation

## 2015-08-02 DIAGNOSIS — I2510 Atherosclerotic heart disease of native coronary artery without angina pectoris: Secondary | ICD-10-CM | POA: Insufficient documentation

## 2015-08-02 DIAGNOSIS — Z5112 Encounter for antineoplastic immunotherapy: Secondary | ICD-10-CM | POA: Insufficient documentation

## 2015-08-02 LAB — CBC WITH DIFFERENTIAL/PLATELET
BASOS ABS: 0.1 10*3/uL (ref 0–0.1)
BASOS PCT: 1 %
EOS ABS: 0.3 10*3/uL (ref 0–0.7)
EOS PCT: 3 %
HCT: 29.7 % — ABNORMAL LOW (ref 35.0–47.0)
Hemoglobin: 10.3 g/dL — ABNORMAL LOW (ref 12.0–16.0)
Lymphocytes Relative: 26 %
Lymphs Abs: 1.9 10*3/uL (ref 1.0–3.6)
MCH: 30.8 pg (ref 26.0–34.0)
MCHC: 34.5 g/dL (ref 32.0–36.0)
MCV: 89.1 fL (ref 80.0–100.0)
MONO ABS: 0.8 10*3/uL (ref 0.2–0.9)
MONOS PCT: 11 %
NEUTROS PCT: 59 %
Neutro Abs: 4.3 10*3/uL (ref 1.4–6.5)
PLATELETS: 202 10*3/uL (ref 150–440)
RBC: 3.34 MIL/uL — AB (ref 3.80–5.20)
RDW: 14.1 % (ref 11.5–14.5)
WBC: 7.4 10*3/uL (ref 3.6–11.0)

## 2015-08-02 LAB — COMPREHENSIVE METABOLIC PANEL
ALBUMIN: 3.9 g/dL (ref 3.5–5.0)
ALT: 17 U/L (ref 14–54)
AST: 20 U/L (ref 15–41)
Alkaline Phosphatase: 82 U/L (ref 38–126)
Anion gap: 5 (ref 5–15)
BUN: 23 mg/dL — ABNORMAL HIGH (ref 6–20)
CHLORIDE: 105 mmol/L (ref 101–111)
CO2: 27 mmol/L (ref 22–32)
CREATININE: 1.07 mg/dL — AB (ref 0.44–1.00)
Calcium: 8.7 mg/dL — ABNORMAL LOW (ref 8.9–10.3)
GFR calc non Af Amer: 58 mL/min — ABNORMAL LOW (ref >60)
GLUCOSE: 105 mg/dL — AB (ref 65–99)
Potassium: 3.7 mmol/L (ref 3.5–5.1)
SODIUM: 137 mmol/L (ref 135–145)
Total Bilirubin: 1.2 mg/dL (ref 0.3–1.2)
Total Protein: 6.7 g/dL (ref 6.5–8.1)

## 2015-08-02 MED ORDER — ACETAMINOPHEN 325 MG PO TABS
650.0000 mg | ORAL_TABLET | Freq: Once | ORAL | Status: AC
  Administered 2015-08-02: 650 mg via ORAL
  Filled 2015-08-02: qty 2

## 2015-08-02 MED ORDER — DIPHENHYDRAMINE HCL 25 MG PO CAPS
50.0000 mg | ORAL_CAPSULE | Freq: Once | ORAL | Status: AC
  Administered 2015-08-02: 50 mg via ORAL
  Filled 2015-08-02: qty 2

## 2015-08-02 MED ORDER — HEPARIN SOD (PORK) LOCK FLUSH 100 UNIT/ML IV SOLN
500.0000 [IU] | Freq: Once | INTRAVENOUS | Status: AC | PRN
  Administered 2015-08-02: 500 [IU]
  Filled 2015-08-02: qty 5

## 2015-08-02 MED ORDER — SODIUM CHLORIDE 0.9 % IV SOLN
Freq: Once | INTRAVENOUS | Status: AC
  Administered 2015-08-02: 15:00:00 via INTRAVENOUS
  Filled 2015-08-02: qty 1000

## 2015-08-02 MED ORDER — TRASTUZUMAB CHEMO 150 MG IV SOLR
6.0000 mg/kg | Freq: Once | INTRAVENOUS | Status: AC
  Administered 2015-08-02: 651 mg via INTRAVENOUS
  Filled 2015-08-02: qty 31

## 2015-08-02 NOTE — Progress Notes (Signed)
States has pain in right shoulder that radiates to right ribcage. Pain is constant and has occasional sharp shooting pains.

## 2015-08-09 NOTE — Progress Notes (Signed)
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 She continues to have persistent right shoulder pain, but otherwise feels well. She has no neurologic complaints. She denies any recent fevers. She has no chest pain or shortness of breath. She denies any nausea, vomiting, constipation, or diarrhea. She has no urinary complaints. Patient offers no specific complaints today.  REVIEW OF SYSTEMS:   Review of Systems  Constitutional: Negative.  Negative for fever and malaise/fatigue.  HENT: Negative for congestion and sore throat.   Eyes: Negative for blurred vision.  Respiratory: Negative for cough.   Cardiovascular: Negative.  Negative for chest pain.  Gastrointestinal: Negative for abdominal pain, diarrhea and nausea.  Genitourinary: Negative.   Musculoskeletal: Positive for joint pain. Negative for back pain.  Neurological: Negative for weakness and headaches.  Psychiatric/Behavioral: Negative.     As per HPI. Otherwise, a complete review of systems is negatve.  PAST MEDICAL HISTORY: Past Medical History:  Diagnosis Date  . Anemia    h/o  . Breast cancer ([place])   . Bronchitis 04-2014  . Chronic headache   . Coronary artery disease     small non-ST elevation myocardial infarction in April 2016 in January 2017. Cardiac catheterization in April 2016 showed mild nonobstructive disease affecting the LAD with tortuous  vessels. CTA in 2017 showed only 30% disease in the LAD.  Marland Kitchen Detached retina 1997  . Family history of adverse reaction to anesthesia    pts dad has pseudocholinestrace deficieny  . Gastritis   . GERD (gastroesophageal reflux disease)   . HTN (hypertension)   . Hypothyroidism   . Insomnia   . Last menstrual period (LMP) > 10 days ago    LMP 2014  . Major depressive disorder, recurrent ([place])   . Migraines   . Morbid obesity ([place])   . NSTEMI (non-ST elevated myocardial infarction) ([place]) 04/16/14   Due to demand- normal Cath 04/19/14  . NSTEMI (non-ST elevated myocardial infarction) ([place]) 01-24-15  . OSA (obstructive sleep apnea)    untreated due to lack of insurance  . Overactive bladder   . Pericarditis 1997  . Pericarditis 1997  . Shortness of breath dyspnea    with exertion only    PAST SURGICAL HISTORY: Past Surgical History:  Procedure Laterality Date  . BACK SURGERY     L4-5  . BREAST LUMPECTOMY WITH SENTINEL LYMPH NODE BIOPSY Left 08/13/2014   Procedure: BREAST LUMPECTOMY WITH SENTINEL LYMPH NODE BIOPSY;  Surgeon: Robert Bellow, MD;  Location: [facility] ORS;  Service: General;  Laterality: Left;  . BREAST MAMMOSITE Left 08/30/2014   Procedure: MAMMOSITE BREAST;  Surgeon: Robert Bellow, MD;  Location: [facility] ORS;  Service: General;  Laterality: Left;  . CESAREAN SECTION    . CORONARY ANGIOPLASTY  April 2016  . FOOT SURGERY    . KNEE SURGERY    . PORTACATH PLACEMENT Right 08/30/2014   Procedure: INSERTION PORT-A-CATH;  Surgeon: Robert Bellow, MD;  Location: [facility] ORS;  Service: General;  Laterality: Right;  . RETINAL DETACHMENT SURGERY Right 1997    FAMILY HISTORY Family History  Problem Relation Age of Onset  . Alcohol abuse Mother   . Thyroid disease Mother   . Hypertension Mother   . COPD Mother   . Cancer Father     liposarcoma  . Congestive Heart Failure Father     V Tach  . Depression Father   . Hypertension Father   . Heart attack Father   . Arrhythmia  Father   . Heart failure Father   . Cancer Maternal Uncle     Pancreatic cancer  . Heart disease Paternal Grandfather 42    MI  . Diabetes Other   . Cancer Sister     breast  . Breast cancer Sister 43       ADVANCED DIRECTIVES:    HEALTH MAINTENANCE: Social History  Substance Use Topics  . Smoking status: Never Smoker  . Smokeless tobacco: Never Used  . Alcohol use No    Allergies  Allergen Reactions  . Reglan [Metoclopramide] Shortness Of Breath and Other (See Comments)    This medication caused a dystonic reaction.    Marland Kitchen Paxil [Paroxetine Hcl] Other (See Comments)    Reaction:  Blurred vision  . Tegaderm Ag Mesh [Silver] Rash  . Ultram [Tramadol] Itching and Rash    Current Outpatient Prescriptions  Medication Sig Dispense Refill  . acetaminophen (TYLENOL) 500 MG tablet Take 1,000 mg by mouth every 6 (six) hours as needed for mild pain or headache.    Marland Kitchen aspirin EC 81 MG tablet Take 81 mg by mouth daily.    Marland Kitchen CALCIUM PO Take 1 tablet by mouth 2 (two) times daily.     . cholecalciferol (VITAMIN D) 1000 UNITS tablet Take 1,000 Units by mouth daily.    . diphenhydrAMINE (BENADRYL) 25 MG tablet Take 25 mg by mouth every 6 (six) hours as needed for allergies.     Marland Kitchen escitalopram (LEXAPRO) 20 MG tablet TAKE ONE TABLET BY MOUTH ONCE DAILY 30 tablet 5  . hydrALAZINE (APRESOLINE) 25 MG tablet Take 25 mg by mouth 2 (two) times daily.    Marland Kitchen levothyroxine (SYNTHROID, LEVOTHROID) 100 MCG tablet Take 1 tablet (100 mcg total) by mouth daily. 30 tablet 11  . lidocaine-prilocaine (EMLA) cream Apply 1 application topically as needed (prior to accessing port).    . metoprolol succinate (TOPROL-XL) 100 MG 24 hr tablet Take 1 tablet (100 mg total) by mouth daily. Take with or immediately following a meal. 90 tablet 2  . Multiple Vitamin (MULTIVITAMIN WITH MINERALS) TABS tablet Take 1 tablet by mouth daily.    . ranitidine (ZANTAC) 300 MG tablet TAKE ONE TABLET BY MOUTH AT BEDTIME (REPLACES   OMEPRAZOLE) 30 tablet 5  . simvastatin (ZOCOR) 10 MG tablet Take 10 mg by mouth daily.    . vitamin B-12 (CYANOCOBALAMIN) 1000 MCG tablet Take 1,000 mcg by mouth daily.     No current facility-administered medications for this visit.    Facility-Administered Medications Ordered in Other Visits  Medication Dose Route Frequency Provider Last Rate Last Dose  . sodium chloride 0.9 % injection 10 mL  10 mL Intracatheter PRN Lloyd Huger, MD   10 mL at 11/30/14 1205    OBJECTIVE: Vitals:   08/02/15 1355  BP: 133/88  Pulse: 61  Resp: 18  Temp: 97.1 F (36.2 C)     Body mass index is 39.88 kg/m.    ECOG FS:0 -  Asymptomatic  General: Well-developed, well-nourished, no acute distress. Eyes: Pink conjunctiva, anicteric sclera. Breasts: Exam deferred today. Lungs: Clear to auscultation bilaterally. Heart: Regular rate and rhythm. No rubs, murmurs, or gallops. Abdomen: Soft, nontender, nondistended. No organomegaly noted, normoactive bowel sounds. Musculoskeletal: No edema, cyanosis, or clubbing. Neuro: Alert, answering all questions appropriately. Cranial nerves grossly intact. Skin: No rashes or petechiae noted. Psych: Normal affect.   LAB RESULTS:  Lab Results  Component Value Date   NA 137 08/02/2015   K 3.7 08/02/2015   CL 105 08/02/2015   CO2 27 08/02/2015   GLUCOSE 105 (H) 08/02/2015   BUN 23 (H) 08/02/2015   CREATININE 1.07 (H) 08/02/2015   CALCIUM 8.7 (L) 08/02/2015   PROT 6.7 08/02/2015   ALBUMIN 3.9 08/02/2015   AST 20 08/02/2015   ALT 17 08/02/2015   ALKPHOS 82 08/02/2015   BILITOT 1.2 08/02/2015   GFRNONAA 58 (L) 08/02/2015   GFRAA >60 08/02/2015    Lab Results  Component Value Date   WBC 7.4 08/02/2015   NEUTROABS 4.3 08/02/2015   HGB 10.3 (L) 08/02/2015   HCT 29.7 (L) 08/02/2015   MCV 89.1 08/02/2015   PLT 202 08/02/2015     STUDIES: Nm Cardiac Muga Rest  Result Date: 07/18/2015 CLINICAL DATA:  Breast cancer. Evaluate cardiac function in  relation to chemotherapy. EXAM: NUCLEAR MEDICINE CARDIAC BLOOD POOL IMAGING (MUGA) TECHNIQUE: Cardiac multi-gated acquisition was performed at rest following intravenous injection of Tc-87mlabeled red blood cells. RADIOPHARMACEUTICALS:  19.6 mCi Tc-972mDP in-vitro labeled red blood cells IV COMPARISON:  04/29/2015 FINDINGS: No  focal wall motion abnormality of the left ventricle. Calculated left ventricular ejection fraction equals 56%. Previous ejection fraction equal 61% on 04/29/2015 and 53% on 02/11/2015. IMPRESSION: Left ventricular ejection fraction equals 56 %. Electronically Signed   By: StSuzy Bouchard.D.   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  PLAN:    1. 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 Patient's MUGA scan from July 18, 2015 reported an EF of 53%.  Given patient's persistent side effects, chemotherapy was discontinued altogether after 3 cycles. Proceed with cycle 15 of 18 of Herceptin only today. Return to clinic in 3 weeks for consideration of cycle 16.  She does not require an aromatase inhibitor given the fact that her tumor is ER/PR negative.   2. Anemia: Hemoglobin is stable at 10.3 today. Monitor 3. Headaches: MRI April 29, 2015 was normal.  4. Right shoulder pain: Likely musculoskeletal in nature. We will consider imaging at a later date if patient is interested.  Patient expressed understanding and was in agreement with this plan. She also understands that She can call clinic at any time with any questions, concerns, or complaints.   Breast cancer, female   Staging form: Breast, AJCC 7th Edition     Pathologic stage from 08/17/2014: Stage IA (T1c, N0, cM0) - Signed by TiLloyd HugerMD on 08/17/2014  TiLloyd HugerMD   08/09/2015 10:18 PM

## 2015-08-16 ENCOUNTER — Ambulatory Visit
Admission: RE | Admit: 2015-08-16 | Discharge: 2015-08-16 | Disposition: A | Discharge status: Home / Self Care | Payer: Medicaid Other | Source: Ambulatory Visit | Attending: General Surgery | Admitting: General Surgery

## 2015-08-16 ENCOUNTER — Other Ambulatory Visit: Payer: Self-pay | Admitting: General Surgery

## 2015-08-16 DIAGNOSIS — C50912 Malignant neoplasm of unspecified site of left female breast: Secondary | ICD-10-CM | POA: Diagnosis not present

## 2015-08-22 ENCOUNTER — Other Ambulatory Visit: Payer: Self-pay

## 2015-08-22 DIAGNOSIS — E039 Hypothyroidism, unspecified: Secondary | ICD-10-CM

## 2015-08-23 ENCOUNTER — Inpatient Hospital Stay: Payer: Medicaid Other

## 2015-08-23 ENCOUNTER — Other Ambulatory Visit: Payer: Self-pay | Admitting: *Deleted

## 2015-08-23 ENCOUNTER — Inpatient Hospital Stay: Payer: Medicaid Other | Attending: Internal Medicine

## 2015-08-23 VITALS — BP 129/86 | HR 65 | Temp 96.0°F | Resp 18

## 2015-08-23 DIAGNOSIS — M2550 Pain in unspecified joint: Secondary | ICD-10-CM | POA: Insufficient documentation

## 2015-08-23 DIAGNOSIS — Z809 Family history of malignant neoplasm, unspecified: Secondary | ICD-10-CM | POA: Insufficient documentation

## 2015-08-23 DIAGNOSIS — K219 Gastro-esophageal reflux disease without esophagitis: Secondary | ICD-10-CM | POA: Diagnosis not present

## 2015-08-23 DIAGNOSIS — C50512 Malignant neoplasm of lower-outer quadrant of left female breast: Secondary | ICD-10-CM | POA: Diagnosis not present

## 2015-08-23 DIAGNOSIS — Z170 Estrogen receptor positive status [ER+]: Secondary | ICD-10-CM | POA: Diagnosis not present

## 2015-08-23 DIAGNOSIS — R0602 Shortness of breath: Secondary | ICD-10-CM | POA: Diagnosis not present

## 2015-08-23 DIAGNOSIS — Z7982 Long term (current) use of aspirin: Secondary | ICD-10-CM | POA: Diagnosis not present

## 2015-08-23 DIAGNOSIS — E669 Obesity, unspecified: Secondary | ICD-10-CM | POA: Diagnosis not present

## 2015-08-23 DIAGNOSIS — Z5112 Encounter for antineoplastic immunotherapy: Secondary | ICD-10-CM | POA: Diagnosis present

## 2015-08-23 DIAGNOSIS — N3281 Overactive bladder: Secondary | ICD-10-CM | POA: Diagnosis not present

## 2015-08-23 DIAGNOSIS — Z803 Family history of malignant neoplasm of breast: Secondary | ICD-10-CM | POA: Diagnosis not present

## 2015-08-23 DIAGNOSIS — G4700 Insomnia, unspecified: Secondary | ICD-10-CM | POA: Insufficient documentation

## 2015-08-23 DIAGNOSIS — E039 Hypothyroidism, unspecified: Secondary | ICD-10-CM | POA: Insufficient documentation

## 2015-08-23 DIAGNOSIS — R110 Nausea: Secondary | ICD-10-CM | POA: Diagnosis not present

## 2015-08-23 DIAGNOSIS — I319 Disease of pericardium, unspecified: Secondary | ICD-10-CM | POA: Insufficient documentation

## 2015-08-23 DIAGNOSIS — M25511 Pain in right shoulder: Secondary | ICD-10-CM | POA: Diagnosis not present

## 2015-08-23 DIAGNOSIS — F329 Major depressive disorder, single episode, unspecified: Secondary | ICD-10-CM | POA: Insufficient documentation

## 2015-08-23 DIAGNOSIS — Z79899 Other long term (current) drug therapy: Secondary | ICD-10-CM | POA: Diagnosis not present

## 2015-08-23 DIAGNOSIS — I2510 Atherosclerotic heart disease of native coronary artery without angina pectoris: Secondary | ICD-10-CM | POA: Diagnosis not present

## 2015-08-23 DIAGNOSIS — R51 Headache: Secondary | ICD-10-CM | POA: Insufficient documentation

## 2015-08-23 DIAGNOSIS — I10 Essential (primary) hypertension: Secondary | ICD-10-CM | POA: Diagnosis not present

## 2015-08-23 DIAGNOSIS — D649 Anemia, unspecified: Secondary | ICD-10-CM | POA: Diagnosis not present

## 2015-08-23 DIAGNOSIS — I252 Old myocardial infarction: Secondary | ICD-10-CM | POA: Diagnosis not present

## 2015-08-23 DIAGNOSIS — C50912 Malignant neoplasm of unspecified site of left female breast: Secondary | ICD-10-CM

## 2015-08-23 DIAGNOSIS — Z8669 Personal history of other diseases of the nervous system and sense organs: Secondary | ICD-10-CM | POA: Insufficient documentation

## 2015-08-23 LAB — COMPREHENSIVE METABOLIC PANEL
ALK PHOS: 90 U/L (ref 38–126)
ALT: 17 U/L (ref 14–54)
AST: 21 U/L (ref 15–41)
Albumin: 3.9 g/dL (ref 3.5–5.0)
Anion gap: 6 (ref 5–15)
BUN: 20 mg/dL (ref 6–20)
CALCIUM: 8.7 mg/dL — AB (ref 8.9–10.3)
CHLORIDE: 107 mmol/L (ref 101–111)
CO2: 25 mmol/L (ref 22–32)
CREATININE: 0.98 mg/dL (ref 0.44–1.00)
Glucose, Bld: 100 mg/dL — ABNORMAL HIGH (ref 65–99)
Potassium: 3.7 mmol/L (ref 3.5–5.1)
Sodium: 138 mmol/L (ref 135–145)
TOTAL PROTEIN: 6.8 g/dL (ref 6.5–8.1)
Total Bilirubin: 0.8 mg/dL (ref 0.3–1.2)

## 2015-08-23 LAB — CBC WITH DIFFERENTIAL/PLATELET
Basophils Absolute: 0.1 10*3/uL (ref 0–0.1)
Basophils Relative: 1 %
EOS PCT: 3 %
Eosinophils Absolute: 0.3 10*3/uL (ref 0–0.7)
HCT: 30.3 % — ABNORMAL LOW (ref 35.0–47.0)
Hemoglobin: 10.4 g/dL — ABNORMAL LOW (ref 12.0–16.0)
LYMPHS ABS: 1.8 10*3/uL (ref 1.0–3.6)
LYMPHS PCT: 23 %
MCH: 30.5 pg (ref 26.0–34.0)
MCHC: 34.4 g/dL (ref 32.0–36.0)
MCV: 88.7 fL (ref 80.0–100.0)
MONO ABS: 0.8 10*3/uL (ref 0.2–0.9)
MONOS PCT: 11 %
NEUTROS ABS: 4.8 10*3/uL (ref 1.4–6.5)
Neutrophils Relative %: 62 %
PLATELETS: 221 10*3/uL (ref 150–440)
RBC: 3.41 MIL/uL — ABNORMAL LOW (ref 3.80–5.20)
RDW: 13.9 % (ref 11.5–14.5)
WBC: 7.7 10*3/uL (ref 3.6–11.0)

## 2015-08-23 MED ORDER — LEVOTHYROXINE SODIUM 100 MCG PO TABS: 100.0000 ug | ORAL_TABLET | Freq: Every day | ORAL | 0 refills | Status: DC

## 2015-08-23 MED ORDER — HEPARIN SOD (PORK) LOCK FLUSH 100 UNIT/ML IV SOLN
500.0000 [IU] | Freq: Once | INTRAVENOUS | Status: AC | PRN
  Administered 2015-08-23: 500 [IU]
  Filled 2015-08-23: qty 5

## 2015-08-23 MED ORDER — SODIUM CHLORIDE 0.9 % IV SOLN
6.0000 mg/kg | Freq: Once | INTRAVENOUS | Status: AC
  Administered 2015-08-23: 651 mg via INTRAVENOUS
  Filled 2015-08-23: qty 31

## 2015-08-23 MED ORDER — DIPHENHYDRAMINE HCL 25 MG PO CAPS
50.0000 mg | ORAL_CAPSULE | Freq: Once | ORAL | Status: AC
Start: 2015-08-23 — End: 2015-08-23
  Administered 2015-08-23: 50 mg via ORAL
  Filled 2015-08-23: qty 2

## 2015-08-23 MED ORDER — ACETAMINOPHEN 325 MG PO TABS
650.0000 mg | ORAL_TABLET | Freq: Once | ORAL | Status: AC
  Administered 2015-08-23: 650 mg via ORAL
  Filled 2015-08-23: qty 2

## 2015-08-23 MED ORDER — SODIUM CHLORIDE 0.9 % IV SOLN
Freq: Once | INTRAVENOUS | Status: AC
  Administered 2015-08-23: 14:00:00 via INTRAVENOUS
  Filled 2015-08-23: qty 1000

## 2015-08-23 NOTE — Telephone Encounter (Signed)
Left voice mail

## 2015-08-23 NOTE — Assessment & Plan Note (Signed)
Due for TSH

## 2015-08-23 NOTE — Telephone Encounter (Signed)
Rx approved, but she's due for TSH Please call patient, ask her to have TSH drawn in the next 30 days; thank you

## 2015-08-24 ENCOUNTER — Other Ambulatory Visit: Payer: Medicaid Other

## 2015-08-24 ENCOUNTER — Ambulatory Visit: Payer: Medicaid Other

## 2015-08-24 ENCOUNTER — Ambulatory Visit: Payer: Medicaid Other | Admitting: Hematology and Oncology

## 2015-08-29 ENCOUNTER — Other Ambulatory Visit: Payer: Self-pay

## 2015-08-29 DIAGNOSIS — E039 Hypothyroidism, unspecified: Secondary | ICD-10-CM

## 2015-08-30 ENCOUNTER — Encounter: Payer: Self-pay | Admitting: General Surgery

## 2015-08-30 ENCOUNTER — Ambulatory Visit (INDEPENDENT_AMBULATORY_CARE_PROVIDER_SITE_OTHER): Payer: Medicaid Other | Admitting: General Surgery

## 2015-08-30 ENCOUNTER — Other Ambulatory Visit: Payer: Self-pay | Admitting: Family Medicine

## 2015-08-30 ENCOUNTER — Other Ambulatory Visit: Payer: Medicaid Other

## 2015-08-30 VITALS — BP 132/74 | HR 74 | Resp 14 | Ht 66.0 in | Wt 243.0 lb

## 2015-08-30 DIAGNOSIS — C50912 Malignant neoplasm of unspecified site of left female breast: Secondary | ICD-10-CM | POA: Diagnosis not present

## 2015-08-30 LAB — TSH: TSH: 0.89 m[IU]/L

## 2015-08-30 MED ORDER — LEVOTHYROXINE SODIUM 100 MCG PO TABS: 100.0000 ug | ORAL_TABLET | Freq: Every day | ORAL | 6 refills | Status: DC

## 2015-08-30 NOTE — Progress Notes (Signed)
Patient ID: Robin Howard, female   DOB: [birth year], 55 y.o.   MRN: [id number]  Chief Complaint  Patient presents with  . Follow-up    mammogram    HPI Robin Howard is a 55 y.o. female who presents for a breast evaluation. The most recent breast mammogram and ultrasoundwas done on 08/16/15. Patient has two more treatments left.  Patient does perform regular self breast checks and gets regular mammograms done.    HPI  Past Medical History:  Diagnosis Date  . Anemia    h/o  . Breast cancer ([place]) 07/2014   invasive mammary carcinoma with mammosite and chemo tx  . Bronchitis 04-2014  . Chronic headache   . Coronary artery disease     small non-ST elevation myocardial infarction in April 2016 in January 2017. Cardiac catheterization in April 2016 showed mild nonobstructive disease affecting the LAD with tortuous vessels. CTA in 2017 showed only 30% disease in the LAD.  Marland Kitchen Detached retina 1997  . Family history of adverse reaction to anesthesia    pts dad has pseudocholinestrace deficieny  . Gastritis   . GERD (gastroesophageal reflux disease)   . HTN (hypertension)   . Hypothyroidism   . Insomnia   . Last menstrual period (LMP) > 10 days ago    LMP 2014  . Major depressive disorder, recurrent ([place])   . Migraines   . Morbid obesity ([place])   . NSTEMI (non-ST elevated myocardial infarction) ([place]) 04/16/14   Due to demand- normal Cath 04/19/14  . NSTEMI (non-ST elevated myocardial infarction) ([place]) 01-24-15  . OSA (obstructive sleep apnea)    untreated due to lack of insurance  . Overactive bladder   . Pericarditis 1997  . Pericarditis 1997  . Shortness of breath dyspnea    with exertion only    Past Surgical History:  Procedure Laterality Date  . BACK SURGERY     L4-5  . BREAST BIOPSY Left 07/2014   [facility]  . BREAST CYST ASPIRATION Left 12/13/2014   3:00 position 2 cmfn, fat necrosis per Dr. Dwyane Luo notes  . BREAST LUMPECTOMY WITH SENTINEL LYMPH NODE BIOPSY Left 08/13/2014    Procedure: BREAST LUMPECTOMY WITH SENTINEL LYMPH NODE BIOPSY;  Surgeon: Robert Bellow, MD;  Location: [facility] ORS;  Service: General;  Laterality: Left;  . BREAST MAMMOSITE Left 08/30/2014   Procedure: MAMMOSITE BREAST;  Surgeon: Robert Bellow, MD;  Location: [facility] ORS;  Service: General;  Laterality: Left;  . CESAREAN SECTION    . CORONARY ANGIOPLASTY  April 2016  . FOOT SURGERY    . KNEE SURGERY    . PORTACATH PLACEMENT Right 08/30/2014   Procedure: INSERTION PORT-A-CATH;  Surgeon: Robert Bellow, MD;  Location: [facility] ORS;  Service: General;  Laterality: Right;  . RETINAL DETACHMENT SURGERY Right 1997    Family History  Problem Relation Age of Onset  . Alcohol abuse Mother   . Thyroid disease Mother   . Hypertension Mother   . COPD Mother   . Cancer Father     liposarcoma  . Congestive Heart Failure Father     V Tach  . Depression Father   . Hypertension Father   . Heart attack Father   . Arrhythmia Father   . Heart failure Father   . Cancer Maternal Uncle     Pancreatic cancer  . Heart disease Paternal Grandfather 50    MI  . Diabetes Other   . Cancer Sister     breast  .  Breast cancer Sister 35    Social History Social History  Substance Use Topics  . Smoking status: Never Smoker  . Smokeless tobacco: Never Used  . Alcohol use No    Allergies  Allergen Reactions  . Reglan [Metoclopramide] Shortness Of Breath and Other (See Comments)    This medication caused a dystonic reaction.    Marland Kitchen Paxil [Paroxetine Hcl] Other (See Comments)    Reaction:  Blurred vision  . Tegaderm Ag Mesh [Silver] Rash  . Ultram [Tramadol] Itching and Rash    Current Outpatient Prescriptions  Medication Sig Dispense Refill  . acetaminophen (TYLENOL) 500 MG tablet Take 1,000 mg by mouth every 6 (six) hours as needed for mild pain or headache.    Marland Kitchen aspirin EC 81 MG tablet Take 81 mg by mouth daily.    Marland Kitchen CALCIUM PO Take 1 tablet by mouth 2 (two) times daily.     . cholecalciferol  (VITAMIN D) 1000 UNITS tablet Take 1,000 Units by mouth daily.    . diphenhydrAMINE (BENADRYL) 25 MG tablet Take 25 mg by mouth every 6 (six) hours as needed for allergies.     Marland Kitchen escitalopram (LEXAPRO) 20 MG tablet TAKE ONE TABLET BY MOUTH ONCE DAILY 30 tablet 5  . hydrALAZINE (APRESOLINE) 25 MG tablet Take 25 mg by mouth 2 (two) times daily.    Marland Kitchen lidocaine-prilocaine (EMLA) cream Apply 1 application topically as needed (prior to accessing port).    . metoprolol succinate (TOPROL-XL) 100 MG 24 hr tablet Take 1 tablet (100 mg total) by mouth daily. Take with or immediately following a meal. 90 tablet 2  . Multiple Vitamin (MULTIVITAMIN WITH MINERALS) TABS tablet Take 1 tablet by mouth daily.    . ranitidine (ZANTAC) 300 MG tablet TAKE ONE TABLET BY MOUTH AT BEDTIME (REPLACES  OMEPRAZOLE) 30 tablet 5  . simvastatin (ZOCOR) 10 MG tablet Take 10 mg by mouth daily.    . vitamin A 10000 UNIT capsule Take 10,000 Units by mouth daily.    . vitamin B-12 (CYANOCOBALAMIN) 1000 MCG tablet Take 1,000 mcg by mouth daily.    Marland Kitchen levothyroxine (SYNTHROID, LEVOTHROID) 100 MCG tablet Take 1 tablet (100 mcg total) by mouth daily. 30 tablet 6   No current facility-administered medications for this visit.    Facility-Administered Medications Ordered in Other Visits  Medication Dose Route Frequency Provider Last Rate Last Dose  . sodium chloride 0.9 % injection 10 mL  10 mL Intracatheter PRN Lloyd Huger, MD   10 mL at 11/30/14 1205    Review of Systems Review of Systems  Constitutional: Negative.   Respiratory: Negative.   Cardiovascular: Negative.     Blood pressure 132/74, pulse 74, resp. rate 14, height 5\' 6"  (1.676 m), weight 243 lb (110.2 kg), last menstrual period 08/26/2012.  Physical Exam Physical Exam  Constitutional: She is oriented to person, place, and time. She appears well-developed and well-nourished.  Eyes: Conjunctivae are normal. No scleral icterus.  Neck: Neck supple.   Cardiovascular: Normal rate, regular rhythm and normal heart sounds.   Pulmonary/Chest: Effort normal and breath sounds normal. Right breast exhibits no inverted nipple, no mass, no nipple discharge, no skin change and no tenderness. Left breast exhibits no inverted nipple, no mass, no nipple discharge, no skin change and no tenderness.    Left breast well incision at 5 o 'clock 1 cm thickening at 2 o'clock at the edge of the areolar.   Abdominal: Soft. Bowel sounds are normal.  Lymphadenopathy:  She has no cervical adenopathy.    She has no axillary adenopathy.  Neurological: She is alert and oriented to person, place, and time.  Skin: Skin is warm and dry.    Data Reviewed Bilateral diagnostic mammograms dated 08/16/2015 were reviewed. BI-RADS-2.    Assessment    Doing well status post wide excision.      Plan          The patient has been asked to return in six months.  Robert Bellow 08/31/2015, 8:13 PM

## 2015-08-30 NOTE — Patient Instructions (Addendum)
The patient has been asked to return to the office in six months.

## 2015-08-30 NOTE — Progress Notes (Signed)
Normal TSH; refills sent

## 2015-09-12 NOTE — Progress Notes (Signed)
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 She has noticed some mild nausea over the past several days, but otherwise feels well. She also had a recent retinal tear that was treated with "injections". She has no neurologic complaints. She denies any recent fevers. She has no chest pain or shortness of breath. She denies any nausea, vomiting, constipation, or diarrhea. She has no urinary complaints. Patient offers no specific complaints today.  REVIEW OF SYSTEMS:   Review of Systems  Constitutional: Negative.  Negative for fever and malaise/fatigue.  HENT: Negative for congestion and sore throat.   Eyes: Positive for pain. Negative for blurred vision.  Respiratory: Negative for cough.   Cardiovascular: Negative.  Negative for chest pain.  Gastrointestinal: Positive for nausea. Negative for abdominal pain and diarrhea.  Genitourinary: Negative.   Musculoskeletal: Positive for joint pain. Negative for back pain.  Neurological: Negative for weakness and headaches.  Psychiatric/Behavioral: Negative.     As per HPI. Otherwise, a complete review of systems is negatve.  PAST MEDICAL HISTORY: Past Medical History:  Diagnosis Date  . Anemia    h/o  . Breast cancer ([place]) 07/2014   invasive mammary carcinoma with mammosite and chemo tx  . Bronchitis 04-2014  . Chronic headache   . Coronary artery disease     small  non-ST elevation myocardial infarction in April 2016 in January 2017. Cardiac catheterization in April 2016 showed mild nonobstructive disease affecting the LAD with tortuous vessels. CTA in 2017 showed only 30% disease in the LAD.  Marland Kitchen Detached retina 1997  . Family history of adverse reaction to anesthesia    pts dad has pseudocholinestrace deficieny  . Gastritis   . GERD (gastroesophageal reflux disease)   . HTN (hypertension)   . Hypothyroidism   . Insomnia   . Last menstrual period (LMP) > 10 days ago    LMP 2014  . Major depressive disorder, recurrent ([place])   . Migraines   . Morbid obesity ([place])   . NSTEMI (non-ST elevated myocardial infarction) ([place]) 04/16/14   Due to demand- normal Cath 04/19/14  . NSTEMI (non-ST elevated myocardial infarction) ([place]) 01-24-15  . OSA (obstructive sleep apnea)    untreated due to lack of insurance  . Overactive bladder   . Pericarditis 1997  . Pericarditis 1997  . Shortness of breath dyspnea    with exertion only    PAST SURGICAL HISTORY: Past Surgical History:  Procedure Laterality Date  . BACK SURGERY     L4-5  . BREAST BIOPSY Left 07/2014   [facility]  . BREAST CYST ASPIRATION Left 12/13/2014   3:00 position 2 cmfn, fat necrosis per Dr. Dwyane Luo notes  . BREAST LUMPECTOMY WITH SENTINEL LYMPH NODE BIOPSY Left 08/13/2014   Procedure: BREAST LUMPECTOMY WITH SENTINEL LYMPH NODE BIOPSY;  Surgeon: Robert Bellow, MD;  Location: [facility] ORS;  Service: General;  Laterality: Left;  . BREAST MAMMOSITE Left 08/30/2014   Procedure: MAMMOSITE BREAST;  Surgeon: Robert Bellow,  MD;  Location: [facility] ORS;  Service: General;  Laterality: Left;  . CESAREAN SECTION    . CORONARY ANGIOPLASTY  April 2016  . FOOT SURGERY    . KNEE SURGERY    . PORTACATH PLACEMENT Right 08/30/2014   Procedure: INSERTION PORT-A-CATH;  Surgeon: Robert Bellow, MD;  Location: [facility] ORS;  Service: General;  Laterality: Right;  . RETINAL DETACHMENT SURGERY Right 1997    FAMILY  HISTORY Family History  Problem Relation Age of Onset  . Alcohol abuse Mother   . Thyroid disease Mother   . Hypertension Mother   . COPD Mother   . Cancer Father     liposarcoma  . Congestive Heart Failure Father     V Tach  . Depression Father   . Hypertension Father   . Heart attack Father   . Arrhythmia Father   . Heart failure Father   . Cancer Maternal Uncle     Pancreatic cancer  . Heart disease Paternal Grandfather 56    MI  . Diabetes Other   . Cancer Sister     breast  . Breast cancer Sister 83       ADVANCED DIRECTIVES:    HEALTH MAINTENANCE: Social History  Substance Use Topics  . Smoking status: Never Smoker  . Smokeless tobacco: Never Used  . Alcohol use No    Allergies  Allergen Reactions  . Reglan [Metoclopramide] Shortness Of Breath and Other (See Comments)    This medication caused a dystonic reaction.    Marland Kitchen Paxil [Paroxetine Hcl] Other (See Comments)    Reaction:  Blurred vision  . Tegaderm Ag Mesh [Silver] Rash  . Ultram [Tramadol] Itching and Rash    Current Outpatient Prescriptions  Medication Sig Dispense Refill  . acetaminophen (TYLENOL) 500 MG tablet Take 1,000 mg by mouth every 6 (six) hours as needed for mild pain or headache.    Marland Kitchen aspirin EC 81 MG tablet Take 81 mg by mouth daily.    Marland Kitchen CALCIUM PO Take 1 tablet by mouth 2 (two) times daily.     . cholecalciferol (VITAMIN D) 1000 UNITS tablet Take 1,000 Units by mouth daily.    . diphenhydrAMINE (BENADRYL) 25 MG tablet Take 25 mg by mouth every 6 (six) hours as needed for allergies.     Marland Kitchen escitalopram (LEXAPRO) 20 MG tablet TAKE ONE TABLET BY MOUTH ONCE DAILY 30 tablet 5  . hydrALAZINE (APRESOLINE) 25 MG tablet Take 25 mg by mouth 2 (two) times daily.    Marland Kitchen levothyroxine (SYNTHROID, LEVOTHROID) 100 MCG tablet Take 1 tablet (100 mcg total) by mouth daily. 30 tablet 6  . lidocaine-prilocaine (EMLA) cream Apply 1 application topically as needed (prior to accessing port).    . metoprolol  succinate (TOPROL-XL) 100 MG 24 hr tablet Take 1 tablet (100 mg total) by mouth daily. Take with or immediately following a meal. 90 tablet 2  . Multiple Vitamin (MULTIVITAMIN WITH MINERALS) TABS tablet Take 1 tablet by mouth daily.    . ranitidine (ZANTAC) 300 MG tablet TAKE ONE TABLET BY MOUTH AT BEDTIME (REPLACES  OMEPRAZOLE) 30 tablet 5  . simvastatin (ZOCOR) 10 MG tablet Take 10 mg by mouth daily.    . vitamin B-12 (CYANOCOBALAMIN) 1000 MCG tablet Take 1,000 mcg by mouth daily.     No current facility-administered medications for this visit.    Facility-Administered Medications Ordered in Other Visits  Medication Dose Route Frequency Provider Last Rate Last Dose  . sodium chloride 0.9 %  injection 10 mL  10 mL Intracatheter PRN Lloyd Huger, MD   10 mL at 11/30/14 1205    OBJECTIVE: Vitals:   09/13/15 1416  BP: 111/79  Pulse: 63  Resp: 17  Temp: (!) 96.6 F (35.9 C)     Body mass index is 39.28 kg/m.    ECOG FS:0 - Asymptomatic  General: Well-developed, well-nourished, no acute distress. Eyes: Pink conjunctiva, anicteric sclera. Breasts: Exam deferred today. Lungs: Clear to auscultation bilaterally. Heart: Regular rate and rhythm. No rubs, murmurs, or gallops. Abdomen: Soft, nontender, nondistended. No organomegaly noted, normoactive bowel sounds. Musculoskeletal: No edema, cyanosis, or clubbing. Neuro: Alert, answering all questions appropriately. Cranial nerves grossly intact. Skin: No rashes or petechiae noted. Psych: Normal affect.   LAB RESULTS:  Lab Results  Component Value Date   NA 136 09/13/2015   K 3.8 09/13/2015   CL 106 09/13/2015   CO2 26 09/13/2015   GLUCOSE 113 (H) 09/13/2015   BUN 24 (H) 09/13/2015   CREATININE 0.98 09/13/2015   CALCIUM 8.5 (L) 09/13/2015   PROT 6.5 09/13/2015   ALBUMIN 3.7 09/13/2015   AST 19 09/13/2015   ALT 16 09/13/2015   ALKPHOS 92 09/13/2015   BILITOT 0.9 09/13/2015   GFRNONAA >60 09/13/2015   GFRAA >60 09/13/2015     Lab Results  Component Value Date   WBC 7.1 09/13/2015   NEUTROABS 4.1 09/13/2015   HGB 10.3 (L) 09/13/2015   HCT 30.4 (L) 09/13/2015   MCV 87.8 09/13/2015   PLT 219 09/13/2015     STUDIES: Mm Diag Breast Tomo Bilateral  Result Date: 08/16/2015 CLINICAL DATA:  Personal history of left breast cancer status post lumpectomy 2016 EXAM: 2D DIGITAL DIAGNOSTIC BILATERAL MAMMOGRAM WITH CAD AND ADJUNCT TOMO COMPARISON:  Previous exam(s). ACR Breast Density Category b: There are scattered areas of fibroglandular density. FINDINGS: Cc and MLO views of bilateral breasts, spot tangential view of left breast are submitted. Postsurgical changes are identified within the left breast. No suspicious abnormalities identified bilaterally. Mammographic images were processed with CAD. IMPRESSION: Benign findings. RECOMMENDATION: Bilateral diagnostic mammogram in 1 year. I have discussed the findings and recommendations with the patient. Results were also provided in writing at the conclusion of the visit. If applicable, a reminder letter will be sent to the patient regarding the next appointment. BI-RADS CATEGORY  2: Benign. Electronically Signed   By: Abelardo Diesel M.D.   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  PLAN:    1. 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 Patient's MUGA scan from July 18, 2015 reported an EF of 53%.  Given patient's persistent side effects, chemotherapy was discontinued altogether after 3 cycles. Proceed with cycle 17 of 18 of Herceptin only today. Return to clinic in 3 weeks for consideration of cycle 18.  She does not require an aromatase inhibitor given the fact that her tumor is ER/PR negative.   2. Anemia: Hemoglobin is stable at 10.3 today. Monitor 3. Headaches: MRI April 29, 2015 was normal.  4.  Right shoulder pain: Likely musculoskeletal in nature. We will consider imaging at a later date if patient is interested. 5. Nausea: Possibly related to recent retinal tear. Continue Compazine as needed. Monitor.  Patient expressed understanding and was in agreement with this plan. She also understands that She can call clinic at  any time with any questions, concerns, or complaints.   Breast cancer, female   Staging form: Breast, AJCC 7th Edition     Pathologic stage from 08/17/2014: Stage IA (T1c, N0, cM0) - Signed by Lloyd Huger, MD on 08/17/2014  Lloyd Huger, MD   09/13/2015 10:55 PM

## 2015-09-13 ENCOUNTER — Encounter: Payer: Self-pay | Admitting: Oncology

## 2015-09-13 ENCOUNTER — Inpatient Hospital Stay: Payer: Medicaid Other

## 2015-09-13 ENCOUNTER — Inpatient Hospital Stay (HOSPITAL_BASED_OUTPATIENT_CLINIC_OR_DEPARTMENT_OTHER): Payer: Medicaid Other | Admitting: Oncology

## 2015-09-13 VITALS — BP 111/79 | HR 63 | Temp 96.6°F | Resp 17 | Ht 66.0 in | Wt 243.4 lb

## 2015-09-13 DIAGNOSIS — M25511 Pain in right shoulder: Secondary | ICD-10-CM

## 2015-09-13 DIAGNOSIS — C50512 Malignant neoplasm of lower-outer quadrant of left female breast: Secondary | ICD-10-CM

## 2015-09-13 DIAGNOSIS — D649 Anemia, unspecified: Secondary | ICD-10-CM | POA: Diagnosis not present

## 2015-09-13 DIAGNOSIS — G4700 Insomnia, unspecified: Secondary | ICD-10-CM

## 2015-09-13 DIAGNOSIS — Z170 Estrogen receptor positive status [ER+]: Secondary | ICD-10-CM | POA: Diagnosis not present

## 2015-09-13 DIAGNOSIS — N3281 Overactive bladder: Secondary | ICD-10-CM

## 2015-09-13 DIAGNOSIS — Z5112 Encounter for antineoplastic immunotherapy: Secondary | ICD-10-CM | POA: Diagnosis not present

## 2015-09-13 DIAGNOSIS — E669 Obesity, unspecified: Secondary | ICD-10-CM

## 2015-09-13 DIAGNOSIS — R51 Headache: Secondary | ICD-10-CM

## 2015-09-13 DIAGNOSIS — I10 Essential (primary) hypertension: Secondary | ICD-10-CM

## 2015-09-13 DIAGNOSIS — E039 Hypothyroidism, unspecified: Secondary | ICD-10-CM

## 2015-09-13 DIAGNOSIS — Z809 Family history of malignant neoplasm, unspecified: Secondary | ICD-10-CM

## 2015-09-13 DIAGNOSIS — F329 Major depressive disorder, single episode, unspecified: Secondary | ICD-10-CM

## 2015-09-13 DIAGNOSIS — C50912 Malignant neoplasm of unspecified site of left female breast: Secondary | ICD-10-CM

## 2015-09-13 DIAGNOSIS — Z803 Family history of malignant neoplasm of breast: Secondary | ICD-10-CM

## 2015-09-13 DIAGNOSIS — Z79899 Other long term (current) drug therapy: Secondary | ICD-10-CM

## 2015-09-13 DIAGNOSIS — Z7982 Long term (current) use of aspirin: Secondary | ICD-10-CM

## 2015-09-13 DIAGNOSIS — I252 Old myocardial infarction: Secondary | ICD-10-CM

## 2015-09-13 DIAGNOSIS — Z8669 Personal history of other diseases of the nervous system and sense organs: Secondary | ICD-10-CM

## 2015-09-13 DIAGNOSIS — R0602 Shortness of breath: Secondary | ICD-10-CM

## 2015-09-13 DIAGNOSIS — K219 Gastro-esophageal reflux disease without esophagitis: Secondary | ICD-10-CM

## 2015-09-13 DIAGNOSIS — I319 Disease of pericardium, unspecified: Secondary | ICD-10-CM

## 2015-09-13 DIAGNOSIS — R110 Nausea: Secondary | ICD-10-CM

## 2015-09-13 DIAGNOSIS — I2510 Atherosclerotic heart disease of native coronary artery without angina pectoris: Secondary | ICD-10-CM

## 2015-09-13 DIAGNOSIS — M2550 Pain in unspecified joint: Secondary | ICD-10-CM

## 2015-09-13 LAB — CBC WITH DIFFERENTIAL/PLATELET
Basophils Absolute: 0.1 10*3/uL (ref 0–0.1)
Basophils Relative: 1 %
EOS PCT: 4 %
Eosinophils Absolute: 0.3 10*3/uL (ref 0–0.7)
HCT: 30.4 % — ABNORMAL LOW (ref 35.0–47.0)
Hemoglobin: 10.3 g/dL — ABNORMAL LOW (ref 12.0–16.0)
LYMPHS ABS: 1.9 10*3/uL (ref 1.0–3.6)
LYMPHS PCT: 26 %
MCH: 29.7 pg (ref 26.0–34.0)
MCHC: 33.8 g/dL (ref 32.0–36.0)
MCV: 87.8 fL (ref 80.0–100.0)
MONO ABS: 0.8 10*3/uL (ref 0.2–0.9)
MONOS PCT: 11 %
Neutro Abs: 4.1 10*3/uL (ref 1.4–6.5)
Neutrophils Relative %: 58 %
PLATELETS: 219 10*3/uL (ref 150–440)
RBC: 3.47 MIL/uL — AB (ref 3.80–5.20)
RDW: 13.5 % (ref 11.5–14.5)
WBC: 7.1 10*3/uL (ref 3.6–11.0)

## 2015-09-13 LAB — COMPREHENSIVE METABOLIC PANEL
ALT: 16 U/L (ref 14–54)
AST: 19 U/L (ref 15–41)
Albumin: 3.7 g/dL (ref 3.5–5.0)
Alkaline Phosphatase: 92 U/L (ref 38–126)
Anion gap: 4 — ABNORMAL LOW (ref 5–15)
BUN: 24 mg/dL — ABNORMAL HIGH (ref 6–20)
CALCIUM: 8.5 mg/dL — AB (ref 8.9–10.3)
CHLORIDE: 106 mmol/L (ref 101–111)
CO2: 26 mmol/L (ref 22–32)
CREATININE: 0.98 mg/dL (ref 0.44–1.00)
Glucose, Bld: 113 mg/dL — ABNORMAL HIGH (ref 65–99)
Potassium: 3.8 mmol/L (ref 3.5–5.1)
Sodium: 136 mmol/L (ref 135–145)
Total Bilirubin: 0.9 mg/dL (ref 0.3–1.2)
Total Protein: 6.5 g/dL (ref 6.5–8.1)

## 2015-09-13 MED ORDER — SODIUM CHLORIDE 0.9 % IJ SOLN
10.0000 mL | Freq: Once | INTRAMUSCULAR | Status: AC
  Administered 2015-09-13: 10 mL via INTRAVENOUS
  Filled 2015-09-13: qty 10

## 2015-09-13 MED ORDER — DIPHENHYDRAMINE HCL 25 MG PO CAPS
50.0000 mg | ORAL_CAPSULE | Freq: Once | ORAL | Status: AC
  Administered 2015-09-13: 50 mg via ORAL
  Filled 2015-09-13: qty 2

## 2015-09-13 MED ORDER — SODIUM CHLORIDE 0.9 % IV SOLN
Freq: Once | INTRAVENOUS | Status: AC
  Administered 2015-09-13: 15:00:00 via INTRAVENOUS
  Filled 2015-09-13: qty 1000

## 2015-09-13 MED ORDER — HEPARIN SOD (PORK) LOCK FLUSH 100 UNIT/ML IV SOLN
500.0000 [IU] | Freq: Once | INTRAVENOUS | Status: AC
  Administered 2015-09-13: 500 [IU] via INTRAVENOUS

## 2015-09-13 MED ORDER — SODIUM CHLORIDE 0.9 % IV SOLN
6.0000 mg/kg | Freq: Once | INTRAVENOUS | Status: AC
  Administered 2015-09-13: 651 mg via INTRAVENOUS
  Filled 2015-09-13: qty 31

## 2015-09-13 MED ORDER — ACETAMINOPHEN 325 MG PO TABS
650.0000 mg | ORAL_TABLET | Freq: Once | ORAL | Status: AC
  Administered 2015-09-13: 650 mg via ORAL
  Filled 2015-09-13: qty 2

## 2015-09-13 MED ORDER — HEPARIN SOD (PORK) LOCK FLUSH 100 UNIT/ML IV SOLN
250.0000 [IU] | Freq: Once | INTRAVENOUS | Status: AC | PRN
  Filled 2015-09-13: qty 5

## 2015-09-13 NOTE — Progress Notes (Signed)
Pt complaining of sore throat and headache today accompanied by nausea.  Pt feels she has been more tired these las couple weeks.

## 2015-09-22 ENCOUNTER — Ambulatory Visit (INDEPENDENT_AMBULATORY_CARE_PROVIDER_SITE_OTHER): Payer: Medicaid Other | Admitting: Family Medicine

## 2015-09-22 ENCOUNTER — Encounter: Payer: Self-pay | Admitting: Family Medicine

## 2015-09-22 DIAGNOSIS — D649 Anemia, unspecified: Secondary | ICD-10-CM

## 2015-09-22 DIAGNOSIS — C50512 Malignant neoplasm of lower-outer quadrant of left female breast: Secondary | ICD-10-CM

## 2015-09-22 DIAGNOSIS — R5382 Chronic fatigue, unspecified: Secondary | ICD-10-CM

## 2015-09-22 DIAGNOSIS — I2510 Atherosclerotic heart disease of native coronary artery without angina pectoris: Secondary | ICD-10-CM | POA: Diagnosis not present

## 2015-09-22 DIAGNOSIS — F3341 Major depressive disorder, recurrent, in partial remission: Secondary | ICD-10-CM

## 2015-09-22 DIAGNOSIS — I10 Essential (primary) hypertension: Secondary | ICD-10-CM | POA: Diagnosis not present

## 2015-09-22 LAB — BASIC METABOLIC PANEL WITH GFR
BUN: 20 mg/dL (ref 7–25)
CO2: 26 mmol/L (ref 20–31)
Calcium: 9.1 mg/dL (ref 8.6–10.4)
Chloride: 103 mmol/L (ref 98–110)
Creat: 1.01 mg/dL (ref 0.50–1.05)
GFR, Est African American: 73 mL/min (ref >=60)
GFR, Est Non African American: 64 mL/min (ref >=60)
Glucose, Bld: 97 mg/dL (ref 65–99)
Potassium: 4.3 mmol/L (ref 3.5–5.3)
Sodium: 137 mmol/L (ref 135–146)

## 2015-09-22 LAB — MAGNESIUM: Magnesium: 2 mg/dL (ref 1.5–2.5)

## 2015-09-22 LAB — RETICULOCYTES
ABS Retic: 52800 cells/uL (ref 20000–80000)
RBC.: 3.52 MIL/uL — ABNORMAL LOW (ref 3.80–5.10)
Retic Ct Pct: 1.5 %

## 2015-09-22 LAB — PHOSPHORUS: PHOSPHORUS: 3.8 mg/dL (ref 2.5–4.5)

## 2015-09-22 LAB — FERRITIN: Ferritin: 11 ng/mL (ref 10–232)

## 2015-09-22 LAB — VITAMIN B12: Vitamin B-12: 1042 pg/mL (ref 200–1100)

## 2015-09-22 MED ORDER — AZELASTINE HCL 0.1 % NA SOLN: 2.0000 | Freq: Two times a day (BID) | NASAL | 12 refills | Status: DC

## 2015-09-22 NOTE — Assessment & Plan Note (Signed)
Check phos, vit D

## 2015-09-22 NOTE — Progress Notes (Signed)
BP 120/78   Pulse 86   Temp 99.8 F (37.7 C)   Wt 241 lb (109.3 kg)   LMP 08/26/2012   SpO2 96%   BMI 38.90 kg/m    Subjective:    Patient ID: Robin Howard, female    DOB: [birth year], 55 y.o.   MRN: [id number]  HPI: Robin Howard is a 55 y.o. female  Chief Complaint  Patient presents with  . Follow-up    for thyroid  . Sore Throat   Patient is here for follow-up She has hypothyroidism; last TSH 0.89 Energy level is not very good Taking vitamin D 1000 iu daily; taking B12 something OTC  Low calcium level 8.5; some muscle cramps Glucose 113 Anemia H/H 10.3/30.4 She has been metoprolol, was very high and she has been on that for quite a while Not taking benadryl often  She thinks something going on in her sinuses; since her treatments, she lost hair plus the hair in her nose, constantly running; hair has come back; every time she bends over, just gushes, but it is thick, not watery  She is undergoing treatment for breast cancer, followed regularly at the cancer center  Depression screen [facility] 2/9 09/22/2015 09/22/2015 07/15/2014  Decreased Interest 1 0 3  Down, Depressed, Hopeless 1 0 1  PHQ - 2 Score 2 0 4  Altered sleeping 0 - 3  Tired, decreased energy 1 - 3  Change in appetite 0 - 1  Feeling bad or failure about yourself  1 - 1  Trouble concentrating 1 - 2  Moving slowly or fidgety/restless 0 - 1  Suicidal thoughts 0 - 1  PHQ-9 Score 5 - 16  Difficult doing work/chores Somewhat difficult - -   Relevant past medical, surgical, family and social history reviewed Past Medical History:  Diagnosis Date  . Anemia    h/o  . Breast cancer ([place]) 07/2014   invasive mammary carcinoma with mammosite and chemo tx  . Bronchitis 04-2014  . Chronic headache   . Coronary artery disease     small non-ST elevation myocardial infarction in April 2016 in January 2017. Cardiac catheterization in April 2016 showed mild nonobstructive disease affecting the LAD with tortuous vessels.  CTA in 2017 showed only 30% disease in the LAD.  Marland Kitchen Detached retina 1997  . Family history of adverse reaction to anesthesia    pts dad has pseudocholinestrace deficieny  . Gastritis   . GERD (gastroesophageal reflux disease)   . HTN (hypertension)   . Hypothyroidism   . Insomnia   . Last menstrual period (LMP) > 10 days ago    LMP 2014  . Major depressive disorder, recurrent ([place])   . Migraines   . Morbid obesity ([place])   . NSTEMI (non-ST elevated myocardial infarction) ([place]) 04/16/14   Due to demand- normal Cath 04/19/14  . NSTEMI (non-ST elevated myocardial infarction) ([place]) 01-24-15  . OSA (obstructive sleep apnea)    untreated due to lack of insurance  . Overactive bladder   . Pericarditis 1997  . Pericarditis 1997  . Shortness of breath dyspnea    with exertion only   Past Surgical History:  Procedure Laterality Date  . BACK SURGERY     L4-5  . BREAST [facility]OPSY [facility]   [facility]  . BREAST CYST ASPIRATION Left 12/13/2014   3:00 position 2 cmfn, fat necrosis per Dr. Dwyane Luo notes  . BREAST LUMPECTOMY WITH SENTINEL LYMPH NODE BIOPSY Left 08/13/2014   Procedure: BREAST LUMPECTOMY  WITH SENTINEL LYMPH NODE BIOPSY;  Surgeon: Robert Bellow, MD;  Location: [facility] ORS;  Service: General;  Laterality: Left;  . BREAST MAMMOSITE Left 08/30/2014   Procedure: MAMMOSITE BREAST;  Surgeon: Robert Bellow, MD;  Location: [facility] ORS;  Service: General;  Laterality: Left;  . CESAREAN SECTION    . CORONARY ANGIOPLASTY  April 2016  . FOOT SURGERY    . KNEE SURGERY    . PORTACATH PLACEMENT Right 08/30/2014   Procedure: INSERTION PORT-A-CATH;  Surgeon: Robert Bellow, MD;  Location: [facility] ORS;  Service: General;  Laterality: Right;  . RETINAL DETACHMENT SURGERY Right 1997   Family History  Problem Relation Age of Onset  . Alcohol abuse Mother   . Thyroid disease Mother   . Hypertension Mother   . COPD Mother   . Cancer Father     liposarcoma  . Congestive Heart Failure Father     V Tach    . Depression Father   . Hypertension Father   . Heart attack Father   . Arrhythmia Father   . Heart failure Father   . Cancer Maternal Uncle     Pancreatic cancer  . Heart disease Paternal Grandfather 64    MI  . Diabetes Other   . Cancer Sister     breast  . Breast cancer Sister 36   Social History  Substance Use Topics  . Smoking status: Never Smoker  . Smokeless tobacco: Never Used  . Alcohol use No   Interim medical history since last visit reviewed. Allergies and medications reviewed  Review of Systems  HENT: Positive for postnasal drip and rhinorrhea.   Cardiovascular: Negative for chest pain.   Per HPI unless specifically indicated above     Objective:    BP 120/78   Pulse 86   Temp 99.8 F (37.7 C)   Wt 241 lb (109.3 kg)   LMP 08/26/2012   SpO2 96%   BMI 38.90 kg/m   Wt Readings from Last 3 Encounters:  09/22/15 241 lb (109.3 kg)  09/13/15 243 lb 6.2 oz (110.4 kg)  08/30/15 243 lb (110.2 kg)    Physical Exam  Constitutional: She appears well-developed and well-nourished.  Non-toxic appearance. No distress.  HENT:  Right Ear: Hearing, tympanic membrane, external ear and ear canal normal. No drainage. Tympanic membrane is not erythematous. No middle ear effusion.  Left Ear: Hearing, tympanic membrane, external ear and ear canal normal. No drainage. Tympanic membrane is not erythematous.  No middle ear effusion.  Nose: Rhinorrhea present. No mucosal edema.  Mouth/Throat: Oropharynx is clear and moist and mucous membranes are normal.  Cardiovascular: Normal rate and regular rhythm.   Pulmonary/Chest: Effort normal and breath sounds normal.  Lymphadenopathy:    She has no cervical adenopathy.  Neurological: She displays no tremor.  No tics  Skin: She is not diaphoretic. No pallor.  No nailbed pallor  Psychiatric: She has a normal mood and affect. Her mood appears not anxious. She does not exhibit a depressed mood.  Good eye contact with examiner    Results for orders placed or performed in visit on 09/13/15  CBC with Differential  Result Value Ref Range   WBC 7.1 3.6 - 11.0 K/uL   RBC 3.47 (L) 3.80 - 5.20 MIL/uL   Hemoglobin 10.3 (L) 12.0 - 16.0 g/dL   HCT 30.4 (L) 35.0 - 47.0 %   MCV 87.8 80.0 - 100.0 fL   MCH 29.7 26.0 - 34.0 pg   MCHC  33.8 32.0 - 36.0 g/dL   RDW 13.5 11.5 - 14.5 %   Platelets 219 150 - 440 K/uL   Neutrophils Relative % 58 %   Neutro Abs 4.1 1.4 - 6.5 K/uL   Lymphocytes Relative 26 %   Lymphs Abs 1.9 1.0 - 3.6 K/uL   Monocytes Relative 11 %   Monocytes Absolute 0.8 0.2 - 0.9 K/uL   Eosinophils Relative 4 %   Eosinophils Absolute 0.3 0 - 0.7 K/uL   Basophils Relative 1 %   Basophils Absolute 0.1 0 - 0.1 K/uL  Comprehensive metabolic panel  Result Value Ref Range   Sodium 136 135 - 145 mmol/L   Potassium 3.8 3.5 - 5.1 mmol/L   Chloride 106 101 - 111 mmol/L   CO2 26 22 - 32 mmol/L   Glucose, Bld 113 (H) 65 - 99 mg/dL   BUN 24 (H) 6 - 20 mg/dL   Creatinine, Ser 0.98 0.44 - 1.00 mg/dL   Calcium 8.5 (L) 8.9 - 10.3 mg/dL   Total Protein 6.5 6.5 - 8.1 g/dL   Albumin 3.7 3.5 - 5.0 g/dL   AST 19 15 - 41 U/L   ALT 16 14 - 54 U/L   Alkaline Phosphatase 92 38 - 126 U/L   Total Bilirubin 0.9 0.3 - 1.2 mg/dL   GFR calc non Af Amer >60 >60 mL/min   GFR calc Af Amer >60 >60 mL/min   Anion gap 4 (L) 5 - 15      Assessment & Plan:   Problem List Items Addressed This Visit      Cardiovascular and Mediastinum   HTN (hypertension)    Controlled with current medicine; heart rate not so low that I suspect the beta-blocker is causing fatigue; leave medicine at current doses      Coronary artery disease    Managed by Dr. Fletcher Anon        Other   Major depressive disorder, recurrent (HCC)    Stable, continue escitalopram      Hypocalcemia    Check phos, vit D      Relevant Orders   Phosphorus   BASIC METABOLIC PANEL WITH GFR   Fatigue    Check additional labs      Relevant Orders   Vitamin B12    VITAMIN D 25 Hydroxy (Vit-D Deficiency, Fractures)   Magnesium   Breast cancer of lower-outer quadrant of left female breast [facility])    Managed by oncologist      Anemia    Check additional labs      Relevant Orders   Ferritin   Retic    Other Visit Diagnoses   None.     Follow up plan: Return in about 6 months (around 03/21/2016) for regular follow-up.  An after-visit summary was printed and given to the patient at  [facility].  Please see the patient instructions which may contain other information and recommendations beyond what is mentioned above in the assessment and plan.  Meds ordered this encounter  Medications  . ascorbic acid (VITAMIN C) 250 MG CHEW    Sig: Chew 250 mg by mouth daily.  Marland Kitchen azelastine (ASTELIN) 0.1 % nasal spray    Sig: Place 2 sprays into both nostrils 2 (two) times daily. Use in each nostril as directed    Dispense:  30 mL    Refill:  12    Orders Placed This Encounter  Procedures  . Vitamin B12  . VITAMIN D 25 Hydroxy (Vit-D Deficiency, Fractures)  .  Ferritin  . Retic  . Magnesium  . Phosphorus  . BASIC METABOLIC PANEL WITH GFR

## 2015-09-22 NOTE — Assessment & Plan Note (Signed)
Controlled with current medicine; heart rate not so low that I suspect the beta-blocker is causing fatigue; leave medicine at current doses

## 2015-09-22 NOTE — Assessment & Plan Note (Signed)
Managed by oncologist 

## 2015-09-22 NOTE — Patient Instructions (Addendum)
Start the new nasal spray Call me if symptoms are not resolved in 2 weeks (to ENT if needed) Caution: prolonged use of proton pump inhibitors like omeprazole (Prilosec), pantoprazole (Protonix), esomeprazole (Nexium), and others like Dexilant and Aciphex may increase your risk of pneumonia, Clostridium difficile colitis, osteoporosis, anemia and other health complications Try to limit or avoid triggers like coffee, caffeinated beverages, onions, chocolate, spicy foods, peppermint, acid foods like pizza, spaghetti sauce, and orange juice Lose weight if you are overweight or obese Try elevating the head of your bed by placing a small wedge between your mattress and box springs to keep acid in the stomach at night instead of coming up into your esophagus Let's get labs today Do try to start walking and build up to 150 minutes per week Please do get fitted for the sleep apnea CPAP machine

## 2015-09-22 NOTE — Assessment & Plan Note (Signed)
Check additional labs 

## 2015-09-22 NOTE — Assessment & Plan Note (Signed)
Stable, continue escitalopram

## 2015-09-22 NOTE — Assessment & Plan Note (Signed)
Managed by Dr. Fletcher Anon

## 2015-09-23 LAB — VITAMIN D 25 HYDROXY (VIT D DEFICIENCY, FRACTURES): Vit D, 25-Hydroxy: 61 ng/mL (ref 30–100)

## 2015-10-03 NOTE — Progress Notes (Signed)
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 She and episodes of loose stools this morning, but otherwise has felt well. She recently had laser surgery for her torn retina.  She has no neurologic complaints. She denies any recent fevers. She has no chest pain or shortness of breath. She denies any nausea, vomiting, constipation, or diarrhea. She has no urinary complaints. Patient offers no further specific complaints today.  REVIEW OF SYSTEMS:   Review of Systems  Constitutional: Negative.  Negative for fever and malaise/fatigue.  HENT: Negative for congestion and sore throat.   Eyes: Negative for blurred vision and pain.  Respiratory: Negative for cough.   Cardiovascular: Negative.  Negative for chest pain.  Gastrointestinal: Negative for abdominal pain, diarrhea and nausea.  Genitourinary: Negative.   Musculoskeletal: Positive for joint pain. Negative for back pain.  Neurological: Negative for weakness and headaches.  Psychiatric/Behavioral: Negative.     As per HPI. Otherwise, a complete review of systems is negatve.  PAST MEDICAL HISTORY: Past Medical History:  Diagnosis Date  . Anemia    h/o  . Breast cancer ([place]) 07/2014   invasive mammary carcinoma with mammosite and chemo tx  . Bronchitis 04-2014  . Chronic headache   . Coronary artery disease     small non-ST elevation myocardial infarction in April  2016 in January 2017. Cardiac catheterization in April 2016 showed mild nonobstructive disease affecting the LAD with tortuous vessels. CTA in 2017 showed only 30% disease in the LAD.  Marland Kitchen Detached retina 1997  . Family history of adverse reaction to anesthesia    pts dad has pseudocholinestrace deficieny  . Gastritis   . GERD (gastroesophageal reflux disease)   . HTN (hypertension)   . Hypothyroidism   . Insomnia   . Last menstrual period (LMP) > 10 days ago    LMP 2014  . Major depressive disorder, recurrent ([place])   . Migraines   . Morbid obesity ([place])   . NSTEMI (non-ST elevated myocardial infarction) ([place]) 04/16/14   Due to demand- normal Cath 04/19/14  . NSTEMI (non-ST elevated myocardial infarction) ([place]) 01-24-15  . OSA (obstructive sleep apnea)    untreated due to lack of insurance  . Overactive bladder   . Pericarditis 1997  . Pericarditis 1997  . Shortness of breath dyspnea    with exertion only    PAST SURGICAL HISTORY: Past Surgical History:  Procedure Laterality Date  . BACK SURGERY     L4-5  . BREAST BIOPSY Left 07/2014   [facility]  . BREAST CYST ASPIRATION Left 12/13/2014   3:00 position 2 cmfn, fat necrosis per Dr. Dwyane Luo notes  . BREAST LUMPECTOMY WITH SENTINEL LYMPH NODE BIOPSY Left 08/13/2014   Procedure: BREAST LUMPECTOMY WITH SENTINEL LYMPH NODE BIOPSY;  Surgeon: Robert Bellow, MD;  Location: [facility] ORS;  Service: General;  Laterality: Left;  . BREAST MAMMOSITE Left 08/30/2014   Procedure: MAMMOSITE BREAST;  Surgeon: Robert Bellow, MD;  Location: [facility] ORS;  Service: General;  Laterality: Left;  . CESAREAN SECTION    . CORONARY ANGIOPLASTY  April 2016  . FOOT SURGERY    . KNEE SURGERY    . PORTACATH PLACEMENT Right 08/30/2014   Procedure: INSERTION PORT-A-CATH;  Surgeon: Robert Bellow, MD;  Location: [facility] ORS;  Service: General;  Laterality: Right;  . RETINAL DETACHMENT SURGERY Right 1997    FAMILY HISTORY Family History  Problem Relation Age of Onset   . Alcohol abuse Mother   . Thyroid disease Mother   . Hypertension Mother   . COPD Mother   . Cancer Father     liposarcoma  . Congestive Heart Failure Father     V Tach  . Depression Father   . Hypertension Father   . Heart attack Father   . Arrhythmia Father   . Heart failure Father   . Cancer Maternal Uncle     Pancreatic cancer  . Heart disease Paternal Grandfather 71    MI  . Diabetes Other   . Cancer Sister     breast  . Breast cancer Sister 47       ADVANCED DIRECTIVES:    HEALTH MAINTENANCE: Social History  Substance Use Topics  . Smoking status: Never Smoker  . Smokeless tobacco: Never Used  . Alcohol use No    Allergies  Allergen Reactions  . Reglan [Metoclopramide] Shortness Of Breath and Other (See Comments)    This medication caused a dystonic reaction.    Marland Kitchen Paxil [Paroxetine Hcl] Other (See Comments)    Reaction:  Blurred vision  . Tegaderm Ag Mesh [Silver] Rash  . Ultram [Tramadol] Itching and Rash    Current Outpatient Prescriptions  Medication Sig Dispense Refill  . acetaminophen (TYLENOL) 500 MG tablet Take 1,000 mg by mouth every 6 (six) hours as needed for mild pain or headache.    Marland Kitchen ascorbic acid (VITAMIN C) 250 MG CHEW Chew 250 mg by mouth daily.    Marland Kitchen aspirin EC 81 MG tablet Take 81 mg by mouth daily.    Marland Kitchen azelastine (ASTELIN) 0.1 % nasal spray Place 2 sprays into both nostrils 2 (two) times daily. Use in each nostril as directed 30 mL 12  . CALCIUM PO Take 1 tablet by mouth 2 (two) times daily.     . cholecalciferol (VITAMIN D) 1000 UNITS tablet Take 1,000 Units by mouth daily.    . diphenhydrAMINE (BENADRYL) 25 MG tablet Take 25 mg by mouth every 6 (six) hours as needed for allergies.     Marland Kitchen escitalopram (LEXAPRO) 20 MG tablet TAKE ONE TABLET BY MOUTH ONCE DAILY 30 tablet 5  . hydrALAZINE (APRESOLINE) 25 MG tablet Take 25 mg by mouth 2 (two) times daily.    Marland Kitchen levothyroxine (SYNTHROID, LEVOTHROID) 100 MCG tablet Take 1 tablet (100 mcg  total) by mouth daily. 30 tablet 6  . lidocaine-prilocaine (EMLA) cream Apply 1 application topically as needed (prior to accessing port).    . metoprolol succinate (TOPROL-XL) 100 MG 24 hr tablet Take 1 tablet (100 mg total) by mouth daily. Take with or immediately following a meal. 90 tablet 2  . Multiple Vitamin (MULTIVITAMIN WITH MINERALS) TABS tablet Take 1 tablet by mouth daily.    . ranitidine (ZANTAC) 300 MG tablet TAKE ONE TABLET BY MOUTH AT BEDTIME (REPLACES  OMEPRAZOLE) 30 tablet 5  . vitamin B-12 (CYANOCOBALAMIN) 1000 MCG tablet Take 1,000 mcg by mouth daily.     No current facility-administered medications for this visit.  Facility-Administered Medications Ordered in Other Visits  Medication Dose Route Frequency Provider Last Rate Last Dose  . heparin lock flush 100 unit/mL  500 Units Intracatheter Once PRN Lloyd Huger, MD      . sodium chloride 0.9 % injection 10 mL  10 mL Intracatheter PRN Lloyd Huger, MD   10 mL at 11/30/14 1205  . trastuzumab (HERCEPTIN) 651 mg in sodium chloride 0.9 % 250 mL chemo infusion  6 mg/kg (Treatment Plan Recorded) Intravenous Once Lloyd Huger, MD   Stopped at 10/04/15 1616    OBJECTIVE: Vitals:   10/04/15 1351  BP: 128/78  Pulse: 64  Resp: 18  Temp: (!) 96.8 F (36 C)     Body mass index is 39.21 kg/m.    ECOG FS:0 - Asymptomatic  General: Well-developed, well-nourished, no acute distress. Eyes: Pink conjunctiva, anicteric sclera. Breasts: Exam deferred today. Lungs: Clear to auscultation bilaterally. Heart: Regular rate and rhythm. No rubs, murmurs, or gallops. Abdomen: Soft, nontender, nondistended. No organomegaly noted, normoactive bowel sounds. Musculoskeletal: No edema, cyanosis, or clubbing. Neuro: Alert, answering all questions appropriately. Cranial nerves grossly intact. Skin: No rashes or petechiae noted. Psych: Normal affect.   LAB RESULTS:  Lab Results  Component Value Date   NA 136 10/04/2015     K 4.0 10/04/2015   CL 104 10/04/2015   CO2 26 10/04/2015   GLUCOSE 119 (H) 10/04/2015   BUN 18 10/04/2015   CREATININE 1.07 (H) 10/04/2015   CALCIUM 8.8 (L) 10/04/2015   PROT 6.7 10/04/2015   ALBUMIN 4.0 10/04/2015   AST 23 10/04/2015   ALT 15 10/04/2015   ALKPHOS 84 10/04/2015   BILITOT 0.9 10/04/2015   GFRNONAA 58 (L) 10/04/2015   GFRAA >60 10/04/2015    Lab Results  Component Value Date   WBC 7.1 09/13/2015   NEUTROABS 4.1 09/13/2015   HGB 10.3 (L) 09/13/2015   HCT 30.4 (L) 09/13/2015   MCV 87.8 09/13/2015   PLT 219 09/13/2015     STUDIES: No results found.  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  PLAN:    1. 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 Patient's MUGA scan from July 18, 2015 reported an EF of 53%.  Given patient's persistent side effects, chemotherapy was discontinued altogether after 3 cycles. Proceed with cycle 18 of 18 of Herceptin only today. Patient has now completed her maintenance Herceptin and will return to clinic in 3 months for further evaluation. Her most recent mammogram on August 16, 2015 was reported as BI-RADS 2, repeat in August 2018.   2. Anemia: Mild, monitor. 3. Headaches: Patient does not complain of this today.  MRI April 29, 2015 was normal.  4. Right shoulder pain: Likely musculoskeletal in nature. We will consider imaging at a later date if patient is interested. 5. Nausea: Resolved. 6. Retinal tear: Continue treatment per ophthalmology.  Patient expressed understanding and was in agreement with this plan. She also understands that She can call clinic at any time with any questions, concerns, or complaints.   Breast cancer, female   Staging form: Breast, AJCC 7th Edition     Pathologic stage from 08/17/2014: Stage IA (T1c, N0, cM0) - Signed by Lloyd Huger, MD on  08/17/2014  Lloyd Huger, MD   10/04/2015 3:50 PM

## 2015-10-04 ENCOUNTER — Inpatient Hospital Stay: Payer: Medicaid Other

## 2015-10-04 ENCOUNTER — Inpatient Hospital Stay: Payer: Medicaid Other | Attending: Oncology | Admitting: Oncology

## 2015-10-04 VITALS — BP 128/78 | HR 64 | Temp 96.8°F | Resp 18 | Wt 242.9 lb

## 2015-10-04 DIAGNOSIS — I10 Essential (primary) hypertension: Secondary | ICD-10-CM | POA: Diagnosis not present

## 2015-10-04 DIAGNOSIS — Z5112 Encounter for antineoplastic immunotherapy: Secondary | ICD-10-CM | POA: Insufficient documentation

## 2015-10-04 DIAGNOSIS — M2550 Pain in unspecified joint: Secondary | ICD-10-CM

## 2015-10-04 DIAGNOSIS — G4700 Insomnia, unspecified: Secondary | ICD-10-CM

## 2015-10-04 DIAGNOSIS — I2510 Atherosclerotic heart disease of native coronary artery without angina pectoris: Secondary | ICD-10-CM | POA: Diagnosis not present

## 2015-10-04 DIAGNOSIS — Z808 Family history of malignant neoplasm of other organs or systems: Secondary | ICD-10-CM | POA: Diagnosis not present

## 2015-10-04 DIAGNOSIS — C50512 Malignant neoplasm of lower-outer quadrant of left female breast: Secondary | ICD-10-CM

## 2015-10-04 DIAGNOSIS — Z170 Estrogen receptor positive status [ER+]: Secondary | ICD-10-CM

## 2015-10-04 DIAGNOSIS — Z7982 Long term (current) use of aspirin: Secondary | ICD-10-CM | POA: Diagnosis not present

## 2015-10-04 DIAGNOSIS — R197 Diarrhea, unspecified: Secondary | ICD-10-CM | POA: Diagnosis not present

## 2015-10-04 DIAGNOSIS — Z79899 Other long term (current) drug therapy: Secondary | ICD-10-CM

## 2015-10-04 DIAGNOSIS — R0602 Shortness of breath: Secondary | ICD-10-CM | POA: Diagnosis not present

## 2015-10-04 DIAGNOSIS — F329 Major depressive disorder, single episode, unspecified: Secondary | ICD-10-CM

## 2015-10-04 DIAGNOSIS — I319 Disease of pericardium, unspecified: Secondary | ICD-10-CM

## 2015-10-04 DIAGNOSIS — I252 Old myocardial infarction: Secondary | ICD-10-CM

## 2015-10-04 DIAGNOSIS — M25511 Pain in right shoulder: Secondary | ICD-10-CM | POA: Diagnosis not present

## 2015-10-04 DIAGNOSIS — Z800 Family history of malignant neoplasm of digestive organs: Secondary | ICD-10-CM

## 2015-10-04 DIAGNOSIS — Z8669 Personal history of other diseases of the nervous system and sense organs: Secondary | ICD-10-CM | POA: Diagnosis not present

## 2015-10-04 DIAGNOSIS — K219 Gastro-esophageal reflux disease without esophagitis: Secondary | ICD-10-CM | POA: Diagnosis not present

## 2015-10-04 DIAGNOSIS — D649 Anemia, unspecified: Secondary | ICD-10-CM | POA: Diagnosis not present

## 2015-10-04 DIAGNOSIS — Z803 Family history of malignant neoplasm of breast: Secondary | ICD-10-CM | POA: Diagnosis not present

## 2015-10-04 DIAGNOSIS — E039 Hypothyroidism, unspecified: Secondary | ICD-10-CM | POA: Diagnosis not present

## 2015-10-04 DIAGNOSIS — E669 Obesity, unspecified: Secondary | ICD-10-CM

## 2015-10-04 LAB — COMPREHENSIVE METABOLIC PANEL
ALBUMIN: 4 g/dL (ref 3.5–5.0)
ALT: 15 U/L (ref 14–54)
AST: 23 U/L (ref 15–41)
Alkaline Phosphatase: 84 U/L (ref 38–126)
Anion gap: 6 (ref 5–15)
BUN: 18 mg/dL (ref 6–20)
CHLORIDE: 104 mmol/L (ref 101–111)
CO2: 26 mmol/L (ref 22–32)
CREATININE: 1.07 mg/dL — AB (ref 0.44–1.00)
Calcium: 8.8 mg/dL — ABNORMAL LOW (ref 8.9–10.3)
GFR calc Af Amer: >60 mL/min (ref >60)
GFR calc non Af Amer: 58 mL/min — ABNORMAL LOW (ref >60)
Glucose, Bld: 119 mg/dL — ABNORMAL HIGH (ref 65–99)
POTASSIUM: 4 mmol/L (ref 3.5–5.1)
SODIUM: 136 mmol/L (ref 135–145)
Total Bilirubin: 0.9 mg/dL (ref 0.3–1.2)
Total Protein: 6.7 g/dL (ref 6.5–8.1)

## 2015-10-04 LAB — CBC WITH DIFFERENTIAL/PLATELET
BASOS ABS: 0.1 10*3/uL (ref 0–0.1)
BASOS PCT: 1 %
EOS ABS: 0.3 10*3/uL (ref 0–0.7)
EOS PCT: 5 %
HCT: 30.6 % — ABNORMAL LOW (ref 35.0–47.0)
Hemoglobin: 10.6 g/dL — ABNORMAL LOW (ref 12.0–16.0)
LYMPHS PCT: 26 %
Lymphs Abs: 1.7 10*3/uL (ref 1.0–3.6)
MCH: 30 pg (ref 26.0–34.0)
MCHC: 34.6 g/dL (ref 32.0–36.0)
MCV: 86.8 fL (ref 80.0–100.0)
Monocytes Absolute: 0.8 10*3/uL (ref 0.2–0.9)
Monocytes Relative: 12 %
Neutro Abs: 3.7 10*3/uL (ref 1.4–6.5)
Neutrophils Relative %: 56 %
PLATELETS: 207 10*3/uL (ref 150–440)
RBC: 3.52 MIL/uL — AB (ref 3.80–5.20)
RDW: 13.2 % (ref 11.5–14.5)
WBC: 6.6 10*3/uL (ref 3.6–11.0)

## 2015-10-04 MED ORDER — ACETAMINOPHEN 325 MG PO TABS
650.0000 mg | ORAL_TABLET | Freq: Once | ORAL | Status: AC
  Administered 2015-10-04: 650 mg via ORAL
  Filled 2015-10-04: qty 2

## 2015-10-04 MED ORDER — SODIUM CHLORIDE 0.9 % IV SOLN
Freq: Once | INTRAVENOUS | Status: AC
  Administered 2015-10-04: 15:00:00 via INTRAVENOUS
  Filled 2015-10-04: qty 1000

## 2015-10-04 MED ORDER — HEPARIN SOD (PORK) LOCK FLUSH 100 UNIT/ML IV SOLN
500.0000 [IU] | Freq: Once | INTRAVENOUS | Status: AC | PRN
  Administered 2015-10-04: 500 [IU]
  Filled 2015-10-04: qty 5

## 2015-10-04 MED ORDER — DIPHENHYDRAMINE HCL 25 MG PO CAPS
25.0000 mg | ORAL_CAPSULE | Freq: Once | ORAL | Status: AC
  Administered 2015-10-04: 25 mg via ORAL
  Filled 2015-10-04: qty 1

## 2015-10-04 MED ORDER — SODIUM CHLORIDE 0.9 % IV SOLN
6.0000 mg/kg | Freq: Once | INTRAVENOUS | Status: AC
  Administered 2015-10-04: 651 mg via INTRAVENOUS
  Filled 2015-10-04: qty 31

## 2015-10-04 NOTE — Progress Notes (Signed)
States has been having upset stomach since this morning and has headache.

## 2015-10-11 ENCOUNTER — Other Ambulatory Visit
Admission: RE | Admit: 2015-10-11 | Discharge: 2015-10-11 | Disposition: A | Discharge status: Home / Self Care | Payer: Medicaid Other | Source: Ambulatory Visit | Attending: Family Medicine | Admitting: Family Medicine

## 2015-10-11 ENCOUNTER — Ambulatory Visit (INDEPENDENT_AMBULATORY_CARE_PROVIDER_SITE_OTHER): Payer: Medicaid Other | Admitting: Family Medicine

## 2015-10-11 ENCOUNTER — Encounter: Payer: Self-pay | Admitting: Family Medicine

## 2015-10-11 VITALS — BP 140/88 | HR 79 | Temp 99.6°F | Resp 16 | Wt 244.0 lb

## 2015-10-11 DIAGNOSIS — R05 Cough: Secondary | ICD-10-CM | POA: Insufficient documentation

## 2015-10-11 DIAGNOSIS — J029 Acute pharyngitis, unspecified: Secondary | ICD-10-CM

## 2015-10-11 DIAGNOSIS — R059 Cough, unspecified: Secondary | ICD-10-CM | POA: Insufficient documentation

## 2015-10-11 LAB — POCT RAPID STREP A (OFFICE): Rapid Strep A Screen: NEGATIVE

## 2015-10-11 MED ORDER — HYDROCOD POLST-CPM POLST ER 10-8 MG/5ML PO SUER: 5.0000 mL | Freq: Two times a day (BID) | ORAL | 0 refills | Status: DC | PRN

## 2015-10-11 MED ORDER — AZITHROMYCIN 250 MG PO TABS: ORAL_TABLET | ORAL | 0 refills | Status: DC

## 2015-10-11 NOTE — Progress Notes (Signed)
BP 140/88   Pulse 79   Temp 99.6 F (37.6 C) (Oral)   Resp 16   Wt 244 lb (110.7 kg)   LMP 08/26/2012   SpO2 97%   BMI 39.38 kg/m    Subjective:    Patient ID: Robin Howard, female    DOB: 19-Jun-1960, 55 y.o.   MRN: [id number]  HPI: Robin Howard is a 55 y.o. female  Chief Complaint  Patient presents with  . URI    onset 4 days and worsening, symptoms include: nausea diarrhea, sore throat, loss of voice, headache and cough   She has been sick since Saturday Voice is hoarse and squeaky Really sore throat; can't sleep Headache and cough; not bringing anything up Went to the beach for two days No known sick contacts No travel Neck a little sore Really barky cough; tired and wiped out   Depression screen [facility] 2/9 09/22/2015 09/22/2015 07/15/2014  Decreased Interest 1 0 3  Down, Depressed, Hopeless 1 0 1  PHQ - 2 Score 2 0 4  Altered sleeping 0 - 3  Tired, decreased energy 1 - 3  Change in appetite 0 - 1  Feeling bad or failure about yourself  1 - 1  Trouble concentrating 1 - 2  Moving slowly or fidgety/restless 0 - 1  Suicidal thoughts 0 - 1  PHQ-9 Score 5 - 16  Difficult doing work/chores Somewhat difficult - -   Relevant past medical, surgical, family and social history reviewed Past Medical History:  Diagnosis Date  . Anemia    h/o  . Breast cancer ([place]) 07/2014   invasive mammary carcinoma with mammosite and chemo tx  . Bronchitis 04-2014  . Chronic headache   . Coronary artery disease     small non-ST elevation myocardial infarction in April 2016 in January 2017. Cardiac catheterization in April 2016 showed mild nonobstructive disease affecting the LAD with tortuous vessels. CTA in 2017 showed only 30% disease in the LAD.  Marland Kitchen Detached retina 1997  . Family history of adverse reaction to anesthesia    pts dad has pseudocholinestrace deficieny  . Gastritis   . GERD (gastroesophageal reflux disease)   . HTN (hypertension)   . Hypothyroidism   . Insomnia   .  Last menstrual period (LMP) > 10 days ago    LMP 2014  . Major depressive disorder, recurrent ([place])   . Migraines   . Morbid obesity ([place])   . NSTEMI (non-ST elevated myocardial infarction) ([place]) 04/16/14   Due to demand- normal Cath 04/19/14  . NSTEMI (non-ST elevated myocardial infarction) ([place]) 01-24-15  . OSA (obstructive sleep apnea)    untreated due to lack of insurance  . Overactive bladder   . Pericarditis 1997  . Pericarditis 1997  . Shortness of breath dyspnea    with exertion only   Past Surgical History:  Procedure Laterality Date  . BACK SURGERY     L4-5  . BREAST [facility]OPSY [facility] 07/2014   [facility]  . BREAST CYST ASPIRATION Left 12/13/2014   3:00 position 2 cmfn, fat necrosis per Dr. Dwyane Luo notes  . BREAST LUMPECTOMY WITH SENTINEL LYMPH NODE BIOPSY Left 08/13/2014   Procedure: BREAST LUMPECTOMY WITH SENTINEL LYMPH NODE BIOPSY;  Surgeon: Robert Bellow, MD;  Location: [facility] ORS;  Service: General;  Laterality: Left;  . BREAST MAMMOSITE Left 08/30/2014   Procedure: MAMMOSITE BREAST;  Surgeon: Robert Bellow, MD;  Location: [facility] ORS;  Service: General;  Laterality: Left;  . CESAREAN  SECTION    . CORONARY ANGIOPLASTY  April 2016  . FOOT SURGERY    . KNEE SURGERY    . PORTACATH PLACEMENT Right 08/30/2014   Procedure: INSERTION PORT-A-CATH;  Surgeon: Robert Bellow, MD;  Location: [facility] OR[facility];  Service: General;  Laterality: Right;  . RETINAL DETACHMENT SURGERY Right 1997   Family History  Problem Relation Age of Onset  . Alcohol abuse Mother   . Thyroid disease Mother   . Hypertension Mother   . COPD Mother   . Cancer Father     liposarcoma  . Congestive Heart Failure Father     V Tach  . Depression Father   . Hypertension Father   . Heart attack Father   . Arrhythmia Father   . Heart failure Father   . Cancer Maternal Uncle     Pancreatic cancer  . Heart disease Paternal Grandfather 19    MI  . Diabetes Other   . Cancer Sister     breast  . Breast cancer  Sister 109   Social History  Substance Use Topics  . Smoking status: Never Smoker  . Smokeless tobacco: Never Used  . Alcohol use No    Interim medical history since last visit reviewed. Allergies and medications reviewed  Review of Systems Per HPI unless specifically indicated above     Objective:    BP 140/88   Pulse 79   Temp 99.6 F (37.6 C) (Oral)   Resp 16   Wt 244 lb (110.7 kg)   LMP 08/26/2012   SpO2 97%   BMI 39.38 kg/m   Wt Readings from Last 3 Encounters:  10/11/15 244 lb (110.7 kg)  10/04/15 242 lb 15.2 oz (110.2 kg)  09/22/15 241 lb (109.3 kg)    Physical Exam  Constitutional: She appears well-developed and well-nourished. No distress.  HENT:  Right Ear: Hearing, tympanic membrane, external ear and ear canal normal. No drainage. Tympanic membrane is not erythematous. No middle ear effusion.  Left Ear: Hearing, tympanic membrane, external ear and ear canal normal. No drainage. Tympanic membrane is not erythematous.  No middle ear effusion.  Nose: Rhinorrhea (clear) present. No mucosal edema.  Mouth/Throat: Mucous membranes are normal. Posterior oropharyngeal erythema (mild) present. No oropharyngeal exudate or posterior oropharyngeal edema.  Eyes: EOM are normal. No scleral icterus.  Neck: No thyromegaly present.  Cardiovascular: Normal rate.   Pulmonary/Chest: Effort normal and breath sounds normal. She has no decreased breath sounds. She has no wheezes. She has no rhonchi.  Deep barky cough, in paroxysms; patient to the point of nearly gagging after one coughing episode  Abdominal: She exhibits no distension.  Lymphadenopathy:    She has no cervical adenopathy.  Skin: No rash noted. She is not diaphoretic. No pallor.  Psychiatric: She has a normal mood and affect. Her behavior is normal. Judgment and thought content normal. Her mood appears not anxious. She does not exhibit a depressed mood.   Rapid strep: negative     Assessment & Plan:   Problem  List Items Addressed This Visit      Other   Cough   Relevant Medications   chlorpheniramine-HYDROcodone (TUSSIONEX PENNKINETIC ER) 10-8 MG/5ML SUER   azithromycin (ZITHROMAX) 250 MG tablet   Other Relevant Orders   B pertussis IgG/IgM Ab   CBC with Differential/Platelet   B pertussis IgA Ab   Bordetella Pertussis PCR    Other Visit Diagnoses    Sore throat    -  Primary  rapid strep negative; throat culture pending; symptomatic care   Relevant Orders   POCT rapid strep A (Completed)   Culture, Group A Strep (Completed)       Follow up plan: No Follow-up on file.  An after-visit summary was printed and given to the patient at [facility].  Please see the patient instructions which may contain other information and recommendations beyond what is mentioned above in the assessment and plan.  Meds ordered this encounter  Medications  . simvastatin (ZOCOR) 10 MG tablet    Sig: Take 10 mg by mouth daily.  . chlorpheniramine-HYDROcodone (TUSSIONEX PENNKINETIC ER) 10-8 MG/5ML SUER    Sig: Take 5 mLs by mouth every 12 (twelve) hours as needed for cough.    Dispense:  140 mL    Refill:  0  . azithromycin (ZITHROMAX) 250 MG tablet    Sig: Two pills by mouth daily on day 1, then one daily x 4 more days    Dispense:  6 tablet    Refill:  0    Orders Placed This Encounter  Procedures  . Culture, Group A Strep  . Bordetella Pertussis PCR  . B pertussis IgG/IgM Ab  . CBC with Differential/Platelet  . B pertussis IgA Ab  . POCT rapid strep A   Cautioned about cough medicine

## 2015-10-11 NOTE — Patient Instructions (Addendum)
We'll check for strep throat Start azithromycin today Use the cough syrup if needed to suppress the cough, one teaspoon or 5 ml every twelve hours Do not drink any alcohol or take any anxiety medicine or sleeping pills or pain pills with this cough syrup Out of work the rest of the week, may return to work on Sunday, but you must have finished all of your antibiotics

## 2015-10-12 LAB — MISC LABCORP TEST (SEND OUT): Labcorp test code: 138677

## 2015-10-13 LAB — CULTURE, GROUP A STREP: Organism ID, Bacteria: NORMAL

## 2015-10-16 HISTORY — PX: EYE SURGERY: SHX253

## 2015-10-25 ENCOUNTER — Encounter: Payer: Self-pay | Admitting: Cardiovascular Disease

## 2015-10-25 ENCOUNTER — Ambulatory Visit (INDEPENDENT_AMBULATORY_CARE_PROVIDER_SITE_OTHER): Payer: Medicaid Other | Admitting: Cardiovascular Disease

## 2015-10-25 VITALS — BP 130/78 | Ht 65.5 in | Wt 243.5 lb

## 2015-10-25 DIAGNOSIS — E7800 Pure hypercholesterolemia, unspecified: Secondary | ICD-10-CM

## 2015-10-25 DIAGNOSIS — I10 Essential (primary) hypertension: Secondary | ICD-10-CM | POA: Diagnosis not present

## 2015-10-25 DIAGNOSIS — I2510 Atherosclerotic heart disease of native coronary artery without angina pectoris: Secondary | ICD-10-CM | POA: Diagnosis not present

## 2015-10-25 MED ORDER — METOPROLOL SUCCINATE ER 100 MG PO TB24: 100.0000 mg | ORAL_TABLET | Freq: Every day | ORAL | 1 refills | Status: DC

## 2015-10-25 MED ORDER — SIMVASTATIN 10 MG PO TABS: 10.0000 mg | ORAL_TABLET | Freq: Every day | ORAL | 1 refills | Status: DC

## 2015-10-25 MED ORDER — LOSARTAN POTASSIUM 25 MG PO TABS
25.0000 mg | ORAL_TABLET | Freq: Every day | ORAL | 5 refills | Status: DC
Start: 2015-10-25 — End: 2016-04-12

## 2015-10-25 NOTE — Patient Instructions (Signed)
Medication Instructions:  Your physician has recommended you make the following change in your medication:  STOP taking hydralazine START taking losartan 25mg  once daily    Labwork: BMET and fasting lipid and liver profile in one week. Nothing to eat or drink after midnight the evening before your labs.   Testing/Procedures: none  Follow-Up: Your physician wants you to follow-up in: 6 months with Dr. Fletcher Anon.  You will receive a reminder letter in the mail two months in advance. If you don't receive a letter, please call our office to schedule the follow-up appointment.   Any Other Special Instructions Will Be Listed Below (If Applicable).     If you need a refill on your cardiac medications before your next appointment, please call your pharmacy.

## 2015-10-25 NOTE — Progress Notes (Signed)
Cardiology Office Note   Date:  10/25/2015   ID:  Robin Howard, DOB Feb 10, 1960, MRN [id number]  PCP:  Enid Derry, MD  Cardiologist:   Kathlyn Sacramento, MD   Chief Complaint  Patient presents with  . other    6 month follow up. Meds reviewed by the pt. verbally. "doing well."       History of Present Illness: Robin Howard is a 55 y.o. female who Is here today for a follow-up visit regarding mild nonobstructive coronary artery disease.  She had 2 episodes of chest pain with mildly elevated troponin without evidence of obstructive coronary artery disease. Initial presentation was in April 2016 in the setting of bronchitis. Cardiac catheterization showed mild nonobstructive LAD disease. Ejection fraction was normal. She was diagnosed with breast cancer in July and underwent lumpectomy followed by chemotherapy. She recently finished treatment with Herceptin. Most recent MUGA scan  showed a stable ejection fraction of 56%.   She has been doing well and denies any chest pain or shortness of breath.    Past Medical History:  Diagnosis Date  . Anemia    h/o  . Breast cancer ([place]) 07/2014   invasive mammary carcinoma with mammosite and chemo tx  . Bronchitis 04-2014  . Chronic headache   . Coronary artery disease     small non-ST elevation myocardial infarction in April 2016 in January 2017. Cardiac catheterization in April 2016 showed mild nonobstructive disease affecting the LAD with tortuous vessels. CTA in 2017 showed only 30% disease in the LAD.  Marland Kitchen Detached retina 1997  . Family history of adverse reaction to anesthesia    pts dad has pseudocholinestrace deficieny  . Gastritis   . GERD (gastroesophageal reflux disease)   . HTN (hypertension)   . Hypothyroidism   . Insomnia   . Last menstrual period (LMP) > 10 days ago    LMP 2014  . Major depressive disorder, recurrent ([place])   . Migraines   . Morbid obesity ([place])   . NSTEMI (non-ST elevated myocardial infarction) ([place])  04/16/14   Due to demand- normal Cath 04/19/14  . NSTEMI (non-ST elevated myocardial infarction) ([place]) 01-24-15  . OSA (obstructive sleep apnea)    untreated due to lack of insurance  . Overactive bladder   . Pericarditis 1997  . Pericarditis 1997  . Shortness of breath dyspnea    with exertion only    Past Surgical History:  Procedure Laterality Date  . BACK SURGERY     L4-5  . BREAST BIOPSY Left 07/2014   [facility]  . BREAST CYST ASPIRATION Left 12/13/2014   3:00 position 2 cmfn, fat necrosis per Dr. Dwyane Luo notes  . BREAST LUMPECTOMY WITH SENTINEL LYMPH NODE BIOPSY Left 08/13/2014   Procedure: BREAST LUMPECTOMY WITH SENTINEL LYMPH NODE BIOPSY;  Surgeon: Robert Bellow, MD;  Location: [facility] ORS;  Service: General;  Laterality: Left;  . BREAST MAMMOSITE Left 08/30/2014   Procedure: MAMMOSITE BREAST;  Surgeon: Robert Bellow, MD;  Location: [facility] ORS;  Service: General;  Laterality: Left;  . CESAREAN SECTION    . CORONARY ANGIOPLASTY  April 2016  . FOOT SURGERY    . KNEE SURGERY    . PORTACATH PLACEMENT Right 08/30/2014   Procedure: INSERTION PORT-A-CATH;  Surgeon: Robert Bellow, MD;  Location: [facility] ORS;  Service: General;  Laterality: Right;  . RETINAL DETACHMENT SURGERY Right 1997     Current Outpatient Prescriptions  Medication Sig Dispense Refill  . acetaminophen (TYLENOL) 500  MG tablet Take 1,000 mg by mouth every 6 (six) hours as needed for mild pain or headache.    Marland Kitchen ascorbic acid (VITAMIN C) 250 MG CHEW Chew 250 mg by mouth daily.    Marland Kitchen aspirin EC 81 MG tablet Take 81 mg by mouth daily.    Marland Kitchen azelastine (ASTELIN) 0.1 % nasal spray Place 2 sprays into both nostrils 2 (two) times daily. Use in each nostril as directed 30 mL 12  . CALCIUM PO Take 1 tablet by mouth 2 (two) times daily.     . cholecalciferol (VITAMIN D) 1000 UNITS tablet Take 1,000 Units by mouth daily.    . diphenhydrAMINE (BENADRYL) 25 MG tablet Take 25 mg by mouth every 6 (six) hours as needed for allergies.      Marland Kitchen escitalopram (LEXAPRO) 20 MG tablet TAKE ONE TABLET BY MOUTH ONCE DAILY 30 tablet 5  . hydrALAZINE (APRESOLINE) 25 MG tablet Take 25 mg by mouth 2 (two) times daily.    Marland Kitchen levothyroxine (SYNTHROID, LEVOTHROID) 100 MCG tablet Take 1 tablet (100 mcg total) by mouth daily. 30 tablet 6  . lidocaine-prilocaine (EMLA) cream Apply 1 application topically as needed (prior to accessing port).    . metoprolol succinate (TOPROL-XL) 100 MG 24 hr tablet Take 1 tablet (100 mg total) by mouth daily. Take with or immediately following a meal. 90 tablet 2  . Multiple Vitamin (MULTIVITAMIN WITH MINERALS) TABS tablet Take 1 tablet by mouth daily.    . ranitidine (ZANTAC) 300 MG tablet TAKE ONE TABLET BY MOUTH AT BEDTIME (REPLACES  OMEPRAZOLE) 30 tablet 5  . simvastatin (ZOCOR) 10 MG tablet Take 10 mg by mouth daily.    . vitamin B-12 (CYANOCOBALAMIN) 1000 MCG tablet Take 1,000 mcg by mouth daily.     No current facility-administered medications for this visit.    Facility-Administered Medications Ordered in Other Visits  Medication Dose Route Frequency Provider Last Rate Last Dose  . sodium chloride 0.9 % injection 10 mL  10 mL Intracatheter PRN Lloyd Huger, MD   10 mL at 11/30/14 1205    Allergies:   Reglan [metoclopramide]; Paxil [paroxetine hcl]; Tegaderm ag mesh [silver]; and Ultram [tramadol]    Social History:  The patient  reports that she has never smoked. She has never used smokeless tobacco. She reports that she does not drink alcohol or use drugs.   Family History:  The patient's family history includes Alcohol abuse in her mother; Arrhythmia in her father; Breast cancer (age of onset: 61) in her sister; COPD in her mother; Cancer in her father, maternal uncle, and sister; Congestive Heart Failure in her father; Depression in her father; Diabetes in her other; Heart attack in her father; Heart disease (age of onset: 23) in her paternal grandfather; Heart failure in her father; Hypertension  in her father and mother; Thyroid disease in her mother.    ROS:  Please see the history of present illness.   Otherwise, review of systems are positive for none.   All other systems are reviewed and negative.    PHYSICAL EXAM: VS:  BP 130/78 (BP Location: Right Arm, Patient Position: Sitting, Cuff Size: Large)   Ht 5' 5.5" (1.664 m)   Wt 243 lb 8 oz (110.5 kg)   LMP 08/26/2012   BMI 39.90 kg/m  , BMI Body mass index is 39.9 kg/m. GEN: Well nourished, well developed, in no acute distress  HEENT: normal  Neck: no JVD, carotid bruits, or masses Cardiac: RRR; no  murmurs, rubs, or gallops,no edema  Respiratory:  clear to auscultation bilaterally, normal work of breathing GI: soft, nontender, nondistended, + BS MS: no deformity or atrophy  Skin: warm and dry, no rash Neuro:  Strength and sensation are intact Psych: euthymic mood, full affect   EKG:  EKG is ordered today. The ekg ordered today demonstrates normal sinus rhythm with  anterolateral ST changes suggestive of ischemia.  Recent Labs: 08/29/2015: TSH 0.89 09/22/2015: Magnesium 2.0 10/04/2015: ALT 15; BUN 18; Creatinine, Ser 1.07; Hemoglobin 10.6; Platelets 207; Potassium 4.0; Sodium 136    Lipid Panel    Component Value Date/Time   CHOL 132 04/17/2014 0444   TRIG 115 04/17/2014 0444   HDL 37 (L) 04/17/2014 0444   VLDL 23 04/17/2014 0444   LDLCALC 72 04/17/2014 0444      Wt Readings from Last 3 Encounters:  10/25/15 243 lb 8 oz (110.5 kg)  10/11/15 244 lb (110.7 kg)  10/04/15 242 lb 15.2 oz (110.2 kg)         ASSESSMENT AND PLAN:  1.  Coronary atherosclerosis without obstructive coronary artery disease: Currently with no anginal symptoms. Recommend continuing aggressive medical therapy. Continue low-dose aspirin.  2. Essential hypertension: Blood pressure is controlled. However, she does not like taking hydralazine twice daily. I elected to switch to losartan 25 mg once daily. Check basic metabolic profile in  one week.  3. Hyperlipidemia: She was placed on simvastatin due to coronary atherosclerosis . I requested fasting lipid and liver profile.   Disposition:   FU with me in 6 months  Signed,  Kathlyn Sacramento, MD  10/25/2015 3:03 PM    [facility]

## 2015-10-26 ENCOUNTER — Telehealth: Payer: Self-pay | Admitting: *Deleted

## 2015-10-26 NOTE — Telephone Encounter (Signed)
Pt requiring PA for Losartan 25 mg tablet. Spoke with Rosendo Gros Allen trackz pt must have failed or tried one preferred ace inhibitor.  She will forward request to pharmacist and continue with PA considering pt has failed and tried other BP medications. Awaiting response with 24 hrs  [id number]

## 2015-10-27 NOTE — Telephone Encounter (Signed)
Pt has been approved Losartan 25 mg 10/26/15-10/20/16.

## 2015-11-01 ENCOUNTER — Other Ambulatory Visit: Payer: Medicaid Other

## 2015-11-04 ENCOUNTER — Other Ambulatory Visit (INDEPENDENT_AMBULATORY_CARE_PROVIDER_SITE_OTHER): Payer: Medicaid Other

## 2015-11-04 DIAGNOSIS — I2510 Atherosclerotic heart disease of native coronary artery without angina pectoris: Secondary | ICD-10-CM

## 2015-11-04 DIAGNOSIS — I10 Essential (primary) hypertension: Secondary | ICD-10-CM

## 2015-11-05 LAB — BASIC METABOLIC PANEL
BUN / CREAT RATIO: 18 (ref 9–23)
BUN: 19 mg/dL (ref 6–24)
CHLORIDE: 104 mmol/L (ref 96–106)
CO2: 25 mmol/L (ref 18–29)
Calcium: 9.3 mg/dL (ref 8.7–10.2)
Creatinine, Ser: 1.03 mg/dL — ABNORMAL HIGH (ref 0.57–1.00)
GFR calc Af Amer: 71 mL/min/{1.73_m2} (ref >59)
GFR calc non Af Amer: 62 mL/min/{1.73_m2} (ref >59)
GLUCOSE: 89 mg/dL (ref 65–99)
Potassium: 4.5 mmol/L (ref 3.5–5.2)
SODIUM: 145 mmol/L — AB (ref 134–144)

## 2015-11-05 LAB — LIPID PANEL
CHOL/HDL RATIO: 3.4 ratio (ref 0.0–4.4)
Cholesterol, Total: 148 mg/dL (ref 100–199)
HDL: 43 mg/dL (ref >39)
LDL Calculated: 75 mg/dL (ref 0–99)
Triglycerides: 151 mg/dL — ABNORMAL HIGH (ref 0–149)
VLDL CHOLESTEROL CAL: 30 mg/dL (ref 5–40)

## 2015-11-05 LAB — HEPATIC FUNCTION PANEL
ALBUMIN: 4 g/dL (ref 3.5–5.5)
ALT: 14 IU/L (ref 0–32)
AST: 24 IU/L (ref 0–40)
Alkaline Phosphatase: 91 IU/L (ref 39–117)
BILIRUBIN TOTAL: 0.9 mg/dL (ref 0.0–1.2)
Bilirubin, Direct: 0.2 mg/dL (ref 0.00–0.40)
TOTAL PROTEIN: 6.4 g/dL (ref 6.0–8.5)

## 2015-11-15 ENCOUNTER — Inpatient Hospital Stay: Payer: Medicaid Other | Attending: Oncology

## 2015-11-15 DIAGNOSIS — Z95828 Presence of other vascular implants and grafts: Secondary | ICD-10-CM

## 2015-11-15 DIAGNOSIS — Z171 Estrogen receptor negative status [ER-]: Secondary | ICD-10-CM | POA: Diagnosis not present

## 2015-11-15 DIAGNOSIS — Z452 Encounter for adjustment and management of vascular access device: Secondary | ICD-10-CM | POA: Diagnosis not present

## 2015-11-15 DIAGNOSIS — C50512 Malignant neoplasm of lower-outer quadrant of left female breast: Secondary | ICD-10-CM | POA: Insufficient documentation

## 2015-11-15 MED ORDER — SODIUM CHLORIDE 0.9% FLUSH
10.0000 mL | INTRAVENOUS | Status: DC | PRN
  Administered 2015-11-15: 10 mL via INTRAVENOUS
  Filled 2015-11-15: qty 10

## 2015-11-15 MED ORDER — HEPARIN SOD (PORK) LOCK FLUSH 100 UNIT/ML IV SOLN
500.0000 [IU] | Freq: Once | INTRAVENOUS | Status: AC
  Administered 2015-11-15: 500 [IU] via INTRAVENOUS

## 2015-11-23 ENCOUNTER — Ambulatory Visit: Payer: Medicaid Other | Admitting: Radiation Oncology

## 2015-12-25 NOTE — Progress Notes (Signed)
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 She currently feels well and is asymptomatic.  She has no neurologic complaints. She denies any recent fevers. She has no chest pain or shortness of breath. She denies any nausea, vomiting, constipation, or diarrhea. She has no urinary complaints. Patient offers no specific complaints today.  REVIEW OF SYSTEMS:   Review of Systems  Constitutional: Negative.  Negative for fever and malaise/fatigue.  HENT: Negative for congestion and sore throat.   Eyes: Negative for blurred vision and pain.  Respiratory: Negative for cough.   Cardiovascular: Negative.  Negative for chest pain.  Gastrointestinal: Negative for abdominal pain, diarrhea and nausea.  Genitourinary: Negative.   Musculoskeletal: Positive for joint pain. Negative for back pain.  Neurological: Negative for weakness and headaches.  Psychiatric/Behavioral: Negative.     As per HPI. Otherwise, a complete review of systems is negative.  PAST MEDICAL HISTORY: Past Medical History:  Diagnosis Date  . Anemia    h/o  . Breast cancer ([place]) 07/2014   invasive mammary carcinoma with mammosite and chemo tx  . Bronchitis 04-2014  . Chronic headache   . Coronary artery disease     small non-ST elevation myocardial  infarction in April 2016 in January 2017. Cardiac catheterization in April 2016 showed mild nonobstructive disease affecting the LAD with tortuous vessels. CTA in 2017 showed only 30% disease in the LAD.  Marland Kitchen Detached retina 1997  . Family history of adverse reaction to anesthesia    pts dad has pseudocholinestrace deficieny  . Gastritis   . GERD (gastroesophageal reflux disease)   . HTN (hypertension)   . Hypothyroidism   . Insomnia   . Last menstrual period (LMP) > 10 days ago    LMP 2014  . Major depressive disorder, recurrent ([place])   . Migraines   . Morbid obesity ([place])   . NSTEMI (non-ST elevated myocardial infarction) ([place]) 04/16/14   Due to demand- normal Cath 04/19/14  . NSTEMI (non-ST elevated myocardial infarction) ([place]) 01-24-15  . OSA (obstructive sleep apnea)    untreated due to lack of insurance  . Overactive bladder   . Pericarditis 1997  . Pericarditis 1997  . Shortness of breath dyspnea    with exertion only    PAST SURGICAL HISTORY: Past Surgical History:  Procedure Laterality Date  . BACK SURGERY     L4-5  . BREAST [facility]OPSY [facility] 07[facility]2014   [facility]  . BREAST CYST ASPIRATION Left 12/13/2014   3:00 position 2 cmfn, fat necrosis per Dr. Dwyane Luo notes  . BREAST LUMPECTOMY WITH SENTINEL LYMPH NODE BIOPSY Left 08/13/2014   Procedure: BREAST LUMPECTOMY WITH SENTINEL LYMPH NODE BIOPSY;  Surgeon: Robert Bellow, MD;  Location: [facility] ORS;  Service: General;  Laterality: Left;  . BREAST MAMMOSITE Left 08/30/2014   Procedure: MAMMOSITE BREAST;  Surgeon: Robert Bellow, MD;  Location: [facility] ORS;  Service:  General;  Laterality: Left;  . CESAREAN SECTION    . CORONARY ANGIOPLASTY  April 2016  . FOOT SURGERY    . KNEE SURGERY    . PORTACATH PLACEMENT Right 08/30/2014   Procedure: INSERTION PORT-A-CATH;  Surgeon: Robert Bellow, MD;  Location: [facility] ORS;  Service: General;  Laterality: Right;  . RETINAL DETACHMENT SURGERY Right 1997    FAMILY HISTORY Family History  Problem  Relation Age of Onset  . Alcohol abuse Mother   . Thyroid disease Mother   . Hypertension Mother   . COPD Mother   . Cancer Father     liposarcoma  . Congestive Heart Failure Father     V Tach  . Depression Father   . Hypertension Father   . Heart attack Father   . Arrhythmia Father   . Heart failure Father   . Cancer Maternal Uncle     Pancreatic cancer  . Heart disease Paternal Grandfather 30    MI  . Diabetes Other   . Cancer Sister     breast  . Breast cancer Sister 104       ADVANCED DIRECTIVES:    HEALTH MAINTENANCE: Social History  Substance Use Topics  . Smoking status: Never Smoker  . Smokeless tobacco: Never Used  . Alcohol use No    Allergies  Allergen Reactions  . Reglan [Metoclopramide] Shortness Of Breath and Other (See Comments)    This medication caused a dystonic reaction.    Marland Kitchen Paxil [Paroxetine Hcl] Other (See Comments)    Reaction:  Blurred vision  . Tegaderm Ag Mesh [Silver] Rash  . Ultram [Tramadol] Itching and Rash    Current Outpatient Prescriptions  Medication Sig Dispense Refill  . acetaminophen (TYLENOL) 500 MG tablet Take 1,000 mg by mouth every 6 (six) hours as needed for mild pain or headache.    Marland Kitchen ascorbic acid (VITAMIN C) 250 MG CHEW Chew 250 mg by mouth daily.    Marland Kitchen aspirin EC 81 MG tablet Take 81 mg by mouth daily.    Marland Kitchen azelastine (ASTELIN) 0.1 % nasal spray Place 2 sprays into both nostrils 2 (two) times daily. Use in each nostril as directed 30 mL 12  . CALCIUM PO Take 1 tablet by mouth 2 (two) times daily.     . cholecalciferol (VITAMIN D) 1000 UNITS tablet Take 1,000 Units by mouth daily.    . diphenhydrAMINE (BENADRYL) 25 MG tablet Take 25 mg by mouth every 6 (six) hours as needed for allergies.     Marland Kitchen escitalopram (LEXAPRO) 20 MG tablet TAKE ONE TABLET BY MOUTH ONCE DAILY 30 tablet 5  . levothyroxine (SYNTHROID, LEVOTHROID) 100 MCG tablet Take 1 tablet (100 mcg total) by mouth daily. 30 tablet 6  . lidocaine-prilocaine  (EMLA) cream Apply 1 application topically as needed (prior to accessing port).    . loratadine (CLARITIN) 10 MG tablet Take 10 mg by mouth daily.    Marland Kitchen losartan (COZAAR) 25 MG tablet Take 1 tablet (25 mg total) by mouth daily. 25 tablet 5  . metoprolol succinate (TOPROL-XL) 100 MG 24 hr tablet Take 1 tablet (100 mg total) by mouth daily. Take with or immediately following a meal. 90 tablet 1  . Multiple Vitamin (MULTIVITAMIN WITH MINERALS) TABS tablet Take 1 tablet by mouth daily.    . ranitidine (ZANTAC) 300 MG tablet TAKE ONE TABLET BY MOUTH AT BEDTIME (REPLACES  OMEPRAZOLE) 30 tablet 5  . simvastatin (ZOCOR) 10 MG tablet Take 1 tablet (10 mg  total) by mouth daily. 90 tablet 1  . vitamin B-12 (CYANOCOBALAMIN) 1000 MCG tablet Take 1,000 mcg by mouth daily.     No current facility-administered medications for this visit.    Facility-Administered Medications Ordered in Other Visits  Medication Dose Route Frequency Provider Last Rate Last Dose  . sodium chloride 0.9 % injection 10 mL  10 mL Intracatheter PRN Lloyd Huger, MD   10 mL at 11/30/14 1205    OBJECTIVE: Vitals:   12/27/15 1427  BP: 111/73  Pulse: (!) 51  Resp: 18  Temp: 98.1 F (36.7 C)     Body mass index is 38.96 kg/m.    ECOG FS:0 - Asymptomatic  General: Well-developed, well-nourished, no acute distress. Eyes: Pink conjunctiva, anicteric sclera. Breasts: Exam deferred today. Lungs: Clear to auscultation bilaterally. Heart: Regular rate and rhythm. No rubs, murmurs, or gallops. Abdomen: Soft, nontender, nondistended. No organomegaly noted, normoactive bowel sounds. Musculoskeletal: No edema, cyanosis, or clubbing. Neuro: Alert, answering all questions appropriately. Cranial nerves grossly intact. Skin: No rashes or petechiae noted. Psych: Normal affect.   LAB RESULTS:  Lab Results  Component Value Date   NA 135 12/27/2015   K 4.6 12/27/2015   CL 101 12/27/2015   CO2 27 12/27/2015   GLUCOSE 111 (H)  12/27/2015   BUN 17 12/27/2015   CREATININE 1.10 (H) 12/27/2015   CALCIUM 8.7 (L) 12/27/2015   PROT 6.7 12/27/2015   ALBUMIN 4.0 12/27/2015   AST 25 12/27/2015   ALT 17 12/27/2015   ALKPHOS 61 12/27/2015   BILITOT 1.2 12/27/2015   GFRNONAA 56 (L) 12/27/2015   GFRAA >60 12/27/2015    Lab Results  Component Value Date   WBC 7.1 12/27/2015   NEUTROABS 4.3 12/27/2015   HGB 11.3 (L) 12/27/2015   HCT 33.4 (L) 12/27/2015   MCV 85.8 12/27/2015   PLT 214 12/27/2015     STUDIES: No results found.  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  PLAN:    1. 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 Patient's MUGA scan from July 18, 2015 reported an EF of 53%.  Given patient's persistent side effects, chemotherapy was discontinued altogether after 3 cycles. Patient completed her year-long maintenance Herceptin on October 04, 2015. Her most recent mammogram on August 16, 2015 was reported as BI-RADS 2, repeat in August 2018.  Return to clinic in 6 months for routine evaluation. 2. Anemia: Mild, monitor. 3. Headaches: Patient does not complain of this today.  MRI April 29, 2015 was normal.  4. Right shoulder pain: Likely musculoskeletal in nature. Treatment per primary care.  5. Nausea: Resolved. 6. Retinal tear: Continue treatment per ophthalmology.  Patient expressed understanding and was in agreement with this plan. She also understands that She can call clinic at any time with any questions, concerns, or complaints.   Breast cancer, female   Staging form: Breast, AJCC 7th Edition     Pathologic stage from 08/17/2014: Stage IA (T1c, N0, cM0) - Signed by Lloyd Huger, MD on 08/17/2014  Lloyd Huger, MD   12/30/2015 4:13 PM

## 2015-12-27 ENCOUNTER — Inpatient Hospital Stay (HOSPITAL_BASED_OUTPATIENT_CLINIC_OR_DEPARTMENT_OTHER): Payer: Medicaid Other | Admitting: Oncology

## 2015-12-27 ENCOUNTER — Ambulatory Visit
Admission: RE | Admit: 2015-12-27 | Discharge: 2015-12-27 | Disposition: A | Discharge status: Home / Self Care | Payer: Medicaid Other | Source: Ambulatory Visit | Attending: Radiation Oncology | Admitting: Radiation Oncology

## 2015-12-27 ENCOUNTER — Inpatient Hospital Stay: Payer: Medicaid Other | Attending: Oncology

## 2015-12-27 ENCOUNTER — Inpatient Hospital Stay: Payer: Medicaid Other

## 2015-12-27 VITALS — BP 111/73 | HR 51 | Temp 98.1°F | Resp 18 | Wt 237.8 lb

## 2015-12-27 VITALS — BP 117/79 | HR 58 | Temp 98.3°F | Wt 238.5 lb

## 2015-12-27 DIAGNOSIS — Z171 Estrogen receptor negative status [ER-]: Principal | ICD-10-CM

## 2015-12-27 DIAGNOSIS — Z170 Estrogen receptor positive status [ER+]: Secondary | ICD-10-CM | POA: Insufficient documentation

## 2015-12-27 DIAGNOSIS — R0602 Shortness of breath: Secondary | ICD-10-CM | POA: Diagnosis not present

## 2015-12-27 DIAGNOSIS — Z95828 Presence of other vascular implants and grafts: Secondary | ICD-10-CM

## 2015-12-27 DIAGNOSIS — Z923 Personal history of irradiation: Secondary | ICD-10-CM | POA: Diagnosis not present

## 2015-12-27 DIAGNOSIS — F419 Anxiety disorder, unspecified: Secondary | ICD-10-CM | POA: Diagnosis not present

## 2015-12-27 DIAGNOSIS — I252 Old myocardial infarction: Secondary | ICD-10-CM | POA: Diagnosis not present

## 2015-12-27 DIAGNOSIS — C50512 Malignant neoplasm of lower-outer quadrant of left female breast: Secondary | ICD-10-CM | POA: Diagnosis present

## 2015-12-27 DIAGNOSIS — I2510 Atherosclerotic heart disease of native coronary artery without angina pectoris: Secondary | ICD-10-CM | POA: Diagnosis not present

## 2015-12-27 DIAGNOSIS — I10 Essential (primary) hypertension: Secondary | ICD-10-CM | POA: Diagnosis not present

## 2015-12-27 DIAGNOSIS — E039 Hypothyroidism, unspecified: Secondary | ICD-10-CM | POA: Insufficient documentation

## 2015-12-27 DIAGNOSIS — M2550 Pain in unspecified joint: Secondary | ICD-10-CM | POA: Diagnosis not present

## 2015-12-27 DIAGNOSIS — G4700 Insomnia, unspecified: Secondary | ICD-10-CM | POA: Diagnosis not present

## 2015-12-27 DIAGNOSIS — Z803 Family history of malignant neoplasm of breast: Secondary | ICD-10-CM | POA: Insufficient documentation

## 2015-12-27 DIAGNOSIS — Z809 Family history of malignant neoplasm, unspecified: Secondary | ICD-10-CM | POA: Insufficient documentation

## 2015-12-27 DIAGNOSIS — Z800 Family history of malignant neoplasm of digestive organs: Secondary | ICD-10-CM | POA: Insufficient documentation

## 2015-12-27 DIAGNOSIS — Z9221 Personal history of antineoplastic chemotherapy: Secondary | ICD-10-CM | POA: Diagnosis not present

## 2015-12-27 DIAGNOSIS — E669 Obesity, unspecified: Secondary | ICD-10-CM | POA: Insufficient documentation

## 2015-12-27 DIAGNOSIS — M25511 Pain in right shoulder: Secondary | ICD-10-CM | POA: Insufficient documentation

## 2015-12-27 DIAGNOSIS — Z7982 Long term (current) use of aspirin: Secondary | ICD-10-CM | POA: Insufficient documentation

## 2015-12-27 DIAGNOSIS — F329 Major depressive disorder, single episode, unspecified: Secondary | ICD-10-CM | POA: Diagnosis not present

## 2015-12-27 DIAGNOSIS — Z8719 Personal history of other diseases of the digestive system: Secondary | ICD-10-CM | POA: Diagnosis not present

## 2015-12-27 DIAGNOSIS — R590 Localized enlarged lymph nodes: Secondary | ICD-10-CM | POA: Diagnosis not present

## 2015-12-27 DIAGNOSIS — K219 Gastro-esophageal reflux disease without esophagitis: Secondary | ICD-10-CM | POA: Diagnosis not present

## 2015-12-27 DIAGNOSIS — C50411 Malignant neoplasm of upper-outer quadrant of right female breast: Secondary | ICD-10-CM | POA: Diagnosis not present

## 2015-12-27 DIAGNOSIS — N3281 Overactive bladder: Secondary | ICD-10-CM | POA: Insufficient documentation

## 2015-12-27 DIAGNOSIS — Z79899 Other long term (current) drug therapy: Secondary | ICD-10-CM | POA: Insufficient documentation

## 2015-12-27 DIAGNOSIS — D649 Anemia, unspecified: Secondary | ICD-10-CM | POA: Insufficient documentation

## 2015-12-27 DIAGNOSIS — Z8669 Personal history of other diseases of the nervous system and sense organs: Secondary | ICD-10-CM | POA: Diagnosis not present

## 2015-12-27 DIAGNOSIS — Z808 Family history of malignant neoplasm of other organs or systems: Secondary | ICD-10-CM

## 2015-12-27 DIAGNOSIS — Z853 Personal history of malignant neoplasm of breast: Secondary | ICD-10-CM | POA: Insufficient documentation

## 2015-12-27 DIAGNOSIS — G4730 Sleep apnea, unspecified: Secondary | ICD-10-CM | POA: Insufficient documentation

## 2015-12-27 LAB — CBC WITH DIFFERENTIAL/PLATELET
BASOS ABS: 0.1 10*3/uL (ref 0–0.1)
Basophils Relative: 1 %
Eosinophils Absolute: 0.3 10*3/uL (ref 0–0.7)
Eosinophils Relative: 4 %
HEMATOCRIT: 33.4 % — AB (ref 35.0–47.0)
Hemoglobin: 11.3 g/dL — ABNORMAL LOW (ref 12.0–16.0)
LYMPHS PCT: 24 %
Lymphs Abs: 1.7 10*3/uL (ref 1.0–3.6)
MCH: 29 pg (ref 26.0–34.0)
MCHC: 33.8 g/dL (ref 32.0–36.0)
MCV: 85.8 fL (ref 80.0–100.0)
MONO ABS: 0.8 10*3/uL (ref 0.2–0.9)
MONOS PCT: 11 %
NEUTROS ABS: 4.3 10*3/uL (ref 1.4–6.5)
Neutrophils Relative %: 60 %
Platelets: 214 10*3/uL (ref 150–440)
RBC: 3.89 MIL/uL (ref 3.80–5.20)
RDW: 15.5 % — AB (ref 11.5–14.5)
WBC: 7.1 10*3/uL (ref 3.6–11.0)

## 2015-12-27 LAB — COMPREHENSIVE METABOLIC PANEL
ALT: 17 U/L (ref 14–54)
ANION GAP: 7 (ref 5–15)
AST: 25 U/L (ref 15–41)
Albumin: 4 g/dL (ref 3.5–5.0)
Alkaline Phosphatase: 61 U/L (ref 38–126)
BILIRUBIN TOTAL: 1.2 mg/dL (ref 0.3–1.2)
BUN: 17 mg/dL (ref 6–20)
CO2: 27 mmol/L (ref 22–32)
Calcium: 8.7 mg/dL — ABNORMAL LOW (ref 8.9–10.3)
Chloride: 101 mmol/L (ref 101–111)
Creatinine, Ser: 1.1 mg/dL — ABNORMAL HIGH (ref 0.44–1.00)
GFR calc Af Amer: >60 mL/min (ref >60)
GFR, EST NON AFRICAN AMERICAN: 56 mL/min — AB (ref >60)
Glucose, Bld: 111 mg/dL — ABNORMAL HIGH (ref 65–99)
POTASSIUM: 4.6 mmol/L (ref 3.5–5.1)
Sodium: 135 mmol/L (ref 135–145)
TOTAL PROTEIN: 6.7 g/dL (ref 6.5–8.1)

## 2015-12-27 MED ORDER — HEPARIN SOD (PORK) LOCK FLUSH 100 UNIT/ML IV SOLN
500.0000 [IU] | Freq: Once | INTRAVENOUS | Status: AC
  Administered 2015-12-27: 500 [IU] via INTRAVENOUS

## 2015-12-27 MED ORDER — SODIUM CHLORIDE 0.9% FLUSH
10.0000 mL | INTRAVENOUS | Status: DC | PRN
  Administered 2015-12-27: 10 mL via INTRAVENOUS
  Filled 2015-12-27: qty 10

## 2015-12-27 NOTE — Progress Notes (Signed)
Survivorship Care Plan visit completed.  Treatment summary reviewed and given to patient.  ASCO answers booklet reviewed and given to patient.  CARE program and Cancer Transitions discussed with patient along with other resources cancer center offers to patients and caregivers.  Patient verbalized understanding.    

## 2015-12-27 NOTE — Progress Notes (Signed)
Offers no complaints. Feeling well. Had survivorship visit today.

## 2015-12-27 NOTE — Progress Notes (Signed)
Radiation Oncology Follow up Note  Name: Robin Howard   Date:   12/27/2015 MRN:  [id number] DOB: April 07, 1960    This 55 y.o. female presents to the clinic today for 1 year follow-up status post accelerated partial breast radiation. To her left breast for ER/PR negative HER-2/neu overexpressed invasive mammary carcinoma  REFERRING PROVIDER: Robert Bellow, MD  HPI: Patient is a 55 year old female now out 1 year having completed accelerated partial breast radiation to her left breast for a stage I ER/PR negative HER-2/neu overexpressed invasive carcinoma status post chemotherapy as well as Herceptin therapy. Seen today in routine follow-up she is doing well she states she still has some thickening in the lumpectomy site which we would expect from accelerated partial breast radiation.Marland Kitchen Her follow-up mammograms performed in August were fine showing benign breast with 1 year follow-up recommended. She specifically denies breast tenderness cough or bone pain.  COMPLICATIONS OF TREATMENT: none  FOLLOW UP COMPLIANCE: keeps appointments   PHYSICAL EXAM:  BP 117/79   Pulse (!) 58   Temp 98.3 F (36.8 C)   Wt 238 lb 8.6 oz (108.2 kg)   LMP 08/26/2012   BMI 39.09 kg/m  Lungs are clear to A&P cardiac examination essentially unremarkable with regular rate and rhythm. No dominant mass or nodularity is noted in either breast in 2 positions examined. Incision is well-healed. No axillary or supraclavicular adenopathy is appreciated. Cosmetic result is excellent. Still some slight thickening in the lumpectomy site which we would expect from scarring due to accelerated partial breast radiation. Well-developed well-nourished patient in NAD. HEENT reveals PERLA, EOMI, discs not visualized.  Oral cavity is clear. No oral mucosal lesions are identified. Neck is clear without evidence of cervical or supraclavicular adenopathy. Lungs are clear to A&P. Cardiac examination is essentially unremarkable with regular  rate and rhythm without murmur rub or thrill. Abdomen is benign with no organomegaly or masses noted. Motor sensory and DTR levels are equal and symmetric in the upper and lower extremities. Cranial nerves II through XII are grossly intact. Proprioception is intact. No peripheral adenopathy or edema is identified. No motor or sensory levels are noted. Crude visual fields are within normal range.  RADIOLOGY RESULTS: Prior mammograms are reviewed and compatible with the above-stated findings  PLAN: Present time patient is doing well with no evidence of disease. I've asked to see her back in 1 year for follow-up. She is not on antiestrogen therapy based on the ER/PR negative nature of her disease. She has completed all Herceptin. Patient knows to call sooner with any concerns. She is done extremely well.  I would like to take this opportunity to thank you for allowing me to participate in the care of your patient.Armstead Peaks., MD

## 2016-01-11 ENCOUNTER — Other Ambulatory Visit: Payer: Self-pay | Admitting: Family Medicine

## 2016-01-11 DIAGNOSIS — E039 Hypothyroidism, unspecified: Secondary | ICD-10-CM

## 2016-01-11 MED ORDER — LEVOTHYROXINE SODIUM 100 MCG PO TABS: 100.0000 ug | ORAL_TABLET | Freq: Every day | ORAL | 1 refills | Status: DC

## 2016-01-11 NOTE — Telephone Encounter (Signed)
Medication needs to be reordered

## 2016-01-11 NOTE — Telephone Encounter (Signed)
Changing manufacturer of levothyroxine 100 mcg Okay to switch, but please call patient and let her know that we'll want to recheck her TSH six weeks after she starts the new pills Thank you

## 2016-01-11 NOTE — Assessment & Plan Note (Signed)
Check tsh on new manufacturer

## 2016-01-11 NOTE — Telephone Encounter (Signed)
30+1 on the fax to pharmacy; thank you

## 2016-02-05 ENCOUNTER — Other Ambulatory Visit: Payer: Self-pay | Admitting: Family Medicine

## 2016-02-07 ENCOUNTER — Inpatient Hospital Stay: Payer: Medicaid Other | Attending: Oncology

## 2016-02-07 DIAGNOSIS — Z95828 Presence of other vascular implants and grafts: Secondary | ICD-10-CM

## 2016-02-07 DIAGNOSIS — Z452 Encounter for adjustment and management of vascular access device: Secondary | ICD-10-CM | POA: Insufficient documentation

## 2016-02-07 DIAGNOSIS — Z853 Personal history of malignant neoplasm of breast: Secondary | ICD-10-CM | POA: Diagnosis not present

## 2016-02-07 MED ORDER — HEPARIN SOD (PORK) LOCK FLUSH 100 UNIT/ML IV SOLN
500.0000 [IU] | Freq: Once | INTRAVENOUS | Status: AC
  Administered 2016-02-07: 500 [IU] via INTRAVENOUS

## 2016-02-07 MED ORDER — SODIUM CHLORIDE 0.9% FLUSH
10.0000 mL | INTRAVENOUS | Status: DC | PRN
  Administered 2016-02-07: 10 mL via INTRAVENOUS
  Filled 2016-02-07: qty 10

## 2016-02-20 ENCOUNTER — Ambulatory Visit: Payer: Medicaid Other | Admitting: Family Medicine

## 2016-02-21 ENCOUNTER — Other Ambulatory Visit: Payer: Self-pay | Admitting: Family Medicine

## 2016-02-28 ENCOUNTER — Encounter: Payer: Self-pay | Admitting: Family Medicine

## 2016-02-28 ENCOUNTER — Ambulatory Visit (INDEPENDENT_AMBULATORY_CARE_PROVIDER_SITE_OTHER): Payer: Medicaid Other | Admitting: Family Medicine

## 2016-02-28 VITALS — BP 116/64 | HR 74 | Temp 99.4°F | Resp 14 | Wt 233.0 lb

## 2016-02-28 DIAGNOSIS — C50512 Malignant neoplasm of lower-outer quadrant of left female breast: Secondary | ICD-10-CM | POA: Diagnosis not present

## 2016-02-28 DIAGNOSIS — M791 Myalgia, unspecified site: Secondary | ICD-10-CM

## 2016-02-28 DIAGNOSIS — Z23 Encounter for immunization: Secondary | ICD-10-CM

## 2016-02-28 DIAGNOSIS — Z170 Estrogen receptor positive status [ER+]: Secondary | ICD-10-CM | POA: Diagnosis not present

## 2016-02-28 DIAGNOSIS — E669 Obesity, unspecified: Secondary | ICD-10-CM | POA: Diagnosis not present

## 2016-02-28 DIAGNOSIS — F330 Major depressive disorder, recurrent, mild: Secondary | ICD-10-CM | POA: Diagnosis not present

## 2016-02-28 DIAGNOSIS — E039 Hypothyroidism, unspecified: Secondary | ICD-10-CM | POA: Diagnosis not present

## 2016-02-28 MED ORDER — DULOXETINE HCL 30 MG PO CPEP: ORAL_CAPSULE | ORAL | 0 refills | Status: DC

## 2016-02-28 NOTE — Patient Instructions (Signed)
Taper off of the Lexapro (escitalopram) for 10 days, just half of a pill, then stop Start the Cymbalta and take 30 mg daily for 10 days, then 60 mg daily Call me if any problems

## 2016-02-28 NOTE — Progress Notes (Signed)
BP 116/64   Pulse 74   Temp 99.4 F (37.4 C) (Oral)   Resp 14   Wt 233 lb (105.7 kg)   LMP 08/26/2012   SpO2 96%   BMI 38.18 kg/m    Subjective:    Patient ID: Robin Howard, female    DOB: [birth year], 56 y.o.   MRN: [id number]  HPI: Robin Howard is a 56 y.o. female  Chief Complaint  Patient presents with  . Medication Refill   She sees Dr. Marlyn Corporal tomorrow about breast cancer follow-up; she mentioned how it's hard to not go there with every little hurt and ache, is her cancer back  Her mood could be better; not sleeping well; either sleeping too much or sleeping not enough; no thoughts of hurting herself or others; just gets up and goes to work; no time for counseling;   aches and pains; neck is hurting and giving her some sharp pains when looking up or turning  She has hypothyroidism and had to change generics; a little over a month; working hard to lose weight; bowels are moving okay  Last B12 and vitamin D were fine, checked Sept 2017 Taking supplements of each now   Depression screen [facility] 2/9 02/28/2016 12/27/2015 09/22/2015 09/22/2015 07/15/2014  Decreased Interest - 0 1 0 3  Down, Depressed, Hopeless 1 0 1 0 1  PHQ - 2 Score 1 0 2 0 4  Altered sleeping - - 0 - 3  Tired, decreased energy - - 1 - 3  Change in appetite - - 0 - 1  Feeling bad or failure about yourself  - - 1 - 1  Trouble concentrating - - 1 - 2  Moving slowly or fidgety/restless - - 0 - 1  Suicidal thoughts - - 0 - 1  PHQ-9 Score - - 5 - 16  Difficult doing work/chores - - Somewhat difficult - -   Relevant past medical, surgical, family and social history reviewed Past Medical History:  Diagnosis Date  . Anemia    h/o  . Breast cancer ([place]) 07/2014   T1c,N0, ER/ PR negative, Her 2 neu positive. Wide excision, SLN, Mammosite, adjuvant chemotherapy.   . Bronchitis 04-2014  . Chronic headache   . Coronary artery disease     small non-ST elevation myocardial infarction in April 2016 in January 2017.  Cardiac catheterization in April 2016 showed mild nonobstructive disease affecting the LAD with tortuous vessels. CTA in 2017 showed only 30% disease in the LAD.  Marland Kitchen Detached retina 1997  . Family history of adverse reaction to anesthesia    pts dad has pseudocholinestrace deficieny  . Gastritis   . GERD (gastroesophageal reflux disease)   . HTN (hypertension)   . Hypothyroidism   . Insomnia   . Last menstrual period (LMP) > 10 days ago    LMP 2014  . Major depressive disorder, recurrent ([place])   . Migraines   . Morbid obesity ([place])   . NSTEMI (non-ST elevated myocardial infarction) ([place]) 04/16/14   Due to demand- normal Cath 04/19/14  . NSTEMI (non-ST elevated myocardial infarction) ([place]) 01-24-15  . Obesity (BMI 35.0-39.9 without comorbidity) 03/04/2016  . OSA (obstructive sleep apnea)    untreated due to lack of insurance  . Overactive bladder   . Pericarditis 1997  . Pericarditis 1997  . Shortness of breath dyspnea    with exertion only   Past Surgical History:  Procedure Laterality Date  . BACK SURGERY  L4-5  . BREAST [facility]OPSY [facility] 07[facility]2014   [facility]  . BREAST CYST ASPIRATION Left 12/13/2014   3:00 position 2 cmfn, fat necrosis per Dr. Dwyane Luo notes  . BREAST LUMPECTOMY WITH SENTINEL LYMPH NODE BIOPSY Left 08/13/2014   Procedure: BREAST LUMPECTOMY WITH SENTINEL LYMPH NODE BIOPSY;  Surgeon: Robert Bellow, MD;  Location: [facility] ORS;  Service: General;  Laterality: Left;  . BREAST MAMMOSITE Left 08/30/2014   Procedure: MAMMOSITE BREAST;  Surgeon: Robert Bellow, MD;  Location: [facility] ORS;  Service: General;  Laterality: Left;  . CESAREAN SECTION    . CORONARY ANGIOPLASTY  April 2016  . EYE SURGERY Left 10/16/2015   retina repair  . FOOT SURGERY    . KNEE SURGERY    . PORTACATH PLACEMENT Right 08/30/2014   Procedure: INSERTION PORT-A-CATH;  Surgeon: Robert Bellow, MD;  Location: [facility] ORS;  Service: General;  Laterality: Right;  . RETINAL DETACHMENT SURGERY Right 1997    Family History  Problem Relation Age of Onset  . Alcohol abuse Mother   . Thyroid disease Mother   . Hypertension Mother   . COPD Mother   . Cancer Father     liposarcoma  . Congestive Heart Failure Father     V Tach  . Depression Father   . Hypertension Father   . Heart attack Father   . Arrhythmia Father   . Heart failure Father   . Cancer Maternal Uncle     Pancreatic cancer  . Heart disease Paternal Grandfather 36    MI  . Diabetes Other   . Cancer Sister     breast  . Breast cancer Sister 65   Social History  Substance Use Topics  . Smoking status: Never Smoker  . Smokeless tobacco: Never Used  . Alcohol use No   Interim medical history since last visit reviewed. Allergies and medications reviewed  Review of Systems Per HPI unless specifically indicated above     Objective:    BP 116/64   Pulse 74   Temp 99.4 F (37.4 C) (Oral)   Resp 14   Wt 233 lb (105.7 kg)   LMP 08/26/2012   SpO2 96%   BMI 38.18 kg/m   Wt Readings from Last 3 Encounters:  02/29/16 233 lb (105.7 kg)  02/28/16 233 lb (105.7 kg)  12/27/15 238 lb 8.6 oz (108.2 kg)    Physical Exam  Constitutional: She appears well-developed and well-nourished.  Non-toxic appearance. No distress.  Weight down five pounds  HENT:  Right Ear: Hearing, tympanic membrane and ear canal normal. No drainage. Tympanic membrane is not erythematous. No middle ear effusion.  Left Ear: Hearing, tympanic membrane and ear canal normal. No drainage. Tympanic membrane is not erythematous.  No middle ear effusion.  Nose: No rhinorrhea.  Mouth/Throat: Oropharynx is clear and moist and mucous membranes are normal.  Cardiovascular: Normal rate and regular rhythm.   Pulmonary/Chest: Effort normal and breath sounds normal.  Lymphadenopathy:    She has no cervical adenopathy.  Neurological: She displays no tremor.  No tics  Skin: She is not diaphoretic. No pallor.  No nailbed pallor  Psychiatric: She has a normal  mood and affect. Her mood appears not anxious. She does not exhibit a depressed mood.  Good eye contact with examiner      Assessment & Plan:   Problem List Items Addressed This Visit      Endocrine   Hypothyroidism - Primary     Other   Obesity (  BMI 35.0-39.9 without comorbidity)    Encouraged patient to work on weight loss      Major depressive disorder, recurrent ([place])    Discussed current mood, will taper off of SSRI and taper on to SNRI; reasons to call reviewed; close f/u      Relevant Medications   escitalopram (LEXAPRO) 20 MG tablet   DULoxetine (CYMBALTA) 30 MG capsule   Breast cancer of lower-outer quadrant of left female breast ([place])    Followed by surgeon       Other Visit Diagnoses    Needs flu shot       Relevant Orders   Flu Vaccine QUAD 36+ mos PF IM (Fluarix & Fluzone Quad PF) (Completed)   Muscular aches       cymbalta may help with these symptoms, helping alleviate some of the anxiety that comes with each ache and pain       Follow up plan: Return for medicine change follow-up.  An after-visit summary was printed and given to the patient at [facility].  Please see the patient instructions which may contain other information and recommendations beyond what is mentioned above in the assessment and plan.  Meds ordered this encounter  Medications  . escitalopram (LEXAPRO) 20 MG tablet    Sig: Take just half of a pill by mouth for 10 days, then stop  . DULoxetine (CYMBALTA) 30 MG capsule    Sig: One by mouth daily for ten days, then two a day    Dispense:  50 capsule    Refill:  0    Orders Placed This Encounter  Procedures  . Flu Vaccine QUAD 36+ mos PF IM (Fluarix & Fluzone Quad PF)

## 2016-02-29 ENCOUNTER — Ambulatory Visit (INDEPENDENT_AMBULATORY_CARE_PROVIDER_SITE_OTHER): Payer: Medicaid Other | Admitting: General Surgery

## 2016-02-29 ENCOUNTER — Encounter: Payer: Self-pay | Admitting: General Surgery

## 2016-02-29 VITALS — BP 110/70 | HR 78 | Resp 14 | Ht 66.0 in | Wt 233.0 lb

## 2016-02-29 DIAGNOSIS — Z1211 Encounter for screening for malignant neoplasm of colon: Secondary | ICD-10-CM

## 2016-02-29 DIAGNOSIS — C50912 Malignant neoplasm of unspecified site of left female breast: Secondary | ICD-10-CM

## 2016-02-29 DIAGNOSIS — Z170 Estrogen receptor positive status [ER+]: Secondary | ICD-10-CM | POA: Diagnosis not present

## 2016-02-29 LAB — TSH: TSH: 2.23 m[IU]/L

## 2016-02-29 MED ORDER — POLYETHYLENE GLYCOL 3350 17 GM/SCOOP PO POWD: ORAL | 0 refills | Status: DC

## 2016-02-29 NOTE — Patient Instructions (Addendum)
Patient to return in six months diagnotic mammogram . Continue self breast exams. Call office for any new breast issues or concerns. Colonoscopy, Adult A colonoscopy is an exam to look at the entire large intestine. During the exam, a lubricated, bendable tube is inserted into the anus and then passed into the rectum, colon, and other parts of the large intestine. A colonoscopy is often done as a part of normal colorectal screening or in response to certain symptoms, such as anemia, persistent diarrhea, abdominal pain, and blood in the stool. The exam can help screen for and diagnose medical problems, including:  Tumors.  Polyps.  Inflammation.  Areas of bleeding. Tell a health care provider about:  Any allergies you have.  All medicines you are taking, including vitamins, herbs, eye drops, creams, and over-the-counter medicines.  Any problems you or family members have had with anesthetic medicines.  Any blood disorders you have.  Any surgeries you have had.  Any medical conditions you have.  Any problems you have had passing stool. What are the risks? Generally, this is a safe procedure. However, problems may occur, including:  Bleeding.  A tear in the intestine.  A reaction to medicines given during the exam.  Infection (rare). What happens before the procedure? Eating and drinking restrictions  Follow instructions from your health care provider about eating and drinking, which may include:  A few days before the procedure - follow a low-fiber diet. Avoid nuts, seeds, dried fruit, raw fruits, and vegetables.  1-3 days before the procedure - follow a clear liquid diet. Drink only clear liquids, such as clear broth or bouillon, black coffee or tea, clear juice, clear soft drinks or sports drinks, gelatin desert, and popsicles. Avoid any liquids that contain red or purple dye.  On the day of the procedure - do not eat or drink anything during the 2 hours before the  procedure, or within the time period that your health care provider recommends. Bowel prep  If you were prescribed an oral bowel prep to clean out your colon:  Take it as told by your health care provider. Starting the day before your procedure, you will need to drink a large amount of medicated liquid. The liquid will cause you to have multiple loose stools until your stool is almost clear or light green.  If your skin or anus gets irritated from diarrhea, you may use these to relieve the irritation:  Medicated wipes, such as adult wet wipes with aloe and vitamin E.  A skin soothing-product like petroleum jelly.  If you vomit while drinking the bowel prep, take a break for up to 60 minutes and then begin the bowel prep again. If vomiting continues and you cannot take the bowel prep without vomiting, call your health care provider. General instructions  Ask your health care provider about changing or stopping your regular medicines. This is especially important if you are taking diabetes medicines or blood thinners.  Plan to have someone take you home from the hospital or clinic. What happens during the procedure?  An IV tube may be inserted into one of your veins.  You will be given medicine to help you relax (sedative).  To reduce your risk of infection:  Your health care team will wash or sanitize their hands.  Your anal area will be washed with soap.  You will be asked to lie on your side with your knees bent.  Your health care provider will lubricate a long, thin, flexible tube.  The tube will have a camera and a light on the end.  The tube will be inserted into your anus.  The tube will be gently eased through your rectum and colon.  Air will be delivered into your colon to keep it open. You may feel some pressure or cramping.  The camera will be used to take images during the procedure.  A small tissue sample may be removed from your body to be examined under a  microscope (biopsy). If any potential problems are found, the tissue will be sent to a lab for testing.  If small polyps are found, your health care provider may remove them and have them checked for cancer cells.  The tube that was inserted into your anus will be slowly removed. The procedure may vary among health care providers and hospitals. What happens after the procedure?  Your blood pressure, heart rate, breathing rate, and blood oxygen level will be monitored until the medicines you were given have worn off.  Do not drive for 24 hours after the exam.  You may have a small amount of blood in your stool.  You may pass gas and have mild abdominal cramping or bloating due to the air that was used to inflate your colon during the exam.  It is up to you to get the results of your procedure. Ask your health care provider, or the department performing the procedure, when your results will be ready. This information is not intended to replace advice given to you by your health care provider. Make sure you discuss any questions you have with your health care provider. Document Released: 12/30/1999 Document Revised: 07/22/2015 Document Reviewed: 03/15/2015 Elsevier Interactive Patient Education  2017 [employer].

## 2016-02-29 NOTE — Progress Notes (Signed)
Patient ID: Robin Howard, female   DOB: 1960/01/21, 56 y.o.   MRN: [id number]  Chief Complaint  Patient presents with  . Breast Cancer    HPI Robin Howard is a 56 y.o. female here today forher follow up breast check check. Patient stattes she is doing well.   HPI  Past Medical History:  Diagnosis Date  . Anemia    h/o  . Breast cancer ([place]) 07/2014   T1c,N0, ER/ PR negative, Her 2 neu positive. Wide excision, SLN, Mammosite, adjuvant chemotherapy.   . Bronchitis 04-2014  . Chronic headache   . Coronary artery disease     small non-ST elevation myocardial infarction in April 2016 in January 2017. Cardiac catheterization in April 2016 showed mild nonobstructive disease affecting the LAD with tortuous vessels. CTA in 2017 showed only 30% disease in the LAD.  Marland Kitchen Detached retina 1997  . Family history of adverse reaction to anesthesia    pts dad has pseudocholinestrace deficieny  . Gastritis   . GERD (gastroesophageal reflux disease)   . HTN (hypertension)   . Hypothyroidism   . Insomnia   . Last menstrual period (LMP) > 10 days ago    LMP 2014  . Major depressive disorder, recurrent ([place])   . Migraines   . Morbid obesity ([place])   . NSTEMI (non-ST elevated myocardial infarction) ([place]) 04/16/14   Due to demand- normal Cath 04/19/14  . NSTEMI (non-ST elevated myocardial infarction) ([place]) 01-24-15  . OSA (obstructive sleep apnea)    untreated due to lack of insurance  . Overactive bladder   . Pericarditis 1997  . Pericarditis 1997  . Shortness of breath dyspnea    with exertion only    Past Surgical History:  Procedure Laterality Date  . BACK SURGERY     L4-5  . BRE[facility]SY [facility]   [facility]  . BREAST CYST ASPIRATION Left 12/13/2014   3:00 position 2 cmfn, fat necrosis per Dr. Dwyane Luo notes  . BREAST LUMPECTOMY WITH SENTINEL LYMPH NODE BIOPSY Left 08/13/2014   Procedure: BREAST LUMPECTOMY WITH SENTINEL LYMPH NODE BIOPSY;  Surgeon: Robert Bellow, MD;  Location: [facility] ORS;   Service: General;  Laterality: Left;  . BREAST MAMMOSITE Left 08/30/2014   Procedure: MAMMOSITE BREAST;  Surgeon: Robert Bellow, MD;  Location: [facility] ORS;  Service: General;  Laterality: Left;  . CESAREAN SECTION    . CORONARY ANGIOPLASTY  April 2016  . EYE SURGERY Left 10/16/2015   retina repair  . FOOT SURGERY    . KNEE SURGERY    . PORTACATH PLACEMENT Right 08/30/2014   Procedure: INSERTION PORT-A-CATH;  Surgeon: Robert Bellow, MD;  Location: [facility] ORS;  Service: General;  Laterality: Right;  . RETINAL DETACHMENT SURGERY Right 1997    Family History  Problem Relation Age of Onset  . Alcohol abuse Mother   . Thyroid disease Mother   . Hypertension Mother   . COPD Mother   . Cancer Father     liposarcoma  . Congestive Heart Failure Father     V Tach  . Depression Father   . Hypertension Father   . Heart attack Father   . Arrhythmia Father   . Heart failure Father   . Cancer Maternal Uncle     Pancreatic cancer  . Heart disease Paternal Grandfather 79    MI  . Diabetes Other   . Cancer Sister     breast  . Breast cancer Sister 43    Social  History Social History  Substance Use Topics  . Smoking status: Never Smoker  . Smokeless tobacco: Never Used  . Alcohol use No    Allergies  Allergen Reactions  . Reglan [Metoclopramide] Shortness Of Breath and Other (See Comments)    This medication caused a dystonic reaction.    Marland Kitchen Paxil [Paroxetine Hcl] Other (See Comments)    Reaction:  Blurred vision  . Tegaderm Ag Mesh [Silver] Rash  . Ultram [Tramadol] Itching and Rash    Current Outpatient Prescriptions  Medication Sig Dispense Refill  . acetaminophen (TYLENOL) 500 MG tablet Take 1,000 mg by mouth every 6 (six) hours as needed for mild pain or headache.    Marland Kitchen ascorbic acid (VITAMIN C) 250 MG CHEW Chew 250 mg by mouth daily.    Marland Kitchen aspirin EC 81 MG tablet Take 81 mg by mouth daily.    Marland Kitchen CALCIUM PO Take 1 tablet by mouth 2 (two) times daily.     .  cholecalciferol (VITAMIN D) 1000 UNITS tablet Take 1,000 Units by mouth daily.    . DULoxetine (CYMBALTA) 30 MG capsule One by mouth daily for ten days, then two a day 50 capsule 0  . escitalopram (LEXAPRO) 20 MG tablet Take just half of a pill by mouth for 10 days, then stop    . levothyroxine (SYNTHROID, LEVOTHROID) 100 MCG tablet Take 1 tablet (100 mcg total) by mouth daily. 30 tablet 1  . lidocaine-prilocaine (EMLA) cream Apply 1 application topically as needed (prior to accessing port).    . loratadine (CLARITIN) 10 MG tablet Take 10 mg by mouth daily.    . metoprolol succinate (TOPROL-XL) 100 MG 24 hr tablet Take 1 tablet (100 mg total) by mouth daily. Take with or immediately following a meal. 90 tablet 1  . Multiple Vitamin (MULTIVITAMIN WITH MINERALS) TABS tablet Take 1 tablet by mouth daily.    . ranitidine (ZANTAC) 300 MG tablet TAKE ONE TABLET BY MOUTH AT BEDTIME (REPLACES OMEPRAZOLE) 30 tablet 11  . simvastatin (ZOCOR) 10 MG tablet Take 1 tablet (10 mg total) by mouth daily. 90 tablet 1  . vitamin B-12 (CYANOCOBALAMIN) 1000 MCG tablet Take 1,000 mcg by mouth daily.    Marland Kitchen losartan (COZAAR) 25 MG tablet Take 1 tablet (25 mg total) by mouth daily. 25 tablet 5  . polyethylene glycol powder (GLYCOLAX/MIRALAX) powder 255 grams one bottle for colonoscopy prep 255 g 0   No current facility-administered medications for this visit.    Facility-Administered Medications Ordered in Other Visits  Medication Dose Route Frequency Provider Last Rate Last Dose  . sodium chloride 0.9 % injection 10 mL  10 mL Intracatheter PRN Lloyd Huger, MD   10 mL at 11/30/14 1205    Review of Systems Review of Systems  Constitutional: Negative.   Respiratory: Negative.   Cardiovascular: Negative.     Blood pressure 110/70, pulse 78, resp. rate 14, height 5\' 6"  (1.676 m), weight 233 lb (105.7 kg), last menstrual period 08/26/2012.  Physical Exam Physical Exam  Constitutional: She is oriented to  person, place, and time. She appears well-developed and well-nourished.  Eyes: Conjunctivae are normal. No scleral icterus.  Neck: Neck supple.  Cardiovascular: Normal rate, regular rhythm and normal heart sounds.   Pulmonary/Chest: Effort normal and breath sounds normal. Right breast exhibits no inverted nipple, no mass, no nipple discharge, no skin change and no tenderness. Left breast exhibits no inverted nipple, no mass, no nipple discharge, no skin change and no tenderness.  Left lumpectomy is clean and well healed. Tenderness on the serratus muscle .   Lymphadenopathy:    She has no cervical adenopathy.    She has no axillary adenopathy.  Neurological: She is alert and oriented to person, place, and time.  Skin: Skin is warm and dry.    Data Reviewed December 27, 2015 office note from [name], M.D. reviewed.  10/25/2015 cardiology notes reviewed.  Assessment    Doing well in regards to left breast cancer.  Candidate for screening colonoscopy.  PowerPort removal 1 approved by medical oncology.    Plan         Patient to return in six months diagnotic mammogram . Continue self breast exams. Call office for any new breast issues or concerns.   \Colonoscopy with possible biopsy/polypectomy prn: Information regarding the procedure, including its potential risks and complications (including but not limited to perforation of the bowel, which may require emergency surgery to repair, and bleeding) was verbally given to the patient. Educational information regarding lower intestinal endoscopy was given to the patient. Written instructions for how to complete the bowel prep using Miralax were provided. The importance of drinking ample fluids to avoid dehydration as a result of the prep emphasized.  Patient has been scheduled for a colonoscopy on 04-18-16 at [facility]. It is okay for patient to continue 81 mg aspirin once daily.  This information has been scribed by Gaspar Cola CMA.   Robert Bellow 03/01/2016, 3:40 PM

## 2016-03-01 ENCOUNTER — Encounter: Payer: Self-pay | Admitting: General Surgery

## 2016-03-04 ENCOUNTER — Encounter: Payer: Self-pay | Admitting: Family Medicine

## 2016-03-04 DIAGNOSIS — E669 Obesity, unspecified: Secondary | ICD-10-CM | POA: Insufficient documentation

## 2016-03-04 NOTE — Assessment & Plan Note (Signed)
Encouraged patient to work on weight loss 

## 2016-03-04 NOTE — Assessment & Plan Note (Signed)
Discussed current mood, will taper off of SSRI and taper on to SNRI; reasons to call reviewed; close f/u

## 2016-03-04 NOTE — Assessment & Plan Note (Signed)
Followed by surgeon

## 2016-03-20 ENCOUNTER — Telehealth: Payer: Self-pay | Admitting: *Deleted

## 2016-03-20 ENCOUNTER — Inpatient Hospital Stay: Payer: Medicaid Other | Attending: Oncology

## 2016-03-20 DIAGNOSIS — Z95828 Presence of other vascular implants and grafts: Secondary | ICD-10-CM

## 2016-03-20 DIAGNOSIS — Z452 Encounter for adjustment and management of vascular access device: Secondary | ICD-10-CM | POA: Insufficient documentation

## 2016-03-20 DIAGNOSIS — Z853 Personal history of malignant neoplasm of breast: Secondary | ICD-10-CM | POA: Insufficient documentation

## 2016-03-20 MED ORDER — HEPARIN SOD (PORK) LOCK FLUSH 100 UNIT/ML IV SOLN
500.0000 [IU] | Freq: Once | INTRAVENOUS | Status: AC
  Administered 2016-03-20: 500 [IU] via INTRAVENOUS

## 2016-03-20 MED ORDER — SODIUM CHLORIDE 0.9% FLUSH
10.0000 mL | INTRAVENOUS | Status: DC | PRN
  Administered 2016-03-20: 10 mL via INTRAVENOUS
  Filled 2016-03-20: qty 10

## 2016-03-20 NOTE — Telephone Encounter (Signed)
She does no need a mammo until August.  I'm ok port removal so she doesn't need the ELMA clean. Will fax order for port removal

## 2016-03-20 NOTE — Telephone Encounter (Signed)
Patient advised of Dr Virgel Manifold response and when asked who inserted her port, she stated Dr Bary Castilla.

## 2016-03-20 NOTE — Telephone Encounter (Signed)
-----   Message from [place] sent at 03/20/2016  1:46 PM EST ----- Regarding: med refill, port removal, and mammogram Contact: [phone] Patient came in and wanted to know when was Dr Grayland Ormond going to schedule her next mammogram and wanted to know if she can get her port removed. Patient also stated she needs a refill on her EMLA cream, please return call to patient @ [phone]. Thanks!

## 2016-03-21 NOTE — Telephone Encounter (Signed)
Faxed over port removal form to [place]'s office

## 2016-03-27 ENCOUNTER — Ambulatory Visit: Payer: Medicaid Other | Admitting: Oncology

## 2016-03-28 ENCOUNTER — Ambulatory Visit (INDEPENDENT_AMBULATORY_CARE_PROVIDER_SITE_OTHER): Payer: Medicaid Other | Admitting: Family Medicine

## 2016-03-28 ENCOUNTER — Encounter: Payer: Self-pay | Admitting: Family Medicine

## 2016-03-28 VITALS — BP 126/88 | HR 88 | Temp 99.8°F | Resp 14 | Wt 228.0 lb

## 2016-03-28 DIAGNOSIS — Z170 Estrogen receptor positive status [ER+]: Secondary | ICD-10-CM

## 2016-03-28 DIAGNOSIS — R590 Localized enlarged lymph nodes: Secondary | ICD-10-CM

## 2016-03-28 DIAGNOSIS — F330 Major depressive disorder, recurrent, mild: Secondary | ICD-10-CM | POA: Diagnosis not present

## 2016-03-28 DIAGNOSIS — R110 Nausea: Secondary | ICD-10-CM | POA: Diagnosis not present

## 2016-03-28 DIAGNOSIS — C50512 Malignant neoplasm of lower-outer quadrant of left female breast: Secondary | ICD-10-CM

## 2016-03-28 DIAGNOSIS — Z5181 Encounter for therapeutic drug level monitoring: Secondary | ICD-10-CM

## 2016-03-28 LAB — CBC WITH DIFFERENTIAL/PLATELET
BASOS PCT: 1 %
Basophils Absolute: 60 cells/uL (ref 0–200)
EOS ABS: 180 {cells}/uL (ref 15–500)
EOS PCT: 3 %
HCT: 38.3 % (ref 35.0–45.0)
Hemoglobin: 12.3 g/dL (ref 11.7–15.5)
LYMPHS PCT: 23 %
Lymphs Abs: 1380 cells/uL (ref 850–3900)
MCH: 28.3 pg (ref 27.0–33.0)
MCHC: 32.1 g/dL (ref 32.0–36.0)
MCV: 88.2 fL (ref 80.0–100.0)
MONOS PCT: 9 %
MPV: 10.6 fL (ref 7.5–12.5)
Monocytes Absolute: 540 cells/uL (ref 200–950)
NEUTROS ABS: 3840 {cells}/uL (ref 1500–7800)
Neutrophils Relative %: 64 %
PLATELETS: 234 10*3/uL (ref 140–400)
RBC: 4.34 MIL/uL (ref 3.80–5.10)
RDW: 14.7 % (ref 11.0–15.0)
WBC: 6 10*3/uL (ref 3.8–10.8)

## 2016-03-28 LAB — COMPLETE METABOLIC PANEL WITH GFR
ALBUMIN: 4 g/dL (ref 3.6–5.1)
ALK PHOS: 69 U/L (ref 33–130)
ALT: 17 U/L (ref 6–29)
AST: 21 U/L (ref 10–35)
BILIRUBIN TOTAL: 0.9 mg/dL (ref 0.2–1.2)
BUN: 13 mg/dL (ref 7–25)
CALCIUM: 9.4 mg/dL (ref 8.6–10.4)
CO2: 28 mmol/L (ref 20–31)
CREATININE: 1.08 mg/dL — AB (ref 0.50–1.05)
Chloride: 104 mmol/L (ref 98–110)
GFR, EST NON AFRICAN AMERICAN: 58 mL/min — AB (ref >=60)
GFR, Est African American: 67 mL/min (ref >=60)
Glucose, Bld: 111 mg/dL — ABNORMAL HIGH (ref 65–99)
Potassium: 4.5 mmol/L (ref 3.5–5.3)
Sodium: 139 mmol/L (ref 135–146)
TOTAL PROTEIN: 6.6 g/dL (ref 6.1–8.1)

## 2016-03-28 NOTE — Assessment & Plan Note (Signed)
Recommended counseling

## 2016-03-28 NOTE — Progress Notes (Signed)
BP 126/88   Pulse 88   Temp 99.8 F (37.7 C) (Oral)   Resp 14   Wt 228 lb (103.4 kg)   LMP 08/26/2012   SpO2 99%   BMI 36.80 kg/m    Subjective:    Patient ID: Rhina Brackett, female    DOB: 10/09/1960, 56 y.o.   MRN: [id number]  HPI: BIANNEY ROCKWOOD is a 56 y.o. female  Chief Complaint  Patient presents with  . Follow-up   Having occasional sweats; just didn't feel good yesterday; headache; having aches; has had a little runny nose, taking claritin; little bit of cough; little nausea; no rash, no travel  Right shoulder aching; going on for weeks at least; notices when going to lift her arm; right-handed; no xrays; fell out of the bed since last visit; lost balance getting up to go to the bathroom and hit her nightstand, but didn't hit her arm  Getting port out next Tuesday  She is here for f/u of the her mood; on cymbalta 60 mg daily;   Eye sight is getting worse; just since the cymbalta addition; last eye exam was in January; had a tear in her retina in October; not in reading glasses yet  Thyroid test was normal  Depression screen [facility] 2/9 03/28/2016 02/28/2016 12/27/2015 09/22/2015 09/22/2015  Decreased Interest 0 - 0 1 0  Down, Depressed, Hopeless 1 1 0 1 0  PHQ - 2 Score 1 1 0 2 0  Altered sleeping - - - 0 -  Tired, decreased energy - - - 1 -  Change in appetite - - - 0 -  Feeling bad or failure about yourself  - - - 1 -  Trouble concentrating - - - 1 -  Moving slowly or fidgety/restless - - - 0 -  Suicidal thoughts - - - 0 -  PHQ-9 Score - - - 5 -  Difficult doing work/chores - - - Somewhat difficult -    No flowsheet data found.  Relevant past medical, surgical, family and social history reviewed Past Medical History:  Diagnosis Date  . Anemia    h/o  . Breast cancer ([place]) 07/2014   T1c,N0, ER/ PR negative, Her 2 neu positive. Wide excision, SLN, Mammosite, adjuvant chemotherapy.   . Bronchitis 04-2014  . Chronic headache   . Coronary artery disease     small  non-ST elevation myocardial infarction in April 2016 in January 2017. Cardiac catheterization in April 2016 showed mild nonobstructive disease affecting the LAD with tortuous vessels. CTA in 2017 showed only 30% disease in the LAD.  Marland Kitchen Detached retina 1997  . Family history of adverse reaction to anesthesia    pts dad has pseudocholinestrace deficieny  . Gastritis   . GERD (gastroesophageal reflux disease)   . HTN (hypertension)   . Hypothyroidism   . Insomnia   . Last menstrual period (LMP) > 10 days ago    LMP 2014  . Major depressive disorder, recurrent ([place])   . Migraines   . Morbid obesity ([place])   . NSTEMI (non-ST elevated myocardial infarction) ([place]) 04/16/14   Due to demand- normal Cath 04/19/14  . NSTEMI (non-ST elevated myocardial infarction) ([place]) 01-24-15  . Obesity (BMI 35.0-39.9 without comorbidity) 03/04/2016  . OSA (obstructive sleep apnea)    untreated due to lack of insurance  . Overactive bladder   . Pericarditis 1997  . Pericarditis 1997  . Shortness of breath dyspnea    with exertion only  Past Surgical History:  Procedure Laterality Date  . BACK SURGERY     L4-5  . BREAST [facility]OPSY [facility] 07/2014   [facility]  . BREAST CYST ASPIRATION Left 12/13/2014   3:00 position 2 cmfn, fat necrosis per Dr. Dwyane Luo notes  . BREAST LUMPECTOMY WITH SENTINEL LYMPH NODE BIOPSY Left 08/13/2014   Procedure: BREAST LUMPECTOMY WITH SENTINEL LYMPH NODE BIOPSY;  Surgeon: Robert Bellow, MD;  Location: [facility] ORS;  Service: General;  Laterality: Left;  . BREAST MAMMOSITE Left 08/30/2014   Procedure: MAMMOSITE BREAST;  Surgeon: Robert Bellow, MD;  Location: [facility] ORS;  Service: General;  Laterality: Left;  . CESAREAN SECTION    . CORONARY ANGIOPLASTY  April 2016  . EYE SURGERY Left 10/16/2015   retina repair  . FOOT SURGERY    . KNEE SURGERY    . PORTACATH PLACEMENT Right 08/30/2014   Procedure: INSERTION PORT-A-CATH;  Surgeon: Robert Bellow, MD;  Location: [facility] ORS;  Service: General;   Laterality: Right;  . RETINAL DETACHMENT SURGERY Right 1997   Family History  Problem Relation Age of Onset  . Alcohol abuse Mother   . Thyroid disease Mother   . Hypertension Mother   . COPD Mother   . Cancer Father     liposarcoma  . Congestive Heart Failure Father     V Tach  . Depression Father   . Hypertension Father   . Heart attack Father   . Arrhythmia Father   . Heart failure Father   . Cancer Maternal Uncle     Pancreatic cancer  . Heart disease Paternal Grandfather 34    MI  . Diabetes Other   . Cancer Sister     breast  . Breast cancer Sister 52   Social History  Substance Use Topics  . Smoking status: Never Smoker  . Smokeless tobacco: Never Used  . Alcohol use No    Interim medical history since last visit reviewed. Allergies and medications reviewed  Review of Systems Per HPI unless specifically indicated above     Objective:    BP 126/88   Pulse 88   Temp 99.8 F (37.7 C) (Oral)   Resp 14   Wt 228 lb (103.4 kg)   LMP 08/26/2012   SpO2 99%   BMI 36.80 kg/m   Wt Readings from Last 3 Encounters:  03/28/16 228 lb (103.4 kg)  02/29/16 233 lb (105.7 kg)  02/28/16 233 lb (105.7 kg)    Physical Exam  Constitutional: She appears well-developed and well-nourished. No distress.  HENT:  Head: Normocephalic and atraumatic.  Right Ear: Tympanic membrane and ear canal normal.  Left Ear: Tympanic membrane and ear canal normal.  Nose: No rhinorrhea.  Mouth/Throat: Oropharynx is clear and moist and mucous membranes are normal.  Eyes: EOM are normal. No scleral icterus.  Neck: No thyromegaly present.  Cardiovascular: Normal rate, regular rhythm and normal heart sounds.   No murmur heard. Pulmonary/Chest: Effort normal and breath sounds normal. No respiratory distress. She has no wheezes.  Abdominal: Soft. Bowel sounds are normal. She exhibits no distension. There is tenderness (mild) in the right upper quadrant and left upper quadrant. There is no  rigidity and no guarding.  Musculoskeletal: Normal range of motion. She exhibits no edema.  Lymphadenopathy:    She has cervical adenopathy.       Right cervical: Posterior cervical (1+) adenopathy present.       Left cervical: Posterior cervical (shoddy) adenopathy present.  Neurological: She is alert.  She exhibits normal muscle tone.  Skin: Skin is warm and dry. No rash noted. She is not diaphoretic. No pallor.  Psychiatric: She has a normal mood and affect. Her behavior is normal. Judgment and thought content normal. Her mood appears not anxious. Her affect is not labile. She does not exhibit a depressed mood. She expresses no homicidal and no suicidal ideation.  Good eye contact w/examiner, does not appear depressed; slightly flattened affect    Results for orders placed or performed in visit on 02/28/16  TSH  Result Value Ref Range   TSH 2.23 mIU/L      Assessment & Plan:   Problem List Items Addressed This Visit      Other   Major depressive disorder, recurrent ([place])    Recommended counseling      Breast cancer of lower-outer quadrant of left female breast ([place])    Followed by Dr. Grayland Ormond       Other Visit Diagnoses    Posterior cervical lymphadenopathy    -  Primary   ddx inludes cmv, ebv, will get labs, avoid strenuous activity; if labs negative, symptoms persist, keep in mind cancer dx and work-up   Relevant Orders   CMV abs, IgG+IgM (cytomegalovirus)   Epstein-Barr Virus VCA Antibody Panel   CBC with Differential/Platelet   Nausea       likely related to current illness; doubt medicine effect   Relevant Orders   CMV abs, IgG+IgM (cytomegalovirus)   Epstein-Barr Virus VCA Antibody Panel   Medication monitoring encounter       Relevant Orders   COMPLETE METABOLIC PANEL WITH GFR   CBC with Differential/Platelet       Follow up plan: No Follow-up on file.  An after-visit summary was printed and given to the patient at [place].  Please see the patient  instructions which may contain other information and recommendations beyond what is mentioned above in the assessment and plan.  Meds ordered this encounter  Medications  . Naproxen Sodium (ALEVE PO)    Sig: Take by mouth.    Orders Placed This Encounter  Procedures  . CMV abs, IgG+IgM (cytomegalovirus)  . Epstein-Barr Virus VCA Antibody Panel  . COMPLETE METABOLIC PANEL WITH GFR  . CBC with Differential/Platelet

## 2016-03-28 NOTE — Assessment & Plan Note (Signed)
Followed by Dr Finnegan.  

## 2016-03-29 ENCOUNTER — Other Ambulatory Visit: Payer: Self-pay | Admitting: Family Medicine

## 2016-03-29 LAB — EPSTEIN-BARR VIRUS VCA ANTIBODY PANEL
EBV NA IgG: 228 U/mL — ABNORMAL HIGH
EBV VCA IgM: <36 U/mL

## 2016-03-30 LAB — CMV ABS, IGG+IGM (CYTOMEGALOVIRUS)

## 2016-03-30 MED ORDER — DULOXETINE HCL 60 MG PO CPEP: 60.0000 mg | ORAL_CAPSULE | Freq: Every day | ORAL | 2 refills | Status: DC

## 2016-04-02 ENCOUNTER — Emergency Department: Payer: Medicaid Other

## 2016-04-02 ENCOUNTER — Encounter: Payer: Self-pay | Admitting: Emergency Medicine

## 2016-04-02 ENCOUNTER — Emergency Department
Admission: EM | Admit: 2016-04-02 | Discharge: 2016-04-02 | Disposition: A | Discharge status: Home / Self Care | Payer: Medicaid Other | Attending: Emergency Medicine | Admitting: Emergency Medicine

## 2016-04-02 DIAGNOSIS — Y939 Activity, unspecified: Secondary | ICD-10-CM | POA: Diagnosis not present

## 2016-04-02 DIAGNOSIS — S0083XA Contusion of other part of head, initial encounter: Secondary | ICD-10-CM

## 2016-04-02 DIAGNOSIS — I10 Essential (primary) hypertension: Secondary | ICD-10-CM | POA: Insufficient documentation

## 2016-04-02 DIAGNOSIS — Z7982 Long term (current) use of aspirin: Secondary | ICD-10-CM | POA: Diagnosis not present

## 2016-04-02 DIAGNOSIS — S01511A Laceration without foreign body of lip, initial encounter: Secondary | ICD-10-CM | POA: Insufficient documentation

## 2016-04-02 DIAGNOSIS — Z79899 Other long term (current) drug therapy: Secondary | ICD-10-CM | POA: Insufficient documentation

## 2016-04-02 DIAGNOSIS — W06XXXA Fall from bed, initial encounter: Secondary | ICD-10-CM | POA: Insufficient documentation

## 2016-04-02 DIAGNOSIS — Y929 Unspecified place or not applicable: Secondary | ICD-10-CM | POA: Insufficient documentation

## 2016-04-02 DIAGNOSIS — Y999 Unspecified external cause status: Secondary | ICD-10-CM | POA: Diagnosis not present

## 2016-04-02 DIAGNOSIS — E039 Hypothyroidism, unspecified: Secondary | ICD-10-CM | POA: Diagnosis not present

## 2016-04-02 DIAGNOSIS — T148XXA Other injury of unspecified body region, initial encounter: Secondary | ICD-10-CM

## 2016-04-02 DIAGNOSIS — Z853 Personal history of malignant neoplasm of breast: Secondary | ICD-10-CM | POA: Diagnosis not present

## 2016-04-02 DIAGNOSIS — I2510 Atherosclerotic heart disease of native coronary artery without angina pectoris: Secondary | ICD-10-CM | POA: Diagnosis not present

## 2016-04-02 MED ORDER — NEOMYCIN-POLYMYXIN-PRAMOXINE 1 % EX CREA: TOPICAL_CREAM | Freq: Two times a day (BID) | CUTANEOUS | 0 refills | Status: DC

## 2016-04-02 MED ORDER — OXYCODONE-ACETAMINOPHEN 5-325 MG PO TABS
1.0000 | ORAL_TABLET | Freq: Once | ORAL | Status: AC
  Administered 2016-04-02: 1 via ORAL
  Filled 2016-04-02: qty 1

## 2016-04-02 MED ORDER — LIDOCAINE VISCOUS 2 % MT SOLN: 5.0000 mL | Freq: Four times a day (QID) | OROMUCOSAL | 0 refills | Status: DC | PRN

## 2016-04-02 MED ORDER — OXYCODONE-ACETAMINOPHEN 5-325 MG PO TABS: 1.0000 | ORAL_TABLET | Freq: Four times a day (QID) | ORAL | 0 refills | Status: DC | PRN

## 2016-04-02 MED ORDER — BACITRACIN ZINC 500 UNIT/GM EX OINT
TOPICAL_OINTMENT | Freq: Once | CUTANEOUS | Status: AC
  Administered 2016-04-02: 1 via TOPICAL
  Filled 2016-04-02: qty 0.9

## 2016-04-02 NOTE — ED Provider Notes (Signed)
[facility] Emergency Department Provider Note   ____________________________________________   First MD Initiated Contact with Patient 04/02/16 1348     (approximate)  I have reviewed the triage vital signs and the nursing notes.   HISTORY  Chief Complaint Fall    HPI Robin Howard is a 56 y.o. female patient complain of facial pain consistent of an abrasion and small laceration to the upper lip and under her nose. Patient also has an abrasion to the chin, left inner arm,  and dorsal aspect of right foot.   Past Medical History:  Diagnosis Date  . Anemia    h/o  . Breast cancer ([place]) 07/2014   T1c,N0, ER/ PR negative, Her 2 neu positive. Wide excision, SLN, Mammosite, adjuvant chemotherapy.   . Bronchitis 04-2014  . Chronic headache   . Coronary artery disease     small non-ST elevation myocardial infarction in April 2016 in January 2017. Cardiac catheterization in April 2016 showed mild nonobstructive disease affecting the LAD with tortuous vessels. CTA in 2017 showed only 30% disease in the LAD.  Marland Kitchen Detached retina 1997  . Family history of adverse reaction to anesthesia    pts dad has pseudocholinestrace deficieny  . Gastritis   . GERD (gastroesophageal reflux disease)   . HTN (hypertension)   . Hypothyroidism   . Insomnia   . Last menstrual period (LMP) > 10 days ago    LMP 2014  . Major depressive disorder, recurrent ([place])   . Migraines   . Morbid obesity ([place])   . NSTEMI (non-ST elevated myocardial infarction) ([place]) 04/16/14   Due to demand- normal Cath 04/19/14  . NSTEMI (non-ST elevated myocardial infarction) ([place]) 01-24-15  . Obesity (BMI 35.0-39.9 without comorbidity) 03/04/2016  . OSA (obstructive sleep apnea)    untreated due to lack of insurance  . Overactive bladder   . Pericarditis 1997  . Pericarditis 1997  . Shortness of breath dyspnea    with exertion only    Patient Active Problem List   Diagnosis Date Noted  . Obesity  (BMI 35.0-39.9 without comorbidity) 03/04/2016  . Anemia 09/22/2015  . Hypocalcemia 09/22/2015  . Coronary artery disease   . NSTEMI (non-ST elevated myocardial infarction) ([place]) 01/25/2015  . Angina pectoris ([place]) 01/24/2015  . Elevated troponin 01/24/2015  . Colitis 09/23/2014  . Breast cancer of lower-outer quadrant of left female breast ([place]) 08/10/2014  . Migraine 07/15/2014  . Fatigue 07/15/2014  . Major depressive disorder, recurrent ([place])   . HTN (hypertension)   . Overactive bladder   . Hypothyroidism   . OSA (obstructive sleep apnea)   . GERD (gastroesophageal reflux disease)   . Chronic headache     Past Surgical History:  Procedure Laterality Date  . BACK SURGERY     L4-5  . [facility]SY [facility]   [facility]  . BREAST CYST ASPIRATION Left 12/13/2014   3:00 position 2 cmfn, fat necrosis per Dr. Dwyane Luo notes  . BREAST LUMPECTOMY WITH SENTINEL LYMPH NODE BIOPSY Left 08/13/2014   Procedure: BREAST LUMPECTOMY WITH SENTINEL LYMPH NODE BIOPSY;  Surgeon: Robert Bellow, MD;  Location: [facility] ORS;  Service: General;  Laterality: Left;  . BREAST MAMMOSITE Left 08/30/2014   Procedure: MAMMOSITE BREAST;  Surgeon: Robert Bellow, MD;  Location: [facility] ORS;  Service: General;  Laterality: Left;  . CESAREAN SECTION    . CORONARY ANGIOPLASTY  April 2016  . EYE SURGERY Left 10/16/2015   retina repair  . FOOT SURGERY    .  KNEE SURGERY    . PORTACATH PLACEMENT Right 08/30/2014   Procedure: INSERTION PORT-A-CATH;  Surgeon: Robert Bellow, MD;  Location: [facility] ORS;  Service: General;  Laterality: Right;  . RETINAL DETACHMENT SURGERY Right 1997    Prior to Admission medications   Medication Sig Start Date End Date Taking? Authorizing Provider  acetaminophen (TYLENOL) 500 MG tablet Take 1,000 mg by mouth every 6 (six) hours as needed for mild pain or headache.    Historical Provider, MD  ascorbic acid (VITAMIN C) 250 MG CHEW Chew 250 mg by mouth daily.    Historical Provider, MD   aspirin EC 81 MG tablet Take 81 mg by mouth daily.    Historical Provider, MD  CALCIUM PO Take 1 tablet by mouth 2 (two) times daily.     Historical Provider, MD  cholecalciferol (VITAMIN D) 1000 UNITS tablet Take 1,000 Units by mouth daily.    Historical Provider, MD  DULoxetine (CYMBALTA) 60 MG capsule Take 1 capsule (60 mg total) by mouth daily. 03/30/16   Arnetha Courser, MD  levothyroxine (SYNTHROID, LEVOTHROID) 100 MCG tablet Take 1 tablet (100 mcg total) by mouth daily. 01/11/16   Arnetha Courser, MD  lidocaine (XYLOCAINE) 2 % solution Use as directed 5 mLs in the mouth or throat every 6 (six) hours as needed for mouth pain. 04/02/16   Sable Feil, PA-C  loratadine (CLARITIN) 10 MG tablet Take 10 mg by mouth daily.    Historical Provider, MD  losartan (COZAAR) 25 MG tablet Take 1 tablet (25 mg total) by mouth daily. 10/25/15 01/23/16  Wellington Hampshire, MD  metoprolol succinate (TOPROL-XL) 100 MG 24 hr tablet Take 1 tablet (100 mg total) by mouth daily. Take with or immediately following a meal. 10/25/15   Wellington Hampshire, MD  Multiple Vitamin (MULTIVITAMIN WITH MINERALS) TABS tablet Take 1 tablet by mouth daily.    Historical Provider, MD  Naproxen Sodium (ALEVE PO) Take by mouth.    Historical Provider, MD  neomycin-polymyxin-pramoxine (NEOSPORIN PLUS) 1 % cream Apply topically 2 (two) times daily. 04/02/16   Sable Feil, PA-C  oxyCODONE-acetaminophen (ROXICET) 5-325 MG tablet Take 1 tablet by mouth every 6 (six) hours as needed for moderate pain. 04/02/16   Sable Feil, PA-C  polyethylene glycol powder [facility]) powder 255 grams one bottle for colonoscopy prep 02/29/16   Robert Bellow, MD  ranitidine (ZANTAC) 300 MG tablet TAKE ONE TABLET BY MOUTH AT BEDTIME (REPLACES OMEPRAZOLE) 02/22/16   Arnetha Courser, MD  simvastatin (ZOCOR) 10 MG tablet Take 1 tablet (10 mg total) by mouth daily. 10/25/15   Wellington Hampshire, MD  vitamin B-12 (CYANOCOBALAMIN) 1000 MCG tablet Take 1,000 mcg  by mouth daily.    Historical Provider, MD    Allergies Reglan [metoclopramide]; Paxil [paroxetine hcl]; Tegaderm ag mesh [silver]; and Ultram [tramadol]  Family History  Problem Relation Age of Onset  . Alcohol abuse Mother   . Thyroid disease Mother   . Hypertension Mother   . COPD Mother   . Cancer Father     liposarcoma  . Congestive Heart Failure Father     V Tach  . Depression Father   . Hypertension Father   . Heart attack Father   . Arrhythmia Father   . Heart failure Father   . Cancer Maternal Uncle     Pancreatic cancer  . Heart disease Paternal Grandfather 26    MI  . Diabetes Other   . Cancer  Sister     breast  . Breast cancer Sister 67    Social History Social History  Substance Use Topics  . Smoking status: Never Smoker  . Smokeless tobacco: Never Used  . Alcohol use No    Review of Systems Constitutional: No fever/chills Eyes: No visual changes. ENT: No sore throat. Cardiovascular: Denies chest pain. Respiratory: Denies shortness of breath. Gastrointestinal: No abdominal pain.  No nausea, no vomiting.  No diarrhea.  No constipation. Genitourinary: Negative for dysuria. Musculoskeletal: Negative for back pain. Skin: Negative for rash. Neurological: Negative for headaches, focal weakness or numbness. Endocrine:Hypertension hypothyroidism Allergic/Immunilogical: See medication list ____________________________________________   PHYSICAL EXAM:  VITAL SIGNS: ED Triage Vitals  Enc Vitals Group     BP 04/02/16 1326 (!) 133/95     Pulse Rate 04/02/16 1326 65     Resp 04/02/16 1326 18     Temp 04/02/16 1326 99.2 F (37.3 C)     Temp Source 04/02/16 1326 Oral     SpO2 04/02/16 1326 96 %     Weight 04/02/16 1326 228 lb (103.4 kg)     Height 04/02/16 1326 5' 5.5" (1.664 m)     Head Circumference --      Peak Flow --      Pain Score 04/02/16 1327 5     Pain Loc --      Pain Edu? --      Excl. in [place]? --     Constitutional: Alert and  oriented. Well appearing and in no acute distress. Eyes: Conjunctivae are normal. PERRL. EOMI. Head: Atraumatic. Nose: No congestion/rhinnorhea. Mouth/Throat: Mucous membranes are moist. Upper inner lip laceration  Oropharynx non-erythematous. Neck: No stridor.  No cervical spine tenderness to palpation. Hematological/Lymphatic/Immunilogical: No cervical lymphadenopathy. Cardiovascular: Normal rate, regular rhythm. Grossly normal heart sounds.  Good peripheral circulation. Respiratory: Normal respiratory effort.  No retractions. Lungs CTAB. Gastrointestinal: Soft and nontender. No distention. No abdominal bruits. No CVA tenderness. Musculoskeletal: No lower extremity tenderness nor edema.  No joint effusions. Neurologic:  Normal speech and language. No gross focal neurologic deficits are appreciated. No gait instability. Skin:  Skin is warm, dry and intact. No rash noted. Psychiatric: Mood and affect are normal. Speech and behavior are normal.  ____________________________________________   LABS (all labs ordered are listed, but only abnormal results are displayed)  Labs Reviewed - No data to display ____________________________________________  EKG   ____________________________________________  RADIOLOGY  No acute findings of facial bone x-rays. ____________________________________________   PROCEDURES  Procedure(s) performed: None  Procedures  Critical Care performed: No  ____________________________________________   INITIAL IMPRESSION / ASSESSMENT AND PLAN / ED COURSE  Pertinent labs & imaging results that were available during my care of the patient were reviewed by me and considered in my medical decision making (see chart for details).  Facial contusion, lip laceration, and abrasions. Patient given discharge care instructions. Patient given prescription for Percocet, Neosporin, and viscous lidocaine. Patient advised follow-up family doctor condition  persists.      ____________________________________________   FINAL CLINICAL IMPRESSION(S) / ED DIAGNOSES  Final diagnoses:  Contusion of face, initial encounter  Lip laceration, initial encounter  Abrasion      NEW MEDICATIONS STARTED DURING THIS VISIT:  Discharge Medication List as of 04/02/2016  3:08 PM    START taking these medications   Details  lidocaine (XYLOCAINE) 2 % solution Use as directed 5 mLs in the mouth or throat every 6 (six) hours as needed for mouth pain., Starting Mon  04/02/2016, Print    oxyCODONE-acetaminophen (ROXICET) 5-325 MG tablet Take 1 tablet by mouth every 6 (six) hours as needed for moderate pain., Starting Mon 04/02/2016, Print         Note:  This document was prepared using Dragon voice recognition software and may include unintentional dictation errors.    Sable Feil, PA-C 04/02/16 1625    Eula Listen, MD 04/03/16 1539

## 2016-04-02 NOTE — ED Triage Notes (Signed)
Pt presents after tripping on a bed frame today. Denies head injury or LOC. She hit the ground face first, has abrasion under nose, as well as on chin. She also has an abrasion on left inner arm. Pt alert & oriented with NAD noted.

## 2016-04-02 NOTE — ED Notes (Signed)
See triage note states she tripped and fell face first    Abrasions noted to face and arm  Denies any LOC

## 2016-04-03 ENCOUNTER — Encounter: Payer: Self-pay | Admitting: General Surgery

## 2016-04-03 ENCOUNTER — Ambulatory Visit (INDEPENDENT_AMBULATORY_CARE_PROVIDER_SITE_OTHER): Payer: Medicaid Other | Admitting: General Surgery

## 2016-04-03 VITALS — BP 130/72 | HR 66 | Resp 12 | Ht 67.0 in | Wt 225.0 lb

## 2016-04-03 DIAGNOSIS — C50912 Malignant neoplasm of unspecified site of left female breast: Secondary | ICD-10-CM | POA: Diagnosis not present

## 2016-04-03 DIAGNOSIS — Z170 Estrogen receptor positive status [ER+]: Secondary | ICD-10-CM | POA: Diagnosis not present

## 2016-04-03 NOTE — Patient Instructions (Signed)
The patient is aware to call back for any questions or concerns. May shower May use ice pack as needed for comfort May remove dressing in 2-3 days. Steri strip will gradually come off in 2-3 weeks.

## 2016-04-03 NOTE — Progress Notes (Signed)
Patient ID: Robin Howard, female   DOB: 1960-06-28, 56 y.o.   MRN: [id number]  Chief Complaint  Patient presents with  . Procedure    port removal    HPI Robin Howard is a 56 y.o. female.  Here today for port removal.  HPI  Past Medical History:  Diagnosis Date  . Anemia    h/o  . Breast cancer ([place]) 07/2014   T1c,N0, ER/ PR negative, Her 2 neu positive. Wide excision, SLN, Mammosite, adjuvant chemotherapy.   . Bronchitis 04-2014  . Chronic headache   . Coronary artery disease     small non-ST elevation myocardial infarction in April 2016 in January 2017. Cardiac catheterization in April 2016 showed mild nonobstructive disease affecting the LAD with tortuous vessels. CTA in 2017 showed only 30% disease in the LAD.  Marland Kitchen Detached retina 1997  . Family history of adverse reaction to anesthesia    pts dad has pseudocholinestrace deficieny  . Gastritis   . GERD (gastroesophageal reflux disease)   . HTN (hypertension)   . Hypothyroidism   . Insomnia   . Last menstrual period (LMP) > 10 days ago    LMP 2014  . Major depressive disorder, recurrent ([place])   . Migraines   . Morbid obesity ([place])   . NSTEMI (non-ST elevated myocardial infarction) ([place]) 04/16/14   Due to demand- normal Cath 04/19/14  . NSTEMI (non-ST elevated myocardial infarction) ([place]) 01-24-15  . Obesity (BMI 35.0-39.9 without comorbidity) 03/04/2016  . OSA (obstructive sleep apnea)    untreated due to lack of insurance  . Overactive bladder   . Pericarditis 1997  . Pericarditis 1997  . Shortness of breath dyspnea    with exertion only    Past Surgical History:  Procedure Laterality Date  . BACK SURGERY     L4-5  . BREAST BIOPSY Left 07/2014   [facility]  . BREAST CYST ASPIRATION Left 12/13/2014   3:00 position 2 cmfn, fat necrosis per Dr. Dwyane Luo notes  . BREAST LUMPECTOMY WITH SENTINEL LYMPH NODE BIOPSY Left 08/13/2014   Procedure: BREAST LUMPECTOMY WITH SENTINEL LYMPH NODE BIOPSY;  Surgeon: Robert Bellow, MD;   Location: [facility] ORS;  Service: General;  Laterality: Left;  . BREAST MAMMOSITE Left 08/30/2014   Procedure: MAMMOSITE BREAST;  Surgeon: Robert Bellow, MD;  Location: [facility] ORS;  Service: General;  Laterality: Left;  . CESAREAN SECTION    . CORONARY ANGIOPLASTY  April 2016  . EYE SURGERY Left 10/16/2015   retina repair  . FOOT SURGERY    . KNEE SURGERY    . PORTACATH PLACEMENT Right 08/30/2014   Procedure: INSERTION PORT-A-CATH;  Surgeon: Robert Bellow, MD;  Location: [facility] ORS;  Service: General;  Laterality: Right;  . RETINAL DETACHMENT SURGERY Right 1997    Family History  Problem Relation Age of Onset  . Alcohol abuse Mother   . Thyroid disease Mother   . Hypertension Mother   . COPD Mother   . Cancer Father     liposarcoma  . Congestive Heart Failure Father     V Tach  . Depression Father   . Hypertension Father   . Heart attack Father   . Arrhythmia Father   . Heart failure Father   . Cancer Maternal Uncle     Pancreatic cancer  . Heart disease Paternal Grandfather 27    MI  . Diabetes Other   . Cancer Sister     breast  . Breast cancer Sister 33  Social History Social History  Substance Use Topics  . Smoking status: Never Smoker  . Smokeless tobacco: Never Used  . Alcohol use No    Allergies  Allergen Reactions  . Reglan [Metoclopramide] Shortness Of Breath and Other (See Comments)    This medication caused a dystonic reaction.    Marland Kitchen Paxil [Paroxetine Hcl] Other (See Comments)    Reaction:  Blurred vision  . Tegaderm Ag Mesh [Silver] Rash  . Ultram [Tramadol] Itching and Rash    Current Outpatient Prescriptions  Medication Sig Dispense Refill  . acetaminophen (TYLENOL) 500 MG tablet Take 1,000 mg by mouth every 6 (six) hours as needed for mild pain or headache.    Marland Kitchen ascorbic acid (VITAMIN C) 250 MG CHEW Chew 250 mg by mouth daily.    Marland Kitchen aspirin EC 81 MG tablet Take 81 mg by mouth daily.    Marland Kitchen CALCIUM PO Take 1 tablet by mouth 2 (two) times daily.      . cholecalciferol (VITAMIN D) 1000 UNITS tablet Take 1,000 Units by mouth daily.    . DULoxetine (CYMBALTA) 60 MG capsule Take 1 capsule (60 mg total) by mouth daily. 90 capsule 2  . levothyroxine (SYNTHROID, LEVOTHROID) 100 MCG tablet Take 1 tablet (100 mcg total) by mouth daily. 30 tablet 1  . lidocaine (XYLOCAINE) 2 % solution Use as directed 5 mLs in the mouth or throat every 6 (six) hours as needed for mouth pain. 100 mL 0  . loratadine (CLARITIN) 10 MG tablet Take 10 mg by mouth daily.    . metoprolol succinate (TOPROL-XL) 100 MG 24 hr tablet Take 1 tablet (100 mg total) by mouth daily. Take with or immediately following a meal. 90 tablet 1  . Multiple Vitamin (MULTIVITAMIN WITH MINERALS) TABS tablet Take 1 tablet by mouth daily.    . Naproxen Sodium (ALEVE PO) Take by mouth.    . neomycin-polymyxin-pramoxine (NEOSPORIN PLUS) 1 % cream Apply topically 2 (two) times daily. 14.2 g 0  . oxyCODONE-acetaminophen (ROXICET) 5-325 MG tablet Take 1 tablet by mouth every 6 (six) hours as needed for moderate pain. 12 tablet 0  . polyethylene glycol powder (GLYCOLAX/MIRALAX) powder 255 grams one bottle for colonoscopy prep 255 g 0  . ranitidine (ZANTAC) 300 MG tablet TAKE ONE TABLET BY MOUTH AT BEDTIME (REPLACES OMEPRAZOLE) 30 tablet 11  . simvastatin (ZOCOR) 10 MG tablet Take 1 tablet (10 mg total) by mouth daily. 90 tablet 1  . vitamin B-12 (CYANOCOBALAMIN) 1000 MCG tablet Take 1,000 mcg by mouth daily.    Marland Kitchen losartan (COZAAR) 25 MG tablet Take 1 tablet (25 mg total) by mouth daily. 25 tablet 5   No current facility-administered medications for this visit.    Facility-Administered Medications Ordered in Other Visits  Medication Dose Route Frequency Provider Last Rate Last Dose  . sodium chloride 0.9 % injection 10 mL  10 mL Intracatheter PRN Lloyd Huger, MD   10 mL at 11/30/14 1205    Review of Systems Review of Systems  Constitutional: Negative.   Respiratory: Negative.    Cardiovascular: Negative.     Blood pressure 130/72, pulse 66, resp. rate 12, height 5\' 7"  (1.702 m), weight 225 lb (102.1 kg), last menstrual period 08/26/2012.  Physical Exam Physical Exam No inflammation or tenderness at port site.  Data Reviewed Removal request reviewed.  Assessment    Completion of adjuvant her 2 neu therapy.     Plan     The procedure was reviewed with the  patient. The skin was cleansed with alcohol followed by the instillation of 10 mL of 0.5% Xylocaine with 0.25% Marcaine with 1-200,000 of epinephrine. The skin was cleansed with ChloraPrep and draped. The original incision was opened. The port was identified and both transfixion sutures of Prolene removed. The port was removed from its implant cavity without difficulty. The entire catheter tip was removed. No bleeding was noted. The deep tissue was approximated with a running 3-0 Vicryl suture. The skin was closed with a running 3-0 Vicryl septic suture. Benzoin, Steri-Strips followed by Telfa and Tegaderm dressing applied.  The patient tolerated the procedure well.  Postoperative wound care instructions reviewed. Ice pack provided. The outer dressing may be removed in 2 days. Follow-up is scheduled for colonoscopy next month.          Robert Bellow 04/03/2016, 11:20 AM

## 2016-04-04 ENCOUNTER — Telehealth: Payer: Self-pay

## 2016-04-04 NOTE — Telephone Encounter (Signed)
Patient called and states that she is having a lot of itching and irritation from the Tegaderm covering for her port removal site. Patient advised to remove the dressing but to leave the steri strips in place. She may shower but instructed to pat area dry gently. I let her know if the irritation continues she may use Cortizone cream to the area but to keep the cream away from the actual incision. She expressed understanding and will call back if any further problems.

## 2016-04-10 ENCOUNTER — Telehealth: Payer: Self-pay

## 2016-04-10 ENCOUNTER — Ambulatory Visit: Payer: Medicaid Other | Admitting: *Deleted

## 2016-04-10 DIAGNOSIS — Z170 Estrogen receptor positive status [ER+]: Principal | ICD-10-CM

## 2016-04-10 DIAGNOSIS — C50912 Malignant neoplasm of unspecified site of left female breast: Secondary | ICD-10-CM

## 2016-04-10 NOTE — Telephone Encounter (Signed)
Patient called and reports that the port site is still itchy. She has been using Cortizone cream to the area daily. She also says it looks like it may be swollen. No pain reported or drainage. Patient instructed to come in to have the nurse look at the area.

## 2016-04-10 NOTE — Progress Notes (Signed)
Patient ID: Robin Howard, female   DOB: Apr 11, 1960, 56 y.o.   MRN: [id number]   Patient came in today for a wound check.  The wound is clean, with no signs of infection noted, She states she developed a small rash so she took the dressing off sooner. Minimal brusing. Follow up as scheduled.

## 2016-04-10 NOTE — Patient Instructions (Signed)
The patient is aware to call back for any questions or concerns.  

## 2016-04-11 ENCOUNTER — Encounter: Payer: Self-pay | Admitting: *Deleted

## 2016-04-12 ENCOUNTER — Other Ambulatory Visit: Payer: Self-pay | Admitting: Cardiovascular Disease

## 2016-04-12 MED ORDER — LOSARTAN POTASSIUM 25 MG PO TABS: 25.0000 mg | ORAL_TABLET | Freq: Every day | ORAL | 0 refills | Status: DC

## 2016-04-18 ENCOUNTER — Ambulatory Visit: Payer: Medicaid Other | Admitting: Certified Registered Nurse Anesthetist

## 2016-04-18 ENCOUNTER — Encounter: Payer: Self-pay | Admitting: *Deleted

## 2016-04-18 ENCOUNTER — Encounter
Admission: RE | Disposition: A | Discharge status: Home / Self Care | Payer: Self-pay | Source: Ambulatory Visit | Attending: General Surgery

## 2016-04-18 ENCOUNTER — Ambulatory Visit
Admission: RE | Admit: 2016-04-18 | Discharge: 2016-04-18 | Disposition: A | Discharge status: Home / Self Care | Payer: Medicaid Other | Source: Ambulatory Visit | Attending: General Surgery | Admitting: General Surgery

## 2016-04-18 DIAGNOSIS — G4700 Insomnia, unspecified: Secondary | ICD-10-CM | POA: Diagnosis not present

## 2016-04-18 DIAGNOSIS — Z79899 Other long term (current) drug therapy: Secondary | ICD-10-CM | POA: Insufficient documentation

## 2016-04-18 DIAGNOSIS — Z7982 Long term (current) use of aspirin: Secondary | ICD-10-CM | POA: Diagnosis not present

## 2016-04-18 DIAGNOSIS — I2510 Atherosclerotic heart disease of native coronary artery without angina pectoris: Secondary | ICD-10-CM | POA: Insufficient documentation

## 2016-04-18 DIAGNOSIS — Z853 Personal history of malignant neoplasm of breast: Secondary | ICD-10-CM | POA: Insufficient documentation

## 2016-04-18 DIAGNOSIS — Z9221 Personal history of antineoplastic chemotherapy: Secondary | ICD-10-CM | POA: Diagnosis not present

## 2016-04-18 DIAGNOSIS — Z6837 Body mass index (BMI) 37.0-37.9, adult: Secondary | ICD-10-CM | POA: Insufficient documentation

## 2016-04-18 DIAGNOSIS — D649 Anemia, unspecified: Secondary | ICD-10-CM | POA: Insufficient documentation

## 2016-04-18 DIAGNOSIS — E039 Hypothyroidism, unspecified: Secondary | ICD-10-CM | POA: Insufficient documentation

## 2016-04-18 DIAGNOSIS — F329 Major depressive disorder, single episode, unspecified: Secondary | ICD-10-CM | POA: Insufficient documentation

## 2016-04-18 DIAGNOSIS — K219 Gastro-esophageal reflux disease without esophagitis: Secondary | ICD-10-CM | POA: Insufficient documentation

## 2016-04-18 DIAGNOSIS — Z1211 Encounter for screening for malignant neoplasm of colon: Secondary | ICD-10-CM | POA: Insufficient documentation

## 2016-04-18 DIAGNOSIS — I10 Essential (primary) hypertension: Secondary | ICD-10-CM | POA: Insufficient documentation

## 2016-04-18 DIAGNOSIS — N3281 Overactive bladder: Secondary | ICD-10-CM | POA: Insufficient documentation

## 2016-04-18 DIAGNOSIS — G709 Myoneural disorder, unspecified: Secondary | ICD-10-CM | POA: Insufficient documentation

## 2016-04-18 DIAGNOSIS — G4733 Obstructive sleep apnea (adult) (pediatric): Secondary | ICD-10-CM | POA: Insufficient documentation

## 2016-04-18 DIAGNOSIS — I252 Old myocardial infarction: Secondary | ICD-10-CM | POA: Diagnosis not present

## 2016-04-18 DIAGNOSIS — R51 Headache: Secondary | ICD-10-CM | POA: Insufficient documentation

## 2016-04-18 HISTORY — PX: COLONOSCOPY WITH PROPOFOL: SHX5780

## 2016-04-18 SURGERY — COLONOSCOPY WITH PROPOFOL
Anesthesia: General

## 2016-04-18 MED ORDER — SODIUM CHLORIDE 0.9 % IV SOLN
INTRAVENOUS | Status: DC
  Administered 2016-04-18: 1000 mL via INTRAVENOUS

## 2016-04-18 MED ORDER — PROPOFOL 10 MG/ML IV BOLUS
INTRAVENOUS | Status: DC | PRN
  Administered 2016-04-18: 20 mg via INTRAVENOUS
  Administered 2016-04-18: 50 mg via INTRAVENOUS

## 2016-04-18 MED ORDER — PROPOFOL 10 MG/ML IV BOLUS
INTRAVENOUS | Status: AC
  Filled 2016-04-18: qty 20

## 2016-04-18 MED ORDER — PROPOFOL 500 MG/50ML IV EMUL
INTRAVENOUS | Status: DC | PRN
  Administered 2016-04-18: 150 ug/kg/min via INTRAVENOUS

## 2016-04-18 MED ORDER — PROPOFOL 500 MG/50ML IV EMUL
INTRAVENOUS | Status: AC
  Filled 2016-04-18: qty 50

## 2016-04-18 NOTE — Anesthesia Post-op Follow-up Note (Cosign Needed)
Anesthesia QCDR form completed.        

## 2016-04-18 NOTE — Transfer of Care (Signed)
Immediate Anesthesia Transfer of Care Note  Patient: Robin Howard  Procedure(s) Performed: Procedure(s): COLONOSCOPY WITH PROPOFOL (N/A)  Patient Location: PACU  Anesthesia Type:General  Level of Consciousness: awake, alert  and oriented  Airway & Oxygen Therapy: Patient Spontanous Breathing and Patient connected to nasal cannula oxygen  Post-op Assessment: Report given to RN and Post -op Vital signs reviewed and stable  Post vital signs: Reviewed and stable  Last Vitals:  Vitals:   04/18/16 0832 04/18/16 0949  BP: 117/84 103/63  Pulse: 66 62  Resp: 18 17  Temp: 36.8 C 36.1 C    Last Pain:  Vitals:   04/18/16 0949  TempSrc: Temporal  PainSc: 0-No pain         Complications: No apparent anesthesia complications

## 2016-04-18 NOTE — Op Note (Signed)
[facility] Gastroenterology Patient Name: Robin Howard Procedure Date: 04/18/2016 9:14 AM MRN: [id number] Account #: [id number] Date of Birth: 05-23-1960 Admit Type: Outpatient Age: 56 Room: [facility] ENDO ROOM 1 Gender: Female Note Status: Finalized Procedure:            Colonoscopy Indications:          Screening for colorectal malignant neoplasm Providers:            Robert Bellow, MD Referring MD:         Arnetha Courser (Referring MD) Medicines:            Monitored Anesthesia Care Complications:        No immediate complications. Procedure:            Pre-Anesthesia Assessment:                       - Prior to the procedure, a History and Physical was                        performed, and patient medications, allergies and                        sensitivities were reviewed. The patient's tolerance of                        previous anesthesia was reviewed.                       - The risks and benefits of the procedure and the                        sedation options and risks were discussed with the                        patient. All questions were answered and informed                        consent was obtained.                       After obtaining informed consent, the colonoscope was                        passed under direct vision. Throughout the procedure,                        the patient's blood pressure, pulse, and oxygen                        saturations were monitored continuously. The                        Colonoscope was introduced through the anus and                        advanced to the the cecum, identified by appendiceal                        orifice and ileocecal valve. The colonoscopy was  performed without difficulty. The patient tolerated the                        procedure well. The quality of the bowel preparation                        was good. Findings:      The entire examined colon appeared normal  on direct and retroflexion       views. Impression:           - The entire examined colon is normal on direct and                        retroflexion views.                       - No specimens collected. Recommendation:       - Discharge patient to home [Means].                       - Repeat colonoscopy in 10 years for screening purposes. Procedure Code(s):    --- Professional ---                       [phone], Colonoscopy, flexible; diagnostic, including                        collection of specimen(s) by brushing or washing, when                        performed (separate procedure) Diagnosis Code(s):    --- Professional ---                       Z12.11, Encounter for screening for malignant neoplasm                        of colon CPT copyright 2016 American Medical Association. All rights reserved. The codes documented in this report are preliminary and upon coder review may  be revised to meet current compliance requirements. Robert Bellow, MD 04/18/2016 9:47:35 AM This report has been signed electronically. Number of Addenda: 0 Note Initiated On: 04/18/2016 9:14 AM Scope Withdrawal Time: 0 hours 11 minutes 13 seconds  Total Procedure Duration: 0 hours 23 minutes 17 seconds       [facility]

## 2016-04-18 NOTE — Anesthesia Preprocedure Evaluation (Signed)
Anesthesia Evaluation  Patient identified by MRN, date of birth, ID band Patient awake    Reviewed: Allergy & Precautions, H&P , NPO status , Patient's Chart, lab work & pertinent test results, reviewed documented beta blocker date and time   Airway Mallampati: II   Neck ROM: full    Dental  (+) Teeth Intact   Pulmonary neg pulmonary ROS, shortness of breath and with exertion, sleep apnea ,    Pulmonary exam normal        Cardiovascular hypertension, + angina with exertion + CAD and + Past MI  negative cardio ROS Normal cardiovascular exam Rhythm:regular Rate:Normal     Neuro/Psych  Headaches, PSYCHIATRIC DISORDERS  Neuromuscular disease negative neurological ROS  negative psych ROS   GI/Hepatic negative GI ROS, Neg liver ROS, GERD  Medicated,  Endo/Other  negative endocrine ROSHypothyroidism   Renal/GU negative Renal ROS  negative genitourinary   Musculoskeletal   Abdominal   Peds  Hematology negative hematology ROS (+) anemia ,   Anesthesia Other Findings Past Medical History: No date: Anemia     Comment: h/o 07/2014: Breast cancer ([place])     Comment: T1c,N0, ER/ PR negative, Her 2 neu positive.               Wide excision, SLN, Mammosite, adjuvant               chemotherapy.  04-2014: Bronchitis No date: Chronic headache No date: Coronary artery disease     Comment:  small non-ST elevation myocardial infarction               in April 2016 in January 2017. Cardiac               catheterization in April 2016 showed mild               nonobstructive disease affecting the LAD with               tortuous vessels. CTA in 2017 showed only 30%               disease in the LAD. 1997: Detached retina No date: Family history of adverse reaction to anesthes*     Comment: pts dad has pseudocholinestrace deficieny No date: Gastritis No date: GERD (gastroesophageal reflux disease) No date: HTN (hypertension) No date:  Hypothyroidism No date: Insomnia No date: Last menstrual period (LMP) > 10 days ago     Comment: LMP 2014 No date: Major depressive disorder, recurrent ([place]) No date: Migraines No date: Morbid obesity ([place]) 04/16/14: NSTEMI (non-ST elevated myocardial infarction)*     Comment: Due to demand- normal Cath 04/19/14 01-24-15: NSTEMI (non-ST elevated myocardial infarction)* 03/04/2016: Obesity (BMI 35.0-39.9 without comorbidity) No date: OSA (obstructive sleep apnea)     Comment: untreated due to lack of insurance No date: Overactive bladder 1997: Pericarditis 1997: Pericarditis No date: Shortness of breath dyspnea     Comment: with exertion only Past Surgical History: No date: BACK SURGERY     Comment: L4-5 07/2014: BREAST BIOPSY Left     Comment: [facility] 12/13/2014: BREAST CYST ASPIRATION Left     Comment: 3:00 position 2 cmfn, fat necrosis per Dr.               Dwyane Luo notes 08/13/2014: BREAST LUMPECTOMY WITH SENTINEL LYMPH NODE BIO* Left     Comment: Procedure: BREAST LUMPECTOMY WITH SENTINEL               LYMPH NODE BIOPSY;  Surgeon:  Robert Bellow,              MD;  Location: [facility] ORS;  Service: General;                Laterality: Left; 08/30/2014: BREAST MAMMOSITE Left     Comment: Procedure: MAMMOSITE BREAST;  Surgeon: Robert Bellow, MD;  Location: [facility] ORS;  Service:               General;  Laterality: Left; No date: CESAREAN SECTION April 2016: CORONARY ANGIOPLASTY 10/16/2015: EYE SURGERY Left     Comment: retina repair No date: FOOT SURGERY No date: KNEE SURGERY 08/30/2014: PORTACATH PLACEMENT Right     Comment: Procedure: INSERTION PORT-A-CATH;  Surgeon:               Robert Bellow, MD;  Location: [facility] ORS;                Service: General;  Laterality: Right; 1997: RETINAL DETACHMENT SURGERY Right BMI    Body Mass Index:  37.44 kg/m     Reproductive/Obstetrics negative OB ROS                             Anesthesia  Physical Anesthesia Plan  ASA: IV  Anesthesia Plan: General   Post-op Pain Management:    Induction:   Airway Management Planned:   Additional Equipment:   Intra-op Plan:   Post-operative Plan:   Informed Consent: I have reviewed the patients History and Physical, chart, labs and discussed the procedure including the risks, benefits and alternatives for the proposed anesthesia with the patient or authorized representative who has indicated his/her understanding and acceptance.   Dental Advisory Given  Plan Discussed with: CRNA  Anesthesia Plan Comments:         Anesthesia Quick Evaluation

## 2016-04-18 NOTE — H&P (Signed)
Robin Howard [id number] January 05, 1961     HPI:  56 y.o woman for screening colonoscopy.   Prescriptions Prior to Admission  Medication Sig Dispense Refill Last Dose  . acetaminophen (TYLENOL) 500 MG tablet Take 1,000 mg by mouth every 6 (six) hours as needed for mild pain or headache.   Taking  . ascorbic acid (VITAMIN C) 250 MG CHEW Chew 250 mg by mouth daily.   Taking  . aspirin EC 81 MG tablet Take 81 mg by mouth daily.   04/18/2016 at 0630  . CALCIUM PO Take 1 tablet by mouth 2 (two) times daily.    Taking  . cholecalciferol (VITAMIN D) 1000 UNITS tablet Take 1,000 Units by mouth daily.   Taking  . DULoxetine (CYMBALTA) 60 MG capsule Take 1 capsule (60 mg total) by mouth daily. 90 capsule 2 04/17/2016 at Unknown time  . levothyroxine (SYNTHROID, LEVOTHROID) 100 MCG tablet Take 1 tablet (100 mcg total) by mouth daily. 30 tablet 1 04/17/2016 at Unknown time  . loratadine (CLARITIN) 10 MG tablet Take 10 mg by mouth daily.   04/17/2016 at Unknown time  . losartan (COZAAR) 25 MG tablet Take 1 tablet (25 mg total) by mouth daily. 90 tablet 0 04/17/2016 at Unknown time  . metoprolol succinate (TOPROL-XL) 100 MG 24 hr tablet Take 1 tablet (100 mg total) by mouth daily. Take with or immediately following a meal. 90 tablet 1 04/17/2016 at Unknown time  . Multiple Vitamin (MULTIVITAMIN WITH MINERALS) TABS tablet Take 1 tablet by mouth daily.   04/17/2016 at Unknown time  . Naproxen Sodium (ALEVE PO) Take by mouth.   Taking  . neomycin-polymyxin-pramoxine (NEOSPORIN PLUS) 1 % cream Apply topically 2 (two) times daily. 14.2 g 0 Taking  . ranitidine (ZANTAC) 300 MG tablet TAKE ONE TABLET BY MOUTH AT BEDTIME (REPLACES OMEPRAZOLE) 30 tablet 11 04/17/2016 at Unknown time  . simvastatin (ZOCOR) 10 MG tablet Take 1 tablet (10 mg total) by mouth daily. 90 tablet 1 04/17/2016 at Unknown time  . vitamin B-12 (CYANOCOBALAMIN) 1000 MCG tablet Take 1,000 mcg by mouth daily.   04/17/2016 at Unknown time  . lidocaine (XYLOCAINE) 2 %  solution Use as directed 5 mLs in the mouth or throat every 6 (six) hours as needed for mouth pain. 100 mL 0 Taking  . oxyCODONE-acetaminophen (ROXICET) 5-325 MG tablet Take 1 tablet by mouth every 6 (six) hours as needed for moderate pain. (Patient not taking: Reported on 04/18/2016) 12 tablet 0 Not Taking at Unknown time  . polyethylene glycol powder (GLYCOLAX/MIRALAX) powder 255 grams one bottle for colonoscopy prep 255 g 0 Taking   Allergies  Allergen Reactions  . Reglan [Metoclopramide] Shortness Of Breath and Other (See Comments)    This medication caused a dystonic reaction.    Marland Kitchen Paxil [Paroxetine Hcl] Other (See Comments)    Reaction:  Blurred vision  . Tegaderm Ag Mesh [Silver] Rash  . Ultram [Tramadol] Itching and Rash   Past Medical History:  Diagnosis Date  . Anemia    h/o  . Breast cancer ([place]) 07/2014   T1c,N0, ER/ PR negative, Her 2 neu positive. Wide excision, SLN, Mammosite, adjuvant chemotherapy.   . Bronchitis 04-2014  . Chronic headache   . Coronary artery disease     small non-ST elevation myocardial infarction in April 2016 in January 2017. Cardiac catheterization in April 2016 showed mild nonobstructive disease affecting the LAD with tortuous vessels. CTA in 2017 showed only 30% disease in the LAD.  Marland Kitchen  Detached retina 1997  . Family history of adverse reaction to anesthesia    pts dad has pseudocholinestrace deficieny  . Gastritis   . GERD (gastroesophageal reflux disease)   . HTN (hypertension)   . Hypothyroidism   . Insomnia   . Last menstrual period (LMP) > 10 days ago    LMP 2014  . Major depressive disorder, recurrent ([place])   . Migraines   . Morbid obesity ([place])   . NSTEMI (non-ST elevated myocardial infarction) ([place]) 04/16/14   Due to demand- normal Cath 04/19/14  . NSTEMI (non-ST elevated myocardial infarction) ([place]) 01-24-15  . Obesity (BMI 35.0-39.9 without comorbidity) 03/04/2016  . OSA (obstructive sleep apnea)    untreated due to lack of insurance  .  Overactive bladder   . Pericarditis 1997  . Pericarditis 1997  . Shortness of breath dyspnea    with exertion only   Past Surgical History:  Procedure Laterality Date  . BACK SURGERY     L4-5  . BREAST BIOPSY [facility] 07/2014   [facility]  . BREAST CYST ASPIRATION Left 12/13/2014   3:00 position 2 cmfn, fat necrosis per Dr. Dwyane Luo notes  . BREAST LUMPECTOMY WITH SENTINEL LYMPH NODE BIOPSY Left 08/13/2014   Procedure: BREAST LUMPECTOMY WITH SENTINEL LYMPH NODE BIOPSY;  Surgeon: Robert Bellow, MD;  Location: [facility] ORS;  Service: General;  Laterality: Left;  . BREAST MAMMOSITE Left 08/30/2014   Procedure: MAMMOSITE BREAST;  Surgeon: Robert Bellow, MD;  Location: [facility] ORS;  Service: General;  Laterality: Left;  . CESAREAN SECTION    . CORONARY ANGIOPLASTY  April 2016  . EYE SURGERY Left 10/16/2015   retina repair  . FOOT SURGERY    . KNEE SURGERY    . PORTACATH PLACEMENT Right 08/30/2014   Procedure: INSERTION PORT-A-CATH;  Surgeon: Robert Bellow, MD;  Location: [facility] ORS;  Service: General;  Laterality: Right;  . RETINAL DETACHMENT SURGERY Right 1997   Social History   Social History  . Marital status: Divorced    Spouse name: N/A  . Number of children: N/A  . Years of education: N/A   Occupational History  . Not on file.   Social History Main Topics  . Smoking status: Never Smoker  . Smokeless tobacco: Never Used  . Alcohol use No  . Drug use: No  . Sexual activity: Not Currently   Other Topics Concern  . Not on file   Social History Narrative  . No narrative on file   Social History   Social History Narrative  . No narrative on file     ROS: Negative.     PE: HEENT: Negative. Lungs: Clear. Cardio: RR.  Assessment/Plan:  Proceed with planned endoscopy.  Robert Bellow 04/18/2016

## 2016-04-19 ENCOUNTER — Encounter: Payer: Self-pay | Admitting: General Surgery

## 2016-04-19 NOTE — Anesthesia Postprocedure Evaluation (Signed)
Anesthesia Post Note  Patient: Robin Howard  Procedure(s) Performed: Procedure(s) (LRB): COLONOSCOPY WITH PROPOFOL (N/A)  Patient location during evaluation: PACU Anesthesia Type: General Level of consciousness: awake and alert Pain management: pain level controlled Vital Signs Assessment: post-procedure vital signs reviewed and stable Respiratory status: spontaneous breathing, nonlabored ventilation, respiratory function stable and patient connected to nasal cannula oxygen Cardiovascular status: blood pressure returned to baseline and stable Postop Assessment: no signs of nausea or vomiting Anesthetic complications: no     Last Vitals:  Vitals:   04/18/16 1010 04/18/16 1020  BP: 107/69 (!) 92/57  Pulse: (!) 58 (!) 54  Resp: 20 14  Temp:  36.6 C    Last Pain:  Vitals:   04/18/16 1020  TempSrc: Tympanic  PainSc:                  [name]

## 2016-05-01 ENCOUNTER — Inpatient Hospital Stay: Payer: Medicaid Other | Attending: Oncology

## 2016-05-03 ENCOUNTER — Other Ambulatory Visit: Payer: Self-pay | Admitting: Cardiovascular Disease

## 2016-05-17 ENCOUNTER — Encounter: Payer: Self-pay | Admitting: Family Medicine

## 2016-05-18 ENCOUNTER — Ambulatory Visit (INDEPENDENT_AMBULATORY_CARE_PROVIDER_SITE_OTHER): Payer: Medicaid Other | Admitting: Family Medicine

## 2016-05-18 ENCOUNTER — Encounter: Payer: Self-pay | Admitting: Family Medicine

## 2016-05-18 DIAGNOSIS — L710 Perioral dermatitis: Secondary | ICD-10-CM

## 2016-05-18 DIAGNOSIS — E039 Hypothyroidism, unspecified: Secondary | ICD-10-CM

## 2016-05-18 DIAGNOSIS — F330 Major depressive disorder, recurrent, mild: Secondary | ICD-10-CM

## 2016-05-18 MED ORDER — DOXYCYCLINE HYCLATE 100 MG PO TABS: 100.0000 mg | ORAL_TABLET | Freq: Two times a day (BID) | ORAL | 0 refills | Status: DC

## 2016-05-18 MED ORDER — BUPROPION HCL ER (SR) 150 MG PO TB12: ORAL_TABLET | ORAL | 0 refills | Status: DC

## 2016-05-18 MED ORDER — TETRACYCLINE HCL 500 MG PO CAPS: 500.0000 mg | ORAL_CAPSULE | Freq: Two times a day (BID) | ORAL | 0 refills | Status: DC

## 2016-05-18 MED ORDER — ERYTHROMYCIN 2 % EX OINT: TOPICAL_OINTMENT | CUTANEOUS | 0 refills | Status: DC

## 2016-05-18 MED ORDER — METRONIDAZOLE 1 % EX GEL: Freq: Every day | CUTANEOUS | 0 refills | Status: DC

## 2016-05-18 MED ORDER — LEVOTHYROXINE SODIUM 100 MCG PO TABS: 100.0000 ug | ORAL_TABLET | Freq: Every day | ORAL | 6 refills | Status: DC

## 2016-05-18 NOTE — Assessment & Plan Note (Signed)
Encouraged patient to take her dose every day; refills sent

## 2016-05-18 NOTE — Assessment & Plan Note (Signed)
Will add wellbutrin; continue cymbalta; call before f/u if needed; return in 4 weeks

## 2016-05-18 NOTE — Progress Notes (Addendum)
BP 124/76   Pulse 87   Temp 99.2 F (37.3 C) (Oral)   Resp 14   Wt 227 lb 4.8 oz (103.1 kg)   LMP 08/26/2012   SpO2 97%   BMI 37.82 kg/m    Subjective:    Patient ID: Robin Howard, female    DOB: [birth year], 56 y.o.   MRN: [id number]  HPI: Robin Howard is a 56 y.o. female  Chief Complaint  Patient presents with  . 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; situation is not as pleasant as it could be; does not want to get up in the morning; no motivation; no thoughts of self-harm; she has a list of counselors but has not had time to do that yet  Depression screen [facility] 2/9 05/18/2016 03/28/2016 02/28/2016 12/27/2015 09/22/2015  Decreased Interest 0 0 - 0 1  Down, Depressed, Hopeless '1 1 1 ' 0 1  PHQ - 2 Score '1 1 1 ' 0 2  Altered sleeping - - - - 0  Tired, decreased energy - - - - 1  Change in appetite - - - - 0  Feeling bad or failure about yourself  - - - - 1  Trouble concentrating - - - - 1  Moving slowly or fidgety/restless - - - - 0  Suicidal thoughts - - - - 0  PHQ-9 Score - - - - 5  Difficult doing work/chores - - - - Somewhat difficult   Relevant past medical, surgical, family and social history reviewed Past Medical History:  Diagnosis Date  . Anemia    h/o  . Breast cancer ([place]) 07/2014   T1c,N0, ER/ PR negative, Her 2 neu positive. Wide excision, SLN, Mammosite, adjuvant chemotherapy.   .  Bronchitis 04-2014  . Chronic headache   . Coronary artery disease     small non-ST elevation myocardial infarction in April 2016 in January 2017. Cardiac catheterization in April 2016 showed mild nonobstructive disease affecting the LAD with tortuous vessels. CTA in 2017 showed only 30% disease in the LAD.  Marland Kitchen Detached retina 1997  . Family history of adverse reaction to anesthesia    pts dad has pseudocholinestrace deficieny  . Gastritis   . GERD (gastroesophageal reflux disease)   . HTN (hypertension)   . Hypothyroidism   . Insomnia   . Last menstrual period (LMP) > 10 days ago    LMP 2014  . Major depressive disorder, recurrent ([place])   . Migraines   . Morbid obesity ([place])   . NSTEMI (non-ST elevated myocardial infarction) ([place]) 04/16/14   Due to demand- normal Cath 04/19/14  . NSTEMI (non-ST elevated myocardial infarction) ([place]) 01-24-15  . Obesity (BMI 35.0-39.9 without comorbidity) 03/04/2016  . OSA (obstructive sleep apnea)    untreated due to lack of insurance  . Overactive bladder   . Pericarditis 1997  . Pericarditis  1997  . Shortness of breath dyspnea    with exertion only   Past Surgical History:  Procedure Laterality Date  . BACK SURGERY     L4-5  . BREAST [facility]OPSY [facility] 07/2014   [facility]  . BREAST CYST ASPIRATION Left 12/13/2014   3:00 position 2 cmfn, fat necrosis per Dr. Dwyane Luo notes  . BREAST LUMPECTOMY WITH SENTINEL LYMPH NODE BIOPSY Left 08/13/2014   Procedure: BREAST LUMPECTOMY WITH SENTINEL LYMPH NODE BIOPSY;  Surgeon: Robert Bellow, MD;  Location: [facility] ORS;  Service: General;  Laterality: Left;  . BREAST MAMMOSITE Left 08/30/2014   Procedure: MAMMOSITE BREAST;  Surgeon: Robert Bellow, MD;  Location: [facility] ORS;  Service: General;  Laterality: Left;  . CESAREAN SECTION    . COLONOSCOPY WITH PROPOFOL N/A 04/18/2016   Procedure: COLONOSCOPY WITH PROPOFOL;  Surgeon: Robert Bellow, MD;  Location: [facility] ENDOSCOPY;  Service: Endoscopy;  Laterality: N/A;  . CORONARY  ANGIOPLASTY  April 2016  . EYE SURGERY Left 10/16/2015   retina repair  . FOOT SURGERY    . KNEE SURGERY    . PORTACATH PLACEMENT Right 08/30/2014   Procedure: INSERTION PORT-A-CATH;  Surgeon: Robert Bellow, MD;  Location: [facility] ORS;  Service: General;  Laterality: Right;  . RETINAL DETACHMENT SURGERY Right 1997   family history includes Alcohol abuse in her mother; Arrhythmia in her father; Breast cancer (age of onset: 49) in her sister; COPD in her mother; Cancer in her father, maternal uncle, and sister; Congestive Heart Failure in her father; Depression in her father; Diabetes in her other; Heart attack in her father; Heart disease (age of onset: 54) in her paternal grandfather; Heart failure in her father; Hypertension in her father and mother; Thyroid disease in her mother. Social History   Social History  . Marital status: Divorced    Spouse name: N/A  . Number of children: N/A  . Years of education: N/A   Occupational History  . Not on file.   Social History Main Topics  . Smoking status: Never Smoker  . Smokeless tobacco: Never Used  . Alcohol use No  . Drug use: No  . Sexual activity: Not Currently   Other Topics Concern  . Not on file   Social History Narrative  . No narrative on file   Interim medical history since last visit reviewed. Allergies and medications reviewed  Review of Systems Per HPI unless specifically indicated above     Objective:    BP 124/76   Pulse 87   Temp 99.2 F (37.3 C) (Oral)   Resp 14   Wt 227 lb 4.8 oz (103.1 kg)   LMP 08/26/2012   SpO2 97%   BMI 37.82 kg/m   Wt Readings from Last 3 Encounters:  05/18/16 227 lb 4.8 oz (103.1 kg)  04/18/16 225 lb (102.1 kg)  04/03/16 225 lb (102.1 kg)    Physical Exam  Constitutional: She appears well-developed and well-nourished. No distress.  obese  HENT:  Nose: No rhinorrhea.  Mouth/Throat: Oropharynx is clear and moist and mucous membranes are normal. No oropharyngeal exudate,  posterior oropharyngeal edema or posterior oropharyngeal erythema.  No sores, no blisters, no lesions  Cardiovascular: Normal rate.   Pulmonary/Chest: Effort normal.  Musculoskeletal: She exhibits no edema.  Skin:  Rash along the lips, upper and lower, erythematous; no vesicles; left and right sides affected equally; no involving of the nasolabial folds  Psychiatric: Her mood appears not anxious. Her affect is not blunt. She is  not slowed. She does not exhibit a depressed mood. She expresses no homicidal and no suicidal ideation.  Good eye contact with examiner; not despondent   Results for orders placed or performed in visit on 03/28/16  CMV abs, IgG+IgM (cytomegalovirus)  Result Value Ref Range   Cytomegalovirus Ab-IgG >10.00 (H) <0.60 U/mL   CMV IgM <30.00 <30.00 AU/mL  Epstein-Barr Virus VCA Antibody Panel  Result Value Ref Range   EBV VCA IgG >750.00 (H) U/mL   EBV VCA IgM <36.00 U/mL   EBV NA IgG 228.00 (H) U/mL   Interpretation SEE NOTE   COMPLETE METABOLIC PANEL WITH GFR  Result Value Ref Range   Sodium 139 135 - 146 mmol/L   Potassium 4.5 3.5 - 5.3 mmol/L   Chloride 104 98 - 110 mmol/L   CO2 28 20 - 31 mmol/L   Glucose, Bld 111 (H) 65 - 99 mg/dL   BUN 13 7 - 25 mg/dL   Creat 1.08 (H) 0.50 - 1.05 mg/dL   Total Bilirubin 0.9 0.2 - 1.2 mg/dL   Alkaline Phosphatase 69 33 - 130 U/L   AST 21 10 - 35 U/L   ALT 17 6 - 29 U/L   Total Protein 6.6 6.1 - 8.1 g/dL   Albumin 4.0 3.6 - 5.1 g/dL   Calcium 9.4 8.6 - 10.4 mg/dL   GFR, Est African American 67 >=60 mL/min   GFR, Est Non African American 58 (L) >=60 mL/min  CBC with Differential/Platelet  Result Value Ref Range   WBC 6.0 3.8 - 10.8 K/uL   RBC 4.34 3.80 - 5.10 MIL/uL   Hemoglobin 12.3 11.7 - 15.5 g/dL   HCT 38.3 35.0 - 45.0 %   MCV 88.2 80.0 - 100.0 fL   MCH 28.3 27.0 - 33.0 pg   MCHC 32.1 32.0 - 36.0 g/dL   RDW 14.7 11.0 - 15.0 %   Platelets 234 140 - 400 K/uL   MPV 10.6 7.5 - 12.5 fL   Neutro Abs 3,840 1,500 -  7,800 cells/uL   Lymphs Abs 1,380 850 - 3,900 cells/uL   Monocytes Absolute 540 200 - 950 cells/uL   Eosinophils Absolute 180 15 - 500 cells/uL   Basophils Absolute 60 0 - 200 cells/uL   Neutrophils Relative % 64 %   Lymphocytes Relative 23 %   Monocytes Relative 9 %   Eosinophils Relative 3 %   Basophils Relative 1 %   Smear Review Criteria for review not met       Assessment & Plan:   Problem List Items Addressed This Visit      Digestive   Perioral dermatitis    Reviewed Up-to-Date; will start topical and oral medications; will also have her stop bananas and kiwifruit and strawberries for 2 weeks just in case contributing, then reintroduce one at a time to make sure not allergic reaction; if not resolving, then will refer to dermatologist        Endocrine   Hypothyroidism    Encouraged patient to take her dose every day; refills sent      Relevant Medications   levothyroxine (SYNTHROID, LEVOTHROID) 100 MCG tablet     Other   Major depressive disorder, recurrent (HCC)    Will add wellbutrin; continue cymbalta; call before f/u if needed; return in 4 weeks      Relevant Medications   buPROPion (WELLBUTRIN SR) 150 MG 12 hr tablet      Follow up plan: No Follow-up on file.  An  after-visit summary was printed and given to the patient at [place].  Please see the patient instructions which may contain other information and recommendations beyond what is mentioned above in the assessment and plan.  Meds ordered this encounter  Medications  . tetracycline (ACHROMYCIN,SUMYCIN) 500 MG capsule    Sig: Take 1 capsule (500 mg total) by mouth 2 (two) times daily.    Dispense:  20 capsule    Refill:  0  . Erythromycin 2 % ointment    Sig: Apply to affected skin twice a day    Dispense:  25 g    Refill:  0    May substitute with closest vehicle if not available  . levothyroxine (SYNTHROID, LEVOTHROID) 100 MCG tablet    Sig: Take 1 tablet (100 mcg total) by mouth daily.     Dispense:  30 tablet    Refill:  6  . buPROPion (WELLBUTRIN SR) 150 MG 12 hr tablet    Sig: Take one pill by mouth every morning x 5 days, then one pill twice a day; take 2nd pill no later than 4 pm    Dispense:  60 tablet    Refill:  0    No orders of the defined types were placed in this encounter.  Insurance would not pay for TCN or E-mycin topical Changing antibiotics New Rxs sent

## 2016-05-18 NOTE — Assessment & Plan Note (Addendum)
Reviewed Up-to-Date; will start topical and oral medications; will also have her stop bananas and kiwifruit and strawberries for 2 weeks just in case contributing, then reintroduce one at a time to make sure not allergic reaction; if not resolving, then will refer to dermatologist

## 2016-05-18 NOTE — Patient Instructions (Addendum)
Let's start the new medicines Avoid strawberries, bananas, and kiwifruit for 2 weeks Okay to start back one at a time after you are cleared up If I can't get you cleared up, let me know and we can refer you to a dermatologist Add the Wellbutrin (bupropion) and return to see me in 4 weeks, call sooner if needed

## 2016-05-18 NOTE — Addendum Note (Signed)
Addended by: Gianluca Chhim, Satira Anis on: 05/18/2016 03:13 PM   Modules accepted: Orders

## 2016-05-29 ENCOUNTER — Ambulatory Visit: Payer: Medicaid Other | Admitting: Cardiovascular Disease

## 2016-06-13 ENCOUNTER — Encounter: Payer: Self-pay | Admitting: Family Medicine

## 2016-06-13 MED ORDER — BUPROPION HCL ER (XL) 300 MG PO TB24: 300.0000 mg | ORAL_TABLET | Freq: Every day | ORAL | 0 refills | Status: DC

## 2016-06-21 ENCOUNTER — Encounter: Payer: Self-pay | Admitting: Physician Assistant

## 2016-06-21 ENCOUNTER — Ambulatory Visit (INDEPENDENT_AMBULATORY_CARE_PROVIDER_SITE_OTHER): Payer: Medicaid Other | Admitting: Physician Assistant

## 2016-06-21 VITALS — BP 100/78 | HR 62 | Ht 65.5 in | Wt 227.0 lb

## 2016-06-21 DIAGNOSIS — E782 Mixed hyperlipidemia: Secondary | ICD-10-CM | POA: Diagnosis not present

## 2016-06-21 DIAGNOSIS — Z01818 Encounter for other preprocedural examination: Secondary | ICD-10-CM

## 2016-06-21 DIAGNOSIS — I2510 Atherosclerotic heart disease of native coronary artery without angina pectoris: Secondary | ICD-10-CM | POA: Diagnosis not present

## 2016-06-21 DIAGNOSIS — R079 Chest pain, unspecified: Secondary | ICD-10-CM

## 2016-06-21 DIAGNOSIS — I10 Essential (primary) hypertension: Secondary | ICD-10-CM

## 2016-06-21 MED ORDER — METOPROLOL SUCCINATE ER 25 MG PO TB24: 75.0000 mg | ORAL_TABLET | Freq: Every day | ORAL | 2 refills | Status: DC

## 2016-06-21 NOTE — Progress Notes (Signed)
Cardiology Office Note Date:  06/21/2016  Patient ID:  Robin, Howard January 18, 1960, MRN [id number] PCP:  Arnetha Courser, MD  Cardiologist:  Dr. Fletcher Anon, MD    Chief Complaint: Follow up nonobstructive CAD  History of Present Illness: Robin Howard is a 56 y.o. female with history of mild nonobstructive CAD by cardiac cath in 04/2014 with normal EF, breast cancer in 07/2015 s/p lumpectomy followed by chemotherapy, recently completing Herceptin in the fall of 2017. Also with history of HTN, HLD, hypothyroidism, and obesity who presents for routine follow up for mild nonobstructive CAD. Most recent MUGA scan showed stable EF of 56%. She was most recently seen by Dr. Fletcher Anon in 10/2015 for routine follow up and was doing well at that time. She underwent colonoscopy in 04/2016 that was normal.   Previously admitted to the hospital and 04/2014 with chest pain found to have mild troponin elevation with peak of 0.50. Cardiac catheterization done at that time by outside group apparently showed nonobstructive CAD (report not available for review at this time). She again presented to the hospital with recurrence of chest pain in 01/2015 and was found to have elevated troponin with peak of 1.02 and started on heparin drip. She was consulted on by her cardiologist at that time who recommended discontinuation of heparin drip, patient discharge, and outpatient nuclear stress testing. Per patient report she followed up with the stress test though we do not have results for review at this time. She also states she had what sounds to be a coronary artery CT with a self-reported "30% blockage."   Most recent lipid panel from 10/2015 showed LDL 75.   Overall, she is doing well today though does note an occasional intermittent sharp substernal chest pain that is not associated with exertion or rest. Pain is fairly short-lived and resolves without intervention. She does note some excessive sweating though this is not unusual for  her. No associated shortness of breath, palpitations, nausea, vomiting, dizziness, presyncope, or syncope. Pain does not feel similar to prior episodes in which she presented to the hospital for evaluation. Currently without symptoms.   Past Medical History:  Diagnosis Date  . Anemia    h/o  . Breast cancer ([place]) 07/2014   T1c,N0, ER/ PR negative, Her 2 neu positive. Wide excision, SLN, Mammosite, adjuvant chemotherapy.   . Bronchitis 04-2014  . Chronic headache   . Coronary artery disease     small non-ST elevation myocardial infarction in April 2016 in January 2017. Cardiac catheterization in April 2016 showed mild nonobstructive disease affecting the LAD with tortuous vessels. CTA in 2017 showed only 30% disease in the LAD.  Marland Kitchen Detached retina 1997  . Family history of adverse reaction to anesthesia    pts dad has pseudocholinestrace deficieny  . Gastritis   . GERD (gastroesophageal reflux disease)   . HTN (hypertension)   . Hypothyroidism   . Insomnia   . Last menstrual period (LMP) > 10 days ago    LMP 2014  . Major depressive disorder, recurrent ([place])   . Migraines   . Morbid obesity ([place])   . NSTEMI (non-ST elevated myocardial infarction) ([place]) 04/16/14   Due to demand- normal Cath 04/19/14  . NSTEMI (non-ST elevated myocardial infarction) ([place]) 01-24-15  . Obesity (BMI 35.0-39.9 without comorbidity) 03/04/2016  . OSA (obstructive sleep apnea)    untreated due to lack of insurance  . Overactive bladder   . Pericarditis 1997  . Pericarditis 1997  .  Shortness of breath dyspnea    with exertion only    Past Surgical History:  Procedure Laterality Date  . BACK SURGERY     L4-5  . BRE[facility]SY [facility]2014   [facility]  . BREAST CYST ASPIRATION Left 12/13/2014   3:00 position 2 cmfn, fat necrosis per Dr. Dwyane Luo notes  . BREAST LUMPECTOMY WITH SENTINEL LYMPH NODE BIOPSY Left 08/13/2014   Procedure: BREAST LUMPECTOMY WITH SENTINEL LYMPH NODE BIOPSY;  Surgeon: Robert Bellow, MD;   Location: [facility] ORS;  Service: General;  Laterality: Left;  . BREAST MAMMOSITE Left 08/30/2014   Procedure: MAMMOSITE BREAST;  Surgeon: Robert Bellow, MD;  Location: [facility] ORS;  Service: General;  Laterality: Left;  . CESAREAN SECTION    . COLONOSCOPY WITH PROPOFOL N/A 04/18/2016   Procedure: COLONOSCOPY WITH PROPOFOL;  Surgeon: Robert Bellow, MD;  Location: [facility] ENDOSCOPY;  Service: Endoscopy;  Laterality: N/A;  . CORONARY ANGIOPLASTY  April 2016  . EYE SURGERY Left 10/16/2015   retina repair  . FOOT SURGERY    . KNEE SURGERY    . PORTACATH PLACEMENT Right 08/30/2014   Procedure: INSERTION PORT-A-CATH;  Surgeon: Robert Bellow, MD;  Location: [facility] ORS;  Service: General;  Laterality: Right;  . RETINAL DETACHMENT SURGERY Right 1997    Current Outpatient Prescriptions  Medication Sig Dispense Refill  . acetaminophen (TYLENOL) 500 MG tablet Take 1,000 mg by mouth every 6 (six) hours as needed for mild pain or headache.    Marland Kitchen ascorbic acid (VITAMIN C) 250 MG CHEW Chew 250 mg by mouth daily.    Marland Kitchen aspirin EC 81 MG tablet Take 81 mg by mouth daily.    Marland Kitchen buPROPion (WELLBUTRIN XL) 300 MG 24 hr tablet Take 1 tablet (300 mg total) by mouth daily. 30 tablet 0  . CALCIUM PO Take 1 tablet by mouth 2 (two) times daily.     . cholecalciferol (VITAMIN D) 1000 UNITS tablet Take 1,000 Units by mouth daily.    . DULoxetine (CYMBALTA) 60 MG capsule Take 1 capsule (60 mg total) by mouth daily. 90 capsule 2  . levothyroxine (SYNTHROID, LEVOTHROID) 100 MCG tablet Take 1 tablet (100 mcg total) by mouth daily. 30 tablet 6  . loratadine (CLARITIN) 10 MG tablet Take 10 mg by mouth daily.    Marland Kitchen losartan (COZAAR) 25 MG tablet Take 1 tablet (25 mg total) by mouth daily. 90 tablet 0  . metroNIDAZOLE (METROGEL) 1 % gel Apply topically daily. 15 g 0  . Multiple Vitamin (MULTIVITAMIN WITH MINERALS) TABS tablet Take 1 tablet by mouth daily.    . Naproxen Sodium (ALEVE PO) Take by mouth.    . ranitidine (ZANTAC) 300  MG tablet TAKE ONE TABLET BY MOUTH AT BEDTIME (REPLACES OMEPRAZOLE) 30 tablet 11  . simvastatin (ZOCOR) 10 MG tablet TAKE ONE TABLET BY MOUTH ONCE DAILY 90 tablet 0  . vitamin B-12 (CYANOCOBALAMIN) 1000 MCG tablet Take 1,000 mcg by mouth daily.    . metoprolol succinate (TOPROL-XL) 25 MG 24 hr tablet Take 3 tablets (75 mg total) by mouth daily. Take with or immediately following a meal. 270 tablet 2   No current facility-administered medications for this visit.    Facility-Administered Medications Ordered in Other Visits  Medication Dose Route Frequency Provider Last Rate Last Dose  . sodium chloride 0.9 % injection 10 mL  10 mL Intracatheter PRN Robin Huger, MD   10 mL at 11/30/14 1205    Allergies:   Reglan [metoclopramide]; Paxil [  paroxetine hcl]; Tegaderm ag mesh [silver]; and Ultram [tramadol]   Social History:  The patient  reports that she has never smoked. She has never used smokeless tobacco. She reports that she does not drink alcohol or use drugs.   Family History:  The patient's family history includes Alcohol abuse in her mother; Arrhythmia in her father; Breast cancer (age of onset: 32) in her sister; COPD in her mother; Cancer in her father, maternal uncle, and sister; Congestive Heart Failure in her father; Depression in her father; Diabetes in her other; Heart attack in her father; Heart disease (age of onset: 68) in her paternal grandfather; Heart failure in her father; Hypertension in her father and mother; Thyroid disease in her mother.  ROS:   Review of Systems  Constitutional: Positive for malaise/fatigue. Negative for chills, diaphoresis, fever and weight loss.  HENT: Negative for congestion.   Eyes: Negative for discharge and redness.  Respiratory: Negative for cough, hemoptysis, sputum production, shortness of breath and wheezing.   Cardiovascular: Positive for chest pain. Negative for palpitations, orthopnea, claudication, leg swelling and PND.    Gastrointestinal: Negative for abdominal pain, blood in stool, heartburn, melena, nausea and vomiting.  Genitourinary: Negative for hematuria.  Musculoskeletal: Negative for falls and myalgias.  Skin: Negative for rash.  Neurological: Positive for weakness. Negative for dizziness, tingling, tremors, sensory change, speech change, focal weakness and loss of consciousness.  Endo/Heme/Allergies: Does not bruise/bleed easily.  Psychiatric/Behavioral: Negative for substance abuse. The patient is nervous/anxious.   All other systems reviewed and are negative.    PHYSICAL EXAM:  VS:  BP 100/78 (BP Location: Right Arm, Patient Position: Sitting, Cuff Size: Large)   Pulse 62   Ht 5' 5.5" (1.664 m)   Wt 227 lb (103 kg)   LMP 08/26/2012   BMI 37.20 kg/m  BMI: Body mass index is 37.2 kg/m.  Physical Exam  Constitutional: She is oriented to person, place, and time. She appears well-developed and well-nourished.  Overweight.   HENT:  Head: Normocephalic and atraumatic.  Eyes: Right eye exhibits no discharge. Left eye exhibits no discharge.  Neck: Normal range of motion. No JVD present.  Cardiovascular: Normal rate, regular rhythm, S1 normal, S2 normal and normal heart sounds.  Exam reveals no distant heart sounds, no friction rub, no midsystolic click and no opening snap.   No murmur heard. Pulmonary/Chest: Effort normal and breath sounds normal. No respiratory distress. She has no decreased breath sounds. She has no wheezes. She has no rales. She exhibits no tenderness.  Abdominal: Soft. She exhibits no distension. There is no tenderness.  Musculoskeletal: She exhibits no edema.  Neurological: She is alert and oriented to person, place, and time.  Skin: Skin is warm and dry. No cyanosis. Nails show no clubbing.  Psychiatric: She has a normal mood and affect. Her speech is normal and behavior is normal. Judgment and thought content normal.     EKG:  Was ordered and interpreted by me today.  Shows NSR, 62 bpm, low voltage QRS along precordial leads, nonspecific anterolateral st/t changes (improved from prior)  Recent Labs: 09/22/2015: Magnesium 2.0 02/28/2016: TSH 2.23 03/28/2016: ALT 17; BUN 13; Creat 1.08; Hemoglobin 12.3; Platelets 234; Potassium 4.5; Sodium 139  11/04/2015: Chol/HDL Ratio 3.4; Cholesterol, Total 148; HDL 43; LDL Calculated 75; Triglycerides 151   CrCl cannot be calculated (Patient's most recent lab result is older than the maximum 21 days allowed.).   Wt Readings from Last 3 Encounters:  06/21/16 227 lb (103 kg)  05/18/16 227 lb 4.8 oz (103.1 kg)  04/18/16 225 lb (102.1 kg)     Other studies reviewed: Additional studies/records reviewed today include: summarized above  ASSESSMENT AND PLAN:  1. Chest pain with moderate risk of cardiac etiology/nonobstructive CAD: Previously followed by Dr. Humphrey Rolls, MD who performed cardiac catheterization in April 2016 showing mild nonobstructive CAD. This was followed by hospitalization and 01/2015 for chest pain with troponin elevation of 1.02. Patient was seen by former cardiologist at that time and felt to not require cardiac catheterization. She did have an outpatient stress test at their office per her report (results are not available for review at this time). She also reports having what sounds like a CT coronary artery scan done through their office with a patient reported "blockage of 30% in one vein". She has noted intermittent brief episodes of sharp substernal chest pain without radiation. No associated shortness of breath though some excessive sweating. Currently symptom-free. Recommend we try to obtain prior records from former cardiologist as well as schedule patient for treadmill Myoview to evaluate for high-risk ischemia and compare these results to prior studies. Continue ASA 81 mg daily as well as beta blocker as below.  2. HTN: Blood pressure on the softer side today at 657 mmHg systolic. She is asymptomatic. Will  decrease Toprol-XL to 75 mg daily. Continue losartan 25 mg daily. If she continues to have systolic blood pressure could consider decreasing losartan to 12.5 mg daily.  3. HLD: Simvastatin. Followed by PCP.  Disposition: F/u with me in 1-2 weeks.   Current medicines are reviewed at length with the patient today.  The patient did not have any concerns regarding medicines.  Melvern Banker PA-C 06/21/2016 4:31 PM     [address], [place] [id number] [phone]

## 2016-06-21 NOTE — Patient Instructions (Addendum)
Medication Instructions:  Your physician has recommended you make the following change in your medication:  1- DECREASE Metoprolol succinate to 75 mg (3 tablets) by mouth once a day.   Labwork: none  Testing/Procedures: Your physician has requested that you have en exercise stress myoview. For further information please visit HugeFiesta[employer]tn. Please follow instruction sheet, as given.  Hope  Your caregiver has ordered a Stress Test with nuclear imaging. The purpose of this test is to evaluate the blood supply to your heart muscle. This procedure is referred to as a "Non-Invasive Stress Test." This is because other than having an IV started in your vein, nothing is inserted or "invades" your body. Cardiac stress tests are done to find areas of poor blood flow to the heart by determining the extent of coronary artery disease (CAD). Some patients exercise on a treadmill, which naturally increases the blood flow to your heart, while others who are  unable to walk on a treadmill due to physical limitations have a pharmacologic/chemical stress agent called Lexiscan . This medicine will mimic walking on a treadmill by temporarily increasing your coronary blood flow.   Please note: these test may take anywhere between 2-4 hours to complete  PLEASE REPORT TO [facility] AT THE FIRST DESK WILL DIRECT YOU WHERE TO GO  Date of Procedure:______06/13/18__________  Arrival Time for Procedure:______08:45 am_________  Instructions regarding medication:   _X_:  Hold METOPROLOL betablocker THE night before procedure and morning of procedure   PLEASE NOTIFY THE OFFICE AT LEAST 24 HOURS IN ADVANCE IF YOU ARE UNABLE TO KEEP YOUR APPOINTMENT.  [phone] AND  PLEASE NOTIFY NUCLEAR MEDICINE AT [facility] AT LEAST 24 HOURS IN ADVANCE IF YOU ARE UNABLE TO KEEP YOUR APPOINTMENT. [phone]  How to prepare for your Myoview test:  1. Do not eat or drink after  midnight 2. No caffeine for 24 hours prior to test 3. No smoking 24 hours prior to test. 4. Your medication may be taken with water.  If your doctor stopped a medication because of this test, do not take that medication. 5. Ladies, please do not wear dresses.  Skirts or pants are appropriate. Please wear a short sleeve shirt. 6. No perfume, cologne or lotion. 7. Wear comfortable walking shoes. No heels!       Follow-Up: Your physician recommends that you schedule a follow-up appointment in: 1-2 WEEKS AFTER STRESS TEST WITH RYAN OR DR ARIDA.  If you need a refill on your cardiac medications before your next appointment, please call your pharmacy.   Cardiac Nuclear Scan A cardiac nuclear scan is a test that measures blood flow to the heart when a person is resting and when he or she is exercising. The test looks for problems such as:  Not enough blood reaching a portion of the heart.  The heart muscle not working normally.  You may need this test if:  You have heart disease.  You have had abnormal lab results.  You have had heart surgery or angioplasty.  You have chest pain.  You have shortness of breath.  In this test, a radioactive dye (tracer) is injected into your bloodstream. After the tracer has traveled to your heart, an imaging device is used to measure how much of the tracer is absorbed by or distributed to various areas of your heart. This procedure is usually done at a hospital and takes 2-4 hours. Tell a health care provider about:  Any allergies you have.  All medicines you are taking, including vitamins, herbs, eye drops, creams, and over-the-counter medicines.  Any problems you or family members have had with the use of anesthetic medicines.  Any blood disorders you have.  Any surgeries you have had.  Any medical conditions you have.  Whether you are pregnant or may be pregnant. What are the risks? Generally, this is a safe procedure. However, problems  may occur, including:  Serious chest pain and heart attack. This is only a risk if the stress portion of the test is done.  Rapid heartbeat.  Sensation of warmth in your chest. This usually passes quickly.  What happens before the procedure?  Ask your health care provider about changing or stopping your regular medicines. This is especially important if you are taking diabetes medicines or blood thinners.  Remove your jewelry on the day of the procedure. What happens during the procedure?  An IV tube will be inserted into one of your veins.  Your health care provider will inject a small amount of radioactive tracer through the tube.  You will wait for 20-40 minutes while the tracer travels through your bloodstream.  Your heart activity will be monitored with an electrocardiogram (ECG).  You will lie down on an exam table.  Images of your heart will be taken for about 15-20 minutes.  You may be asked to exercise on a treadmill or stationary bike. While you exercise, your heart's activity will be monitored with an ECG, and your blood pressure will be checked. If you are unable to exercise, you may be given a medicine to increase blood flow to parts of your heart.  When blood flow to your heart has peaked, a tracer will again be injected through the IV tube.  After 20-40 minutes, you will get back on the exam table and have more images taken of your heart.  When the procedure is over, your IV tube will be removed. The procedure may vary among health care providers and hospitals. Depending on the type of tracer used, scans may need to be repeated 3-4 hours later. What happens after the procedure?  Unless your health care provider tells you otherwise, you may return to your normal schedule, including diet, activities, and medicines.  Unless your health care provider tells you otherwise, you may increase your fluid intake. This will help flush the contrast dye from your body. Drink  enough fluid to keep your urine clear or pale yellow.  It is up to you to get your test results. Ask your health care provider, or the department that is doing the test, when your results will be ready. Summary  A cardiac nuclear scan measures the blood flow to the heart when a person is resting and when he or she is exercising.  You may need this test if you are at risk for heart disease.  Tell your health care provider if you are pregnant.  Unless your health care provider tells you otherwise, increase your fluid intake. This will help flush the contrast dye from your body. Drink enough fluid to keep your urine clear or pale yellow. This information is not intended to replace advice given to you by your health care provider. Make sure you discuss any questions you have with your health care provider. Document Released: 01/27/2004 Document Revised: 01/04/2016 Document Reviewed: 12/10/2012 Elsevier Interactive Patient Education  2017 [employer].

## 2016-06-22 ENCOUNTER — Ambulatory Visit (INDEPENDENT_AMBULATORY_CARE_PROVIDER_SITE_OTHER): Payer: Medicaid Other | Admitting: Family Medicine

## 2016-06-22 ENCOUNTER — Encounter: Payer: Self-pay | Admitting: Family Medicine

## 2016-06-22 VITALS — BP 126/74 | HR 84 | Temp 97.7°F | Resp 14 | Ht 67.0 in | Wt 226.3 lb

## 2016-06-22 DIAGNOSIS — M542 Cervicalgia: Secondary | ICD-10-CM | POA: Diagnosis not present

## 2016-06-22 DIAGNOSIS — R0982 Postnasal drip: Secondary | ICD-10-CM | POA: Diagnosis not present

## 2016-06-22 DIAGNOSIS — F330 Major depressive disorder, recurrent, mild: Secondary | ICD-10-CM

## 2016-06-22 DIAGNOSIS — I209 Angina pectoris, unspecified: Secondary | ICD-10-CM

## 2016-06-22 DIAGNOSIS — C50512 Malignant neoplasm of lower-outer quadrant of left female breast: Secondary | ICD-10-CM

## 2016-06-22 DIAGNOSIS — I25119 Atherosclerotic heart disease of native coronary artery with unspecified angina pectoris: Secondary | ICD-10-CM

## 2016-06-22 DIAGNOSIS — Z170 Estrogen receptor positive status [ER+]: Secondary | ICD-10-CM | POA: Diagnosis not present

## 2016-06-22 DIAGNOSIS — E669 Obesity, unspecified: Secondary | ICD-10-CM

## 2016-06-22 DIAGNOSIS — M25551 Pain in right hip: Secondary | ICD-10-CM | POA: Diagnosis not present

## 2016-06-22 MED ORDER — FLUTICASONE PROPIONATE 50 MCG/ACT NA SUSP: 2.0000 | Freq: Every day | NASAL | 6 refills | Status: DC

## 2016-06-22 MED ORDER — BUPROPION HCL ER (XL) 150 MG PO TB24: 150.0000 mg | ORAL_TABLET | Freq: Every day | ORAL | 0 refills | Status: DC

## 2016-06-22 NOTE — Assessment & Plan Note (Signed)
Managed by Dr. Finnegan 

## 2016-06-22 NOTE — Patient Instructions (Addendum)
We'll have you see the orthopaedist about your neck and hip Start the nasal spray Try turmeric as a natural anti-inflammatory (for pain and arthritis). It comes in capsules where you buy aspirin and fish oil, but also as a spice where you buy pepper and garlic powder. Check out the information at familydoctor.org entitled "Nutrition for Weight Loss: What You Need to Know about Fad Diets" Try to lose between 1-2 pounds per week by taking in fewer calories and burning off more calories You can succeed by limiting portions, limiting foods dense in calories and fat, becoming more active, and drinking 8 glasses of water a day (64 ounces) Don't skip meals, especially breakfast, as skipping meals may alter your metabolism Do not use over-the-counter weight loss pills or gimmicks that claim rapid weight loss A healthy BMI (or body mass index) is between 18.5 and 24.9 You can calculate your ideal BMI at the [employer] website ClubMonetize.fr Wean off of the wellbutrin, dropping to 150 mg daily x 2 weeks, then stop Let's aim for ten pounds down by next visit

## 2016-06-22 NOTE — Progress Notes (Signed)
BP 126/74   Pulse 84   Temp 97.7 F (36.5 C) (Oral)   Resp 14   Ht _0  (1.702 m)   Wt 226 lb 4.8 oz (102.6 kg)   LMP 08/26/2012   SpO2 98%   BMI 35.44 kg/m    Subjective:    Patient ID: Robin Howard, female    DOB: 12-05-1960, 56 y.o.   MRN: [id number]  HPI: Robin Howard is a 56 y.o. female  Chief Complaint  Patient presents with  . Medication Refill  . Hip Pain    Starts at the right side and goes down to her leg.  . Neck Pain    sharp pains in her neck when she look up.  . Nasal Congestion     cough, little strachy throat, no fever     HPI Patient is here for medicine refills  Reviewed last labs; Cr 1.08, GFR 58; some water, not as much she should; herbal tea, no sodas Glucose 111 CBC was normal  She also has nasal congestion, cough, scratchy throat; denies fever; taking claritin every day; not sure if allergies; started a few days ago; cough really started today; lots of drainage; no ear problems; no rash or travel; little bit of sweating  She has neck pain and hip pain as well; using aleve; she is having pain in her neck when looking up; after looking down, then looking up, sharp pain in her neck that catches her breath; thinks a nerve is pinched; a few weeks; has had some numbing feeling in the arms; no crunching or cracking in the neck with flexion; also having right hip hurting, shoots down the leg; no loss of control of B/B  Alittle constipated, from the wellbutrin Eye sight is getting worse; was bothering her before the wellbutrin, but really aggrevating her, worse since starting wellbutrin; discussed effect, not much difference Lab Results  Component Value Date   HGBA1C 5.6 04/17/2014   Tries to do walking during her lunch; not able to exercise as much with hip bothering her  Dr. Idolina Primer checked her HDL and it was fine; she is on statin through cardiologist; he noticed something in her EKG and she is going to have a stress test next week  Depression  screen [facility] 2/9 06/22/2016 05/18/2016 03/28/2016 02/28/2016 12/27/2015  Decreased Interest 0 0 0 - 0  Down, Depressed, Hopeless 0 _1 0  PHQ - 2 Score 0 _2 0  Altered sleeping - - - - -  Tired, decreased energy - - - - -  Change in appetite - - - - -  Feeling bad or failure about yourself  - - - - -  Trouble concentrating - - - - -  Moving slowly or fidgety/restless - - - - -  Suicidal thoughts - - - - -  PHQ-9 Score - - - - -  Difficult doing work/chores - - - - -    Relevant past medical, surgical, family and social history reviewed Past Medical History:  Diagnosis Date  . Anemia    h/o  . Breast cancer ([place]) 07/2014   T1c,N0, ER/ PR negative, Her 2 neu positive. Wide excision, SLN, Mammosite, adjuvant chemotherapy.   . Bronchitis 04-2014  . Chronic headache   . Coronary artery disease     small non-ST elevation myocardial infarction in April 2016 in January 2017. Cardiac catheterization in April 2016 showed mild nonobstructive disease affecting the LAD with tortuous vessels.  CTA in 2017 showed only 30% disease in the LAD.  Marland Kitchen Detached retina 1997  . Family history of adverse reaction to anesthesia    pts dad has pseudocholinestrace deficieny  . Gastritis   . GERD (gastroesophageal reflux disease)   . HTN (hypertension)   . Hypothyroidism   . Insomnia   . Last menstrual period (LMP) > 10 days ago    LMP 2014  . Major depressive disorder, recurrent ([place])   . Migraines   . Morbid obesity ([place])   . NSTEMI (non-ST elevated myocardial infarction) ([place]) 04/16/14   Due to demand- normal Cath 04/19/14  . NSTEMI (non-ST elevated myocardial infarction) ([place]) 01-24-15  . Obesity (BMI 35.0-39.9 without comorbidity) 03/04/2016  . OSA (obstructive sleep apnea)    untreated due to lack of insurance  . Overactive bladder   . Pericarditis 1997  . Pericarditis 1997  . Shortness of breath dyspnea    with exertion only   Past Surgical History:  Procedure Laterality Date  . BACK SURGERY      L4-5  . BREAST BIOPSY Left 07/2014   [facility]  . BREAST CYST ASPIRATION Left 12/13/2014   3:00 position 2 cmfn, fat necrosis per Dr. Dwyane Luo notes  . BREAST LUMPECTOMY WITH SENTINEL LYMPH NODE BIOPSY Left 08/13/2014   Procedure: BREAST LUMPECTOMY WITH SENTINEL LYMPH NODE BIOPSY;  Surgeon: Robert Bellow, MD;  Location: [facility] ORS;  Service: General;  Laterality: Left;  . BREAST MAMMOSITE Left 08/30/2014   Procedure: MAMMOSITE BREAST;  Surgeon: Robert Bellow, MD;  Location: [facility] ORS;  Service: General;  Laterality: Left;  . CESAREAN SECTION    . COLONOSCOPY WITH PROPOFOL N/A 04/18/2016   Procedure: COLONOSCOPY WITH PROPOFOL;  Surgeon: Robert Bellow, MD;  Location: [facility] ENDOSCOPY;  Service: Endoscopy;  Laterality: N/A;  . CORONARY ANGIOPLASTY  April 2016  . EYE SURGERY Left 10/16/2015   retina repair  . FOOT SURGERY    . KNEE SURGERY    . PORTACATH PLACEMENT Right 08/30/2014   Procedure: INSERTION PORT-A-CATH;  Surgeon: Robert Bellow, MD;  Location: [facility] ORS;  Service: General;  Laterality: Right;  . RETINAL DETACHMENT SURGERY Right 1997   Family History  Problem Relation Age of Onset  . Alcohol abuse Mother   . Thyroid disease Mother   . Hypertension Mother   . COPD Mother   . Cancer Father        liposarcoma  . Congestive Heart Failure Father        V Tach  . Depression Father   . Hypertension Father   . Heart attack Father   . Arrhythmia Father   . Heart failure Father   . Cancer Maternal Uncle        Pancreatic cancer  . Heart disease Paternal Grandfather 74       MI  . Diabetes Other   . Cancer Sister        breast  . Breast cancer Sister 57   Social History   Social History  . Marital status: Divorced    Spouse name: N/A  . Number of children: N/A  . Years of education: N/A   Occupational History  . Not on file.   Social History Main Topics  . Smoking status: Never Smoker  . Smokeless tobacco: Never Used  . Alcohol use No  . Drug use: No  . Sexual  activity: Not Currently   Other Topics Concern  . Not on file   Social History Narrative  .  No narrative on file    Interim medical history since last visit reviewed. Allergies and medications reviewed  Review of Systems Per HPI unless specifically indicated above     Objective:    BP 126/74   Pulse 84   Temp 97.7 F (36.5 C) (Oral)   Resp 14   Ht _0  (1.702 m)   Wt 226 lb 4.8 oz (102.6 kg)   LMP 08/26/2012   SpO2 98%   BMI 35.44 kg/m   Wt Readings from Last 3 Encounters:  06/22/16 226 lb 4.8 oz (102.6 kg)  06/21/16 227 lb (103 kg)  05/18/16 227 lb 4.8 oz (103.1 kg)    Physical Exam  Constitutional: She appears well-developed and well-nourished. No distress.  HENT:  Head: Normocephalic and atraumatic.  Right Ear: Tympanic membrane and ear canal normal.  Left Ear: Tympanic membrane and ear canal normal.  Nose: Rhinorrhea present.  Mouth/Throat: Oropharynx is clear and moist and mucous membranes are normal.  Eyes: EOM are normal. No scleral icterus.  Neck: No thyromegaly present.  Cardiovascular: Normal rate, regular rhythm and normal heart sounds.   No murmur heard. Pulmonary/Chest: Effort normal and breath sounds normal. No respiratory distress. She has no wheezes.  Abdominal: Soft. Bowel sounds are normal. She exhibits no distension.  Musculoskeletal: She exhibits no edema.       Right hip: She exhibits decreased range of motion. She exhibits normal strength, no tenderness and no swelling.       Cervical back: She exhibits decreased range of motion and tenderness. She exhibits no swelling, no edema, no deformity and no spasm.  Neurological: She is alert. She exhibits normal muscle tone.  Skin: Skin is warm and dry. No pallor.  Psychiatric: She has a normal mood and affect. Her behavior is normal. Judgment and thought content normal.   Results for orders placed or performed in visit on 03/28/16  CMV abs, IgG+IgM (cytomegalovirus)  Result Value Ref Range    Cytomegalovirus Ab-IgG >10.00 (H) <0.60 U/mL   CMV IgM <30.00 <30.00 AU/mL  Epstein-Barr Virus VCA Antibody Panel  Result Value Ref Range   EBV VCA IgG >750.00 (H) U/mL   EBV VCA IgM <36.00 U/mL   EBV NA IgG 228.00 (H) U/mL   Interpretation SEE NOTE   COMPLETE METABOLIC PANEL WITH GFR  Result Value Ref Range   Sodium 139 135 - 146 mmol/L   Potassium 4.5 3.5 - 5.3 mmol/L   Chloride 104 98 - 110 mmol/L   CO2 28 20 - 31 mmol/L   Glucose, Bld 111 (H) 65 - 99 mg/dL   BUN 13 7 - 25 mg/dL   Creat 1.08 (H) 0.50 - 1.05 mg/dL   Total Bilirubin 0.9 0.2 - 1.2 mg/dL   Alkaline Phosphatase 69 33 - 130 U/L   AST 21 10 - 35 U/L   ALT 17 6 - 29 U/L   Total Protein 6.6 6.1 - 8.1 g/dL   Albumin 4.0 3.6 - 5.1 g/dL   Calcium 9.4 8.6 - 10.4 mg/dL   GFR, Est African American 67 >=60 mL/min   GFR, Est Non African American 58 (L) >=60 mL/min  CBC with Differential/Platelet  Result Value Ref Range   WBC 6.0 3.8 - 10.8 K/uL   RBC 4.34 3.80 - 5.10 MIL/uL   Hemoglobin 12.3 11.7 - 15.5 g/dL   HCT 38.3 35.0 - 45.0 %   MCV 88.2 80.0 - 100.0 fL   MCH 28.3 27.0 - 33.0 pg  MCHC 32.1 32.0 - 36.0 g/dL   RDW 14.7 11.0 - 15.0 %   Platelets 234 140 - 400 K/uL   MPV 10.6 7.5 - 12.5 fL   Neutro Abs 3,840 1,500 - 7,800 cells/uL   Lymphs Abs 1,380 850 - 3,900 cells/uL   Monocytes Absolute 540 200 - 950 cells/uL   Eosinophils Absolute 180 15 - 500 cells/uL   Basophils Absolute 60 0 - 200 cells/uL   Neutrophils Relative % 64 %   Lymphocytes Relative 23 %   Monocytes Relative 9 %   Eosinophils Relative 3 %   Basophils Relative 1 %   Smear Review Criteria for review not met       Assessment & Plan:   Problem List Items Addressed This Visit      Cardiovascular and Mediastinum   Coronary artery disease    Managed by cardiologist; encouraged weight loss      Angina pectoris ([place])    Managed by cardiologist; has stress test next week; continue aspirin        Other   Obesity (BMI 35.0-39.9 without  comorbidity)    Encouraged ongoing weight loss      Major depressive disorder, recurrent (HCC)    Decrease wellbutrin from 300 mg to 150 mg daily x 2 weeks, then stop; encouraged exercise as cardiologist allows, always start low and build up gradually      Relevant Medications   buPROPion (WELLBUTRIN XL) 150 MG 24 hr tablet   Breast cancer of lower-outer quadrant of left female breast ([place])    Managed by Dr. Grayland Ormond       Other Visit Diagnoses    Neck pain, acute    -  Primary   sharp pains, related to extension; refer to ortho   Relevant Orders   Ambulatory referral to Orthopedic Surgery   Hip pain, acute, right       refer to ortho   Relevant Orders   Ambulatory referral to Orthopedic Surgery   Postnasal drip       start nasal spray       Follow up plan: Return in about 12 weeks (around 09/14/2016).  An after-visit summary was printed and given to the patient at [facility].  Please see the patient instructions which may contain other information and recommendations beyond what is mentioned above in the assessment and plan.  Meds ordered this encounter  Medications  . buPROPion (WELLBUTRIN XL) 150 MG 24 hr tablet    Sig: Take 1 tablet (150 mg total) by mouth daily.    Dispense:  14 tablet    Refill:  0  . fluticasone (FLONASE) 50 MCG/ACT nasal spray    Sig: Place 2 sprays into both nostrils daily.    Dispense:  16 g    Refill:  6    Orders Placed This Encounter  Procedures  . Ambulatory referral to Orthopedic Surgery

## 2016-06-22 NOTE — Assessment & Plan Note (Signed)
Decrease wellbutrin from 300 mg to 150 mg daily x 2 weeks, then stop; encouraged exercise as cardiologist allows, always start low and build up gradually

## 2016-06-22 NOTE — Assessment & Plan Note (Signed)
Managed by cardiologist; has stress test next week; continue aspirin

## 2016-06-22 NOTE — Assessment & Plan Note (Signed)
Encouraged ongoing weight loss 

## 2016-06-22 NOTE — Assessment & Plan Note (Signed)
Managed by cardiologist; encouraged weight loss

## 2016-06-26 ENCOUNTER — Telehealth: Payer: Self-pay | Admitting: *Deleted

## 2016-06-26 NOTE — Telephone Encounter (Signed)
Called patient to remind her of Exercise myoview tomorrow, arrival at 08:45 am. Reviewed pre-procedural instructions as listed on office visit AVS from 06/21/16 and she verbalized understanding.

## 2016-06-27 ENCOUNTER — Ambulatory Visit: Payer: Medicaid Other | Admitting: Oncology

## 2016-06-27 ENCOUNTER — Ambulatory Visit
Admission: RE | Admit: 2016-06-27 | Discharge: 2016-06-27 | Disposition: A | Discharge status: Home / Self Care | Payer: Medicaid Other | Source: Ambulatory Visit | Attending: Physician Assistant | Admitting: Physician Assistant

## 2016-06-27 DIAGNOSIS — I2510 Atherosclerotic heart disease of native coronary artery without angina pectoris: Secondary | ICD-10-CM | POA: Diagnosis not present

## 2016-06-27 DIAGNOSIS — E782 Mixed hyperlipidemia: Secondary | ICD-10-CM | POA: Diagnosis not present

## 2016-06-27 DIAGNOSIS — I10 Essential (primary) hypertension: Secondary | ICD-10-CM | POA: Diagnosis not present

## 2016-06-27 DIAGNOSIS — R079 Chest pain, unspecified: Secondary | ICD-10-CM

## 2016-06-27 LAB — NM MYOCAR MULTI W/SPECT W/WALL MOTION / EF
CHL CUP NUCLEAR SRS: 0
CSEPHR: 53 %
LV dias vol: 75 mL (ref 46–106)
LVSYSVOL: 31 mL
Peak HR: 88 {beats}/min
Rest HR: 62 {beats}/min
SDS: 0
SSS: 0
TID: 0.88

## 2016-06-27 MED ORDER — TECHNETIUM TC 99M TETROFOSMIN IV KIT
13.0000 | PACK | Freq: Once | INTRAVENOUS | Status: AC | PRN
  Administered 2016-06-27: 14.4 via INTRAVENOUS

## 2016-06-27 MED ORDER — REGADENOSON 0.4 MG/5ML IV SOLN
0.4000 mg | Freq: Once | INTRAVENOUS | Status: AC
  Administered 2016-06-27: 0.4 mg via INTRAVENOUS

## 2016-06-27 MED ORDER — TECHNETIUM TC 99M TETROFOSMIN IV KIT
30.0000 | PACK | Freq: Once | INTRAVENOUS | Status: AC | PRN
  Administered 2016-06-27: 31.1 via INTRAVENOUS

## 2016-06-28 ENCOUNTER — Other Ambulatory Visit: Payer: Self-pay

## 2016-06-28 DIAGNOSIS — C50412 Malignant neoplasm of upper-outer quadrant of left female breast: Secondary | ICD-10-CM

## 2016-06-28 DIAGNOSIS — Z171 Estrogen receptor negative status [ER-]: Principal | ICD-10-CM

## 2016-06-29 ENCOUNTER — Emergency Department: Payer: Medicaid Other

## 2016-06-29 ENCOUNTER — Emergency Department
Admission: EM | Admit: 2016-06-29 | Discharge: 2016-06-29 | Disposition: A | Discharge status: Home / Self Care | Payer: Medicaid Other | Attending: Emergency Medicine | Admitting: Emergency Medicine

## 2016-06-29 ENCOUNTER — Encounter: Payer: Self-pay | Admitting: Emergency Medicine

## 2016-06-29 DIAGNOSIS — I2510 Atherosclerotic heart disease of native coronary artery without angina pectoris: Secondary | ICD-10-CM | POA: Insufficient documentation

## 2016-06-29 DIAGNOSIS — M79604 Pain in right leg: Secondary | ICD-10-CM | POA: Diagnosis present

## 2016-06-29 DIAGNOSIS — Z853 Personal history of malignant neoplasm of breast: Secondary | ICD-10-CM | POA: Insufficient documentation

## 2016-06-29 DIAGNOSIS — M5431 Sciatica, right side: Secondary | ICD-10-CM | POA: Insufficient documentation

## 2016-06-29 DIAGNOSIS — Z7982 Long term (current) use of aspirin: Secondary | ICD-10-CM | POA: Insufficient documentation

## 2016-06-29 DIAGNOSIS — Z79899 Other long term (current) drug therapy: Secondary | ICD-10-CM | POA: Insufficient documentation

## 2016-06-29 DIAGNOSIS — E039 Hypothyroidism, unspecified: Secondary | ICD-10-CM | POA: Diagnosis not present

## 2016-06-29 DIAGNOSIS — I10 Essential (primary) hypertension: Secondary | ICD-10-CM | POA: Insufficient documentation

## 2016-06-29 MED ORDER — LIDOCAINE 5 % EX PTCH: 1.0000 | MEDICATED_PATCH | Freq: Two times a day (BID) | CUTANEOUS | 0 refills | Status: DC

## 2016-06-29 MED ORDER — DICLOFENAC SODIUM 1 % TD GEL: 2.0000 g | Freq: Four times a day (QID) | TRANSDERMAL | 0 refills | Status: DC

## 2016-06-29 MED ORDER — LIDOCAINE 5 % EX PTCH
1.0000 | MEDICATED_PATCH | CUTANEOUS | Status: DC
  Administered 2016-06-29: 1 via TRANSDERMAL
  Filled 2016-06-29: qty 1

## 2016-06-29 MED ORDER — CYCLOBENZAPRINE HCL 5 MG PO TABS: 5.0000 mg | ORAL_TABLET | Freq: Three times a day (TID) | ORAL | 0 refills | Status: AC | PRN

## 2016-06-29 NOTE — ED Triage Notes (Signed)
Pt reports right leg pain for approximately one week. Pt reports pain starts at hip and shoots down her leg to her ankle. Pt ambulatory to triage with slow gait.

## 2016-06-29 NOTE — ED Provider Notes (Signed)
[facility] Emergency Department Provider Note  ____________________________________________  Time seen: Approximately 7:35 PM  I have reviewed the triage vital signs and the nursing notes.   HISTORY  Chief Complaint Leg Pain    HPI Robin Howard is a 56 y.o. female that presents to the emergency department with right leg pain for one week. Patient states that pain radiates from her right buttock into the front and back of her thigh down the front of her lower leg and around her ankle. Patient states that occasionally the pain is sharp and sometimes it is throbbing. [facility] for the same symptoms and was given hydrocodone and a steroid taper and it is not helping. Naproxen is not helping either. No trauma. She denies headache, shortness breath, chest pain, nausea, vomiting, abdominal pain, back pain, bowel or bladder dysfunction, saddle paresthesias, dysuria, urgency, frequency.   Past Medical History:  Diagnosis Date  . Anemia    h/o  . Breast cancer ([place]) 07/2014   T1c,N0, ER/ PR negative, Her 2 neu positive. Wide excision, SLN, Mammosite, adjuvant chemotherapy.   . Bronchitis 04-2014  . Chronic headache   . Coronary artery disease     small non-ST elevation myocardial infarction in April 2016 in January 2017. Cardiac catheterization in April 2016 showed mild nonobstructive disease affecting the LAD with tortuous vessels. CTA in 2017 showed only 30% disease in the LAD.  Marland Kitchen Detached retina 1997  . Family history of adverse reaction to anesthesia    pts dad has pseudocholinestrace deficieny  . Gastritis   . GERD (gastroesophageal reflux disease)   . HTN (hypertension)   . Hypothyroidism   . Insomnia   . Last menstrual period (LMP) > 10 days ago    LMP 2014  . Major depressive disorder, recurrent ([place])   . Migraines   . Morbid obesity ([place])   . NSTEMI (non-ST elevated myocardial infarction) ([place]) 04/16/14   Due to demand- normal  Cath 04/19/14  . NSTEMI (non-ST elevated myocardial infarction) ([place]) 01-24-15  . Obesity (BMI 35.0-39.9 without comorbidity) 03/04/2016  . OSA (obstructive sleep apnea)    untreated due to lack of insurance  . Overactive bladder   . Pericarditis 1997  . Pericarditis 1997  . Shortness of breath dyspnea    with exertion only    Patient Active Problem List   Diagnosis Date Noted  . Perioral dermatitis 05/18/2016  . Obesity (BMI 35.0-39.9 without comorbidity) 03/04/2016  . Hypocalcemia 09/22/2015  . Coronary artery disease   . NSTEMI (non-ST elevated myocardial infarction) ([place]) 01/25/2015  . Angina pectoris ([place]) 01/24/2015  . Elevated troponin 01/24/2015  . Colitis 09/23/2014  . Breast cancer of lower-outer quadrant of left female breast ([place]) 08/10/2014  . Migraine 07/15/2014  . Fatigue 07/15/2014  . Major depressive disorder, recurrent ([place])   . HTN (hypertension)   . Overactive bladder   . Hypothyroidism   . OSA (obstructive sleep apnea)   . GERD (gastroesophageal reflux disease)   . Chronic headache     Past Surgical History:  Procedure Laterality Date  . BACK SURGERY     L4-5  . BREAST BIOPSY Left 07/2014   [facility]  . BREAST CYST ASPIRATION Left 12/13/2014   3:00 position 2 cmfn, fat necrosis per Dr. Dwyane Luo notes  . BREAST LUMPECTOMY WITH SENTINEL LYMPH NODE BIOPSY Left 08/13/2014   Procedure: BREAST LUMPECTOMY WITH SENTINEL LYMPH NODE BIOPSY;  Surgeon: Robert Bellow, MD;  [facility]  ORS;  Service: General;  Laterality: Left;  . BREAST MAMMOSITE Left 08/30/2014   Procedure: MAMMOSITE BREAST;  Surgeon: Robert Bellow, MD;  Location: [facility] ORS;  Service: General;  Laterality: Left;  . CESAREAN SECTION    . COLONOSCOPY WITH PROPOFOL N/A 04/18/2016   Procedure: COLONOSCOPY WITH PROPOFOL;  Surgeon: Robert Bellow, MD;  Location: [facility] ENDOSCOPY;  Service: Endoscopy;  Laterality: N/A;  . CORONARY ANGIOPLASTY  April 2016  . EYE SURGERY Left 10/16/2015   retina repair   . FOOT SURGERY    . KNEE SURGERY    . PORTACATH PLACEMENT Right 08/30/2014   Procedure: INSERTION PORT-A-CATH;  Surgeon: Robert Bellow, MD;  Location: [facility] ORS;  Service: General;  Laterality: Right;  . RETINAL DETACHMENT SURGERY Right 1997    Prior to Admission medications   Medication Sig Start Date End Date Taking? Authorizing Provider  acetaminophen (TYLENOL) 500 MG tablet Take 1,000 mg by mouth every 6 (six) hours as needed for mild pain or headache.    [provider]  ascorbic acid (VITAMIN C) 250 MG CHEW Chew 250 mg by mouth daily.    [provider]  aspirin EC 81 MG tablet Take 81 mg by mouth daily.    [provider]  buPROPion (WELLBUTRIN XL) 150 MG 24 hr tablet Take 1 tablet (150 mg total) by mouth daily. 06/22/16   Lada, Satira Anis, MD  CALCIUM PO Take 1 tablet by mouth 2 (two) times daily.     [provider]  cholecalciferol (VITAMIN D) 1000 UNITS tablet Take 1,000 Units by mouth daily.    [provider]  cyclobenzaprine (FLEXERIL) 5 MG tablet Take 1 tablet (5 mg total) by mouth 3 (three) times daily as needed for muscle spasms. 06/29/16 07/06/16  Laban Emperor, PA-C  diclofenac sodium (VOLTAREN) 1 % GEL Apply 2 g topically 4 (four) times daily. 06/29/16   Laban Emperor, PA-C  DULoxetine (CYMBALTA) 60 MG capsule Take 1 capsule (60 mg total) by mouth daily. 03/30/16   Arnetha Courser, MD  fluticasone (FLONASE) 50 MCG/ACT nasal spray Place 2 sprays into both nostrils daily. 06/22/16   Lada, Satira Anis, MD  levothyroxine (SYNTHROID, LEVOTHROID) 100 MCG tablet Take 1 tablet (100 mcg total) by mouth daily. 05/18/16   Lada, Satira Anis, MD  lidocaine (LIDODERM) 5 % Place 1 patch onto the skin every 12 (twelve) hours. Remove & Discard patch within 12 hours or as directed by MD 06/29/16 06/29/17  Laban Emperor, PA-C  loratadine (CLARITIN) 10 MG tablet Take 10 mg by mouth daily.    [provider]  losartan (COZAAR) 25 MG tablet Take 1  tablet (25 mg total) by mouth daily. 04/12/16 07/11/16  Wellington Hampshire, MD  metoprolol succinate (TOPROL-XL) 25 MG 24 hr tablet Take 3 tablets (75 mg total) by mouth daily. Take with or immediately following a meal. 06/21/16 09/19/16  Dunn, Areta Haber, PA-C  metroNIDAZOLE (METROGEL) 1 % gel Apply topically daily. 05/18/16   Arnetha Courser, MD  Multiple Vitamin (MULTIVITAMIN WITH MINERALS) TABS tablet Take 1 tablet by mouth daily.    [provider]  Naproxen Sodium (ALEVE PO) Take by mouth.    [provider]  ranitidine (ZANTAC) 300 MG tablet TAKE ONE TABLET BY MOUTH AT BEDTIME (REPLACES OMEPRAZOLE) 02/22/16   Lada, Satira Anis, MD  simvastatin (ZOCOR) 10 MG tablet TAKE ONE TABLET BY MOUTH ONCE DAILY 05/03/16   Wellington Hampshire, MD  vitamin B-12 (CYANOCOBALAMIN) 1000 MCG  tablet Take 1,000 mcg by mouth daily.    [provider]    Allergies Reglan [metoclopramide]; Paxil [paroxetine hcl]; Tegaderm ag mesh [silver]; and Ultram [tramadol]  Family History  Problem Relation Age of Onset  . Alcohol abuse Mother   . Thyroid disease Mother   . Hypertension Mother   . COPD Mother   . Cancer Father        liposarcoma  . Congestive Heart Failure Father        V Tach  . Depression Father   . Hypertension Father   . Heart attack Father   . Arrhythmia Father   . Heart failure Father   . Cancer Maternal Uncle        Pancreatic cancer  . Heart disease Paternal Grandfather 55       MI  . Diabetes Other   . Cancer Sister        breast  . Breast cancer Sister 8    Social History Social History  Substance Use Topics  . Smoking status: Never Smoker  . Smokeless tobacco: Never Used  . Alcohol use No     Review of Systems  Constitutional: No fever/chills Cardiovascular: No chest pain. Respiratory: No SOB. Gastrointestinal: No abdominal pain.  No nausea, no vomiting.  Musculoskeletal: Positive for leg pain. Skin: Negative for rash, abrasions, lacerations,  ecchymosis. Neurological: Negative for headaches, numbness or tingling   ____________________________________________   PHYSICAL EXAM:  VITAL SIGNS: ED Triage Vitals  Enc Vitals Group     BP 06/29/16 1607 90/64     Pulse Rate 06/29/16 1607 70     Resp 06/29/16 1607 18     Temp 06/29/16 1607 97.9 F (36.6 C)     Temp Source 06/29/16 1607 Oral     SpO2 06/29/16 1607 98 %     Weight 06/29/16 1608 224 lb (101.6 kg)     Height 06/29/16 1608 5' 5.5" (1.664 m)     Head Circumference --      Peak Flow --      Pain Score 06/29/16 1607 9     Pain Loc --      Pain Edu? --      Excl. in [place]? --      Constitutional: Alert and oriented. Well appearing and in no acute distress. Eyes: Conjunctivae are normal. PERRL. EOMI. Head: Atraumatic. ENT:      Ears:      Nose: No congestion/rhinnorhea.      Mouth/Throat: Mucous membranes are moist.  Neck: No stridor. Cardiovascular: Normal rate, regular rhythm.  Good peripheral circulation. Respiratory: Normal respiratory effort without tachypnea or retractions. Lungs CTAB. Good air entry to the bases with no decreased or absent breath sounds. Musculoskeletal: Full range of motion to all extremities. No gross deformities appreciated. Tenderness to palpation over right buttocks. No tenderness to palpation over lumbar spine. Positive straight leg raise. Neurologic:  Normal speech and language. No gross focal neurologic deficits are appreciated.  Skin:  Skin is warm, dry and intact. No rash noted.      ____________________________________________   LABS (all labs ordered are listed, but only abnormal results are displayed)  Labs Reviewed - No data to display ____________________________________________  EKG   ____________________________________________  RADIOLOGY [name], personally viewed and evaluated these images (plain radiographs) as part of my medical decision making, as well as reviewing the written report by the  radiologist.  US Venous Img Lower Unilateral Right  Result Date: 06/29/2016 CLINICAL DATA:  Right lower extremity pain. EXAM: RIGHT LOWER EXTREMITY VENOUS DOPPLER ULTRASOUND TECHNIQUE: Gray-scale sonography with graded compression, as well as color Doppler and duplex ultrasound were performed to evaluate the lower extremity deep venous systems from the level of the common femoral vein and including the common femoral, femoral, profunda femoral, popliteal and calf veins including the posterior tibial, peroneal and gastrocnemius veins when visible. The superficial great saphenous vein was also interrogated. Spectral Doppler was utilized to evaluate flow at rest and with distal augmentation maneuvers in the common femoral, femoral and popliteal veins. COMPARISON:  None. FINDINGS: Contralateral Common Femoral Vein: Respiratory phasicity is normal and symmetric with the symptomatic side. No evidence of thrombus. Normal compressibility. Common Femoral Vein: No evidence of thrombus. Normal compressibility, respiratory phasicity and response to augmentation. Saphenofemoral Junction: No evidence of thrombus. Normal compressibility and flow on color Doppler imaging. Profunda Femoral Vein: No evidence of thrombus. Normal compressibility and flow on color Doppler imaging. Femoral Vein: No evidence of thrombus. Normal compressibility, respiratory phasicity and response to augmentation. Popliteal Vein: No evidence of thrombus. Normal compressibility, respiratory phasicity and response to augmentation. Calf Veins: No evidence of thrombus. Normal compressibility and flow on color Doppler imaging. Superficial Great Saphenous Vein: No evidence of thrombus. Normal compressibility and flow on color Doppler imaging. Venous Reflux:  None. Other Findings:  None. IMPRESSION: No evidence of DVT within the right lower extremity. Electronically Signed   By: Fidela Salisbury M.D.   On: 06/29/2016 18:21     ____________________________________________    PROCEDURES  Procedure(s) performed:    Procedures    Medications  lidocaine (LIDODERM) 5 % 1 patch (1 patch Transdermal Patch Applied 06/29/16 1931)     ____________________________________________   INITIAL IMPRESSION / ASSESSMENT AND PLAN / ED COURSE  Pertinent labs & imaging results that were available during my care of the patient were reviewed by me and considered in my medical decision making (see chart for details).  Review of the [place] CSRS was performed in accordance of the [place] prior to dispensing any controlled drugs.   Patient's diagnosis is consistent with sciatica. Vital signs and exam are reassuring. Ultrasound negative for blood clot. Patient just finished a steroid taper and has a prescription for hydrocodone. Patient will be discharged home with prescriptions for Flexeril, Voltaren gel, Lidoderm. Patient is to follow up with PCP as directed. Patient is given ED precautions to return to the ED for any worsening or new symptoms.     ____________________________________________  FINAL CLINICAL IMPRESSION(S) / ED DIAGNOSES  Final diagnoses:  Sciatica of right side      NEW MEDICATIONS STARTED DURING THIS VISIT:  Discharge Medication List as of 06/29/2016  7:39 PM    START taking these medications   Details  cyclobenzaprine (FLEXERIL) 5 MG tablet Take 1 tablet (5 mg total) by mouth 3 (three) times daily as needed for muscle spasms., Starting Fri 06/29/2016, Until Fri 07/06/2016, Print    diclofenac sodium (VOLTAREN) 1 % GEL Apply 2 g topically 4 (four) times daily., Starting Fri 06/29/2016, Print    lidocaine (LIDODERM) 5 % Place 1 patch onto the skin every 12 (twelve) hours. Remove & Discard patch within 12 hours or as directed by MD, Starting Fri 06/29/2016, Until Sat 06/29/2017, Print            This chart was dictated using voice recognition software/Dragon. Despite best efforts to proofread,  errors can occur which can change the meaning. Any change was purely unintentional.    Earleen Newport,  Genevie Cheshire 06/29/16 2109    Rudene Re, MD 06/29/16 [phone]

## 2016-06-29 NOTE — ED Notes (Signed)
Patient transported to Ultrasound 

## 2016-06-29 NOTE — ED Notes (Signed)

## 2016-07-02 ENCOUNTER — Encounter: Payer: Self-pay | Admitting: Family Medicine

## 2016-07-03 ENCOUNTER — Encounter: Payer: Self-pay | Admitting: Family Medicine

## 2016-07-03 NOTE — Telephone Encounter (Signed)
Can you call [facility] and see where they stand on the MRI? Patient has heard anything and is desperate. Then let her know. Back to me if needed. Thank you

## 2016-07-04 NOTE — Telephone Encounter (Signed)
Called [facility] the [place] location and spoke with a representive regarding pt MRI. He stated to me that her insurance denied her MRI due to the guidelines they go back for the spine. They are in the process trying to get the insurance to cover the MRI; this was updated in their system this morning. Spoke with the pt and explain to her the reason she has not herd anything yet. I also explain to her that once they get the finial word with the insurance they will call her. Pt understood.

## 2016-07-10 NOTE — Progress Notes (Signed)
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 She is having significant back pain which radiates down her right leg.  She otherwise feels well and is asymptomatic.  She has no other neurologic complaints. She denies any recent fevers. She has no chest pain or shortness of breath. She denies any nausea, vomiting, constipation, or diarrhea. She has no urinary complaints. Patient offers no further specific complaints today.  REVIEW OF SYSTEMS:   Review of Systems  Constitutional: Negative.  Negative for fever and malaise/fatigue.  HENT: Negative for congestion and sore throat.   Eyes: Negative for blurred vision and pain.  Respiratory: Negative for cough.   Cardiovascular: Negative.  Negative for chest pain.  Gastrointestinal: Negative for abdominal pain, diarrhea and nausea.  Genitourinary: Negative.   Musculoskeletal: Positive for back pain and joint pain.  Neurological: Negative for weakness and headaches.  Psychiatric/Behavioral: Positive for depression. The patient is not nervous/anxious.     As per HPI. Otherwise, a complete review of systems is negative.  PAST MEDICAL HISTORY: Past Medical History:  Diagnosis Date  . Anemia    h/o  . Breast cancer ([place]) 07/2014   T1c,N0, ER/ PR negative, Her 2 neu  positive. Wide excision, SLN, Mammosite, adjuvant chemotherapy.   . Bronchitis 04-2014  . Chronic headache   . Coronary artery disease     small non-ST elevation myocardial infarction in April 2016 in January 2017. Cardiac catheterization in April 2016 showed mild nonobstructive disease affecting the LAD with tortuous vessels. CTA in 2017 showed only 30% disease in the LAD.  Marland Kitchen Detached retina 1997  . Family history of adverse reaction to anesthesia    pts dad has pseudocholinestrace deficieny  . Gastritis   . GERD (gastroesophageal reflux disease)   . HTN (hypertension)   . Hypothyroidism   . Insomnia   . Last menstrual period (LMP) > 10 days ago    LMP 2014  . Major depressive disorder, recurrent ([place])   . Migraines   . Morbid obesity ([place])   . NSTEMI (non-ST elevated myocardial infarction) ([place]) 04/16/14   Due to demand- normal Cath 04/19/14  . NSTEMI (non-ST elevated myocardial infarction) ([place]) 01-24-15  . Obesity (BMI 35.0-39.9 without comorbidity) 03/04/2016  . OSA (obstructive sleep apnea)    untreated due to lack of insurance  . Overactive bladder   . Pericarditis 1997  . Pericarditis 1997  . Shortness of breath dyspnea    with exertion only    PAST SURGICAL HISTORY: Past Surgical History:  Procedure Laterality Date  . BACK SURGERY     L4-5  . BREAST BIOPSY Left 07/2014   [facility]  . BREAST CYST ASPIRATION Left 12/13/2014   3:00 position 2 cmfn, fat necrosis per Dr. Dwyane Luo notes  . BREAST LUMPECTOMY WITH SENTINEL LYMPH NODE BIOPSY Left 08/13/2014   Procedure: BREAST LUMPECTOMY WITH SENTINEL LYMPH NODE BIOPSY;  Surgeon: Dellis Filbert  Amedeo Kinsman, MD;  Location: [facility] ORS;  Service: General;  Laterality: Left;  . BREAST MAMMOSITE Left 08/30/2014   Procedure: MAMMOSITE BREAST;  Surgeon: Robert Bellow, MD;  Location: [facility] ORS;  Service: General;  Laterality: Left;  . CESAREAN SECTION    . COLONOSCOPY WITH PROPOFOL N/A 04/18/2016   Procedure: COLONOSCOPY WITH PROPOFOL;  Surgeon: Robert Bellow, MD;  Location[facility];  Service: Endoscopy;  Laterality: N/A;  . CORONARY ANGIOPLASTY  April 2016  . EYE SURGERY Left 10/16/2015   retina repair  . FOOT SURGERY    . KNEE SURGERY    . PORTACATH PLACEMENT Right 08/30/2014   Procedure: INSERTION PORT-A-CATH;  Surgeon: Robert Bellow, MD;  Location: [facility] ORS;  Service: General;  Laterality: Right;  . RETINAL DETACHMENT SURGERY Right 1997    FAMILY HISTORY Family History  Problem Relation Age of Onset  . Alcohol abuse Mother   . Thyroid disease Mother   . Hypertension Mother   . COPD Mother   . Cancer Father        liposarcoma  . Congestive Heart Failure Father        V Tach  . Depression Father   . Hypertension Father   . Heart attack Father   . Arrhythmia Father   . Heart failure Father   . Cancer Maternal Uncle        Pancreatic cancer  . Heart disease Paternal Grandfather 57       MI  . Diabetes Other   . Cancer Sister        breast  . Breast cancer Sister 23       ADVANCED DIRECTIVES:    HEALTH MAINTENANCE: Social History  Substance Use Topics  . Smoking status: Never Smoker  . Smokeless tobacco: Never Used  . Alcohol use No    Allergies  Allergen Reactions  . Reglan [Metoclopramide] Shortness Of Breath and Other (See Comments)    This medication caused a dystonic reaction.    Marland Kitchen Paxil [Paroxetine Hcl] Other (See Comments)    Reaction:  Blurred vision  . Tegaderm Ag Mesh [Silver] Rash  . Ultram [Tramadol] Itching and Rash    Current Outpatient Prescriptions  Medication Sig Dispense Refill  . acetaminophen (TYLENOL) 500 MG tablet Take 1,000 mg by mouth every 6 (six) hours as needed for mild pain or headache.    Marland Kitchen ascorbic acid (VITAMIN C) 250 MG CHEW Chew 250 mg by mouth daily.    Marland Kitchen aspirin EC 81 MG tablet Take 81 mg by mouth daily.    Marland Kitchen buPROPion (WELLBUTRIN XL) 150 MG 24 hr tablet Take 1 tablet (150 mg total) by mouth daily. 14 tablet 0  . CALCIUM PO Take 1 tablet by mouth 2 (two)  times daily.     . cholecalciferol (VITAMIN D) 1000 UNITS tablet Take 1,000 Units by mouth daily.    . diclofenac sodium (VOLTAREN) 1 % GEL Apply 2 g topically 4 (four) times daily. 100 g 0  . DULoxetine (CYMBALTA) 60 MG capsule Take 1 capsule (60 mg total) by mouth daily. 90 capsule 2  . fluticasone (FLONASE) 50 MCG/ACT nasal spray Place 2 sprays into both nostrils daily. 16 g 6  . levothyroxine (SYNTHROID, LEVOTHROID) 100 MCG tablet Take 1 tablet (100 mcg total) by mouth daily. 30 tablet 6  . lidocaine (LIDODERM) 5 % Place 1 patch onto the skin every 12 (twelve) hours. Remove & Discard patch within 12 hours or as directed by MD  10 patch 0  . loratadine (CLARITIN) 10 MG tablet Take 10 mg by mouth daily.    Marland Kitchen losartan (COZAAR) 25 MG tablet TAKE 1 TABLET BY MOUTH ONCE DAILY 90 tablet 1  . metoprolol succinate (TOPROL-XL) 25 MG 24 hr tablet Take 3 tablets (75 mg total) by mouth daily. Take with or immediately following a meal. 270 tablet 2  . metroNIDAZOLE (METROGEL) 1 % gel Apply topically daily. 15 g 0  . Multiple Vitamin (MULTIVITAMIN WITH MINERALS) TABS tablet Take 1 tablet by mouth daily.    . Naproxen Sodium (ALEVE PO) Take by mouth.    . ranitidine (ZANTAC) 300 MG tablet TAKE ONE TABLET BY MOUTH AT BEDTIME (REPLACES OMEPRAZOLE) 30 tablet 11  . simvastatin (ZOCOR) 10 MG tablet TAKE ONE TABLET BY MOUTH ONCE DAILY 90 tablet 0  . vitamin B-12 (CYANOCOBALAMIN) 1000 MCG tablet Take 1,000 mcg by mouth daily.     No current facility-administered medications for this visit.    Facility-Administered Medications Ordered in Other Visits  Medication Dose Route Frequency Provider Last Rate Last Dose  . sodium chloride 0.9 % injection 10 mL  10 mL Intracatheter PRN Lloyd Huger, MD   10 mL at 11/30/14 1205    OBJECTIVE: Vitals:   07/11/16 1513  BP: 132/88  Pulse: 74  Resp: 20  Temp: (!) 96.4 F (35.8 C)     Body mass index is 37.15 kg/m.    ECOG FS:0 - Asymptomatic  General:  Well-developed, well-nourished, no acute distress. Eyes: Pink conjunctiva, anicteric sclera. Breasts: Exam deferred today. Lungs: Clear to auscultation bilaterally. Heart: Regular rate and rhythm. No rubs, murmurs, or gallops. Abdomen: Soft, nontender, nondistended. No organomegaly noted, normoactive bowel sounds. Musculoskeletal: No edema, cyanosis, or clubbing. Neuro: Alert, answering all questions appropriately. Cranial nerves grossly intact. Skin: No rashes or petechiae noted. Psych: Normal affect.   LAB RESULTS:  Lab Results  Component Value Date   NA 139 03/28/2016   K 4.5 03/28/2016   CL 104 03/28/2016   CO2 28 03/28/2016   GLUCOSE 111 (H) 03/28/2016   BUN 13 03/28/2016   CREATININE 1.08 (H) 03/28/2016   CALCIUM 9.4 03/28/2016   PROT 6.6 03/28/2016   ALBUMIN 4.0 03/28/2016   AST 21 03/28/2016   ALT 17 03/28/2016   ALKPHOS 69 03/28/2016   BILITOT 0.9 03/28/2016   GFRNONAA 58 (L) 03/28/2016   GFRAA 67 03/28/2016    Lab Results  Component Value Date   WBC 6.0 03/28/2016   NEUTROABS 3,840 03/28/2016   HGB 12.3 03/28/2016   HCT 38.3 03/28/2016   MCV 88.2 03/28/2016   PLT 234 03/28/2016     STUDIES: Nm Myocar Multi W/spect W/wall Motion / Ef  Result Date: 06/27/2016 Pharmacological myocardial perfusion imaging study with no significant ischemia Normal wall motion, EF estimated at 54% No EKG changes concerning for ischemia at peak stress or in recovery. Nonspecific ST and T wave abnormality at rest Low risk scan Signed, Esmond Plants, MD, Ph.D [facility]   US Venous Img Lower Unilateral Right  Result Date: 06/29/2016 CLINICAL DATA:  Right lower extremity pain. EXAM: RIGHT LOWER EXTREMITY VENOUS DOPPLER ULTRASOUND TECHNIQUE: Gray-scale sonography with graded compression, as well as color Doppler and duplex ultrasound were performed to evaluate the lower extremity deep venous systems from the level of the common femoral vein and including the common femoral,  femoral, profunda femoral, popliteal and calf veins including the posterior tibial, peroneal and gastrocnemius veins when visible. The superficial great saphenous  vein was also interrogated. Spectral Doppler was utilized to evaluate flow at rest and with distal augmentation maneuvers in the common femoral, femoral and popliteal veins. COMPARISON:  None. FINDINGS: Contralateral Common Femoral Vein: Respiratory phasicity is normal and symmetric with the symptomatic side. No evidence of thrombus. Normal compressibility. Common Femoral Vein: No evidence of thrombus. Normal compressibility, respiratory phasicity and response to augmentation. Saphenofemoral Junction: No evidence of thrombus. Normal compressibility and flow on color Doppler imaging. Profunda Femoral Vein: No evidence of thrombus. Normal compressibility and flow on color Doppler imaging. Femoral Vein: No evidence of thrombus. Normal compressibility, respiratory phasicity and response to augmentation. Popliteal Vein: No evidence of thrombus. Normal compressibility, respiratory phasicity and response to augmentation. Calf Veins: No evidence of thrombus. Normal compressibility and flow on color Doppler imaging. Superficial Great Saphenous Vein: No evidence of thrombus. Normal compressibility and flow on color Doppler imaging. Venous Reflux:  None. Other Findings:  None. IMPRESSION: No evidence of DVT within the right lower extremity. Electronically Signed   By: Fidela Salisbury M.D.   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  PLAN:    1. 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 Patient's MUGA scan from July 18, 2015 reported an EF of 53%.  Given patient's persistent side effects, chemotherapy was discontinued altogether after 3 cycles. Patient  completed her year-long maintenance Herceptin on October 04, 2015. Her most recent mammogram on August 16, 2015 was reported as BI-RADS 2, repeat in August 2018.  Return to clinic in 6 months for routine evaluation. 2. Anemia: Mild, monitor. 3. Back pain: Continue treatment per PCP.  Patient expressed understanding and was in agreement with this plan. She also understands that She can call clinic at any time with any questions, concerns, or complaints.   Breast cancer, female   Staging form: Breast, AJCC 7th Edition     Pathologic stage from 08/17/2014: Stage IA (T1c, N0, cM0) - Signed by Lloyd Huger, MD on 08/17/2014  Lloyd Huger, MD   07/14/2016 11:58 PM

## 2016-07-11 ENCOUNTER — Other Ambulatory Visit: Payer: Self-pay | Admitting: Cardiovascular Disease

## 2016-07-11 ENCOUNTER — Inpatient Hospital Stay: Payer: Medicaid Other | Attending: Oncology | Admitting: Oncology

## 2016-07-11 VITALS — BP 132/88 | HR 74 | Temp 96.4°F | Resp 20 | Wt 226.7 lb

## 2016-07-11 DIAGNOSIS — Z79899 Other long term (current) drug therapy: Secondary | ICD-10-CM

## 2016-07-11 DIAGNOSIS — C50512 Malignant neoplasm of lower-outer quadrant of left female breast: Secondary | ICD-10-CM | POA: Diagnosis not present

## 2016-07-11 DIAGNOSIS — E039 Hypothyroidism, unspecified: Secondary | ICD-10-CM | POA: Diagnosis not present

## 2016-07-11 DIAGNOSIS — Z171 Estrogen receptor negative status [ER-]: Secondary | ICD-10-CM | POA: Insufficient documentation

## 2016-07-11 DIAGNOSIS — K219 Gastro-esophageal reflux disease without esophagitis: Secondary | ICD-10-CM | POA: Diagnosis not present

## 2016-07-11 DIAGNOSIS — M549 Dorsalgia, unspecified: Secondary | ICD-10-CM | POA: Diagnosis not present

## 2016-07-11 DIAGNOSIS — G4730 Sleep apnea, unspecified: Secondary | ICD-10-CM | POA: Insufficient documentation

## 2016-07-11 DIAGNOSIS — Z7982 Long term (current) use of aspirin: Secondary | ICD-10-CM | POA: Insufficient documentation

## 2016-07-11 DIAGNOSIS — G4489 Other headache syndrome: Secondary | ICD-10-CM | POA: Insufficient documentation

## 2016-07-11 DIAGNOSIS — D649 Anemia, unspecified: Secondary | ICD-10-CM | POA: Insufficient documentation

## 2016-07-11 DIAGNOSIS — N3281 Overactive bladder: Secondary | ICD-10-CM | POA: Diagnosis not present

## 2016-07-11 DIAGNOSIS — I2510 Atherosclerotic heart disease of native coronary artery without angina pectoris: Secondary | ICD-10-CM | POA: Diagnosis not present

## 2016-07-11 DIAGNOSIS — M79604 Pain in right leg: Secondary | ICD-10-CM | POA: Diagnosis not present

## 2016-07-11 DIAGNOSIS — I10 Essential (primary) hypertension: Secondary | ICD-10-CM | POA: Diagnosis not present

## 2016-07-11 DIAGNOSIS — Z170 Estrogen receptor positive status [ER+]: Secondary | ICD-10-CM | POA: Diagnosis not present

## 2016-07-11 DIAGNOSIS — G4700 Insomnia, unspecified: Secondary | ICD-10-CM | POA: Diagnosis not present

## 2016-07-11 DIAGNOSIS — Z9221 Personal history of antineoplastic chemotherapy: Secondary | ICD-10-CM | POA: Diagnosis not present

## 2016-07-11 DIAGNOSIS — I252 Old myocardial infarction: Secondary | ICD-10-CM | POA: Insufficient documentation

## 2016-07-11 NOTE — Progress Notes (Signed)
Patient reports back and right leg pain, being treated by pcp for sciatic nerve pain.

## 2016-07-18 ENCOUNTER — Encounter: Payer: Self-pay | Admitting: Family Medicine

## 2016-07-19 ENCOUNTER — Telehealth: Payer: Self-pay

## 2016-07-19 MED ORDER — DULOXETINE HCL 30 MG PO CPEP: 90.0000 mg | ORAL_CAPSULE | Freq: Every day | ORAL | 3 refills | Status: DC

## 2016-07-19 NOTE — Telephone Encounter (Signed)
Tried to speak to pt over the phone regarding scheduling an appt. I was not able understand fully what the problem was pt was crying. I did get that she was in pain and her mood is not right ( pt own words). She did ask to speak to Dr. Sanda Klein. Spoke to Dr. lada regarding pt; Dr. lada took over the call.

## 2016-07-19 NOTE — Telephone Encounter (Signed)
I spoke with patient She wants to start another medicine for her mood Crying more recently She is safe; no abuse I asked about self-harm or hurting others; "I wouldn't hurt myself"; no plan; no intention; passive only No counselor, has not called yet She is also having ongoing pain; she had xrays at [facility] and also had xrays at [facility] She has pain medicine from [facility], but makes her sleep all day, hydrocodone; none today [facility] told her take steroids, she just finished 2nd round of those and certain things had to be tried before they could do anything Her last visit with [facility] was Thursday and they were going to make a referral to pain management across the street but  She thought the wellbutrin was causing blurred vision and constipation; no improvement in blurred vision but constipation is better Last TSH normal in February She can control her crying, just gets emotional when talking about it Discussed going to ER; charity care available at [facility] ER is most rapid way to get into system and get in to see ortho and get MRI done She is open to increasing cymbalta and not going back on wellbutrin She got outside yesterday She is taking vit B12 and vit D Taking thyroid medicine Depression runs in the family; her father had it and paternal uncle and paternal niece and half-sister (she also drank) Gave her crisis line number, counselor numbers (3), and contracted verbally for safety She has no insurance so I told her about [facility] We'll talk next week, offered appt but she has no insurance and says she is okay talking by phone

## 2016-07-20 NOTE — Addendum Note (Signed)
Addended by: Spence Soberano P on: 07/20/2016 06:00 PM   Modules accepted: Orders

## 2016-07-23 DIAGNOSIS — M4722 Other spondylosis with radiculopathy, cervical region: Secondary | ICD-10-CM | POA: Insufficient documentation

## 2016-07-31 ENCOUNTER — Telehealth: Payer: Self-pay | Admitting: Cardiovascular Disease

## 2016-07-31 NOTE — Telephone Encounter (Signed)
Spoke with patient and she states that she no longer has any insurance and is just unable to pay for a repeat visit. She would like to know based on her test results if she should come in. Let her know that I would check with the physician assistant but that ideally we would like to determine what is causing her symptoms. She verbalizes understanding but just wants to wait if it is safe to do that for now. Will route to [employer] PA to see if this would be acceptable for now. Instructed her to seek help in the ED if she should develop chest pain with shortness of breath. She verbalized understanding of our conversation and had no further questions.

## 2016-07-31 NOTE — Telephone Encounter (Signed)
Called pt to confirm appt Pt had to cancel due to insurance She wants to know if appt was necessary Please call

## 2016-08-01 ENCOUNTER — Ambulatory Visit: Payer: Medicaid Other | Admitting: Nurse Practitioner

## 2016-08-01 NOTE — Telephone Encounter (Signed)
Left detailed voicemail message that given normal results if she is having no symptoms it would be ok to cancel appointment with instructions to call back if any further questions.

## 2016-08-01 NOTE — Telephone Encounter (Signed)
Given normal stress test results and if she is asymptomatic ok to cancel appointment.

## 2016-08-03 ENCOUNTER — Other Ambulatory Visit: Payer: Self-pay | Admitting: Cardiovascular Disease

## 2016-08-06 ENCOUNTER — Other Ambulatory Visit: Payer: Self-pay | Admitting: *Deleted

## 2016-08-06 ENCOUNTER — Telehealth: Payer: Self-pay | Admitting: Cardiovascular Disease

## 2016-08-06 MED ORDER — SIMVASTATIN 10 MG PO TABS
10.0000 mg | ORAL_TABLET | Freq: Every day | ORAL | 3 refills | Status: DC
Start: 2016-08-06 — End: 2017-09-05

## 2016-08-06 NOTE — Telephone Encounter (Signed)
Requested Prescriptions   Signed Prescriptions Disp Refills  . simvastatin (ZOCOR) 10 MG tablet 90 tablet 3    Sig: Take 1 tablet (10 mg total) by mouth daily.    Authorizing Provider: Kathlyn Sacramento A    Ordering User: Britt Bottom

## 2016-08-06 NOTE — Telephone Encounter (Signed)
°*  STAT* If patient is at the pharmacy, call can be transferred to refill team.   1. Which medications need to be refilled? (please list name of each medication and dose if known) Simvastatin   2. Which pharmacy/location (including street and city if local pharmacy) is medication to be sent to? [facility]   3. Do they need a 30 day or 90 day supply? 90 day

## 2016-08-15 ENCOUNTER — Encounter: Payer: Self-pay | Admitting: Family Medicine

## 2016-08-15 ENCOUNTER — Telehealth: Payer: Self-pay

## 2016-08-15 DIAGNOSIS — F330 Major depressive disorder, recurrent, mild: Secondary | ICD-10-CM

## 2016-08-15 MED ORDER — HYDROCODONE-ACETAMINOPHEN 5-325 MG PO TABS: 1.0000 | ORAL_TABLET | Freq: Four times a day (QID) | ORAL | 0 refills | Status: DC | PRN

## 2016-08-15 MED ORDER — HYDROCODONE-ACETAMINOPHEN 5-325 MG PO TABS: 1.0000 | ORAL_TABLET | ORAL | 0 refills | Status: DC | PRN

## 2016-08-15 NOTE — Telephone Encounter (Signed)
Patient called, please review her email

## 2016-08-15 NOTE — Telephone Encounter (Signed)
I returned her call Spoke with patient The doctor does not recommend any pain medicine She goes to see pain clinic doctor again next Wednesday All she can do is sit in her recliner She sees Dr. Elie Confer, neurosurgeon; she's gone patient thinks; left her message on MyChart, but said out of office until 8/5 She is in so much pain; pain level right now is a 9 out of 10 Pain location is right hip, down the leg too; muscle spasms in the back too, but not the same pain as in the hip Did an MRI; reviewed the report; the neurosurgeon wanted to see if the injections would help without doing surgery She has taken hydrocodone which has helped her sleep and rest Reviewed the [facility] web site; we reviewed her fill / prescription history I will provide medicine hydrocodone to get her through to Monday, then neurosurgeon No alcohol, no sleeping pills, no benzos, only the medicine prescribed to her Reviewed allergies If B/B dysfunction, neurosurgical emergency and go to ER immediately Son may come pick up, Robin Howard Also put in referral to [facility] psychiatrist; she has tried to call counselors, not getting any response back If any thoughts of self-harm, go immediately to the ER or call 911

## 2016-08-15 NOTE — Assessment & Plan Note (Signed)
Refer to [facility] per patient request

## 2016-08-20 ENCOUNTER — Other Ambulatory Visit: Payer: Medicaid Other

## 2016-08-21 ENCOUNTER — Telehealth: Payer: Self-pay | Admitting: Family Medicine

## 2016-08-21 NOTE — Telephone Encounter (Signed)
Bland Span from [facility] states that they had tried contacting patient on yesterday and today (no response). They will try again today and again tomorrow. If no response then they will not be able to see the patient.

## 2016-08-21 NOTE — Telephone Encounter (Signed)
This information is was forwarded to Dr. Sanda Klein

## 2016-08-21 NOTE — Telephone Encounter (Signed)
Please contact patient and give her contact name and info and ask her to reach out; thank you

## 2016-08-22 ENCOUNTER — Encounter: Payer: Self-pay | Admitting: Family Medicine

## 2016-08-22 ENCOUNTER — Ambulatory Visit (INDEPENDENT_AMBULATORY_CARE_PROVIDER_SITE_OTHER): Payer: Self-pay | Admitting: Family Medicine

## 2016-08-22 VITALS — BP 142/86 | HR 86 | Temp 99.2°F | Resp 16 | Wt 231.9 lb

## 2016-08-22 DIAGNOSIS — Z5181 Encounter for therapeutic drug level monitoring: Secondary | ICD-10-CM

## 2016-08-22 DIAGNOSIS — F331 Major depressive disorder, recurrent, moderate: Secondary | ICD-10-CM

## 2016-08-22 DIAGNOSIS — I25119 Atherosclerotic heart disease of native coronary artery with unspecified angina pectoris: Secondary | ICD-10-CM

## 2016-08-22 DIAGNOSIS — R3915 Urgency of urination: Secondary | ICD-10-CM

## 2016-08-22 DIAGNOSIS — E039 Hypothyroidism, unspecified: Secondary | ICD-10-CM

## 2016-08-22 LAB — CBC WITH DIFFERENTIAL/PLATELET
BASOS PCT: 1 %
Basophils Absolute: 65 cells/uL (ref 0–200)
EOS ABS: 260 {cells}/uL (ref 15–500)
Eosinophils Relative: 4 %
HEMATOCRIT: 38.3 % (ref 35.0–45.0)
Hemoglobin: 12.9 g/dL (ref 11.7–15.5)
LYMPHS PCT: 26 %
Lymphs Abs: 1690 cells/uL (ref 850–3900)
MCH: 31.1 pg (ref 27.0–33.0)
MCHC: 33.7 g/dL (ref 32.0–36.0)
MCV: 92.3 fL (ref 80.0–100.0)
MONO ABS: 455 {cells}/uL (ref 200–950)
MPV: 9.8 fL (ref 7.5–12.5)
Monocytes Relative: 7 %
NEUTROS PCT: 62 %
Neutro Abs: 4030 cells/uL (ref 1500–7800)
Platelets: 262 10*3/uL (ref 140–400)
RBC: 4.15 MIL/uL (ref 3.80–5.10)
RDW: 14.4 % (ref 11.0–15.0)
WBC: 6.5 10*3/uL (ref 3.8–10.8)

## 2016-08-22 LAB — T4, FREE: FREE T4: 1.1 ng/dL (ref 0.8–1.8)

## 2016-08-22 LAB — TSH: TSH: 1.41 mIU/L

## 2016-08-22 MED ORDER — DULOXETINE HCL 60 MG PO CPEP: 60.0000 mg | ORAL_CAPSULE | Freq: Every day | ORAL | 1 refills | Status: DC

## 2016-08-22 NOTE — Patient Instructions (Addendum)
I recommend that you go back down to 60 mg of cymbalta a day I recommend that you get in to see a psychiatrist as soon as possible and start counseling too Let's get labs today  [facility] Address: [address], [place], [place] [address]  [phone]  Address: [address] [place], [place] [address]  [phone]   Major Depressive Disorder, Adult Major depressive disorder (MDD) is a mental health condition. MDD often makes you feel sad, hopeless, or helpless. MDD can also cause symptoms in your body. MDD can affect your:  Work.  School.  Relationships.  Other normal activities.  MDD can range from mild to very bad. It may occur once (single episode MDD). It can also occur many times (recurrent MDD). The main symptoms of MDD often include:  Feeling sad, depressed, or irritable most of the time.  Loss of interest.  MDD symptoms also include:  Sleeping too much or too little.  Eating too much or too little.  A change in your weight.  Feeling tired (fatigue) or having low energy.  Feeling worthless.  Feeling guilty.  Trouble making decisions.  Trouble thinking clearly.  Thoughts of suicide or harming others.  Feeling weak.  Feeling agitated.  Keeping yourself from being around other people (isolation).  Follow these instructions at home: Activity  Do these things as told by your doctor: ? Go back to your normal activities. ? Exercise regularly. ? Spend time outdoors. Alcohol  Talk with your doctor about how alcohol can affect your antidepressant medicines.  Do not drink alcohol. Or, limit how much alcohol you drink. ? This means no more than 1 drink a day for nonpregnant women and 2 drinks a day for men. One drink equals one of these:  12 oz of beer.  5 oz of wine.  1 oz of hard liquor. General instructions  Take over-the-counter and prescription medicines only as told by your doctor.  Eat a healthy diet.  Get plenty of  sleep.  Find activities that you enjoy. Make time to do them.  Think about joining a support group. Your doctor may be able to suggest a group for you.  Keep all follow-up visits as told by your doctor. This is important. Where to find more information:  [employer] on Mental Illness: ? www.nami.[place]: ? https://carter.com/  National Suicide Prevention Lifeline: ? 618-394-3291. This is free, 24-hour help. Contact a doctor if:  Your symptoms get worse.  You have new symptoms. Get help right away if:  You self-harm.  You see, hear, taste, smell, or feel things that are not present (hallucinate). If you ever feel like you may hurt yourself or others, or have thoughts about taking your own life, get help right away. You can go to your nearest emergency department or call:  Your local emergency services (911 in the [place]  A suicide crisis helpline, such as the National Suicide Prevention Lifeline: ? 4134631487. This is open 24 hours a day.  This information is not intended to replace advice given to you by your health care provider. Make sure you discuss any questions you have with your health care provider. Document Released: 12/13/2014 Document Revised: 09/18/2015 Document Reviewed: 09/18/2015 Elsevier Interactive Patient Education  2017 [employer].

## 2016-08-22 NOTE — Assessment & Plan Note (Signed)
Back to 60 mg of Cymbalta daily; I stressed the need for her to get in to see a psychiatrist and start counseling

## 2016-08-22 NOTE — Assessment & Plan Note (Signed)
Check TSH and free T4 

## 2016-08-22 NOTE — Progress Notes (Signed)
BP (!) 142/86   Pulse 86   Temp 99.2 F (37.3 C)   Resp 16   Wt 231 lb 14.4 oz (105.2 kg)   LMP 08/26/2012 (Exact Date)   SpO2 98%   BMI 38.00 kg/m    Subjective:    Patient ID: Robin Howard, female    DOB: [birth year], 56 y.o.   MRN: [id number]  HPI: Robin Howard is a 56 y.o. female  Chief Complaint  Patient presents with  . Follow-up    Medication mangement; mood not better  . Numbness    both hands no pain     HPI Patient is here for f/u She has been depressed They got in touch with her from [facility], but she doesn't have the money to pay that; she does not have any money; they called yesterday; she is on the 30 mg of the pill of cymbalta to equal 90 mg daily; 90 mg daily is not helping; increased from 60 mg to 90 mg daily She is not having any thoughts of self-harm, no thoughts of hurting others She is not seeing a counselor She is okay if she is at home and sitting on a heating pad  No burning with urination, no boils; just a little nauseated the last few days; not sure if from pain medicine; had some coffee in the lobby before they checked her temp Going for an injection later this afternoon; her back is an 8 out of 10 right now; 9 or 10 out of 10 when standing or moving No loss of control of B/B, but seems like it might be on the verge, can barely make it to the bathroom; feels like urine might be coming; no problems urinating; no stool incontinence  Depression screen [facility] 2/9 08/22/2016 08/22/2016 06/22/2016 05/18/2016 03/28/2016  Decreased Interest 1 0 0 0 0  Down, Depressed, Hopeless 1 0 0 1 1  PHQ - 2 Score 2 0 0 1 1  Altered sleeping 2 - - - -  Tired, decreased energy 3 - - - -  Change in appetite 0 - - - -  Feeling bad or failure about yourself  1 - - - -  Trouble concentrating 3 - - - -  Moving slowly or fidgety/restless 3 - - - -  Suicidal thoughts 1 - - - -  PHQ-9 Score 15 - - - -  Difficult doing work/chores Somewhat difficult - - - -    Relevant past  medical, surgical, family and social history reviewed Past Medical History:  Diagnosis Date  . Anemia    h/o  . Breast cancer ([place]) 07/2014   T1c,N0, ER/ PR negative, Her 2 neu positive. Wide excision, SLN, Mammosite, adjuvant chemotherapy.   . Bronchitis 04-2014  . Chronic headache   . Coronary artery disease     small non-ST elevation myocardial infarction in April 2016 in January 2017. Cardiac catheterization in April 2016 showed mild nonobstructive disease affecting the LAD with tortuous vessels. CTA in 2017 showed only 30% disease in the LAD.  Marland Kitchen Detached retina 1997  . Family history of adverse reaction to anesthesia    pts dad has pseudocholinestrace deficieny  . Gastritis   . GERD (gastroesophageal reflux disease)   . HTN (hypertension)   . Hypothyroidism   . Insomnia   . Last menstrual period (LMP) > 10 days ago    LMP 2014  . Major depressive disorder, recurrent ([place])   . Migraines   . Morbid  obesity ([place])   . NSTEMI (non-ST elevated myocardial infarction) ([place]) 04/16/14   Due to demand- normal Cath 04/19/14  . NSTEMI (non-ST elevated myocardial infarction) ([place]) 01-24-15  . Obesity (BMI 35.0-39.9 without comorbidity) 03/04/2016  . OSA (obstructive sleep apnea)    untreated due to lack of insurance  . Overactive bladder   . Pericarditis 1997  . Pericarditis 1997  . Shortness of breath dyspnea    with exertion only   Past Surgical History:  Procedure Laterality Date  . BACK SURGERY     L4-5  . BREAST BIOPSY Left 07/2014   [facility]  . BREAST CYST ASPIRATION Left 12/13/2014   3:00 position 2 cmfn, fat necrosis per Dr. Dwyane Luo notes  . BREAST LUMPECTOMY WITH SENTINEL LYMPH NODE BIOPSY Left 08/13/2014   Procedure: BREAST LUMPECTOMY WITH SENTINEL LYMPH NODE BIOPSY;  Surgeon: Robert Bellow, MD;  Location: [facility] ORS;  Service: General;  Laterality: Left;  . BREAST MAMMOSITE Left 08/30/2014   Procedure: MAMMOSITE BREAST;  Surgeon: Robert Bellow, MD;  Location: [facility] ORS;   Service: General;  Laterality: Left;  . CESAREAN SECTION    . COLONOSCOPY WITH PROPOFOL N/A 04/18/2016   Procedure: COLONOSCOPY WITH PROPOFOL;  Surgeon: Robert Bellow, MD;  [facility]COPY;  Service: Endoscopy;  Laterality: N/A;  . CORONARY ANGIOPLASTY  April 2016  . EYE SURGERY Left 10/16/2015   retina repair  . FOOT SURGERY    . KNEE SURGERY    . PORTACATH PLACEMENT Right 08/30/2014   Procedure: INSERTION PORT-A-CATH;  Surgeon: Robert Bellow, MD;  Location: [facility] ORS;  Service: General;  Laterality: Right;  . RETINAL DETACHMENT SURGERY Right 1997   Family History  Problem Relation Age of Onset  . Alcohol abuse Mother   . Thyroid disease Mother   . Hypertension Mother   . COPD Mother   . Cancer Father        liposarcoma  . Congestive Heart Failure Father        V Tach  . Depression Father   . Hypertension Father   . Heart attack Father   . Arrhythmia Father   . Heart failure Father   . Cancer Maternal Uncle        Pancreatic cancer  . Heart disease Paternal Grandfather 63       MI  . Diabetes Other   . Cancer Sister        breast  . Breast cancer Sister 18   Social History   Social History  . Marital status: Divorced    Spouse name: N/A  . Number of children: N/A  . Years of education: N/A   Occupational History  . Not on file.   Social History Main Topics  . Smoking status: Never Smoker  . Smokeless tobacco: Never Used  . Alcohol use No  . Drug use: No  . Sexual activity: Not Currently   Other Topics Concern  . Not on file   Social History Narrative  . No narrative on file    Interim medical history since last visit reviewed. Allergies and medications reviewed  Review of Systems Per HPI unless specifically indicated above     Objective:    BP (!) 142/86   Pulse 86   Temp 99.2 F (37.3 C)   Resp 16   Wt 231 lb 14.4 oz (105.2 kg)   LMP 08/26/2012 (Exact Date)   SpO2 98%   BMI 38.00 kg/m   Wt Readings from Last 3 Encounters:  08/22/16 231 lb 14.4 oz (105.2 kg)  07/11/16 226 lb 11.2 oz (102.8 kg)  06/29/16 224 lb (101.6 kg)    Physical Exam  Constitutional: She appears well-developed and well-nourished.  obese  HENT:  Mouth/Throat: Mucous membranes are normal.  Eyes: EOM are normal. No scleral icterus.  Cardiovascular: Normal rate and regular rhythm.   Pulmonary/Chest: Effort normal and breath sounds normal.  Psychiatric: Her behavior is normal. Her mood appears anxious. Her affect is not blunt. Cognition and memory are not impaired. She does not express impulsivity or inappropriate judgment. She exhibits a depressed mood. She expresses no homicidal and no suicidal ideation.  Good eye contact with examiner; not despondent      Assessment & Plan:   Problem List Items Addressed This Visit      Cardiovascular and Mediastinum   Coronary artery disease    Check lipids      Relevant Orders   Lipid panel (Completed)     Endocrine   Hypothyroidism    Check TSH and free T4      Relevant Orders   TSH (Completed)   T4, free (Completed)     Other   Medication monitoring encounter    Check labs      Relevant Orders   COMPLETE METABOLIC PANEL WITH GFR (Completed)   CBC with Differential/Platelet (Completed)   Major depressive disorder, recurrent ([place]) - Primary    Back to 60 mg of Cymbalta daily; I stressed the need for her to get in to see a psychiatrist and start counseling      Relevant Medications   DULoxetine (CYMBALTA) 60 MG capsule   Other Relevant Orders   VITAMIN D 25 Hydroxy (Vit-D Deficiency, Fractures) (Completed)   B12 (Completed)    Other Visit Diagnoses    Urinary urgency       Relevant Orders   Urinalysis w microscopic + reflex cultur       Follow up plan: Return in about 3 weeks (around 09/11/2016).  An after-visit summary was printed and given to the patient at [place].  Please see the patient instructions which may contain other information and recommendations beyond  what is mentioned above in the assessment and plan.  Meds ordered this encounter  Medications  . DISCONTD: DULoxetine HCl (CYMBALTA PO)    Sig: Take 90 mg by mouth daily.  . DULoxetine (CYMBALTA) 60 MG capsule    Sig: Take 1 capsule (60 mg total) by mouth daily.    Dispense:  30 capsule    Refill:  1    Orders Placed This Encounter  Procedures  . TSH  . VITAMIN D 25 Hydroxy (Vit-D Deficiency, Fractures)  . T4, free  . B12  . COMPLETE METABOLIC PANEL WITH GFR  . CBC with Differential/Platelet  . Lipid panel  . Urinalysis w microscopic + reflex cultur

## 2016-08-22 NOTE — Assessment & Plan Note (Signed)
Check lipids 

## 2016-08-22 NOTE — Assessment & Plan Note (Signed)
Check labs 

## 2016-08-22 NOTE — Telephone Encounter (Signed)
The requested information was given to this patient during today's office visit.

## 2016-08-23 LAB — COMPLETE METABOLIC PANEL WITH GFR
ALT: 17 U/L (ref 6–29)
AST: 18 U/L (ref 10–35)
Albumin: 4.2 g/dL (ref 3.6–5.1)
Alkaline Phosphatase: 95 U/L (ref 33–130)
BUN: 14 mg/dL (ref 7–25)
CHLORIDE: 103 mmol/L (ref 98–110)
CO2: 25 mmol/L (ref 20–32)
CREATININE: 0.93 mg/dL (ref 0.50–1.05)
Calcium: 9.9 mg/dL (ref 8.6–10.4)
GFR, Est African American: 81 mL/min (ref >=60)
GFR, Est Non African American: 70 mL/min (ref >=60)
Glucose, Bld: 91 mg/dL (ref 65–99)
Potassium: 4.9 mmol/L (ref 3.5–5.3)
Sodium: 141 mmol/L (ref 135–146)
Total Bilirubin: 0.9 mg/dL (ref 0.2–1.2)
Total Protein: 6.4 g/dL (ref 6.1–8.1)

## 2016-08-23 LAB — VITAMIN B12: VITAMIN B 12: 1219 pg/mL — AB (ref 200–1100)

## 2016-08-23 LAB — LIPID PANEL
Cholesterol: 162 mg/dL (ref <200)
HDL: 47 mg/dL — AB (ref >50)
LDL CALC: 78 mg/dL (ref <100)
Total CHOL/HDL Ratio: 3.4 Ratio (ref <5.0)
Triglycerides: 187 mg/dL — ABNORMAL HIGH (ref <150)
VLDL: 37 mg/dL — ABNORMAL HIGH (ref <30)

## 2016-08-23 LAB — VITAMIN D 25 HYDROXY (VIT D DEFICIENCY, FRACTURES): Vit D, 25-Hydroxy: 60 ng/mL (ref 30–100)

## 2016-08-29 ENCOUNTER — Ambulatory Visit: Payer: Medicaid Other | Admitting: General Surgery

## 2016-09-04 IMAGING — NM NM CARDIA MUGA REST
6 series · 38 of 38 positions shown · non-contrast
Comparison: None.

CLINICAL DATA: Breast cancer. Evaluate cardiac function in relation
to chemotherapy.

EXAM:
NUCLEAR MEDICINE CARDIAC BLOOD POOL IMAGING (MUGA)
TECHNIQUE: Cardiac multi-gated acquisition was performed at rest following
intravenous injection of Nc-IIm labeled red blood cells.
RADIOPHARMACEUTICALS:  21.6 MCi Nc-IIm MDP in-vitro labeled red
blood cells IV

[Series 1000: 70 degree-gated · 3.30mm/px · 6 of 24 frames shown]
[frame 3/24]
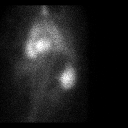
[frame 7/24]
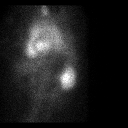
[frame 11/24]
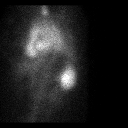
[frame 15/24]
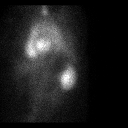
[frame 19/24]
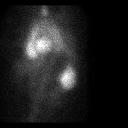
[frame 23/24]
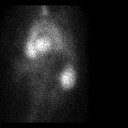

[Series 1000: 45 lao-gated · 3.30mm/px · 6 of 24 frames shown]
[frame 3/24]
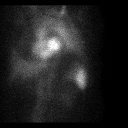
[frame 7/24]
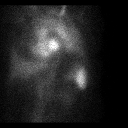
[frame 11/24]
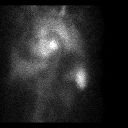
[frame 15/24]
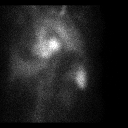
[frame 19/24]
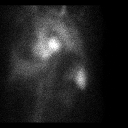
[frame 23/24]
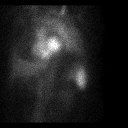

[Series 1000: 45 lao-gated (functional) · 3.30mm/px · 8 of 8 slices shown]
[im 1/8]
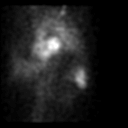
[im 2/8  full-range]
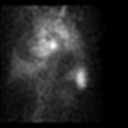
[im 3/8]
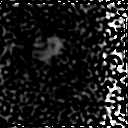
[im 4/8  full-range]
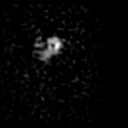
[im 5/8  full-range]
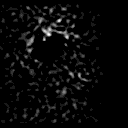
[im 6/8  full-range]
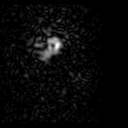
[im 7/8]
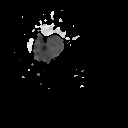
[im 8/8]
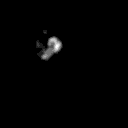

[Series 1000: anterior-gated · 3.30mm/px · 6 of 24 frames shown]
[frame 3/24]
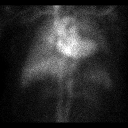
[frame 7/24]
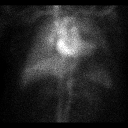
[frame 11/24]
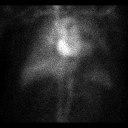
[frame 15/24]
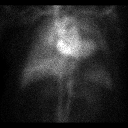
[frame 19/24]
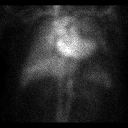
[frame 23/24]
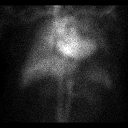

[Series 1000: 45 lao-gated (results) · 3.30mm/px · 6 of 24 frames shown]
[frame 3/24]
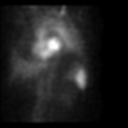
[frame 7/24]
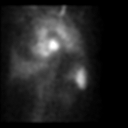
[frame 11/24]
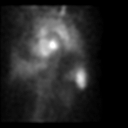
[frame 15/24]
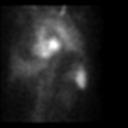
[frame 19/24]
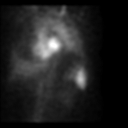
[frame 23/24]
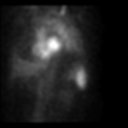

[Series 1000: 45 lao-gated (original with roi) · 3.30mm/px · 6 of 24 frames shown]
[frame 3/24]
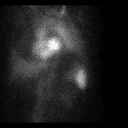
[frame 7/24]
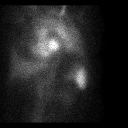
[frame 11/24]
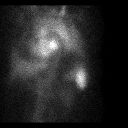
[frame 15/24]
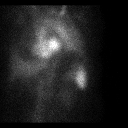
[frame 19/24]
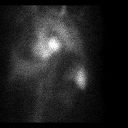
[frame 23/24]
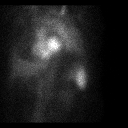

[38 of 38 positions shown; findings below may reference images not displayed]

FINDINGS: No  focal wall motion abnormality of the left ventricle.

Calculated left ventricular ejection fraction equals 52%
IMPRESSION: Left ventricular ejection fraction equals  52  %.

## 2016-09-05 ENCOUNTER — Other Ambulatory Visit: Payer: Self-pay | Admitting: Family Medicine

## 2016-09-06 ENCOUNTER — Encounter: Payer: Self-pay | Admitting: Family Medicine

## 2016-09-10 ENCOUNTER — Ambulatory Visit
Admission: RE | Admit: 2016-09-10 | Discharge: 2016-09-10 | Disposition: A | Discharge status: Home / Self Care | Payer: Self-pay | Source: Ambulatory Visit | Attending: Oncology | Admitting: Oncology

## 2016-09-10 ENCOUNTER — Encounter: Payer: Self-pay | Admitting: *Deleted

## 2016-09-10 ENCOUNTER — Ambulatory Visit: Payer: Self-pay | Attending: Oncology | Admitting: *Deleted

## 2016-09-10 VITALS — BP 118/81 | HR 83 | Temp 98.0°F | Ht 65.0 in | Wt 235.0 lb

## 2016-09-10 DIAGNOSIS — N6459 Other signs and symptoms in breast: Secondary | ICD-10-CM

## 2016-09-10 NOTE — Progress Notes (Signed)
Benign findings on exam and mammogram.  Letter mailed from the [facility] to inform patient of her normal mammogram results.  Patient is to follow-up with annual screening in one year.  HSIS to [place]

## 2016-09-10 NOTE — Progress Notes (Signed)
Subjective:     Patient ID: Robin Howard, female   DOB: [birth year], 56 y.o.   MRN: [id number]  HPI   Review of Systems     Objective:   Physical Exam  Pulmonary/Chest: Right breast exhibits tenderness. Right breast exhibits no inverted nipple, no mass, no nipple discharge and no skin change. Left breast exhibits mass. Left breast exhibits no inverted nipple, no nipple discharge, no skin change and no tenderness. Breasts are asymmetrical.         Assessment:     56 year old White female returns to [facility] for annual screening.  Patient has a history of left breast cancer in 2016.  She was treated with chemotherapy and radiation with mammosite. Patient complains of targeted tenderness at 9:00 left breast 5 cm from the areola.  States it's intermittent and is only present when touched.  States she has noticed it over the last 2 months.  On clinical breast exam, there is a thickening noted at 5:00 left breast at the site of her surgical scar.  I can also palpate an approximate 0.5 cm nodule in the areola at 3:00.  Taught self breast awareness.  Patient is having difficulty ambulating today due to hip and back pain.  States she is going to see an orthopedic surgeon for further evaluation.  Last pap on 07/28/14 was negative / negative.  Next pap due in 2021. Patient has been screened for eligibility.  She does not have any insurance, Medicare or Medicaid.  She also meets financial eligibility.  Hand-out given on the Affordable Care Act.    Plan:     Bilateral diagnostic mammogram and ultrasound ordered.  Will follow-up per [facility] protocol.

## 2016-09-10 NOTE — Patient Instructions (Signed)
Gave patient hand-out, Women Staying Healthy, Active and Well from [facility], with education on breast health, pap smears, heart and colon health. 

## 2016-09-13 ENCOUNTER — Telehealth: Payer: Self-pay | Admitting: Family Medicine

## 2016-09-13 NOTE — Telephone Encounter (Signed)
Please contact patient to f/u on the urine test that has not been resulted; thank you

## 2016-09-14 ENCOUNTER — Ambulatory Visit: Payer: Self-pay | Admitting: Family Medicine

## 2016-09-14 ENCOUNTER — Telehealth: Payer: Self-pay

## 2016-09-14 ENCOUNTER — Emergency Department
Admission: EM | Admit: 2016-09-14 | Discharge: 2016-09-15 | Disposition: A | Discharge status: Home / Self Care | Payer: Self-pay | Attending: Emergency Medicine | Admitting: Emergency Medicine

## 2016-09-14 ENCOUNTER — Encounter: Payer: Self-pay | Admitting: Emergency Medicine

## 2016-09-14 DIAGNOSIS — G8929 Other chronic pain: Secondary | ICD-10-CM

## 2016-09-14 DIAGNOSIS — M5432 Sciatica, left side: Secondary | ICD-10-CM | POA: Insufficient documentation

## 2016-09-14 DIAGNOSIS — M5441 Lumbago with sciatica, right side: Secondary | ICD-10-CM

## 2016-09-14 DIAGNOSIS — I10 Essential (primary) hypertension: Secondary | ICD-10-CM | POA: Insufficient documentation

## 2016-09-14 DIAGNOSIS — E039 Hypothyroidism, unspecified: Secondary | ICD-10-CM | POA: Insufficient documentation

## 2016-09-14 DIAGNOSIS — I259 Chronic ischemic heart disease, unspecified: Secondary | ICD-10-CM | POA: Insufficient documentation

## 2016-09-14 DIAGNOSIS — G43909 Migraine, unspecified, not intractable, without status migrainosus: Secondary | ICD-10-CM

## 2016-09-14 DIAGNOSIS — M545 Low back pain: Secondary | ICD-10-CM | POA: Insufficient documentation

## 2016-09-14 DIAGNOSIS — J069 Acute upper respiratory infection, unspecified: Secondary | ICD-10-CM

## 2016-09-14 DIAGNOSIS — Z853 Personal history of malignant neoplasm of breast: Secondary | ICD-10-CM | POA: Insufficient documentation

## 2016-09-14 LAB — CBC
HCT: 37.8 % (ref 35.0–47.0)
Hemoglobin: 12.9 g/dL (ref 12.0–16.0)
MCH: 31.1 pg (ref 26.0–34.0)
MCHC: 34.3 g/dL (ref 32.0–36.0)
MCV: 90.7 fL (ref 80.0–100.0)
PLATELETS: 252 10*3/uL (ref 150–440)
RBC: 4.17 MIL/uL (ref 3.80–5.20)
RDW: 13.9 % (ref 11.5–14.5)
WBC: 6.2 10*3/uL (ref 3.6–11.0)

## 2016-09-14 LAB — COMPREHENSIVE METABOLIC PANEL
ALBUMIN: 4.3 g/dL (ref 3.5–5.0)
ALT: 22 U/L (ref 14–54)
AST: 34 U/L (ref 15–41)
Alkaline Phosphatase: 82 U/L (ref 38–126)
Anion gap: 10 (ref 5–15)
BUN: 17 mg/dL (ref 6–20)
CHLORIDE: 107 mmol/L (ref 101–111)
CO2: 24 mmol/L (ref 22–32)
CREATININE: 1.14 mg/dL — AB (ref 0.44–1.00)
Calcium: 9.7 mg/dL (ref 8.9–10.3)
GFR calc Af Amer: >60 mL/min (ref >60)
GFR calc non Af Amer: 54 mL/min — ABNORMAL LOW (ref >60)
GLUCOSE: 104 mg/dL — AB (ref 65–99)
POTASSIUM: 3.8 mmol/L (ref 3.5–5.1)
Sodium: 141 mmol/L (ref 135–145)
Total Bilirubin: 1.5 mg/dL — ABNORMAL HIGH (ref 0.3–1.2)
Total Protein: 7.3 g/dL (ref 6.5–8.1)

## 2016-09-14 LAB — TSH: TSH: 2.879 u[IU]/mL (ref 0.350–4.500)

## 2016-09-14 LAB — T4, FREE: Free T4: 0.9 ng/dL (ref 0.61–1.12)

## 2016-09-14 MED ORDER — DEXAMETHASONE SODIUM PHOSPHATE 10 MG/ML IJ SOLN
10.0000 mg | Freq: Once | INTRAMUSCULAR | Status: AC
  Administered 2016-09-14: 10 mg via INTRAMUSCULAR
  Filled 2016-09-14: qty 1

## 2016-09-14 MED ORDER — KETOROLAC TROMETHAMINE 60 MG/2ML IM SOLN
15.0000 mg | Freq: Once | INTRAMUSCULAR | Status: AC
  Administered 2016-09-14: 15 mg via INTRAMUSCULAR
  Filled 2016-09-14: qty 2

## 2016-09-14 MED ORDER — FAMOTIDINE 20 MG PO TABS
40.0000 mg | ORAL_TABLET | Freq: Once | ORAL | Status: AC
  Administered 2016-09-14: 40 mg via ORAL
  Filled 2016-09-14: qty 2

## 2016-09-14 MED ORDER — NAPROXEN 500 MG PO TABS: 500.0000 mg | ORAL_TABLET | Freq: Two times a day (BID) | ORAL | 0 refills | Status: DC

## 2016-09-14 MED ORDER — PREDNISONE 20 MG PO TABS: 40.0000 mg | ORAL_TABLET | Freq: Every day | ORAL | 0 refills | Status: DC

## 2016-09-14 MED ORDER — FAMOTIDINE 20 MG PO TABS: 20.0000 mg | ORAL_TABLET | Freq: Two times a day (BID) | ORAL | 0 refills | Status: DC

## 2016-09-14 NOTE — Telephone Encounter (Signed)
FYI patient came in for an appt today, she has no ins. And patients are required to pay $50 at the time of the visit.  Patient did not have the money to pay and has been told this several times.  Suanne Marker has even paid for her at last visit.  She stormed out of the office upset today.  She has also been [facility].

## 2016-09-14 NOTE — ED Notes (Signed)
Dr Joni Fears aware of pts reluctance to leave. Pt hs received d/c paperwork. Matt rn aware of pt and has received report.

## 2016-09-14 NOTE — ED Notes (Signed)
Gave pt. Discharge report and papers. Pt. Starts crying because doctor is not sending her home with anything. Pt. Has a follow up visit with ortho. Dr. Alinda Dooms benadryl to help pt. Sleep.

## 2016-09-14 NOTE — ED Triage Notes (Signed)
Pt reports migraine headache that began two days ago. 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 Pt reports chronic back pain and right side sciatica that has worsened over the course of two weeks and now she is unable to sleep due to the pain. Pt reports sore throat and a mass on the front of her neck. Pt appears uncomfortable in triage.

## 2016-09-14 NOTE — ED Provider Notes (Signed)
[facility] Emergency Department Provider Note  ____________________________________________  Time seen: Approximately 5:37 PM  I have reviewed the triage vital signs and the nursing notes.   HISTORY  Chief Complaint Leg Pain; Headache; and Mass    HPI Robin Howard is a 56 y.o. female who complains of bilateral frontal headache for the past two days, nonradiating, without aggravating/allevaiting factors, mild to moderate intensity. Assoc. With sore throat, diffuse body aches.    Also c/o chronic low back pain with right side sciatica. Seeing ortho, had mri at [facility] recently. No new injury or sx. No bladder/bowel incont/retention. No LE weak/paresthesia.   pt requests food.     Past Medical History:  Diagnosis Date  . Anemia    h/o  . Breast cancer ([place]) 07/2014   T1c,N0, ER/ PR negative, Her 2 neu positive. Wide excision, SLN, Mammosite, adjuvant chemotherapy.   . Bronchitis 04-2014  . Chronic headache   . Coronary artery disease     small non-ST elevation myocardial infarction in April 2016 in January 2017. Cardiac catheterization in April 2016 showed mild nonobstructive disease affecting the LAD with tortuous vessels. CTA in 2017 showed only 30% disease in the LAD.  Marland Kitchen Detached retina 1997  . Family history of adverse reaction to anesthesia    pts dad has pseudocholinestrace deficieny  . Gastritis   . GERD (gastroesophageal reflux disease)   . HTN (hypertension)   . Hypothyroidism   . Insomnia   . Last menstrual period (LMP) > 10 days ago    LMP 2014  . Major depressive disorder, recurrent ([place])   . Migraines   . Morbid obesity ([place])   . NSTEMI (non-ST elevated myocardial infarction) ([place]) 04/16/14   Due to demand- normal Cath 04/19/14  . NSTEMI (non-ST elevated myocardial infarction) ([place]) 01-24-15  . Obesity (BMI 35.0-39.9 without comorbidity) 03/04/2016  . OSA (obstructive sleep apnea)    untreated due to lack of insurance  . Overactive bladder    . Pericarditis 1997  . Pericarditis 1997  . Shortness of breath dyspnea    with exertion only     Patient Active Problem List   Diagnosis Date Noted  . Medication monitoring encounter 08/22/2016  . Perioral dermatitis 05/18/2016  . Obesity (BMI 35.0-39.9 without comorbidity) 03/04/2016  . Hypocalcemia 09/22/2015  . Coronary artery disease   . NSTEMI (non-ST elevated myocardial infarction) ([place]) 01/25/2015  . Angina pectoris ([place]) 01/24/2015  . Colitis 09/23/2014  . Breast cancer of lower-outer quadrant of left female breast ([place]) 08/10/2014  . Migraine 07/15/2014  . Fatigue 07/15/2014  . Major depressive disorder, recurrent ([place])   . HTN (hypertension)   . Overactive bladder   . Hypothyroidism   . OSA (obstructive sleep apnea)   . GERD (gastroesophageal reflux disease)   . Chronic headache      Past Surgical History:  Procedure Laterality Date  . BACK SURGERY     L4-5  . BREAST BIOPSY Left 07/2014   [facility]  . BREAST CYST ASPIRATION Left 12/13/2014   3:00 position 2 cmfn, fat necrosis per Dr. Dwyane Luo notes  . BREAST LUMPECTOMY WITH SENTINEL LYMPH NODE BIOPSY Left 08/13/2014   Procedure: BREAST LUMPECTOMY WITH SENTINEL LYMPH NODE BIOPSY;  Surgeon: Robert Bellow, MD;  Location: [facility] ORS;  Service: General;  Laterality: Left;  . BREAST MAMMOSITE Left 08/30/2014   Procedure: MAMMOSITE BREAST;  Surgeon: Robert Bellow, MD;  Location: [facility] ORS;  Service: General;  Laterality: Left;  . CESAREAN SECTION    .  COLONOSCOPY WITH PROPOFOL N/A 04/18/2016   Procedure: COLONOSCOPY WITH PROPOFOL;  Surgeon: Robert Bellow, MD;  Location: [facility] ENDOSCOPY;  Service: Endoscopy;  Laterality: N/A;  . CORONARY ANGIOPLASTY  April 2016  . EYE SURGERY Left 10/16/2015   retina repair  . FOOT SURGERY    . KNEE SURGERY    . PORTACATH PLACEMENT Right 08/30/2014   Procedure: INSERTION PORT-A-CATH;  Surgeon: Robert Bellow, MD;  Location: [facility] ORS;  Service: General;  Laterality: Right;  .  RETINAL DETACHMENT SURGERY Right 1997     Prior to Admission medications   Medication Sig Start Date End Date Taking? Authorizing Provider  acetaminophen (TYLENOL) 500 MG tablet Take 1,000 mg by mouth every 6 (six) hours as needed for mild pain or headache.    [provider]  ascorbic acid (VITAMIN C) 250 MG CHEW Chew 250 mg by mouth daily.    [provider]  aspirin EC 81 MG tablet Take 81 mg by mouth daily.    [provider]  CALCIUM PO Take 1 tablet by mouth 2 (two) times daily.     [provider]  cholecalciferol (VITAMIN D) 1000 UNITS tablet Take 1,000 Units by mouth daily.    [provider]  DULoxetine (CYMBALTA) 60 MG capsule Take 1 capsule (60 mg total) by mouth daily. 08/22/16   Arnetha Courser, MD  famotidine (PEPCID) 20 MG tablet Take 1 tablet (20 mg total) by mouth 2 (two) times daily. 09/14/16   Carrie Mew, MD  fluticasone [facility]) 50 MCG/ACT nasal spray Place 2 sprays into both nostrils daily. 06/22/16   Arnetha Courser, MD  HYDROcodone-acetaminophen (NORCO/VICODIN) 5-325 MG tablet Take 1 tablet by mouth every 6 (six) hours as needed. 08/15/16   Lada, Satira Anis, MD  levothyroxine (SYNTHROID, LEVOTHROID) 100 MCG tablet Take 1 tablet (100 mcg total) by mouth daily. 05/18/16   Arnetha Courser, MD  loratadine (CLARITIN) 10 MG tablet Take 10 mg by mouth daily.    [provider]  losartan (COZAAR) 25 MG tablet TAKE 1 TABLET BY MOUTH ONCE DAILY 07/11/16   Wellington Hampshire, MD  metoprolol succinate (TOPROL-XL) 25 MG 24 hr tablet Take 3 tablets (75 mg total) by mouth daily. Take with or immediately following a meal. 06/21/16 09/19/16  Dunn, Areta Haber, PA-C  metroNIDAZOLE (METROGEL) 1 % gel Apply topically daily. Patient not taking: Reported on 08/22/2016 05/18/16   Arnetha Courser, MD  Multiple Vitamin (MULTIVITAMIN WITH MINERALS) TABS tablet Take 1 tablet by mouth daily.    [provider]  naproxen (NAPROSYN) 500 MG tablet Take 1  tablet (500 mg total) by mouth 2 (two) times daily with a meal. 09/14/16   Carrie Mew, MD  predniSONE (DELTASONE) 20 MG tablet Take 2 tablets (40 mg total) by mouth daily. 09/14/16   Carrie Mew, MD  ranitidine (ZANTAC) 300 MG tablet TAKE ONE TABLET BY MOUTH AT BEDTIME (REPLACES OMEPRAZOLE) 02/22/16   Lada, Satira Anis, MD  simvastatin (ZOCOR) 10 MG tablet Take 1 tablet (10 mg total) by mouth daily. 08/06/16   Wellington Hampshire, MD  vitamin B-12 (CYANOCOBALAMIN) 1000 MCG tablet Take 1,000 mcg by mouth daily.    [provider]     Allergies Reglan [metoclopramide]; Paxil [paroxetine hcl]; Tegaderm ag mesh [silver]; and Ultram [tramadol]   Family History  Problem Relation Age of Onset  . Alcohol abuse Mother   . Thyroid disease Mother   . Hypertension Mother   . COPD Mother   .  Cancer Father        liposarcoma  . Congestive Heart Failure Father        V Tach  . Depression Father   . Hypertension Father   . Heart attack Father   . Arrhythmia Father   . Heart failure Father   . Cancer Maternal Uncle        Pancreatic cancer  . Heart disease Paternal Grandfather 20       MI  . Diabetes Other   . Cancer Sister        breast  . Breast cancer Sister 61    Social History Social History  Substance Use Topics  . Smoking status: Never Smoker  . Smokeless tobacco: Never Used  . Alcohol use No    Review of Systems  Constitutional:   No fever or chills.  ENT:   Positive sore throat. No rhinorrhea. Cardiovascular:   No chest pain or syncope. Respiratory:   No dyspnea or cough. Gastrointestinal:   Negative for abdominal pain, vomiting and diarrhea.  Musculoskeletal:   Diffuse body aches. Chronic right low back pain.  All other systems reviewed and are negative except as documented above in ROS and HPI.  ____________________________________________   PHYSICAL EXAM:  VITAL SIGNS: ED Triage Vitals  Enc Vitals Group     BP 09/14/16 1131 (!) 115/92     Pulse  Rate 09/14/16 1131 95     Resp 09/14/16 1131 20     Temp 09/14/16 1131 99.3 F (37.4 C)     Temp src --      SpO2 09/14/16 1131 98 %     Weight 09/14/16 1127 230 lb (104.3 kg)     Height 09/14/16 1127 5' 5.5" (1.664 m)     Head Circumference --      Peak Flow --      Pain Score 09/14/16 1127 7     Pain Loc --      Pain Edu? --      Excl. in [place]? --     Vital signs reviewed, nursing assessments reviewed.   Constitutional:   Alert and oriented. Well appearing and in no distress. Eyes:   No scleral icterus.  EOMI. No nystagmus. No conjunctival pallor. PERRL. ENT   Head:   Normocephalic and atraumatic.   Nose:   No congestion/rhinnorhea.    Mouth/Throat:   MMM, no pharyngeal erythema. No peritonsillar mass.    Neck:   No meningismus. Full ROM. "mass" on anterior neck pt is worried about turns out to be her thyroid cartilage. Hematological/Lymphatic/Immunilogical:   No cervical lymphadenopathy. Cardiovascular:   RRR. Symmetric bilateral radial and DP pulses.  No murmurs.  Respiratory:   Normal respiratory effort without tachypnea/retractions. Breath sounds are clear and equal bilaterally. No wheezes/rales/rhonchi. Gastrointestinal:   Soft and nontender. Non distended. There is no CVA tenderness.  No rebound, rigidity, or guarding. Genitourinary:   deferred Musculoskeletal:   Normal range of motion in all extremities. No joint effusions.  No lower extremity tenderness.  No edema.straight leg raise positive on right at 30 degrees. Mild tenderness in right low back musculature. No midline spinal tenderness. Neurologic:   Normal speech and language.  Motor grossly intact. Nl gait No gross focal neurologic deficits are appreciated.  Skin:    Skin is warm, dry and intact. No rash noted.  No petechiae, purpura, or bullae.  ____________________________________________    LABS (pertinent positives/negatives) (all labs ordered are listed, but only abnormal results are  displayed) Labs Reviewed  COMPREHENSIVE METABOLIC PANEL - Abnormal; Notable for the following:       Result Value   Glucose, Bld 104 (*)    Creatinine, Ser 1.14 (*)    Total Bilirubin 1.5 (*)    GFR calc non Af Amer 54 (*)    All other components within normal limits  CBC  TSH  T4, FREE  T3 UPTAKE   ____________________________________________   EKG    ____________________________________________    RADIOLOGY  No results found.  ____________________________________________   PROCEDURES Procedures  ____________________________________________   INITIAL IMPRESSION / ASSESSMENT AND PLAN / ED COURSE  Pertinent labs & imaging results that were available during my care of the patient were reviewed by me and considered in my medical decision making (see chart for details).    Clinical Course as of Sep 15 1735  Fri Sep 14, 2016  1610 P/w chronic back pain, myalgias, throat pain. Likely viral URI. Will give nsaids, steroid  [PS]    Clinical Course User Index [PS] Carrie Mew, MD    ----------------------------------------- 5:48 PM on 09/14/2016 -----------------------------------------  Tolerating oral intake. Medically stable. Suitable for outpt f/u, sx tx with steroids, nsaids, pepcid for gastric protection.    ____________________________________________   FINAL CLINICAL IMPRESSION(S) / ED DIAGNOSES  Final diagnoses:  Migraine without status migrainosus, not intractable, unspecified migraine type  Upper respiratory tract infection, unspecified type  Chronic right-sided low back pain with right-sided sciatica      New Prescriptions   FAMOTIDINE (PEPCID) 20 MG TABLET    Take 1 tablet (20 mg total) by mouth 2 (two) times daily.   NAPROXEN (NAPROSYN) 500 MG TABLET    Take 1 tablet (500 mg total) by mouth 2 (two) times daily with a meal.   PREDNISONE (DELTASONE) 20 MG TABLET    Take 2 tablets (40 mg total) by mouth daily.     Portions of this  note were generated with dragon dictation software. Dictation errors may occur despite best attempts at proofreading.    Carrie Mew, MD 09/14/16 [phone]

## 2016-09-14 NOTE — Telephone Encounter (Signed)
Pt came in but do not have insurance therefore she was upset due to having to pay $ 50 just to be seen. Robin Howard routing a message to Dr. lada regarding this issue.

## 2016-09-15 ENCOUNTER — Encounter: Payer: Self-pay | Admitting: Family Medicine

## 2016-09-15 LAB — T3 UPTAKE: T3 Uptake Ratio: 25 % (ref 24–39)

## 2016-10-15 ENCOUNTER — Ambulatory Visit (INDEPENDENT_AMBULATORY_CARE_PROVIDER_SITE_OTHER): Payer: Self-pay | Admitting: Family Medicine

## 2016-10-15 ENCOUNTER — Encounter: Payer: Self-pay | Admitting: Family Medicine

## 2016-10-15 VITALS — BP 134/86 | HR 96 | Temp 98.9°F | Resp 14 | Ht 65.5 in | Wt 243.6 lb

## 2016-10-15 DIAGNOSIS — R059 Cough, unspecified: Secondary | ICD-10-CM

## 2016-10-15 DIAGNOSIS — R1011 Right upper quadrant pain: Secondary | ICD-10-CM

## 2016-10-15 DIAGNOSIS — R221 Localized swelling, mass and lump, neck: Secondary | ICD-10-CM | POA: Insufficient documentation

## 2016-10-15 DIAGNOSIS — R05 Cough: Secondary | ICD-10-CM

## 2016-10-15 DIAGNOSIS — I2510 Atherosclerotic heart disease of native coronary artery without angina pectoris: Secondary | ICD-10-CM

## 2016-10-15 DIAGNOSIS — I10 Essential (primary) hypertension: Secondary | ICD-10-CM

## 2016-10-15 DIAGNOSIS — E039 Hypothyroidism, unspecified: Secondary | ICD-10-CM

## 2016-10-15 NOTE — Patient Instructions (Signed)
Please do keep the appointment with [facility] tomorrow Encourage them to call me to facilitate your work-up to make sure you are safe and ready for surgery Chin up and expect the best!

## 2016-10-15 NOTE — Assessment & Plan Note (Addendum)
Urged her to get a primary through [facility] for evaluation; explained this may affect intubation; risk for aspiration pneumonitis, aspiration pneumonia; she'll show this to her anesthesia team tomorrow; should have ultrasound of the neck

## 2016-10-15 NOTE — Progress Notes (Signed)
BP 134/86 (BP Location: Right Arm, Patient Position: Sitting, Cuff Size: Normal)   Pulse 96   Temp 98.9 F (37.2 C) (Oral)   Resp 14   Ht 5' 5.5" (1.664 m)   Wt 243 lb 9.6 oz (110.5 kg)   LMP 08/26/2012 (Exact Date)   SpO2 98%   BMI 39.92 kg/m    Subjective:    Patient ID: Robin Howard, female    DOB: [birth year], 56 y.o.   MRN: [id number]  HPI: Robin Howard is a 56 y.o. female  Chief Complaint  Patient presents with  . surgical clearance    HPI Here for surgical clearance Surgery planned under general anesthesia on October 15th, L3-L5 lumbar decompression Prior surgeries, no problems with anesthesia, goes for pre-op tomorrow with them; EKG tomorrow, and they will do labs too Father had pseudocholinesterase deficiency, but patient has never had any problems She has had several episodes of coughing; also has trouble swallowing, has felt a mass over the front over her neck; just when she is done eating, she'll have a coughing fit; after eating, just starts coughing Mass has been there for a month; went to the ER and they were not worried about it; [facility] to [facility]; father had a fatty tumor of some type, first biopsy was not cancer, but the mass removed was liposarcoma Back pain and leg pain when she slipped on wet steps, mechanical from wet steps, just went down, bruised herself in a few places; she does not think her left ankle is broken or needs an xray; resolving No hx of MRSA, no skin infections Total bilirubin was up in the ER and patient had tenderness then, still has gallbladder  Depression screen [facility] 2/9 10/15/2016 08/22/2016 08/22/2016 06/22/2016 05/18/2016  Decreased Interest 1 1 0 0 0  Down, Depressed, Hopeless 1 1 0 0 1  PHQ - 2 Score 2 2 0 0 1  Altered sleeping 1 2 - - -  Tired, decreased energy - 3 - - -  Change in appetite 0 0 - - -  Feeling bad or failure about yourself  0 1 - - -  Trouble concentrating 1 3 - - -  Moving slowly or fidgety/restless 1 3 - - -    Suicidal thoughts 0 1 - - -  PHQ-9 Score 5 15 - - -  Difficult doing work/chores Not difficult at all Somewhat difficult - - -    Relevant past medical, surgical, family and social history reviewed Past Medical History:  Diagnosis Date  . Anemia    h/o  . Breast cancer ([place]) 07/2014   T1c,N0, ER/ PR negative, Her 2 neu positive. Wide excision, SLN, Mammosite, adjuvant chemotherapy.   . Bronchitis 04-2014  . Chronic headache   . Coronary artery disease     small non-ST elevation myocardial infarction in April 2016 in January 2017. Cardiac catheterization in April 2016 showed mild nonobstructive disease affecting the LAD with tortuous vessels. CTA in 2017 showed only 30% disease in the LAD.  Marland Kitchen Detached retina 1997  . Family history of adverse reaction to anesthesia    pts dad has pseudocholinestrace deficieny  . Gastritis   . GERD (gastroesophageal reflux disease)   . HTN (hypertension)   . Hypothyroidism   . Insomnia   . Last menstrual period (LMP) > 10 days ago    LMP 2014  . Major depressive disorder, recurrent ([place])   . Migraines   . Morbid obesity ([place])   .  NSTEMI (non-ST elevated myocardial infarction) ([place]) 04/16/14   Due to demand- normal Cath 04/19/14  . NSTEMI (non-ST elevated myocardial infarction) ([place]) 01-24-15  . Obesity (BMI 35.0-39.9 without comorbidity) 03/04/2016  . OSA (obstructive sleep apnea)    untreated due to lack of insurance  . Overactive bladder   . Pericarditis 1997  . Pericarditis 1997  . Shortness of breath dyspnea    with exertion only   Past Surgical History:  Procedure Laterality Date  . BACK SURGERY     L4-5  . BRE[facility]OPSY [facility]2014   [facility]  . BREAST CYST ASPIRATION Left 12/13/2014   3:00 position 2 cmfn, fat necrosis per Dr. Dwyane Luo notes  . BREAST LUMPECTOMY WITH SENTINEL LYMPH NODE BIOPSY Left 08/13/2014   Procedure: BREAST LUMPECTOMY WITH SENTINEL LYMPH NODE BIOPSY;  Surgeon: Robert Bellow, MD;  Location: [facility] ORS;  Service:  General;  Laterality: Left;  . BREAST MAMMOSITE Left 08/30/2014   Procedure: MAMMOSITE BREAST;  Surgeon: Robert Bellow, MD;  Location: [facility] ORS;  Service: General;  Laterality: Left;  . CESAREAN SECTION    . COLONOSCOPY WITH PROPOFOL N/A 04/18/2016   Procedure: COLONOSCOPY WITH PROPOFOL;  Surgeon: Robert Bellow, MD;  Location: [facility] ENDOSCOPY;  Service: Endoscopy;  Laterality: N/A;  . CORONARY ANGIOPLASTY  April 2016  . EYE SURGERY Left 10/16/2015   retina repair  . FOOT SURGERY    . KNEE SURGERY    . PORTACATH PLACEMENT Right 08/30/2014   Procedure: INSERTION PORT-A-CATH;  Surgeon: Robert Bellow, MD;  Location: [facility] ORS;  Service: General;  Laterality: Right;  . RETINAL DETACHMENT SURGERY Right 1997   Family History  Problem Relation Age of Onset  . Alcohol abuse Mother   . Thyroid disease Mother   . Hypertension Mother   . COPD Mother   . Cancer Father        liposarcoma  . Congestive Heart Failure Father        V Tach  . Depression Father   . Hypertension Father   . Heart attack Father   . Arrhythmia Father   . Heart failure Father   . Cancer Maternal Uncle        Pancreatic cancer  . Heart disease Paternal Grandfather 54       MI  . Diabetes Other   . Cancer Sister        breast  . Breast cancer Sister 3   Social History   Social History  . Marital status: Divorced    Spouse name: N/A  . Number of children: N/A  . Years of education: N/A   Occupational History  . Not on file.   Social History Main Topics  . Smoking status: Never Smoker  . Smokeless tobacco: Never Used  . Alcohol use No  . Drug use: No  . Sexual activity: Not Currently   Other Topics Concern  . Not on file   Social History Narrative  . No narrative on file    Interim medical history since last visit reviewed. Allergies and medications reviewed  Review of Systems  Constitutional: Positive for unexpected weight change (because of the back / leg pain, not as active).  Negative for fever.  HENT: Negative for sinus pain, sinus pressure and sore throat.        Ears have been itching  Eyes: Negative for pain and discharge.  Respiratory: Positive for cough.   Cardiovascular: Negative for chest pain and palpitations.  Gastrointestinal: Positive for abdominal  pain (RUQ).  Endocrine: Negative for polydipsia.  Genitourinary: Negative for dysuria, hematuria and urgency.  Musculoskeletal: Positive for back pain.  Skin:       No boils  Allergic/Immunologic: Negative for food allergies.  Neurological: Positive for headaches ("bad" headache last night, but I always have headaches she says; migraines, typical for her, nothing different; Dr. Grayland Ormond did a scan several months ago).  Hematological: Negative for adenopathy.  Psychiatric/Behavioral: Positive for dysphoric mood (she is just tearful that she might not get surgery, no SI/HI).   Per HPI unless specifically indicated above     Objective:    BP 134/86 (BP Location: Right Arm, Patient Position: Sitting, Cuff Size: Normal)   Pulse 96   Temp 98.9 F (37.2 C) (Oral)   Resp 14   Ht 5' 5.5" (1.664 m)   Wt 243 lb 9.6 oz (110.5 kg)   LMP 08/26/2012 (Exact Date)   SpO2 98%   BMI 39.92 kg/m   Wt Readings from Last 3 Encounters:  10/15/16 243 lb 9.6 oz (110.5 kg)  09/14/16 230 lb (104.3 kg)  09/10/16 235 lb (106.6 kg)    Physical Exam  Constitutional: She appears well-developed and well-nourished.  obese  HENT:  Mouth/Throat: Mucous membranes are normal.  Eyes: EOM are normal. No scleral icterus.  Neck: Thyroid mass present.    Soft mass anteriorly and just left of hte midline; consistency is similar to a lipoma; nontender  Cardiovascular: Normal rate and regular rhythm.   Pulmonary/Chest: Effort normal and breath sounds normal.  Abdominal: There is tenderness in the right upper quadrant.  Skin: She is not diaphoretic. No pallor.  Psychiatric: Her behavior is normal. Her mood appears anxious. Her  affect is not blunt. Cognition and memory are not impaired. She does not express impulsivity or inappropriate judgment. She exhibits a depressed mood. She expresses no homicidal and no suicidal ideation.  Good eye contact with examiner; not despondent; became tearful when thinking that her medical issues might postpone her surgery    Results for orders placed or performed during the hospital encounter of 09/14/16  CBC  Result Value Ref Range   WBC 6.2 3.6 - 11.0 K/uL   RBC 4.17 3.80 - 5.20 MIL/uL   Hemoglobin 12.9 12.0 - 16.0 g/dL   HCT 37.8 35.0 - 47.0 %   MCV 90.7 80.0 - 100.0 fL   MCH 31.1 26.0 - 34.0 pg   MCHC 34.3 32.0 - 36.0 g/dL   RDW 13.9 11.5 - 14.5 %   Platelets 252 150 - 440 K/uL  Comprehensive metabolic panel  Result Value Ref Range   Sodium 141 135 - 145 mmol/L   Potassium 3.8 3.5 - 5.1 mmol/L   Chloride 107 101 - 111 mmol/L   CO2 24 22 - 32 mmol/L   Glucose, Bld 104 (H) 65 - 99 mg/dL   BUN 17 6 - 20 mg/dL   Creatinine, Ser 1.14 (H) 0.44 - 1.00 mg/dL   Calcium 9.7 8.9 - 10.3 mg/dL   Total Protein 7.3 6.5 - 8.1 g/dL   Albumin 4.3 3.5 - 5.0 g/dL   AST 34 15 - 41 U/L   ALT 22 14 - 54 U/L   Alkaline Phosphatase 82 38 - 126 U/L   Total Bilirubin 1.5 (H) 0.3 - 1.2 mg/dL   GFR calc non Af Amer 54 (L) >60 mL/min   GFR calc Af Amer >60 >60 mL/min   Anion gap 10 5 - 15  TSH  Result Value  Ref Range   TSH 2.879 0.350 - 4.500 uIU/mL  T4, free  Result Value Ref Range   Free T4 0.90 0.61 - 1.12 ng/dL  T3 uptake  Result Value Ref Range   T3 Uptake Ratio 25 24 - 39 %      Assessment & Plan:   Problem List Items Addressed This Visit      Cardiovascular and Mediastinum   HTN (hypertension)    Systolic under 921, diastolic under 90      Coronary artery disease    Managed by Dr. Fletcher Anon, 30% LAD in 2017 by CTA        Endocrine   Hypothyroidism    Now with soft swelling over the anterior neck; need to evaluate this prior to intubation; order for [place] or patient may  have this done through pre-op, anesthesia, or PCP at [facility]        Other   Neck mass    Urged her to get a primary through [facility] for evaluation; explained this may affect intubation; risk for aspiration pneumonitis, aspiration pneumonia; she'll show this to her anesthesia team tomorrow; should have ultrasound of the neck      Relevant Orders   US Soft Tissue Head/Neck    Other Visit Diagnoses    Cough    -  Primary   Relevant Orders   DG Chest 2 View   US Soft Tissue Head/Neck   RUQ pain       will need evaluation, RUQ [place] and liver function tests prior to surgery      See form completed for patient; not cleared for surgery until additional work-up completed, which I hope can be done prior to her planned surgery date  Follow up plan: No Follow-up on file.  An after-visit summary was printed and given to the patient at [facility].  Please see the patient instructions which may contain other information and recommendations beyond what is mentioned above in the assessment and plan.  No orders of the defined types were placed in this encounter.   Orders Placed This Encounter  Procedures  . DG Chest 2 View  . US Soft Tissue Head/Neck

## 2016-10-17 ENCOUNTER — Telehealth: Payer: Self-pay

## 2016-10-18 NOTE — Assessment & Plan Note (Signed)
Now with soft swelling over the anterior neck; need to evaluate this prior to intubation; order for [place] or patient may have this done through pre-op, anesthesia, or PCP at [facility]

## 2016-10-18 NOTE — Assessment & Plan Note (Signed)
Systolic under 014, diastolic under 90

## 2016-10-18 NOTE — Assessment & Plan Note (Signed)
Managed by Dr. Fletcher Anon, 30% LAD in 2017 by CTA

## 2016-10-19 ENCOUNTER — Ambulatory Visit: Payer: Self-pay

## 2016-10-22 NOTE — Telephone Encounter (Signed)
Erroneous entry

## 2016-10-24 ENCOUNTER — Encounter: Payer: Self-pay | Admitting: Family Medicine

## 2016-10-24 ENCOUNTER — Telehealth: Payer: Self-pay | Admitting: Family Medicine

## 2016-10-24 DIAGNOSIS — E041 Nontoxic single thyroid nodule: Secondary | ICD-10-CM

## 2016-10-24 HISTORY — DX: Nontoxic single thyroid nodule: E04.1

## 2016-10-24 NOTE — Telephone Encounter (Signed)
She has appt with [facility] psychiatry in the morning at 9 am We discussed her thyroid nodule, need for biopsy, ENT Will try to move on this as quickly as possible She says she told anesthesia at pre-op about the neck mass and they checked her labs; I cannot see them in Care Everywhere ----------------------------- Robin Howard, please contact patient on Thursday (but not between 9-10 am, as she'll probably be in her psych appt then) See if you can get those lab results (done on 10/16/16); she can access them, but I can't see them, just need to see results please When schedule appt for ENT, make sure they know up front that she's scheduled to have back surgery on Monday and now has a 4.7 cm thyroid nodule that needs biopsy

## 2016-10-24 NOTE — Assessment & Plan Note (Signed)
Will refer to ENT for biopsy, management

## 2016-10-24 NOTE — Telephone Encounter (Signed)
Patient has a large nodule on her thyroid This will require biopsy She has [place] care, so this will need to coordinate with a provider at [facility]

## 2016-10-25 NOTE — Telephone Encounter (Signed)
I contacted the surgeon's office [phone] to inform them of your concerns and to see what their plan of action will be based upon recent imaging results, but there was no answer. A message was left on their voicemail with our office number and Dr. Delight Ovens cell number due to the unexpected weather.

## 2016-10-25 NOTE — Telephone Encounter (Signed)
The referral was placed with [facility] via [facility] Portal as STAT. I then called their office (protocal for all urgent/ stat orders) and spoke with to inform them how urgent this is and she said she will call the patient today.

## 2016-10-25 NOTE — Telephone Encounter (Signed)
The requested information has been printed from [facility] and placed on your desk.

## 2016-10-25 NOTE — Telephone Encounter (Signed)
So does she have an appt with [facility] ENT? This is what I need

## 2016-10-25 NOTE — Telephone Encounter (Signed)
Thank you for the labs

## 2016-10-26 ENCOUNTER — Encounter: Payer: Self-pay | Admitting: Family Medicine

## 2016-10-29 DIAGNOSIS — M5416 Radiculopathy, lumbar region: Secondary | ICD-10-CM | POA: Insufficient documentation

## 2016-10-29 HISTORY — PX: FORAMINOTOMY 1 LEVEL: SHX5835

## 2016-10-29 HISTORY — PX: LAMINECTOMY: SHX219

## 2016-10-29 HISTORY — PX: POSTERIOR LAMINECTOMY / DECOMPRESSION LUMBAR SPINE: SUR740

## 2016-11-01 ENCOUNTER — Encounter: Payer: Self-pay | Admitting: Family Medicine

## 2016-11-05 ENCOUNTER — Telehealth: Payer: Self-pay | Admitting: Family Medicine

## 2016-11-05 MED ORDER — FLUCONAZOLE 150 MG PO TABS: 150.0000 mg | ORAL_TABLET | Freq: Once | ORAL | 0 refills | Status: AC

## 2016-11-05 NOTE — Telephone Encounter (Signed)
Called pt informed her of the information below. Pt gave verbal understanding, will hold off on Simvastatin as directed. Advised pt to call back if no improvement in sx.

## 2016-11-05 NOTE — Telephone Encounter (Signed)
I sent the Rx for diflucan Advise her that diflucan plus cholesterol medicine (statins) should be given today; we want her to take the diflucan and NOT take her simvastatin for 3 days We hope she is doing better after her surgery

## 2016-11-05 NOTE — Telephone Encounter (Signed)
Pt has surgery on last Monday came home on Wednesday. The antibotic that was given has caused like a yeast infection (white blisters) in her mouth. She would like to know if you are able to call her something in. She is aware that you did not prescribe the medication. Uses [facility] [phone]

## 2016-11-05 NOTE — Telephone Encounter (Signed)
Pharmacy updated, no medication allergies to diflucan. Please advise

## 2016-11-20 ENCOUNTER — Encounter: Payer: Self-pay | Admitting: Family Medicine

## 2016-11-20 ENCOUNTER — Ambulatory Visit (INDEPENDENT_AMBULATORY_CARE_PROVIDER_SITE_OTHER): Payer: Self-pay | Admitting: Family Medicine

## 2016-11-20 VITALS — BP 140/70 | HR 99 | Temp 99.1°F | Ht 65.5 in | Wt 249.1 lb

## 2016-11-20 DIAGNOSIS — E041 Nontoxic single thyroid nodule: Secondary | ICD-10-CM

## 2016-11-20 DIAGNOSIS — M25551 Pain in right hip: Secondary | ICD-10-CM

## 2016-11-20 DIAGNOSIS — I951 Orthostatic hypotension: Secondary | ICD-10-CM

## 2016-11-20 DIAGNOSIS — I10 Essential (primary) hypertension: Secondary | ICD-10-CM

## 2016-11-20 DIAGNOSIS — I2510 Atherosclerotic heart disease of native coronary artery without angina pectoris: Secondary | ICD-10-CM

## 2016-11-20 DIAGNOSIS — K219 Gastro-esophageal reflux disease without esophagitis: Secondary | ICD-10-CM

## 2016-11-20 MED ORDER — METOPROLOL SUCCINATE ER 25 MG PO TB24: 25.0000 mg | ORAL_TABLET | Freq: Every day | ORAL | 0 refills | Status: DC

## 2016-11-20 MED ORDER — OMEPRAZOLE 20 MG PO CPDR: 20.0000 mg | DELAYED_RELEASE_CAPSULE | Freq: Every day | ORAL | 3 refills | Status: DC

## 2016-11-20 NOTE — Patient Instructions (Addendum)
Decrease the metoprolol to just 25 mg daily If your blood pressure goes up, increase your losartan from 25 mg to 50 mg daily if top number is over 140 mmHg consistently Stop the ranitidine Start omeprazole Stay well-hydrated Get up slowly when you go to stand Monitor your blood pressure a few times a day and keep me posted Try the DASH guidelines to help control your blood pressure without needing extra medicines  DASH Eating Plan DASH stands for "Dietary Approaches to Stop Hypertension." The DASH eating plan is a healthy eating plan that has been shown to reduce high blood pressure (hypertension). It may also reduce your risk for type 2 diabetes, heart disease, and stroke. The DASH eating plan may also help with weight loss. What are tips for following this plan? General guidelines  Avoid eating more than 2,300 mg (milligrams) of salt (sodium) a day. If you have hypertension, you may need to reduce your sodium intake to 1,500 mg a day.  Limit alcohol intake to no more than 1 drink a day for nonpregnant women and 2 drinks a day for men. One drink equals 12 oz of beer, 5 oz of wine, or 1 oz of hard liquor.  Work with your health care provider to maintain a healthy body weight or to lose weight. Ask what an ideal weight is for you.  Get at least 30 minutes of exercise that causes your heart to beat faster (aerobic exercise) most days of the week. Activities may include walking, swimming, or biking.  Work with your health care provider or diet and nutrition specialist (dietitian) to adjust your eating plan to your individual calorie needs. Reading food labels  Check food labels for the amount of sodium per serving. Choose foods with less than 5 percent of the Daily Value of sodium. Generally, foods with less than 300 mg of sodium per serving fit into this eating plan.  To find whole grains, look for the word "whole" as the first word in the ingredient list. Shopping  Buy products labeled  as "low-sodium" or "no salt added."  Buy fresh foods. Avoid canned foods and premade or frozen meals. Cooking  Avoid adding salt when cooking. Use salt-free seasonings or herbs instead of table salt or sea salt. Check with your health care provider or pharmacist before using salt substitutes.  Do not fry foods. Cook foods using healthy methods such as baking, boiling, grilling, and broiling instead.  Cook with heart-healthy oils, such as olive, canola, soybean, or sunflower oil. Meal planning   Eat a balanced diet that includes: ? 5 or more servings of fruits and vegetables each day. At each meal, try to fill half of your plate with fruits and vegetables. ? Up to 6-8 servings of whole grains each day. ? Less than 6 oz of lean meat, poultry, or fish each day. A 3-oz serving of meat is about the same size as a deck of cards. One egg equals 1 oz. ? 2 servings of low-fat dairy each day. ? A serving of nuts, seeds, or beans 5 times each week. ? Heart-healthy fats. Healthy fats called Omega-3 fatty acids are found in foods such as flaxseeds and coldwater fish, like sardines, salmon, and mackerel.  Limit how much you eat of the following: ? Canned or prepackaged foods. ? Food that is high in trans fat, such as fried foods. ? Food that is high in saturated fat, such as fatty meat. ? Sweets, desserts, sugary drinks, and other foods with  added sugar. ? Full-fat dairy products.  Do not salt foods before eating.  Try to eat at least 2 vegetarian meals each week.  Eat more home-cooked food and less restaurant, buffet, and fast food.  When eating at a restaurant, ask that your food be prepared with less salt or no salt, if possible. What foods are recommended? The items listed may not be a complete list. Talk with your dietitian about what dietary choices are best for you. Grains Whole-grain or whole-wheat bread. Whole-grain or whole-wheat pasta. Brown rice. [place]. Bulgur.  Whole-grain and low-sodium cereals. Pita bread. Low-fat, low-sodium crackers. Whole-wheat flour tortillas. Vegetables Fresh or frozen vegetables (raw, steamed, roasted, or grilled). Low-sodium or reduced-sodium tomato and vegetable juice. Low-sodium or reduced-sodium tomato sauce and tomato paste. Low-sodium or reduced-sodium canned vegetables. Fruits All fresh, dried, or frozen fruit. Canned fruit in natural juice (without added sugar). Meat and other protein foods Skinless chicken or [place]. Ground chicken or [place]. Pork with fat trimmed off. Fish and seafood. Egg whites. Dried beans, peas, or lentils. Unsalted nuts, nut butters, and seeds. Unsalted canned beans. Lean cuts of beef with fat trimmed off. Low-sodium, lean deli meat. Dairy Low-fat (1%) or fat-free (skim) milk. Fat-free, low-fat, or reduced-fat cheeses. Nonfat, low-sodium ricotta or cottage cheese. Low-fat or nonfat yogurt. Low-fat, low-sodium cheese. Fats and oils Soft margarine without trans fats. Vegetable oil. Low-fat, reduced-fat, or light mayonnaise and salad dressings (reduced-sodium). Canola, safflower, olive, soybean, and sunflower oils. Avocado. Seasoning and other foods Herbs. Spices. Seasoning mixes without salt. Unsalted popcorn and pretzels. Fat-free sweets. What foods are not recommended? The items listed may not be a complete list. Talk with your dietitian about what dietary choices are best for you. Grains Baked goods made with fat, such as croissants, muffins, or some breads. Dry pasta or rice meal packs. Vegetables Creamed or fried vegetables. Vegetables in a cheese sauce. Regular canned vegetables (not low-sodium or reduced-sodium). Regular canned tomato sauce and paste (not low-sodium or reduced-sodium). Regular tomato and vegetable juice (not low-sodium or reduced-sodium). Angie Fava. Olives. Fruits Canned fruit in a light or heavy syrup. Fried fruit. Fruit in cream or butter sauce. Meat and other protein  foods Fatty cuts of meat. Ribs. Fried meat. Berniece Salines. Sausage. Bologna and other processed lunch meats. Salami. Fatback. Hotdogs. Bratwurst. Salted nuts and seeds. Canned beans with added salt. Canned or smoked fish. Whole eggs or egg yolks. Chicken or [place] with skin. Dairy Whole or 2% milk, cream, and half-and-half. Whole or full-fat cream cheese. Whole-fat or sweetened yogurt. Full-fat cheese. Nondairy creamers. Whipped toppings. Processed cheese and cheese spreads. Fats and oils Butter. Stick margarine. Lard. Shortening. Ghee. Bacon fat. Tropical oils, such as coconut, palm kernel, or palm oil. Seasoning and other foods Salted popcorn and pretzels. Onion salt, garlic salt, seasoned salt, table salt, and sea salt. Worcestershire sauce. Tartar sauce. Barbecue sauce. Teriyaki sauce. Soy sauce, including reduced-sodium. Steak sauce. Canned and packaged gravies. Fish sauce. Oyster sauce. Cocktail sauce. Horseradish that you find on the shelf. Ketchup. Mustard. Meat flavorings and tenderizers. Bouillon cubes. Hot sauce and Tabasco sauce. Premade or packaged marinades. Premade or packaged taco seasonings. Relishes. Regular salad dressings. Where to find more information:  National Heart, Lung, and [place]: https://wilson-eaton.com/  American Heart Association: www.heart.org Summary  The DASH eating plan is a healthy eating plan that has been shown to reduce high blood pressure (hypertension). It may also reduce your risk for type 2 diabetes, heart disease, and stroke.  With the DASH  eating plan, you should limit salt (sodium) intake to 2,300 mg a day. If you have hypertension, you may need to reduce your sodium intake to 1,500 mg a day.  When on the DASH eating plan, aim to eat more fresh fruits and vegetables, whole grains, lean proteins, low-fat dairy, and heart-healthy fats.  Work with your health care provider or diet and nutrition specialist (dietitian) to adjust your eating plan to your  individual calorie needs. This information is not intended to replace advice given to you by your health care provider. Make sure you discuss any questions you have with your health care provider. Document Released: 12/21/2010 Document Revised: 12/26/2015 Document Reviewed: 12/26/2015 Elsevier Interactive Patient Education  2017 [employer].

## 2016-11-20 NOTE — Assessment & Plan Note (Signed)
Now with two events of orthostatic hypotension; reduce beta-blocker and will have her check BP at home a few times a day; will plan to increase the ARB if needed; hydrate; once off of narcotics, could likely go back to previous regimen; informing cardiologist of issues

## 2016-11-20 NOTE — Assessment & Plan Note (Signed)
Stop the H2 blocker if ineffective; start back on PPI, but I strongly cautioned patient about risk of serotonin syndrome; hand-out given from the [facility]

## 2016-11-20 NOTE — Assessment & Plan Note (Signed)
Managed by cardiologist; I did decrease her beta-blocker today after she had two episodes of significant orthostatic hypotension while on narcotics; I believe she was unable to compensate and therefore I am reducing her metoprolol succinate from 50 mg daily to 25 mg for now; sending note to cardiologist to let him know situation

## 2016-11-20 NOTE — Assessment & Plan Note (Signed)
Status post biopsy; patient planning to have nodule removed, but needs clearance from ortho first before ENT will proceed

## 2016-11-20 NOTE — Progress Notes (Signed)
BP 140/70 (BP Location: Right Arm, Patient Position: Sitting, Cuff Size: Large)   Pulse 99   Temp 99.1 F (37.3 C) (Oral)   Ht 5' 5.5" (1.664 m)   Wt 249 lb 1.6 oz (113 kg)   LMP 08/26/2012 (Exact Date)   SpO2 98%   BMI 40.82 kg/m    Today's Vitals   11/20/16 0825 11/20/16 0826  BP: 140/70   Pulse: 99   Temp: 99.1 F (37.3 C)   TempSrc: Oral   SpO2: 98%   Weight: 249 lb 1.6 oz (113 kg)   Height: 5' 5.5" (1.664 m)   PainSc: 4  4     Subjective:    Patient ID: Robin Howard, female    DOB: 06-01-1960, 56 y.o.   MRN: [id number]  HPI: Robin Howard is a 56 y.o. female    Chief Complaint  Patient presents with  . Hypertension    Pt states she had surgery on the 15th, since then BP has been running high and sometimes very low   . Loss of Consciousness    Pt states she has passed out twice  . Numbness    Pt c/o some numbness in fingers   . Gastroesophageal Reflux    zantac not working    HPI Since last visit, she has undergone surgery, s/p lumbar decompression and discectomy mid-October through [facility]  She had recurrence of pain and the surgeon has ordered another MRI of lumbar spine and right hip She had xrays done on 11/13/16 of her right hip, lumbar spine, and right femur; reviewed reports in [facility]; she saw Dr. Idamae Schuller on 11/13/16; he recommended lidocaine 4% OTC patches; also gave her oxycodone; he thinks she might have a hairline fracture or compressed something from falling; he thinks that the pain medicine contributed to her passing out  Copied and pasted from 11/13/16: ASSESSMENT:  1. 2 weeks status post lumbar decompression/discectomy with flare of pain  PLAN:  I reviewed the clinical and imaging findings with the patient. She initially did well postoperatively, however she had acute increase in low back pain as well as right eg pain. She states that radiates through her thigh and this could be consistent with recurrence of a lumbar disc  herniation. She denies any bowel or bladder issues or weakness, however given the increased pain, I will likely obtain a new MRI with contrast to better evaluate for any type of recurrence of disc. She does have some pain with weightbearing and although her radiographs of her hip and femur were unremarkable, I would like to obtain an MRI of her right hip to rule out a nondisplaced or stress fracture the femoral neck. I will see her back after the updated imaging is performed.  ------------------------------------------------------------- XR lumbar spine FINDINGS:  For the purposes of this dictation, there are 5 nonrib-bearing lumbar-type vertebral bodies. T12 has diminutive ribs and there is a transitional lumbosacral vertebral segment with fused assimilation joint on the right at L5. Partially formed L5-S1 intervertebral disc. Unchanged mild right dextrocurvature. Interval laminectomies of L3-L5. No acute fracture. No listhesis. Unchanged mild loss of the disc heights. SI joints are approximated. No erosions. Calcifications of the lesion is abdominal aorta. ---------------------------------------------------------------- XR hip with pelvis RIGHT FINDINGS:  Laminectomy defects are better defined on the radiographs of the spine.  Joint width is maintained at the hips, sacral a joints and symphysis, and there is no evidence for recent fracture, lytic or blastic lesion or inflammatory spondyloarthropathy or  mass.  IMPRESSION: No acute osseous abnormality at the hips.  Previous lumbar laminectomies. ------------------------------------------------------------------- XR femur RIGHT FINDINGS:  There is no fracture, dislocation, lytic or blastic lesion or evidence for inflammatory or erosive arthropathy.There is moderate osseous overgrowth at the knee medially. -------------------------------------------------------------------- She goes back to see Dr. Gerome Apley on 11/27/16 Since surgery, her blood  pressure has been variable, sometimes high and sometimes low She told the CMA that she has "passed out twice" when BP was 88/65 and 85/60; she was at home; she did not call 911; she has a friend come and help her bathe now; doesn't feel comfortable doing it on her own Highest BP was 150/92 Another low BP without passing out was 95/62 on Saturday She decreased her beta-blocker to 50 mg daily Continued the losartan  She has numbness in her fingers as well; fingers go to sleep; resting elbows on the armrest; she does have pinched nerves in her neck too, though  She is having GERD and the zantac is not working adequately; getting worse Triggers include: avoids the typical foods anyway; no real triggers Eats peanut butter sandwiches She is on oxycodone Not taking cyclobenzapril regularly Not taking odansetron She finished the dose pak yesterday; has been on 3 dosepaks since leaving the hospital  She has an appt with psychiatrist at [facility] on 11/29/16, Dr. Maryfrances Bunnell  She is not on simvastatin for her cholesterol she says; it is for coronary artery   She had the biopsy of her thyroid nodule; her father had a growth and it was normal on biopsy but it was liposarcoma when they took it out; she is thinking about having the thyroid nodule removed at [facility]  No fevers  Depression screen [facility] 2/9 11/20/2016 10/15/2016 08/22/2016 08/22/2016 06/22/2016  Decreased Interest 1 1 1  0 0  Down, Depressed, Hopeless 1 1 1  0 0  PHQ - 2 Score 2 2 2  0 0  Altered sleeping 0 1 2 - -  Tired, decreased energy 3 - 3 - -  Change in appetite 1 0 0 - -  Feeling bad or failure about yourself  1 0 1 - -  Trouble concentrating 3 1 3  - -  Moving slowly or fidgety/restless 0 1 3 - -  Suicidal thoughts 0 0 1 - -  PHQ-9 Score 10 5 15  - -  Difficult doing work/chores Very difficult Not difficult at all Somewhat difficult - -   Relevant past medical, surgical, family and social history reviewed Past Medical History:    Diagnosis Date  . Anemia    h/o  . Breast cancer ([place]) 07/2014   T1c,N0, ER/ PR negative, Her 2 neu positive. Wide excision, SLN, Mammosite, adjuvant chemotherapy.   . Bronchitis 04-2014  . Chronic headache   . Coronary artery disease     small non-ST elevation myocardial infarction in April 2016 in January 2017. Cardiac catheterization in April 2016 showed mild nonobstructive disease affecting the LAD with tortuous vessels. CTA in 2017 showed only 30% disease in the LAD.  Marland Kitchen Detached retina 1997  . Family history of adverse reaction to anesthesia    pts dad has pseudocholinestrace deficieny  . Gastritis   . GERD (gastroesophageal reflux disease)   . HTN (hypertension)   . Hypothyroidism   . Insomnia   . Last menstrual period (LMP) > 10 days ago    LMP 2014  . Major depressive disorder, recurrent ([place])   . Migraines   . Morbid obesity ([place])   .  NSTEMI (non-ST elevated myocardial infarction) ([place]) 04/16/14   Due to demand- normal Cath 04/19/14  . NSTEMI (non-ST elevated myocardial infarction) ([place]) 01-24-15  . Obesity (BMI 35.0-39.9 without comorbidity) 03/04/2016  . OSA (obstructive sleep apnea)    untreated due to lack of insurance  . Overactive bladder   . Pericarditis 1997  . Pericarditis 1997  . Shortness of breath dyspnea    with exertion only  . Thyroid nodule 10/24/2016   4.7 cm, October 2018; referred for biopsy   Past Surgical History:  Procedure Laterality Date  . BACK SURGERY     L4-5  . BRE[facility]SY [facility]   [facility]  . BREAST CYST ASPIRATION Left 12/13/2014   3:00 position 2 cmfn, fat necrosis per Dr. Dwyane Luo notes  . CESAREAN SECTION    . CORONARY ANGIOPLASTY  April 2016  . EYE SURGERY Left 10/16/2015   retina repair  . FOOT SURGERY    . FORAMINOTOMY 1 LEVEL  [id number]  . KNEE SURGERY    . LAMINECTOMY  [id number]  . POSTERIOR LAMINECTOMY / DECOMPRESSION LUMBAR SPINE  10/29/2016   L3-5  . RETINAL DETACHMENT SURGERY Right 1997   Family History  Problem  Relation Age of Onset  . Alcohol abuse Mother   . Thyroid disease Mother   . Hypertension Mother   . COPD Mother   . Cancer Father        liposarcoma  . Congestive Heart Failure Father        V Tach  . Depression Father   . Hypertension Father   . Heart attack Father   . Arrhythmia Father   . Heart failure Father   . Cancer Maternal Uncle        Pancreatic cancer  . Heart disease Paternal Grandfather 56       MI  . Diabetes Other   . Cancer Sister        breast  . Breast cancer Sister 81   Social History   Socioeconomic History  . Marital status: Divorced    Spouse name: Not on file  . Number of children: Not on file  . Years of education: Not on file  . Highest education level: Not on file  Social Needs  . Financial resource strain: Not on file  . Food insecurity - worry: Not on file  . Food insecurity - inability: Not on file  . Transportation needs - medical: Not on file  . Transportation needs - non-medical: Not on file  Occupational History  . Not on file  Tobacco Use  . Smoking status: Never Smoker  . Smokeless tobacco: Never Used  Substance and Sexual Activity  . Alcohol use: No  . Drug use: No  . Sexual activity: Not Currently  Other Topics Concern  . Not on file  Social History Narrative  . Not on file   Interim medical history since last visit reviewed. Allergies and medications reviewed  Review of Systems Per HPI unless specifically indicated above     Objective:    BP 140/70 (BP Location: Right Arm, Patient Position: Sitting, Cuff Size: Large)   Pulse 99   Temp 99.1 F (37.3 C) (Oral)   Ht 5' 5.5" (1.664 m)   Wt 249 lb 1.6 oz (113 kg)   LMP 08/26/2012 (Exact Date)   SpO2 98%   BMI 40.82 kg/m   Wt Readings from Last 3 Encounters:  11/20/16 249 lb 1.6 oz (113 kg)  10/15/16 243 lb 9.6 oz (110.5  kg)  09/14/16 230 lb (104.3 kg)  MD note: orthostatics checked today (BP only, CMAs did not get pulse); not orthostatic based on pressures  alone  Physical Exam  Constitutional: She appears well-developed and well-nourished. No distress.  Cardiovascular: Normal rate and regular rhythm.  No murmur heard. Pulmonary/Chest: Effort normal and breath sounds normal.  Musculoskeletal:  Surgical incision site lower back midline c/d/i; no erythema; no drainage  Skin: She is not diaphoretic. No pallor.  Psychiatric: Her mood appears not anxious. She does not exhibit a depressed mood.   Results for orders placed or performed during the hospital encounter of 09/14/16  CBC  Result Value Ref Range   WBC 6.2 3.6 - 11.0 K/uL   RBC 4.17 3.80 - 5.20 MIL/uL   Hemoglobin 12.9 12.0 - 16.0 g/dL   HCT 37.8 35.0 - 47.0 %   MCV 90.7 80.0 - 100.0 fL   MCH 31.1 26.0 - 34.0 pg   MCHC 34.3 32.0 - 36.0 g/dL   RDW 13.9 11.5 - 14.5 %   Platelets 252 150 - 440 K/uL  Comprehensive metabolic panel  Result Value Ref Range   Sodium 141 135 - 145 mmol/L   Potassium 3.8 3.5 - 5.1 mmol/L   Chloride 107 101 - 111 mmol/L   CO2 24 22 - 32 mmol/L   Glucose, Bld 104 (H) 65 - 99 mg/dL   BUN 17 6 - 20 mg/dL   Creatinine, Ser 1.14 (H) 0.44 - 1.00 mg/dL   Calcium 9.7 8.9 - 10.3 mg/dL   Total Protein 7.3 6.5 - 8.1 g/dL   Albumin 4.3 3.5 - 5.0 g/dL   AST 34 15 - 41 U/L   ALT 22 14 - 54 U/L   Alkaline Phosphatase 82 38 - 126 U/L   Total Bilirubin 1.5 (H) 0.3 - 1.2 mg/dL   GFR calc non Af Amer 54 (L) >60 mL/min   GFR calc Af Amer >60 >60 mL/min   Anion gap 10 5 - 15  TSH  Result Value Ref Range   TSH 2.879 0.350 - 4.500 uIU/mL  T4, free  Result Value Ref Range   Free T4 0.90 0.61 - 1.12 ng/dL  T3 uptake  Result Value Ref Range   T3 Uptake Ratio 25 24 - 39 %      Assessment & Plan:   Problem List Items Addressed This Visit      Cardiovascular and Mediastinum   HTN (hypertension)    Now with two events of orthostatic hypotension; reduce beta-blocker and will have her check BP at home a few times a day; will plan to increase the ARB if needed;  hydrate; once off of narcotics, could likely go back to previous regimen; informing cardiologist of issues      Relevant Medications   metoprolol succinate (TOPROL-XL) 25 MG 24 hr tablet   Coronary artery disease    Managed by cardiologist; I did decrease her beta-blocker today after she had two episodes of significant orthostatic hypotension while on narcotics; I believe she was unable to compensate and therefore I am reducing her metoprolol succinate from 50 mg daily to 25 mg for now; sending note to cardiologist to let him know situation      Relevant Medications   metoprolol succinate (TOPROL-XL) 25 MG 24 hr tablet     Digestive   GERD (gastroesophageal reflux disease)    Stop the H2 blocker if ineffective; start back on PPI, but I strongly cautioned patient about risk of serotonin  syndrome; hand-out given from the [facility]      Relevant Medications   polyethylene glycol (MIRALAX / GLYCOLAX) packet   omeprazole (PRILOSEC) 20 MG capsule     Endocrine   Thyroid nodule    Status post biopsy; patient planning to have nodule removed, but needs clearance from ortho first before ENT will proceed      Relevant Medications   metoprolol succinate (TOPROL-XL) 25 MG 24 hr tablet    Other Visit Diagnoses    Orthostasis    -  Primary   two episodes of orthostatic hypotension, suspect beta-blocker prevented compensation; decrease beta-blocker for now; hydrate   Relevant Medications   metoprolol succinate (TOPROL-XL) 25 MG 24 hr tablet   Hip pain, acute, right       undergoing work-up       Follow up plan: No Follow-up on file.  An after-visit summary was printed and given to the patient at [place].  Please see the patient instructions which may contain other information and recommendations beyond what is mentioned above in the assessment and plan.  Meds ordered this encounter  Medications  . DISCONTD: Multiple Vitamins-Minerals (MULTIVITAL) tablet    Sig: Take by mouth.  .  DISCONTD: buPROPion (WELLBUTRIN XL) 150 MG 24 hr tablet    Sig: Take 150 mg daily by mouth.   . cyclobenzaprine (FLEXERIL) 5 MG tablet    Sig: Take 5-10 mg 3 (three) times daily by mouth.   . DISCONTD: diclofenac sodium (VOLTAREN) 1 % GEL    Sig: Apply 2 g as needed topically.   Marland Kitchen DISCONTD: diphenhydrAMINE (BENADRYL) 25 MG tablet    Sig: Take 25 mg as needed by mouth.   . DISCONTD: lidocaine (LIDODERM) 5 %    Sig: Place 1 patch daily onto the skin.   Marland Kitchen DISCONTD: methylPREDNISolone (MEDROL DOSEPAK) 4 MG TBPK tablet    Sig: follow package directions  . DISCONTD: methylPREDNISolone (MEDROL DOSEPAK) 4 MG TBPK tablet    Sig: follow package directions  . DISCONTD: ondansetron (ZOFRAN-ODT) 4 MG disintegrating tablet    Sig: Take 4 mg every 8 (eight) hours as needed by mouth.   . oxyCODONE (OXY IR/ROXICODONE) 5 MG immediate release tablet    Sig: Take 5-10 mg every 4 (four) hours as needed by mouth.   . polyethylene glycol (MIRALAX / GLYCOLAX) packet    Sig: Take 17 g daily by mouth.   . DISCONTD: senna (SENOKOT) 8.6 MG tablet    Sig: Take by mouth.  . DISCONTD: traZODone (DESYREL) 50 MG tablet    Sig: Take 50 mg daily by mouth.   . metoprolol succinate (TOPROL-XL) 25 MG 24 hr tablet    Sig: Take 1 tablet (25 mg total) daily by mouth. Take with or immediately following a meal.    Dispense:  90 tablet    Refill:  0  . acetaminophen (TYLENOL) 500 MG tablet    Sig: Take 2 tablets (1,000 mg total) every 6 (six) hours as needed by mouth for mild pain or headache. Maximum dose of acetaminophen 3,000 mg total per day  . traZODone (DESYREL) 50 MG tablet    Sig: Take 1 tablet (50 mg total) at bedtime as needed by mouth for sleep.  Marland Kitchen omeprazole (PRILOSEC) 20 MG capsule    Sig: Take 1 capsule (20 mg total) daily by mouth.    Dispense:  30 capsule    Refill:  3    No orders of the defined types were placed in  this encounter.

## 2016-11-28 ENCOUNTER — Encounter: Payer: Self-pay | Admitting: Family Medicine

## 2016-12-15 DIAGNOSIS — C73 Malignant neoplasm of thyroid gland: Secondary | ICD-10-CM

## 2016-12-15 HISTORY — DX: Malignant neoplasm of thyroid gland: C73

## 2016-12-17 ENCOUNTER — Ambulatory Visit: Payer: Self-pay | Admitting: Physician Assistant

## 2016-12-19 HISTORY — PX: TOTAL THYROIDECTOMY: SHX2547

## 2016-12-24 ENCOUNTER — Other Ambulatory Visit: Payer: Self-pay | Admitting: Family Medicine

## 2016-12-24 NOTE — Telephone Encounter (Signed)
Lab Results  Component Value Date   TSH 2.879 09/14/2016

## 2016-12-26 ENCOUNTER — Encounter: Payer: Self-pay | Admitting: Physician Assistant

## 2016-12-26 NOTE — Progress Notes (Deleted)
Cardiology Office Note Date:  12/26/2016  Patient ID:  Robin, Howard Nov 11, 1960, MRN [id number] PCP:  Arnetha Courser, MD  Cardiologist:  Dr. Fletcher Anon, MD  ***refresh   Chief Complaint: Follow up  History of Present Illness: Robin Howard is a 56 y.o. female with history of mild nonobstructive CAD by cardiac cath in 04/2014 with normal EF, breast cancer in 07/2015 s/p lumpectomy followed by chemotherapy, completing Herceptin in the fall of 2017. Also with history of HTN, HLD, hypothyroidism, and obesity who presents for routine follow up for mild nonobstructive CAD.   Most recent MUGA scan from 07/2015 showed stable EF of 56%. She was seen by Dr. Fletcher Anon in 10/2015 for routine follow up and was doing well at that time. She underwent colonoscopy in 04/2016 that was normal.    Previously admitted to the hospital and 04/2014 with chest pain found to have mild troponin elevation with peak of 0.50. Cardiac catheterization done at that time by outside group apparently showed nonobstructive CAD (report not available for review at this time). She again presented to the hospital with recurrence of chest pain in 01/2015 and was found to have elevated troponin with peak of 1.02 and started on heparin drip. She was consulted on by her cardiologist at that time who recommended discontinuation of heparin drip, patient discharge, and outpatient nuclear stress testing. Per patient report she followed up with the stress test though we do not have results for review at this time. She also states she had what sounds to be a coronary artery CT with a self-reported "30% blockage." She was seen by myself in 06/2016, and was doing well at that time. She did note occasional intermittent sharp substernal chest pain that was not associated with exertion or rest. She underwent Lexiscan Myoview on 06/27/16 that showed no significant ischemia, normal wall motion, EF 54%. Low risk scan.  ***   Past Medical History:  Diagnosis Date   . Anemia    h/o  . Breast cancer ([place]) 07/2014   T1c,N0, ER/ PR negative, Her 2 neu positive. Wide excision, SLN, Mammosite, adjuvant chemotherapy.   . Bronchitis 04-2014  . Coronary artery disease, non-occlusive    a. LHC 4/16 w/ mild nonobstructive disease affecting the LAD with tortuous vessels. CTA in 2017 showed only 30% disease in the LAD. bCarlton Adam MV 6/18: neg for signficant ischemia, EF 54%, low risk scan  . Detached retina 1997  . Family history of adverse reaction to anesthesia    pts dad has pseudocholinestrace deficieny  . Gastritis   . GERD (gastroesophageal reflux disease)   . HTN (hypertension)   . Hypothyroidism   . Insomnia   . Last menstrual period (LMP) > 10 days ago    LMP 2014  . Major depressive disorder, recurrent ([place])   . Migraines   . Morbid obesity ([place])   . OSA (obstructive sleep apnea)    untreated due to lack of insurance  . Overactive bladder   . Pericarditis 1997  . Thyroid nodule 10/24/2016   4.7 cm, October 2018; referred for biopsy    Past Surgical History:  Procedure Laterality Date  . BACK SURGERY     L4-5  . BREAST [facility]OPSY [facility]2014   [facility]  . BREAST CYST ASPIRATION Left 12/13/2014   3:00 position 2 cmfn, fat necrosis per Dr. Dwyane Luo notes  . BREAST LUMPECTOMY WITH SENTINEL LYMPH NODE BIOPSY Left 08/13/2014   Procedure: BREAST LUMPECTOMY WITH SENTINEL LYMPH NODE BIOPSY;  Surgeon: Robert Bellow, MD;  Location: [facility] ORS;  Service: General;  Laterality: Left;  . BREAST MAMMOSITE Left 08/30/2014   Procedure: MAMMOSITE BREAST;  Surgeon: Robert Bellow, MD;  Location: [facility] ORS;  Service: General;  Laterality: Left;  . CESAREAN SECTION    . COLONOSCOPY WITH PROPOFOL N/A 04/18/2016   Procedure: COLONOSCOPY WITH PROPOFOL;  Surgeon: Robert Bellow, MD;  Location[facility] ENDOSCOPY;  Service: Endoscopy;  Laterality: N/A;  . CORONARY ANGIOPLASTY  April 2016  . EYE SURGERY Left 10/16/2015   retina repair  . FOOT SURGERY    .  FORAMINOTOMY 1 LEVEL  [id number]  . KNEE SURGERY    . LAMINECTOMY  [id number]  . PORTACATH PLACEMENT Right 08/30/2014   Procedure: INSERTION PORT-A-CATH;  Surgeon: Robert Bellow, MD;  Location: [facility] ORS;  Service: General;  Laterality: Right;  . POSTERIOR LAMINECTOMY / DECOMPRESSION LUMBAR SPINE  10/29/2016   L3-5  . RETINAL DETACHMENT SURGERY Right 1997    No outpatient medications have been marked as taking for the 12/27/16 encounter (Appointment) with Rise Mu, PA-C.    Allergies:   Reglan [metoclopramide]; Paxil [paroxetine hcl]; Other; Tape; Tegaderm ag mesh [silver]; and Ultram [tramadol]   Social History:  The patient  reports that  has never smoked. she has never used smokeless tobacco. She reports that she does not drink alcohol or use drugs.   Family History:  The patient's family history includes Alcohol abuse in her mother; Arrhythmia in her father; Breast cancer (age of onset: 69) in her sister; COPD in her mother; Cancer in her father, maternal uncle, and sister; Congestive Heart Failure in her father; Depression in her father; Diabetes in her other; Heart attack in her father; Heart disease (age of onset: 8) in her paternal grandfather; Heart failure in her father; Hypertension in her father and mother; Thyroid disease in her mother.  ROS:   ROS   PHYSICAL EXAM: *** VS:  LMP 08/26/2012 (Exact Date)  BMI: There is no height or weight on file to calculate BMI.  Physical Exam    EKG:  Was ordered and interpreted by me today. Shows ***  Recent Labs: 09/14/2016: ALT 22; BUN 17; Creatinine, Ser 1.14; Hemoglobin 12.9; Platelets 252; Potassium 3.8; Sodium 141; TSH 2.879  08/22/2016: Cholesterol 162; HDL 47; LDL Cholesterol 78; Total CHOL/HDL Ratio 3.4; Triglycerides 187; VLDL 37   CrCl cannot be calculated (Patient's most recent lab result is older than the maximum 21 days allowed.).   Wt Readings from Last 3 Encounters:  11/20/16 249 lb 1.6 oz (113 kg)  10/15/16 243 lb  9.6 oz (110.5 kg)  09/14/16 230 lb (104.3 kg)     Other studies reviewed: Additional studies/records reviewed today include: summarized above  ASSESSMENT AND PLAN:  1. ***  Disposition: F/u with *** in   Current medicines are reviewed at length with the patient today.  The patient did not have any concerns regarding medicines.  Melvern Banker PA-C 12/26/2016 11:35 AM     [place] [address] [place],  [phone]

## 2016-12-27 ENCOUNTER — Ambulatory Visit: Payer: Self-pay | Admitting: Physician Assistant

## 2016-12-31 ENCOUNTER — Ambulatory Visit: Payer: Medicaid Other | Admitting: Radiation Oncology

## 2017-01-02 DIAGNOSIS — E890 Postprocedural hypothyroidism: Secondary | ICD-10-CM | POA: Insufficient documentation

## 2017-01-02 DIAGNOSIS — C73 Malignant neoplasm of thyroid gland: Secondary | ICD-10-CM | POA: Insufficient documentation

## 2017-01-17 NOTE — Progress Notes (Signed)
Cardiology Office Note Date:  01/22/2017  Patient ID:  Robin Howard 04/15/1960, MRN [id number] PCP:  Arnetha Courser, MD  Cardiologist:  Dr. Fletcher Anon, MD    Chief Complaint: Follow up  History of Present Illness: Robin Howard is a 57 y.o. female with history of mild nonobstructive CAD by cardiac cath in 04/2014 with normal EF, breast cancer in 07/2015 s/p lumpectomy followed by chemotherapy, completing Herceptin in the fall of 2017. 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   MUGA scan from 07/2015 showed stable EF of 56%. She was seen by Dr. Fletcher Anon in 10/2015 for routine follow up and was doing well at that time. She underwent colonoscopy in 04/2016 that was normal.     Previously admitted to the hospital and 04/2014 with chest pain found to have mild troponin elevation with peak of 0.50. Cardiac catheterization done at that time by outside group apparently showed nonobstructive CAD (report not available for review at this time). She again presented to the hospital with recurrence of chest pain in 01/2015 and was found to have elevated troponin with peak of 1.02 and started on heparin drip. She was consulted on by her cardiologist at that time who recommended discontinuation of heparin drip, patient discharge, and outpatient nuclear stress testing. Per patient report she followed up with the stress test though we do not have results for review at this time. She also stated she had what sounds to be a coronary artery CT with a self-reported "30% blockage." She was seen by myself in 06/2016, and was doing well at that time. She did note occasional intermittent sharp substernal chest pain that was not associated with exertion or rest. She underwent Lexiscan  Myoview on 06/27/16 that showed no significant ischemia, normal wall motion, EF 54%. Low risk scan. She underwent successful lumbar laminectomy in 10/2016. Since that time, she has had recurrence of lumbar and hip pain with increase in pain medication. She has reported 2 syncopal episodes since her surgery in October, 2018. Blood pressure has been labile, ranging from the 62X to 528U systolic. Her syncopal episodes were felt to be orthostatic in etiology in the setting of increased pain medications. Her beta blocker was decreased by PCP.   She comes in today feeling somewhat overwhelmed and anxious.  She has been through a lot recently as detailed above.  She is anxious regarding her planned yearly cancer screenings given her recent diagnosis of thyroid cancer.  She continues to feel intermittently dizzy and notes this with positional changes as well as with ambulation.  Per her report, blood pressure readings at home have been in the 132G systolic.  She has continued to take losartan 25 mg daily and Toprol 25 mg daily (each typically midday).  She has not yet taken her antihypertensives today.  She has not taken Flexeril or narcotic pain medication for the past several weeks to 1 month.  She reports adequate hydration though does note a "lump in my throat" when drinking water (does not note this with other fluids).  She does note intermittent sharp chest pain that typically self resolves.  Currently chest pain-free.  Does note dizziness while ambulating in from the parking lot today.   Past Medical History:  Diagnosis Date  . Anemia    h/o  .  Breast cancer ([place]) 07/2014   T1c,N0, ER/ PR negative, Her 2 neu positive. Wide excision, SLN, Mammosite, adjuvant chemotherapy.   . Bronchitis 04-2014  . Coronary artery disease, non-occlusive    a. LHC 4/16 w/ mild nonobstructive disease affecting the LAD with tortuous vessels. CTA in 2017 showed only 30% disease in the LAD. bCarlton Adam MV 6/18: neg for signficant  ischemia, EF 54%, low risk scan  . Detached retina 1997  . Family history of adverse reaction to anesthesia    pts dad has pseudocholinestrace deficieny  . Gastritis   . GERD (gastroesophageal reflux disease)   . HTN (hypertension)   . Hypothyroidism   . Insomnia   . Last menstrual period (LMP) > 10 days ago    LMP 2014  . Major depressive disorder, recurrent ([place])   . Migraines   . Morbid obesity ([place])   . OSA (obstructive sleep apnea)    untreated due to lack of insurance  . Overactive bladder   . Pericarditis 1997  . Thyroid nodule 10/24/2016   4.7 cm, October 2018; referred for biopsy    Past Surgical History:  Procedure Laterality Date  . BACK SURGERY     L4-5  . BREAST BIOPSY Left 07/2014   [facility]  . BREAST CYST ASPIRATION Left 12/13/2014   3:00 position 2 cmfn, fat necrosis per Dr. Dwyane Luo notes  . BREAST LUMPECTOMY WITH SENTINEL LYMPH NODE BIOPSY Left 08/13/2014   Procedure: BREAST LUMPECTOMY WITH SENTINEL LYMPH NODE BIOPSY;  Surgeon: Robert Bellow, MD;  Location: [facility] ORS;  Service: General;  Laterality: Left;  . BREAST MAMMOSITE Left 08/30/2014   Procedure: MAMMOSITE BREAST;  Surgeon: Robert Bellow, MD;  Location: [facility] ORS;  Service: General;  Laterality: Left;  . CESAREAN SECTION    . COLONOSCOPY WITH PROPOFOL N/A 04/18/2016   Procedure: COLONOSCOPY WITH PROPOFOL;  Surgeon: Robert Bellow, MD;  Location: [facility] ENDOSCOPY;  Service: Endoscopy;  Laterality: N/A;  . CORONARY ANGIOPLASTY  April 2016  . EYE SURGERY Left 10/16/2015   retina repair  . FOOT SURGERY    . FORAMINOTOMY 1 LEVEL  [id number]  . KNEE SURGERY    . LAMINECTOMY  [id number]  . PORTACATH PLACEMENT Right 08/30/2014   Procedure: INSERTION PORT-A-CATH;  Surgeon: Robert Bellow, MD;  Location: [facility] ORS;  Service: General;  Laterality: Right;  . POSTERIOR LAMINECTOMY / DECOMPRESSION LUMBAR SPINE  10/29/2016   L3-5  . RETINAL DETACHMENT SURGERY Right 1997  . TOTAL THYROIDECTOMY  12/19/2016     Current Meds  Medication Sig  . acetaminophen (TYLENOL) 500 MG tablet Take 2 tablets (1,000 mg total) every 6 (six) hours as needed by mouth for mild pain or headache. Maximum dose of acetaminophen 3,000 mg total per day  . aspirin EC 81 MG tablet Take 81 mg by mouth daily.  Marland Kitchen buPROPion (WELLBUTRIN SR) 150 MG 12 hr tablet Take 150 mg by mouth daily.  . cyclobenzaprine (FLEXERIL) 5 MG tablet Take 5-10 mg 3 (three) times daily by mouth.   . DULoxetine (CYMBALTA) 60 MG capsule Take 1 capsule (60 mg total) by mouth daily.  . fluticasone (FLONASE) 50 MCG/ACT nasal spray Place 2 sprays into both nostrils daily. (Patient taking differently: Place 2 sprays as needed into both nostrils. )  . loratadine (CLARITIN) 10 MG tablet Take 10 mg by mouth daily as needed.   Marland Kitchen losartan (COZAAR) 25 MG tablet TAKE 1 TABLET BY MOUTH ONCE DAILY  . metoprolol succinate (TOPROL-XL) 25 MG 24 hr  tablet Take 1 tablet (25 mg total) daily by mouth. Take with or immediately following a meal.  . omeprazole (PRILOSEC) 20 MG capsule Take 1 capsule (20 mg total) daily by mouth.  . simvastatin (ZOCOR) 10 MG tablet Take 1 tablet (10 mg total) by mouth daily. (Patient taking differently: Take 10 mg by mouth at bedtime. )  . traZODone (DESYREL) 50 MG tablet Take 100 mg by mouth at bedtime as needed for sleep.     Allergies:   Reglan [metoclopramide]; Paxil [paroxetine hcl]; Other; Tape; Tegaderm ag mesh [silver]; and Ultram [tramadol]   Social History:  The patient  reports that  has never smoked. she has never used smokeless tobacco. She reports that she does not drink alcohol or use drugs.   Family History:  The patient's family history includes Alcohol abuse in her mother; Arrhythmia in her father; Breast cancer (age of onset: 13) in her sister; COPD in her mother; Cancer in her father, maternal uncle, and sister; Congestive Heart Failure in her father; Depression in her father; Diabetes in her other; Heart attack in her  father; Heart disease (age of onset: 2) in her paternal grandfather; Heart failure in her father; Hypertension in her father and mother; Thyroid disease in her mother.  ROS:   Review of Systems  Constitutional: Positive for malaise/fatigue. Negative for chills, diaphoresis, fever and weight loss.  HENT: Negative for congestion.   Eyes: Negative for discharge and redness.  Respiratory: Negative for cough, hemoptysis, sputum production, shortness of breath and wheezing.   Cardiovascular: Positive for chest pain. Negative for palpitations, orthopnea, claudication, leg swelling and PND.  Gastrointestinal: Negative for abdominal pain, blood in stool, heartburn, melena, nausea and vomiting.  Genitourinary: Negative for hematuria.  Musculoskeletal: Positive for back pain, joint pain, myalgias and neck pain. Negative for falls.  Skin: Negative for rash.  Neurological: Positive for dizziness, tingling, sensory change and weakness. Negative for tremors, speech change, focal weakness, seizures and loss of consciousness.  Endo/Heme/Allergies: Does not bruise/bleed easily.  Psychiatric/Behavioral: Positive for depression and memory loss. Negative for substance abuse. The patient is nervous/anxious.   All other systems reviewed and are negative.    PHYSICAL EXAM:  VS:  BP 100/72 (BP Location: Right Arm, Patient Position: Sitting, Cuff Size: Large)   Pulse 72   Ht 5' 5.5" (1.664 m)   Wt 248 lb 8 oz (112.7 kg)   LMP 08/26/2012 (Exact Date)   SpO2 98%   BMI 40.72 kg/m  BMI: Body mass index is 40.72 kg/m.  Physical Exam  Constitutional: She is oriented to person, place, and time. She appears well-developed and well-nourished.  HENT:  Head: Normocephalic and atraumatic.  Eyes: Right eye exhibits no discharge. Left eye exhibits no discharge.  Neck: Normal range of motion. No JVD present.  Well-healing horizontal midline anterior neck surgical scar  Cardiovascular: Normal rate, regular rhythm, S1  normal, S2 normal and normal heart sounds. Exam reveals no distant heart sounds, no friction rub, no midsystolic click and no opening snap.  No murmur heard. Pulses:      Posterior tibial pulses are 2+ on the right side, and 2+ on the left side.  Pulmonary/Chest: Effort normal and breath sounds normal. No respiratory distress. She has no decreased breath sounds. She has no wheezes. She has no rales. She exhibits no tenderness.  Abdominal: Soft. She exhibits no distension. There is no tenderness.  Musculoskeletal: She exhibits no edema.  Neurological: She is alert and oriented to person, place, and  time.  Skin: Skin is warm and dry. No cyanosis. Nails show no clubbing.  Psychiatric: Her speech is normal and behavior is normal. Judgment and thought content normal. Her mood appears anxious.  Jittery      EKG:  Was ordered and interpreted by me today. Shows NSR, 73 bpm, nonspecific st elevation III, TWI leads I, II, aVL, V2-V6 (anterolateral TWI noted on EKG at [facility] in 10/2016)  Recent Labs: 09/14/2016: ALT 22; BUN 17; Creatinine, Ser 1.14; Hemoglobin 12.9; Platelets 252; Potassium 3.8; Sodium 141; TSH 2.879  08/22/2016: Cholesterol 162; HDL 47; LDL Cholesterol 78; Total CHOL/HDL Ratio 3.4; Triglycerides 187; VLDL 37   CrCl cannot be calculated (Patient's most recent lab result is older than the maximum 21 days allowed.).   Wt Readings from Last 3 Encounters:  01/22/17 248 lb 8 oz (112.7 kg)  11/20/16 249 lb 1.6 oz (113 kg)  10/15/16 243 lb 9.6 oz (110.5 kg)     Orthostatic Vital Signs: Lying: 103/72, 71 bpm Sitting: 99/69, 81 bpm Standing: 83/61, 91 bpm Standing x 3 minutes: 80/58, 78 bpm  Other studies reviewed: Additional studies/records reviewed today include: summarized above  ASSESSMENT AND PLAN:  1. Orthostatic hypotension/dizziness: Stop losartan and Toprol.  Increase hydration.  Check echocardiogram as below.  Consider outpatient cardiac monitoring.  Check CBC and cmet.   Recently started gabapentin approximately 1 month ago.  Advised patient to discuss this with prescribing provider as this may be playing a role in some of her symptoms.  2. Abnormal EKG/nonobstructive CAD: Recent normal Lexiscan Myoview in 06/2016.  Check echo.  EKG changes noted today were also noted on 10/29/2016 in care everywhere.  Suspect these changes are secondary to the patient's hypotension.  We will plan for follow-up EKG as blood pressure improves.  For now, continue aspirin.  Continue simvastatin.  Toprol held as above.  3. Anemia: Baseline hemoglobin appears to be approximately 12-13 with most recent level from 12/21/2016 of 10.0.  Check CBC today if hemoglobin continues to down trend this may be playing a role in some of her dizziness/hypotension.  4. Anxiety: Given her recent illnesses her current level of anxiety certainly seems appropriate.  Recommend she follow-up with PCP for further management and coping mechanisms.  5. Back pain: Followed by [facility].  Reports last usage of Flexeril and narcotic pain medication.  6. Status post breast cancer/thyroid cancer: Followed by oncology.  Discussed with Dr. Fletcher Anon.  Disposition: F/u with myself or Dr. Fletcher Anon in 2 weeks.  Current medicines are reviewed at length with the patient today.  The patient did not have any concerns regarding medicines.  Melvern Banker PA-C 01/22/2017 2:37 PM     [address], [place] [address] [phone]

## 2017-01-22 ENCOUNTER — Encounter: Payer: Self-pay | Admitting: Physician Assistant

## 2017-01-22 ENCOUNTER — Ambulatory Visit (INDEPENDENT_AMBULATORY_CARE_PROVIDER_SITE_OTHER): Payer: Self-pay | Admitting: Physician Assistant

## 2017-01-22 ENCOUNTER — Other Ambulatory Visit
Admission: RE | Admit: 2017-01-22 | Discharge: 2017-01-22 | Disposition: A | Discharge status: Home / Self Care | Payer: Self-pay | Source: Ambulatory Visit | Attending: Physician Assistant | Admitting: Physician Assistant

## 2017-01-22 VITALS — BP 100/72 | HR 72 | Ht 65.5 in | Wt 248.5 lb

## 2017-01-22 DIAGNOSIS — I2510 Atherosclerotic heart disease of native coronary artery without angina pectoris: Secondary | ICD-10-CM

## 2017-01-22 DIAGNOSIS — D649 Anemia, unspecified: Secondary | ICD-10-CM

## 2017-01-22 DIAGNOSIS — R9431 Abnormal electrocardiogram [ECG] [EKG]: Secondary | ICD-10-CM | POA: Insufficient documentation

## 2017-01-22 DIAGNOSIS — R42 Dizziness and giddiness: Secondary | ICD-10-CM | POA: Insufficient documentation

## 2017-01-22 DIAGNOSIS — I951 Orthostatic hypotension: Secondary | ICD-10-CM

## 2017-01-22 DIAGNOSIS — I10 Essential (primary) hypertension: Secondary | ICD-10-CM

## 2017-01-22 DIAGNOSIS — R0602 Shortness of breath: Secondary | ICD-10-CM | POA: Insufficient documentation

## 2017-01-22 DIAGNOSIS — I214 Non-ST elevation (NSTEMI) myocardial infarction: Secondary | ICD-10-CM | POA: Insufficient documentation

## 2017-01-22 LAB — COMPREHENSIVE METABOLIC PANEL
ALBUMIN: 4 g/dL (ref 3.5–5.0)
ALT: 25 U/L (ref 14–54)
AST: 40 U/L (ref 15–41)
Alkaline Phosphatase: 89 U/L (ref 38–126)
Anion gap: 9 (ref 5–15)
BUN: 8 mg/dL (ref 6–20)
CALCIUM: 8.7 mg/dL — AB (ref 8.9–10.3)
CO2: 26 mmol/L (ref 22–32)
CREATININE: 1.23 mg/dL — AB (ref 0.44–1.00)
Chloride: 101 mmol/L (ref 101–111)
GFR calc Af Amer: 56 mL/min — ABNORMAL LOW (ref >60)
GFR calc non Af Amer: 48 mL/min — ABNORMAL LOW (ref >60)
GLUCOSE: 111 mg/dL — AB (ref 65–99)
Potassium: 4 mmol/L (ref 3.5–5.1)
SODIUM: 136 mmol/L (ref 135–145)
Total Bilirubin: 0.3 mg/dL (ref 0.3–1.2)
Total Protein: 6.6 g/dL (ref 6.5–8.1)

## 2017-01-22 LAB — CBC WITH DIFFERENTIAL/PLATELET
BASOS PCT: 1 %
Basophils Absolute: 0.1 10*3/uL (ref 0–0.1)
EOS ABS: 0.3 10*3/uL (ref 0–0.7)
EOS PCT: 4 %
HEMATOCRIT: 36.3 % (ref 35.0–47.0)
Hemoglobin: 12.2 g/dL (ref 12.0–16.0)
Lymphocytes Relative: 30 %
Lymphs Abs: 2 10*3/uL (ref 1.0–3.6)
MCH: 29.8 pg (ref 26.0–34.0)
MCHC: 33.5 g/dL (ref 32.0–36.0)
MCV: 88.9 fL (ref 80.0–100.0)
MONO ABS: 0.6 10*3/uL (ref 0.2–0.9)
MONOS PCT: 9 %
NEUTROS ABS: 3.7 10*3/uL (ref 1.4–6.5)
Neutrophils Relative %: 56 %
PLATELETS: 242 10*3/uL (ref 150–440)
RBC: 4.09 MIL/uL (ref 3.80–5.20)
RDW: 14.3 % (ref 11.5–14.5)
WBC: 6.6 10*3/uL (ref 3.6–11.0)

## 2017-01-22 NOTE — Patient Instructions (Signed)
Medication Instructions:  Your physician has recommended you make the following change in your medication:  1- STOP Losartan. 2- STOP Metoprolol.   Labwork: Your physician recommends that you return for lab work in: TODAY (CBC, CMET).  - Please go to the [facility]. You will check in at the front desk to the right as you walk into the atrium. Valet Parking is offered if needed.    Testing/Procedures: Your physician has requested that you have an echocardiogram. Echocardiography is a painless test that uses sound waves to create images of your heart. It provides your doctor with information about the size and shape of your heart and how well your heart's chambers and valves are working. This procedure takes approximately one hour. There are no restrictions for this procedure.    Follow-Up: Your physician recommends that you schedule a follow-up appointment in: [place], PA-C.   Please be sure to increase your intake of fluids especially water.    If you need a refill on your cardiac medications before your next appointment, please call your pharmacy.   Echocardiogram An echocardiogram, or echocardiography, uses sound waves (ultrasound) to produce an image of your heart. The echocardiogram is simple, painless, obtained within a short period of time, and offers valuable information to your health care provider. The images from an echocardiogram can provide information such as:  Evidence of coronary artery disease (CAD).  Heart size.  Heart muscle function.  Heart valve function.  Aneurysm detection.  Evidence of a past heart attack.  Fluid buildup around the heart.  Heart muscle thickening.  Assess heart valve function.  Tell a health care provider about:  Any allergies you have.  All medicines you are taking, including vitamins, herbs, eye drops, creams, and over-the-counter medicines.  Any problems you or family members have had with  anesthetic medicines.  Any blood disorders you have.  Any surgeries you have had.  Any medical conditions you have.  Whether you are pregnant or may be pregnant. What happens before the procedure? No special preparation is needed. Eat and drink normally. What happens during the procedure?  In order to produce an image of your heart, gel will be applied to your chest and a wand-like tool (transducer) will be moved over your chest. The gel will help transmit the sound waves from the transducer. The sound waves will harmlessly bounce off your heart to allow the heart images to be captured in real-time motion. These images will then be recorded.  You may need an IV to receive a medicine that improves the quality of the pictures. What happens after the procedure? You may return to your normal schedule including diet, activities, and medicines, unless your health care provider tells you otherwise. This information is not intended to replace advice given to you by your health care provider. Make sure you discuss any questions you have with your health care provider. Document Released: 12/30/1999 Document Revised: 08/20/2015 Document Reviewed: 09/08/2012 Elsevier Interactive Patient Education  2017 [employer].

## 2017-01-23 ENCOUNTER — Other Ambulatory Visit: Payer: Self-pay | Admitting: Physician Assistant

## 2017-01-23 ENCOUNTER — Telehealth: Payer: Self-pay | Admitting: Physician Assistant

## 2017-01-23 DIAGNOSIS — R9431 Abnormal electrocardiogram [ECG] [EKG]: Secondary | ICD-10-CM

## 2017-01-23 DIAGNOSIS — I2510 Atherosclerotic heart disease of native coronary artery without angina pectoris: Secondary | ICD-10-CM

## 2017-01-23 DIAGNOSIS — R0600 Dyspnea, unspecified: Secondary | ICD-10-CM

## 2017-01-23 DIAGNOSIS — R42 Dizziness and giddiness: Secondary | ICD-10-CM

## 2017-01-23 NOTE — Progress Notes (Signed)
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  She currently feels terrible after a recent thyroidectomy for thyroid cancer.  She is having radioactive iodine ablation in the near future.  She otherwise feels well.  She has no other neurologic complaints. She denies any recent fevers. She has no chest pain or shortness of breath. She denies any nausea, vomiting, constipation, or diarrhea. She has no urinary complaints. Patient offers no further specific complaints today.  REVIEW OF SYSTEMS:   Review of Systems  Constitutional: Positive for malaise/fatigue. Negative for fever and weight loss.  HENT: Negative for congestion and sore throat.   Eyes: Negative for blurred vision and pain.  Respiratory: Negative.  Negative for cough and shortness of breath.   Cardiovascular: Negative.  Negative for chest pain and leg swelling.  Gastrointestinal: Negative for abdominal pain, diarrhea and nausea.  Genitourinary: Negative.   Musculoskeletal: Positive for back pain and joint pain.  Neurological: Positive for weakness. Negative for sensory change and headaches.  Psychiatric/Behavioral: Negative.  Negative for depression. The patient is not nervous/anxious.     As  per HPI. Otherwise, a complete review of systems is negative.  PAST MEDICAL HISTORY: Past Medical History:  Diagnosis Date  . Anemia    h/o  . Breast cancer ([place]) 07/2014   T1c,N0, ER/ PR negative, Her 2 neu positive. Wide excision, SLN, Mammosite, adjuvant chemotherapy.   . Bronchitis 04-2014  . Coronary artery disease, non-occlusive    a. LHC 4/16 w/ mild nonobstructive disease affecting the LAD with tortuous vessels. CTA in 2017 showed only 30% disease in the LAD. bCarlton Adam MV 6/18: neg for signficant ischemia, EF 54%, low risk scan  . Detached retina 1997  . Family history of adverse reaction to anesthesia    pts dad has pseudocholinestrace deficieny  . Gastritis   . GERD (gastroesophageal reflux disease)   . HTN (hypertension)   . Hypothyroidism   . Insomnia   . Last menstrual period (LMP) > 10 days ago    LMP 2014  . Major depressive disorder, recurrent ([place])   . Migraines   . Morbid obesity ([place])   . OSA (obstructive sleep apnea)    untreated due to lack of insurance  . Overactive bladder   . Pericarditis 1997  . Thyroid nodule 10/24/2016   4.7 cm, October 2018; referred for biopsy    PAST SURGICAL HISTORY: Past Surgical History:  Procedure Laterality Date  . BACK SURGERY     L4-5  . BREAST BIOPSY Left 07/2014   [facility]  . BREAST CYST ASPIRATION Left 12/13/2014   3:00 position 2 cmfn, fat necrosis per Dr. Dwyane Luo notes  . BREAST LUMPECTOMY WITH SENTINEL LYMPH NODE BIOPSY Left 08/13/2014   Procedure: BREAST LUMPECTOMY WITH SENTINEL LYMPH NODE BIOPSY;  Surgeon: Robert Bellow, MD;  Location: [facility] ORS;  Service: General;  Laterality: Left;  . BREAST MAMMOSITE Left 08/30/2014   Procedure: MAMMOSITE BREAST;  Surgeon: Robert Bellow, MD;  Location: [facility] ORS;  Service: General;  Laterality: Left;  . CESAREAN SECTION    . COLONOSCOPY WITH PROPOFOL N/A 04/18/2016   Procedure: COLONOSCOPY WITH PROPOFOL;  Surgeon: Robert Bellow, MD;  Location[facility] END[facility]COPY;   Service: Endoscopy;  Laterality: N/A;  . CORONARY ANGIOPLASTY  April 2016  . EYE SURGERY Left 10/16/2015   retina repair  . FOOT SURGERY    . FORAMINOTOMY 1 LEVEL  [id number]  . KNEE SURGERY    . LAMINECTOMY  [id number]  . PORTACATH PLACEMENT Right 08/30/2014   Procedure: INSERTION PORT-A-CATH;  Surgeon: Robert Bellow, MD;  Location: [facility] ORS;  Service: General;  Laterality: Right;  . POSTERIOR LAMINECTOMY / DECOMPRESSION LUMBAR SPINE  10/29/2016   L3-5  . RETINAL DETACHMENT SURGERY Right 1997  . TOTAL THYROIDECTOMY  12/19/2016    FAMILY HISTORY Family History  Problem Relation Age of Onset  . Alcohol abuse Mother   . Thyroid disease Mother   . Hypertension Mother   . COPD Mother   . Cancer Father        liposarcoma  . Congestive Heart Failure Father        V Tach  . Depression Father   . Hypertension Father   . Heart attack Father   . Arrhythmia Father   . Heart failure Father   . Cancer Maternal Uncle        Pancreatic cancer  . Heart disease Paternal Grandfather 67       MI  . Diabetes Other   . Cancer Sister        breast  . Breast cancer Sister 93       ADVANCED DIRECTIVES:    HEALTH MAINTENANCE: Social History   Tobacco Use  . Smoking status: Never Smoker  . Smokeless tobacco: Never Used  Substance Use Topics  . Alcohol use: No  . Drug use: No    Allergies  Allergen Reactions  . Reglan [Metoclopramide] Shortness Of Breath and Other (See Comments)    This medication caused a dystonic reaction.    Marland Kitchen Paxil [Paroxetine Hcl] Other (See Comments)    Reaction:  Blurred vision  . Other Rash  . Tape Rash  . Tegaderm Ag Mesh [Silver] Rash  . Ultram [Tramadol] Itching and Rash    Current Outpatient Medications  Medication Sig Dispense Refill  . acetaminophen (TYLENOL) 500 MG tablet Take 2 tablets (1,000 mg total) every 6 (six) hours as needed by mouth for mild pain or headache. Maximum dose of acetaminophen 3,000 mg total per day    . aspirin EC 81 MG  tablet Take 81 mg by mouth daily.    Marland Kitchen buPROPion (WELLBUTRIN SR) 150 MG 12 hr tablet Take 150 mg by mouth daily.    . cyclobenzaprine (FLEXERIL) 5 MG tablet Take 5-10 mg by mouth 3 (three) times daily as needed.     . DULoxetine (CYMBALTA) 60 MG capsule Take 1 capsule (60 mg total) by mouth daily. 30 capsule 1  . fluticasone (FLONASE) 50 MCG/ACT nasal spray Place 2 sprays into both nostrils daily. (Patient taking differently: Place 2 sprays as needed into both nostrils. ) 16 g 6  . levothyroxine (SYNTHROID, LEVOTHROID) 100 MCG tablet TAKE 1 TABLET BY MOUTH ONCE DAILY (Patient not taking: Reported on 01/22/2017) 30 tablet 8  . loratadine (CLARITIN) 10 MG tablet Take 10 mg by  mouth daily as needed.     Marland Kitchen omeprazole (PRILOSEC) 20 MG capsule Take 1 capsule (20 mg total) daily by mouth. 30 capsule 3  . simvastatin (ZOCOR) 10 MG tablet Take 1 tablet (10 mg total) by mouth daily. (Patient taking differently: Take 10 mg by mouth at bedtime. ) 90 tablet 3  . traZODone (DESYREL) 50 MG tablet Take 100 mg by mouth at bedtime as needed for sleep.      No current facility-administered medications for this visit.     OBJECTIVE: There were no vitals filed for this visit.   There is no height or weight on file to calculate BMI.    ECOG FS:0 - Asymptomatic  General: Well-developed, well-nourished, no acute distress. Eyes: Pink conjunctiva, anicteric sclera. Breasts: Patient had recent breast exam that was reported as normal. Lungs: Clear to auscultation bilaterally. Heart: Regular rate and rhythm. No rubs, murmurs, or gallops. Abdomen: Soft, nontender, nondistended. No organomegaly noted, normoactive bowel sounds. Musculoskeletal: No edema, cyanosis, or clubbing. Neuro: Alert, answering all questions appropriately. Cranial nerves grossly intact. Skin: No rashes or petechiae noted. Psych: Normal affect.   LAB RESULTS:  Lab Results  Component Value Date   NA 136 01/22/2017   K 4.0 01/22/2017   CL 101  01/22/2017   CO2 26 01/22/2017   GLUCOSE 111 (H) 01/22/2017   BUN 8 01/22/2017   CREATININE 1.23 (H) 01/22/2017   CALCIUM 8.7 (L) 01/22/2017   PROT 6.6 01/22/2017   ALBUMIN 4.0 01/22/2017   AST 40 01/22/2017   ALT 25 01/22/2017   ALKPHOS 89 01/22/2017   BILITOT 0.3 01/22/2017   GFRNONAA 48 (L) 01/22/2017   GFRAA 56 (L) 01/22/2017    Lab Results  Component Value Date   WBC 6.6 01/22/2017   NEUTROABS 3.7 01/22/2017   HGB 12.2 01/22/2017   HCT 36.3 01/22/2017   MCV 88.9 01/22/2017   PLT 242 01/22/2017     STUDIES: No results found.  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  PLAN:    1. 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 Patient's MUGA scan from July 18, 2015 reported an EF of 53%.  Given patient's persistent side effects, chemotherapy was discontinued altogether after 3 cycles. Patient completed her year-long maintenance Herceptin on October 04, 2015. Her most recent mammogram on September 10, 2016 was reported as BI-RADS 2, repeat in August 2019.  Return to clinic in 6 months for routine evaluation. 2. Anemia: Patient's hemoglobin is now within normal limits. 3. Back pain: Continue treatment per PCP.   4. Thyroid cancer: Patient has now had thyroidectomy with lymph node dissection.  She reports that she will have radioactive iodine ablation in the near future.  Patient expressed understanding and was in agreement with this plan. She also understands that She can call clinic at any time with any questions, concerns, or complaints.   Breast cancer, female   Staging form: Breast, AJCC 7th Edition     Pathologic stage from 08/17/2014: Stage IA (T1c, N0, cM0) - Signed by Lloyd Huger, MD on 08/17/2014  Lloyd Huger, MD   01/27/2017 11:12 AM

## 2017-01-23 NOTE — Telephone Encounter (Signed)
Patient returning call re labs drawn yesterday

## 2017-01-23 NOTE — Telephone Encounter (Signed)
Reviewed w/pt. See results note

## 2017-01-24 ENCOUNTER — Ambulatory Visit
Admission: RE | Admit: 2017-01-24 | Discharge: 2017-01-24 | Disposition: A | Discharge status: Home / Self Care | Payer: Self-pay | Source: Ambulatory Visit | Attending: Radiation Oncology | Admitting: Radiation Oncology

## 2017-01-24 ENCOUNTER — Inpatient Hospital Stay (HOSPITAL_BASED_OUTPATIENT_CLINIC_OR_DEPARTMENT_OTHER): Payer: Self-pay | Admitting: Oncology

## 2017-01-24 ENCOUNTER — Other Ambulatory Visit: Payer: Self-pay

## 2017-01-24 ENCOUNTER — Inpatient Hospital Stay: Payer: Self-pay | Attending: Oncology

## 2017-01-24 VITALS — BP 118/72 | Temp 97.6°F | Resp 20 | Wt 256.7 lb

## 2017-01-24 DIAGNOSIS — I10 Essential (primary) hypertension: Secondary | ICD-10-CM

## 2017-01-24 DIAGNOSIS — E669 Obesity, unspecified: Secondary | ICD-10-CM | POA: Insufficient documentation

## 2017-01-24 DIAGNOSIS — R5383 Other fatigue: Secondary | ICD-10-CM | POA: Insufficient documentation

## 2017-01-24 DIAGNOSIS — Z79899 Other long term (current) drug therapy: Secondary | ICD-10-CM

## 2017-01-24 DIAGNOSIS — E890 Postprocedural hypothyroidism: Secondary | ICD-10-CM | POA: Insufficient documentation

## 2017-01-24 DIAGNOSIS — E039 Hypothyroidism, unspecified: Secondary | ICD-10-CM | POA: Insufficient documentation

## 2017-01-24 DIAGNOSIS — C50512 Malignant neoplasm of lower-outer quadrant of left female breast: Secondary | ICD-10-CM

## 2017-01-24 DIAGNOSIS — Z853 Personal history of malignant neoplasm of breast: Secondary | ICD-10-CM | POA: Insufficient documentation

## 2017-01-24 DIAGNOSIS — Z7982 Long term (current) use of aspirin: Secondary | ICD-10-CM | POA: Insufficient documentation

## 2017-01-24 DIAGNOSIS — Z85850 Personal history of malignant neoplasm of thyroid: Secondary | ICD-10-CM | POA: Insufficient documentation

## 2017-01-24 DIAGNOSIS — Z171 Estrogen receptor negative status [ER-]: Secondary | ICD-10-CM

## 2017-01-24 DIAGNOSIS — Z808 Family history of malignant neoplasm of other organs or systems: Secondary | ICD-10-CM

## 2017-01-24 DIAGNOSIS — M545 Low back pain: Secondary | ICD-10-CM | POA: Insufficient documentation

## 2017-01-24 DIAGNOSIS — D649 Anemia, unspecified: Secondary | ICD-10-CM | POA: Insufficient documentation

## 2017-01-24 DIAGNOSIS — K219 Gastro-esophageal reflux disease without esophagitis: Secondary | ICD-10-CM | POA: Insufficient documentation

## 2017-01-24 DIAGNOSIS — N3281 Overactive bladder: Secondary | ICD-10-CM

## 2017-01-24 DIAGNOSIS — Z803 Family history of malignant neoplasm of breast: Secondary | ICD-10-CM | POA: Insufficient documentation

## 2017-01-24 DIAGNOSIS — Z9221 Personal history of antineoplastic chemotherapy: Secondary | ICD-10-CM | POA: Insufficient documentation

## 2017-01-24 DIAGNOSIS — G4700 Insomnia, unspecified: Secondary | ICD-10-CM

## 2017-01-24 DIAGNOSIS — Z170 Estrogen receptor positive status [ER+]: Secondary | ICD-10-CM | POA: Insufficient documentation

## 2017-01-24 DIAGNOSIS — G4730 Sleep apnea, unspecified: Secondary | ICD-10-CM

## 2017-01-24 DIAGNOSIS — Z923 Personal history of irradiation: Secondary | ICD-10-CM | POA: Insufficient documentation

## 2017-01-24 DIAGNOSIS — R531 Weakness: Secondary | ICD-10-CM | POA: Insufficient documentation

## 2017-01-24 DIAGNOSIS — I2510 Atherosclerotic heart disease of native coronary artery without angina pectoris: Secondary | ICD-10-CM

## 2017-01-24 NOTE — Progress Notes (Signed)
Patient here today for follow up, also seen by radiation oncology. Patient recently had surgery to remove thyroid and is currently on low iodine diet. Patient states she does not feel well at all.

## 2017-01-24 NOTE — Progress Notes (Signed)
Radiation Oncology Follow-Up Note  Name: Robin Howard Date of Service: 01/24/2017 MRN: [id number] DOB: Nov 03, 1960 Referring Physician: [name], M.D.  Interval History: Robin Howard returned today for routine follow-up evaluation 2.5 years after completing a postoperative course of accelerated partial breast radiation therapy directed to her left breast for a Stage I ER/PR negative with HER-2/neu overexpression infiltrating ductal carcinoma s/p lumpectomy, postoperative chemotherapy and Herceptin completed in August 2016.  Since completing her radiation therapy Robin Howard has done relatively well with respect to her breast cancer.  She underwent a bilateral mammogram on 09/10/2016 that revealed no evidence of suspicious masses or calcifications within either breast.  However, she did undergo an L3-L5 lumbar laminectomy due to severe radiculopathy.  On 01/11/2017 she underwent a total thyroidectomy.  She reports occasional left breast discomfort but denies shortness of breath, fevers, new musculoskeletal pain, abnormal vaginal bleeding and upper extremity lymphedema.    Her current medications include Wellbutrin, Flexeril, Cymbalta, Flonase, Synthroid, Claritin, Prilosec, Zocor and Desyrel.    On physical examination today [name] appears surprisingly well although clearly tired.  There is no palpable supraclavicular, infraclavicular, jugular or axillary lymphadenopathy.  There is a healing midline surgical scar from the recent thyroidectomy.  The scar is healing well without evidence of erythema, warmth to palpation or tenderness.  Lungs are clear.  Heart is regular in rate.  Abdomen is proutberant but soft and non-tender.  The back and bony pelvis are non-tender to vigorous fist percsssion.  There is no evidence of upper extremity lymphedema and full range of motion is present.  Visual inspection and palpation of the left breast reveals no evidence of skin changes, erythema , central edema or  volume loss.  The cosmetic results are excellent.  Palpation reveals no evidence of suspicious masses or lesions.  Visual inspection and palpation of the right breast is negative for any masses. There are no focal neurologic deficits.     I am quite pleased that Robin Howard remains clinically and radiographically NED more than 2 years after completing treatment for a Stage I infiltrating ductal carcinoma.  She is scheduled to be seen this afternoon by Dr. Grayland Ormond.  I have asked her to return for routine follow-up in the radiation oncology clinic in one year or sooner should problems arise.  As always, thank you very much for allowing me to participate in the care of this most pleasant woman.  Douglas A. Claudie Leach, M.D., M.P.H.

## 2017-01-25 LAB — CANCER ANTIGEN 27.29: CAN 27.29: 20 U/mL (ref 0.0–38.6)

## 2017-02-02 NOTE — H&P (View-Only) (Signed)
Cardiology Office Note Date:  02/07/2017  Patient ID:  Robin Howard, Robin Howard 12/07/1960, MRN [id number] PCP:  Arnetha Courser, MD  Cardiologist:  Dr. Fletcher Anon, MD    Chief Complaint: Follow up  History of Present Illness: Robin Howard is a 57 y.o. female with history of mild nonobstructive CAD by cardiac cath in 04/2014 with normal EF, breast cancer in 07/2015 s/p lumpectomy followed by chemotherapy, completing Herceptin in the fall of 2017. 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   MUGA scan from 07/2015 showed stable EF of 56%. She was seen by Dr. Fletcher Anon in 10/2015 for routine follow up and was doing well at that time. She underwent colonoscopy in 04/2016 that was normal.     Previously admitted to the hospital and 04/2014 with chest pain found to have mild troponin elevation with peak of 0.50. Cardiac catheterization done at that time by outside group apparently showed nonobstructive CAD (report not available for review at this time). She again presented to the hospital with recurrence of chest pain in 01/2015 and was found to have elevated troponin with peak of 1.02 and started on heparin drip. She was consulted on by her cardiologist at that time who recommended discontinuation of heparin drip, patient discharge, and outpatient nuclear stress testing. Per patient report she followed up with the stress test though we do not have results for review at this time. She also stated she had what sounds to be a coronary artery CT with a self-reported "30% blockage." She was seen by myself in 06/2016, and was doing well at that time. She did note occasional intermittent sharp substernal chest pain that was not associated with exertion or rest. She underwent  Lexiscan Myoview on 06/27/16 that showed no significant ischemia, normal wall motion, EF 54%. Low risk scan. She underwent successful lumbar laminectomy in 10/2016. Since that time, she has had recurrence of lumbar and hip pain with increase in pain medication. She has reported 2 syncopal episodes since her surgery in October, 2018. Blood pressure has been labile, ranging from the 32I to 712W systolic. Her syncopal episodes were felt to be orthostatic in etiology in the setting of increased pain medications. Her beta blocker was decreased by PCP. In follow up on 01/22/2017, she was somewhat overwhelmed and anxious. She continued to feel intermittently dizzy associated with positional changes as well as with ambulation. Per her report, blood pressure readings at home had been in the 580D systolic. She was taking losartan 25 mg daily and Toprol 25 mg daily (each typically midday), and had not taken her medications yet on 1/8 when she was seen. She was not taking Flexeril or narcotic pain medication. She reported adequate hydration. She also noted intermittent sharp chest pain that typically self resolved. BP was noted to be soft at 100/72 with orthostatic vitals signs being positive. Her losartan and Toprol were stopped at that time and she was advised to increase her hydration. She was advised to discuss her recent new medication, gabapentin, with her PCP. CBC was unremarkable. CMETshowed a SCr of 1.23 (basline 0.9-1.1). Echo performed on 02/05/17 showed an EF of 55-60%, mild LVH, not technically sufficient to allow for LV diastolic function, RV cavity size normal with normal systolic function.  She comes in today noting continued exertional  shortness of breath.  Reports significant shortness of breath with walking down the hallway of her house.  Has had one episode of right-sided chest pain since she was last seen that was not associated with exertion.  Has not noticed any change in her symptoms since she was last  seen.  Not checking her blood pressure at home as she reports "I do not want to worry about it."  Still with occasional positional dizziness.  No further presyncope or syncope episodes.  Weight is up 11 pounds today low she feels like this is secondary to being off of her levothyroxine which she restarted on 1/23.  No orthopnea, cough, lower joint swelling, PND, or early satiety.  Continues to be overwhelmed with her recent diagnoses and prefers to "not think about it."  Currently chest pain-free.   Past Medical History:  Diagnosis Date  . Anemia    h/o  . Breast cancer ([place]) 07/2014   T1c,N0, ER/ PR negative, Her 2 neu positive. Wide excision, SLN, Mammosite, adjuvant chemotherapy.   . Bronchitis 04-2014  . Coronary artery disease, non-occlusive    a. LHC 4/16 w/ mild nonobstructive disease affecting the LAD with tortuous vessels. CTA in 2017 showed only 30% disease in the LAD. bCarlton Adam MV 6/18: neg for signficant ischemia, EF 54%, low risk scan  . Detached retina 1997  . Family history of adverse reaction to anesthesia    pts dad has pseudocholinestrace deficieny  . Gastritis   . GERD (gastroesophageal reflux disease)   . History of nuclear stress test    a. MV 6/18: no significant ischemia, normal wall motion, EF 54%. Low risk scan  . HTN (hypertension)   . Hx of papillary thyroid carcinoma 02/05/2017   Dec 2018; T3aN0Mx  s/p total thyroidectomy Dec 19, 2016, s/p I-131 RAI 01/02/17  . Hypothyroidism   . Insomnia   . Last menstrual period (LMP) > 10 days ago    LMP 2014  . Major depressive disorder, recurrent ([place])   . Migraines   . Morbid obesity ([place])   . Orthostatic hypotension    a. TTE 1/19:  EF 55-60%, normal wall motion, grade 2 diastolic dysfunction, mild aortic regurgitation, mildly calcified mitral annulus, trivial pericardial effusion was identified  . OSA (obstructive sleep apnea)    untreated due to lack of insurance  . Overactive bladder   . Pericarditis 1997  .  Thyroid nodule 10/24/2016   4.7 cm, October 2018; referred for biopsy    Past Surgical History:  Procedure Laterality Date  . BACK SURGERY     L4-5  . BREAST BIOPSY Left 07/2014   [facility]  . BREAST CYST ASPIRATION Left 12/13/2014   3:00 position 2 cmfn, fat necrosis per Dr. Dwyane Luo notes  . BREAST LUMPECTOMY WITH SENTINEL LYMPH NODE BIOPSY Left 08/13/2014   Procedure: BREAST LUMPECTOMY WITH SENTINEL LYMPH NODE BIOPSY;  Surgeon: Robert Bellow, MD;  Location: [facility] ORS;  Service: General;  Laterality: Left;  . BREAST MAMMOSITE Left 08/30/2014   Procedure: MAMMOSITE BREAST;  Surgeon: Robert Bellow, MD;  Location: [facility] ORS;  Service: General;  Laterality: Left;  . CESAREAN SECTION    . COLONOSCOPY WITH PROPOFOL N/A 04/18/2016   Procedure: COLONOSCOPY WITH PROPOFOL;  Surgeon: Robert Bellow, MD;  Location: [facility] ENDOSCOPY;  Service: Endoscopy;  Laterality: N/A;  . CORONARY ANGIOPLASTY  April 2016  . EYE SURGERY Left 10/16/2015   retina repair  . FOOT SURGERY    . FORAMINOTOMY 1 LEVEL  [id number]  . KNEE SURGERY    . LAMINECTOMY  [id number]  . PORTACATH PLACEMENT Right 08/30/2014   Procedure: INSERTION PORT-A-CATH;  Surgeon: Robert Bellow, MD;  Location: [facility] ORS;  Service: General;  Laterality: Right;  . POSTERIOR LAMINECTOMY / DECOMPRESSION LUMBAR SPINE  10/29/2016   L3-5  . RETINAL DETACHMENT SURGERY Right 1997  . TOTAL THYROIDECTOMY  12/19/2016  . TOTAL THYROIDECTOMY Bilateral 12/19/2016   [facility]    Current Meds  Medication Sig  . acetaminophen (TYLENOL) 500 MG tablet Take 2 tablets (1,000 mg total) every 6 (six) hours as needed by mouth for mild pain or headache. Maximum dose of acetaminophen 3,000 mg total per day  . aspirin EC 81 MG tablet Take 81 mg by mouth daily.  Marland Kitchen buPROPion (WELLBUTRIN SR) 150 MG 12 hr tablet Take 150 mg by mouth daily.  . cyclobenzaprine (FLEXERIL) 5 MG tablet Take 5-10 mg by mouth 3 (three) times daily as needed.   . DULoxetine (CYMBALTA) 60 MG  capsule Take 1 capsule (60 mg total) by mouth daily.  . fluticasone (FLONASE) 50 MCG/ACT nasal spray Place 2 sprays into both nostrils daily. (Patient taking differently: Place 2 sprays as needed into both nostrils. )  . levothyroxine (SYNTHROID, LEVOTHROID) 100 MCG tablet Take 150 mcg by mouth daily before breakfast.  . loratadine (CLARITIN) 10 MG tablet Take 10 mg by mouth daily as needed.   Marland Kitchen omeprazole (PRILOSEC) 20 MG capsule Take 1 capsule (20 mg total) daily by mouth.  . simvastatin (ZOCOR) 10 MG tablet Take 1 tablet (10 mg total) by mouth daily. (Patient taking differently: Take 10 mg by mouth at bedtime. )  . traZODone (DESYREL) 50 MG tablet Take 100 mg by mouth at bedtime as needed for sleep.     Allergies:   Reglan [metoclopramide]; Paxil [paroxetine hcl]; Other; Tape; Tegaderm ag mesh [silver]; and Ultram [tramadol]   Social History:  The patient  reports that  has never smoked. she has never used smokeless tobacco. She reports that she does not drink alcohol or use drugs.   Family History:  The patient's family history includes Alcohol abuse in her mother; Arrhythmia in her father; Breast cancer (age of onset: 53) in her sister; COPD in her mother; Cancer in her father, maternal uncle, and sister; Congestive Heart Failure in her father; Depression in her father; Diabetes in her other; Heart attack in her father; Heart disease (age of onset: 80) in her paternal grandfather; Heart failure in her father; Hypertension in her father and mother; Thyroid disease in her mother.  ROS:   Review of Systems  Constitutional: Positive for malaise/fatigue. Negative for chills, diaphoresis, fever and weight loss.  HENT: Negative for congestion.   Eyes: Negative for discharge and redness.  Respiratory: Positive for shortness of breath. Negative for cough, hemoptysis, sputum production and wheezing.   Cardiovascular: Negative for chest pain, palpitations, orthopnea, claudication, leg swelling and  PND.  Gastrointestinal: Positive for heartburn. Negative for abdominal pain, blood in stool, melena, nausea and vomiting.  Genitourinary: Negative for hematuria.  Musculoskeletal: Positive for joint pain and myalgias. Negative for falls.  Skin: Negative for rash.  Neurological: Positive for tingling, sensory change and weakness. Negative for dizziness, tremors, speech change, focal weakness and loss of consciousness.       Right leg  Endo/Heme/Allergies: Does not bruise/bleed easily.  Psychiatric/Behavioral: Negative for substance abuse. The patient is nervous/anxious.   All other systems reviewed and are negative.    PHYSICAL EXAM:  VS:  BP (!) 128/94 (BP Location: Left Arm, Patient Position: Sitting, Cuff Size: Large)   Pulse (!) 109   Ht 5' 5.5" (1.664 m)   Wt 259 lb (117.5 kg)   LMP 08/26/2012 (Exact Date)   BMI 42.44 kg/m  BMI: Body mass index is 42.44 kg/m.  Oxygen saturation 98% on room air.  Physical Exam  Constitutional: She is oriented to person, place, and time. She appears well-developed and well-nourished.  HENT:  Head: Normocephalic and atraumatic.  Eyes: Right eye exhibits no discharge. Left eye exhibits no discharge.  Neck: Normal range of motion. No JVD present.  Well-healing horizontal anterior neck surgical scar.  Cardiovascular: Regular rhythm, S1 normal, S2 normal and normal heart sounds. Tachycardia present. Exam reveals no distant heart sounds, no friction rub, no midsystolic click and no opening snap.  No murmur heard. Pulses:      Posterior tibial pulses are 2+ on the right side, and 2+ on the left side.  Pulmonary/Chest: Effort normal and breath sounds normal. No respiratory distress. She has no decreased breath sounds. She has no wheezes. She has no rales. She exhibits no tenderness.  Abdominal: Soft. She exhibits no distension. There is no tenderness.  Musculoskeletal: She exhibits no edema.  Neurological: She is alert and oriented to person, place, and  time.  Skin: Skin is warm and dry. No cyanosis. Nails show no clubbing.  Psychiatric: She has a normal mood and affect. Her speech is normal and behavior is normal. Judgment and thought content normal.     EKG:  Was ordered and interpreted by me today. Shows sinus tachycardia, 109 bpm, TWI leads I, II, aVF, aVL, V3-V6  Recent Labs: 09/14/2016: TSH 2.879 01/22/2017: ALT 25; BUN 8; Creatinine, Ser 1.23; Hemoglobin 12.2; Platelets 242; Potassium 4.0; Sodium 136  08/22/2016: Cholesterol 162; HDL 47; LDL Cholesterol 78; Total CHOL/HDL Ratio 3.4; Triglycerides 187; VLDL 37   Estimated Creatinine Clearance: 66.8 mL/min (A) (by C-G formula based on SCr of 1.23 mg/dL (H)).   Wt Readings from Last 3 Encounters:  02/07/17 259 lb (117.5 kg)  01/24/17 256 lb 11.6 oz (116.4 kg)  01/22/17 248 lb 8 oz (112.7 kg)      Other studies reviewed: Additional studies/records reviewed today include: summarized above  ASSESSMENT AND PLAN:  1. Exertional dyspnea/abnormal EKG: Check stat d-dimer in the medical mall today given her persistent shortness of breath and sinus tachycardia in the setting of recent breast and thyroid cancer.  No recent prolonged sedentary periods.  If d-dimer is found to be elevated patient was advised she would need to proceed to the ED for further evaluation and possible treatment.  If d-dimer is negative, will plan to schedule patient for right and left cardiac catheterization as her dyspnea may be her anginal equivalent in the setting of abnormal EKG.  Continue aspirin and simvastatin.  2. CAD: As above.  3. Orthostatic dizziness: Continues to note positional dizziness.  Blood pressure in the office today is significantly improved from her last visit.  Given her continued positional dizziness at times as well as her recent significant hypotension on losartan and metoprolol, we will refrain from resuming either of these at this time.  Continue to monitor.  4. Anxiety: Continues to deal  with a fair amount of anxiety given her recent illnesses.  Seems to be appropriate at this time.  Follow-up with PCP.  5. Anemia: Recent CBC showed normal hemoglobin.  Given her shortness of breath is unchanged from that visit I deferred  CBC at this time.  Disposition: F/u with me in 2-4 weeks.  Current medicines are reviewed at length with the patient today.  The patient did not have any concerns regarding medicines.  Signed, Christell Faith, PA-C 02/07/2017 9:20 AM     [facility] [place] [address] [place], [place] [address] [phone]

## 2017-02-02 NOTE — Progress Notes (Signed)
Cardiology Office Note Date:  02/07/2017  Patient ID:  Robin Howard, Robin Howard 06/13/1960, MRN [id number] PCP:  Arnetha Courser, MD  Cardiologist:  Dr. Fletcher Anon, MD    Chief Complaint: Follow up  History of Present Illness: Robin Howard is a 57 y.o. female with history of mild nonobstructive CAD by cardiac cath in 04/2014 with normal EF, breast cancer in 07/2015 s/p lumpectomy followed by chemotherapy, completing Herceptin in the fall of 2017. 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   MUGA scan from 07/2015 showed stable EF of 56%. She was seen by Dr. Fletcher Anon in 10/2015 for routine follow up and was doing well at that time. She underwent colonoscopy in 04/2016 that was normal.     Previously admitted to the hospital and 04/2014 with chest pain found to have mild troponin elevation with peak of 0.50. Cardiac catheterization done at that time by outside group apparently showed nonobstructive CAD (report not available for review at this time). She again presented to the hospital with recurrence of chest pain in 01/2015 and was found to have elevated troponin with peak of 1.02 and started on heparin drip. She was consulted on by her cardiologist at that time who recommended discontinuation of heparin drip, patient discharge, and outpatient nuclear stress testing. Per patient report she followed up with the stress test though we do not have results for review at this time. She also stated she had what sounds to be a coronary artery CT with a self-reported "30% blockage." She was seen by myself in 06/2016, and was doing well at that time. She did note occasional intermittent sharp substernal chest pain that was not associated with exertion or rest. She underwent  Lexiscan Myoview on 06/27/16 that showed no significant ischemia, normal wall motion, EF 54%. Low risk scan. She underwent successful lumbar laminectomy in 10/2016. Since that time, she has had recurrence of lumbar and hip pain with increase in pain medication. She has reported 2 syncopal episodes since her surgery in October, 2018. Blood pressure has been labile, ranging from the 00Q to 676P systolic. Her syncopal episodes were felt to be orthostatic in etiology in the setting of increased pain medications. Her beta blocker was decreased by PCP. In follow up on 01/22/2017, she was somewhat overwhelmed and anxious. She continued to feel intermittently dizzy associated with positional changes as well as with ambulation. Per her report, blood pressure readings at home had been in the 950D systolic. She was taking losartan 25 mg daily and Toprol 25 mg daily (each typically midday), and had not taken her medications yet on 1/8 when she was seen. She was not taking Flexeril or narcotic pain medication. She reported adequate hydration. She also noted intermittent sharp chest pain that typically self resolved. BP was noted to be soft at 100/72 with orthostatic vitals signs being positive. Her losartan and Toprol were stopped at that time and she was advised to increase her hydration. She was advised to discuss her recent new medication, gabapentin, with her PCP. CBC was unremarkable. CMETshowed a SCr of 1.23 (basline 0.9-1.1). Echo performed on 02/05/17 showed an EF of 55-60%, mild LVH, not technically sufficient to allow for LV diastolic function, RV cavity size normal with normal systolic function.  She comes in today noting continued exertional  shortness of breath.  Reports significant shortness of breath with walking down the hallway of her house.  Has had one episode of right-sided chest pain since she was last seen that was not associated with exertion.  Has not noticed any change in her symptoms since she was last  seen.  Not checking her blood pressure at home as she reports "I do not want to worry about it."  Still with occasional positional dizziness.  No further presyncope or syncope episodes.  Weight is up 11 pounds today low she feels like this is secondary to being off of her levothyroxine which she restarted on 1/23.  No orthopnea, cough, lower joint swelling, PND, or early satiety.  Continues to be overwhelmed with her recent diagnoses and prefers to "not think about it."  Currently chest pain-free.   Past Medical History:  Diagnosis Date  . Anemia    h/o  . Breast cancer ([place]) 07/2014   T1c,N0, ER/ PR negative, Her 2 neu positive. Wide excision, SLN, Mammosite, adjuvant chemotherapy.   . Bronchitis 04-2014  . Coronary artery disease, non-occlusive    a. LHC 4/16 w/ mild nonobstructive disease affecting the LAD with tortuous vessels. CTA in 2017 showed only 30% disease in the LAD. bCarlton Adam MV 6/18: neg for signficant ischemia, EF 54%, low risk scan  . Detached retina 1997  . Family history of adverse reaction to anesthesia    pts dad has pseudocholinestrace deficieny  . Gastritis   . GERD (gastroesophageal reflux disease)   . History of nuclear stress test    a. MV 6/18: no significant ischemia, normal wall motion, EF 54%. Low risk scan  . HTN (hypertension)   . Hx of papillary thyroid carcinoma 02/05/2017   Dec 2018; T3aN0Mx  s/p total thyroidectomy Dec 19, 2016, s/p I-131 RAI 01/02/17  . Hypothyroidism   . Insomnia   . Last menstrual period (LMP) > 10 days ago    LMP 2014  . Major depressive disorder, recurrent ([place])   . Migraines   . Morbid obesity ([place])   . Orthostatic hypotension    a. TTE 1/19:  EF 55-60%, normal wall motion, grade 2 diastolic dysfunction, mild aortic regurgitation, mildly calcified mitral annulus, trivial pericardial effusion was identified  . OSA (obstructive sleep apnea)    untreated due to lack of insurance  . Overactive bladder   . Pericarditis 1997  .  Thyroid nodule 10/24/2016   4.7 cm, October 2018; referred for biopsy    Past Surgical History:  Procedure Laterality Date  . BACK SURGERY     L4-5  . BREAST BIOPSY Left 07/2014   [facility]  . BREAST CYST ASPIRATION Left 12/13/2014   3:00 position 2 cmfn, fat necrosis per Dr. Dwyane Luo notes  . BREAST LUMPECTOMY WITH SENTINEL LYMPH NODE BIOPSY Left 08/13/2014   Procedure: BREAST LUMPECTOMY WITH SENTINEL LYMPH NODE BIOPSY;  Surgeon: Robert Bellow, MD;  Location: [facility] ORS;  Service: General;  Laterality: Left;  . BREAST MAMMOSITE Left 08/30/2014   Procedure: MAMMOSITE BREAST;  Surgeon: Robert Bellow, MD;  Location: [facility] ORS;  Service: General;  Laterality: Left;  . CESAREAN SECTION    . COLONOSCOPY WITH PROPOFOL N/A 04/18/2016   Procedure: COLONOSCOPY WITH PROPOFOL;  Surgeon: Robert Bellow, MD;  Location: [facility] ENDOSCOPY;  Service: Endoscopy;  Laterality: N/A;  . CORONARY ANGIOPLASTY  April 2016  . EYE SURGERY Left 10/16/2015   retina repair  . FOOT SURGERY    . FORAMINOTOMY 1 LEVEL  [id number]  . KNEE SURGERY    . LAMINECTOMY  [id number]  . PORTACATH PLACEMENT Right 08/30/2014   Procedure: INSERTION PORT-A-CATH;  Surgeon: Robert Bellow, MD;  Location: [facility] ORS;  Service: General;  Laterality: Right;  . POSTERIOR LAMINECTOMY / DECOMPRESSION LUMBAR SPINE  10/29/2016   L3-5  . RETINAL DETACHMENT SURGERY Right 1997  . TOTAL THYROIDECTOMY  12/19/2016  . TOTAL THYROIDECTOMY Bilateral 12/19/2016   [facility]    Current Meds  Medication Sig  . acetaminophen (TYLENOL) 500 MG tablet Take 2 tablets (1,000 mg total) every 6 (six) hours as needed by mouth for mild pain or headache. Maximum dose of acetaminophen 3,000 mg total per day  . aspirin EC 81 MG tablet Take 81 mg by mouth daily.  Marland Kitchen buPROPion (WELLBUTRIN SR) 150 MG 12 hr tablet Take 150 mg by mouth daily.  . cyclobenzaprine (FLEXERIL) 5 MG tablet Take 5-10 mg by mouth 3 (three) times daily as needed.   . DULoxetine (CYMBALTA) 60 MG  capsule Take 1 capsule (60 mg total) by mouth daily.  . fluticasone (FLONASE) 50 MCG/ACT nasal spray Place 2 sprays into both nostrils daily. (Patient taking differently: Place 2 sprays as needed into both nostrils. )  . levothyroxine (SYNTHROID, LEVOTHROID) 100 MCG tablet Take 150 mcg by mouth daily before breakfast.  . loratadine (CLARITIN) 10 MG tablet Take 10 mg by mouth daily as needed.   Marland Kitchen omeprazole (PRILOSEC) 20 MG capsule Take 1 capsule (20 mg total) daily by mouth.  . simvastatin (ZOCOR) 10 MG tablet Take 1 tablet (10 mg total) by mouth daily. (Patient taking differently: Take 10 mg by mouth at bedtime. )  . traZODone (DESYREL) 50 MG tablet Take 100 mg by mouth at bedtime as needed for sleep.     Allergies:   Reglan [metoclopramide]; Paxil [paroxetine hcl]; Other; Tape; Tegaderm ag mesh [silver]; and Ultram [tramadol]   Social History:  The patient  reports that  has never smoked. she has never used smokeless tobacco. She reports that she does not drink alcohol or use drugs.   Family History:  The patient's family history includes Alcohol abuse in her mother; Arrhythmia in her father; Breast cancer (age of onset: 87) in her sister; COPD in her mother; Cancer in her father, maternal uncle, and sister; Congestive Heart Failure in her father; Depression in her father; Diabetes in her other; Heart attack in her father; Heart disease (age of onset: 23) in her paternal grandfather; Heart failure in her father; Hypertension in her father and mother; Thyroid disease in her mother.  ROS:   Review of Systems  Constitutional: Positive for malaise/fatigue. Negative for chills, diaphoresis, fever and weight loss.  HENT: Negative for congestion.   Eyes: Negative for discharge and redness.  Respiratory: Positive for shortness of breath. Negative for cough, hemoptysis, sputum production and wheezing.   Cardiovascular: Negative for chest pain, palpitations, orthopnea, claudication, leg swelling and  PND.  Gastrointestinal: Positive for heartburn. Negative for abdominal pain, blood in stool, melena, nausea and vomiting.  Genitourinary: Negative for hematuria.  Musculoskeletal: Positive for joint pain and myalgias. Negative for falls.  Skin: Negative for rash.  Neurological: Positive for tingling, sensory change and weakness. Negative for dizziness, tremors, speech change, focal weakness and loss of consciousness.       Right leg  Endo/Heme/Allergies: Does not bruise/bleed easily.  Psychiatric/Behavioral: Negative for substance abuse. The patient is nervous/anxious.   All other systems reviewed and are negative.    PHYSICAL EXAM:  VS:  BP (!) 128/94 (BP Location: Left Arm, Patient Position: Sitting, Cuff Size: Large)   Pulse (!) 109   Ht 5' 5.5" (1.664 m)   Wt 259 lb (117.5 kg)   LMP 08/26/2012 (Exact Date)   BMI 42.44 kg/m  BMI: Body mass index is 42.44 kg/m.  Oxygen saturation 98% on room air.  Physical Exam  Constitutional: She is oriented to person, place, and time. She appears well-developed and well-nourished.  HENT:  Head: Normocephalic and atraumatic.  Eyes: Right eye exhibits no discharge. Left eye exhibits no discharge.  Neck: Normal range of motion. No JVD present.  Well-healing horizontal anterior neck surgical scar.  Cardiovascular: Regular rhythm, S1 normal, S2 normal and normal heart sounds. Tachycardia present. Exam reveals no distant heart sounds, no friction rub, no midsystolic click and no opening snap.  No murmur heard. Pulses:      Posterior tibial pulses are 2+ on the right side, and 2+ on the left side.  Pulmonary/Chest: Effort normal and breath sounds normal. No respiratory distress. She has no decreased breath sounds. She has no wheezes. She has no rales. She exhibits no tenderness.  Abdominal: Soft. She exhibits no distension. There is no tenderness.  Musculoskeletal: She exhibits no edema.  Neurological: She is alert and oriented to person, place, and  time.  Skin: Skin is warm and dry. No cyanosis. Nails show no clubbing.  Psychiatric: She has a normal mood and affect. Her speech is normal and behavior is normal. Judgment and thought content normal.     EKG:  Was ordered and interpreted by me today. Shows sinus tachycardia, 109 bpm, TWI leads I, II, aVF, aVL, V3-V6  Recent Labs: 09/14/2016: TSH 2.879 01/22/2017: ALT 25; BUN 8; Creatinine, Ser 1.23; Hemoglobin 12.2; Platelets 242; Potassium 4.0; Sodium 136  08/22/2016: Cholesterol 162; HDL 47; LDL Cholesterol 78; Total CHOL/HDL Ratio 3.4; Triglycerides 187; VLDL 37   Estimated Creatinine Clearance: 66.8 mL/min (A) (by C-G formula based on SCr of 1.23 mg/dL (H)).   Wt Readings from Last 3 Encounters:  02/07/17 259 lb (117.5 kg)  01/24/17 256 lb 11.6 oz (116.4 kg)  01/22/17 248 lb 8 oz (112.7 kg)      Other studies reviewed: Additional studies/records reviewed today include: summarized above  ASSESSMENT AND PLAN:  1. Exertional dyspnea/abnormal EKG: Check stat d-dimer in the medical mall today given her persistent shortness of breath and sinus tachycardia in the setting of recent breast and thyroid cancer.  No recent prolonged sedentary periods.  If d-dimer is found to be elevated patient was advised she would need to proceed to the ED for further evaluation and possible treatment.  If d-dimer is negative, will plan to schedule patient for right and left cardiac catheterization as her dyspnea may be her anginal equivalent in the setting of abnormal EKG.  Continue aspirin and simvastatin.  2. CAD: As above.  3. Orthostatic dizziness: Continues to note positional dizziness.  Blood pressure in the office today is significantly improved from her last visit.  Given her continued positional dizziness at times as well as her recent significant hypotension on losartan and metoprolol, we will refrain from resuming either of these at this time.  Continue to monitor.  4. Anxiety: Continues to deal  with a fair amount of anxiety given her recent illnesses.  Seems to be appropriate at this time.  Follow-up with PCP.  5. Anemia: Recent CBC showed normal hemoglobin.  Given her shortness of breath is unchanged from that visit I deferred  CBC at this time.  Disposition: F/u with me in 2-4 weeks.  Current medicines are reviewed at length with the patient today.  The patient did not have any concerns regarding medicines.  Signed, Christell Faith, PA-C 02/07/2017 9:20 AM     [facility] [place],  [address] [phone]

## 2017-02-05 ENCOUNTER — Other Ambulatory Visit: Payer: Self-pay

## 2017-02-05 ENCOUNTER — Encounter: Payer: Self-pay | Admitting: Family Medicine

## 2017-02-05 ENCOUNTER — Ambulatory Visit (INDEPENDENT_AMBULATORY_CARE_PROVIDER_SITE_OTHER): Payer: Self-pay

## 2017-02-05 DIAGNOSIS — R42 Dizziness and giddiness: Secondary | ICD-10-CM

## 2017-02-05 DIAGNOSIS — Z85850 Personal history of malignant neoplasm of thyroid: Secondary | ICD-10-CM

## 2017-02-05 DIAGNOSIS — I2510 Atherosclerotic heart disease of native coronary artery without angina pectoris: Secondary | ICD-10-CM

## 2017-02-05 DIAGNOSIS — R0600 Dyspnea, unspecified: Secondary | ICD-10-CM

## 2017-02-05 DIAGNOSIS — R9431 Abnormal electrocardiogram [ECG] [EKG]: Secondary | ICD-10-CM

## 2017-02-05 HISTORY — DX: Personal history of malignant neoplasm of thyroid: Z85.850

## 2017-02-06 ENCOUNTER — Encounter: Payer: Self-pay | Admitting: Physician Assistant

## 2017-02-07 ENCOUNTER — Telehealth: Payer: Self-pay | Admitting: Cardiovascular Disease

## 2017-02-07 ENCOUNTER — Encounter: Payer: Self-pay | Admitting: Physician Assistant

## 2017-02-07 ENCOUNTER — Emergency Department: Payer: Self-pay

## 2017-02-07 ENCOUNTER — Other Ambulatory Visit
Admission: RE | Admit: 2017-02-07 | Discharge: 2017-02-07 | Disposition: A | Discharge status: Home / Self Care | Payer: Self-pay | Source: Ambulatory Visit | Attending: Physician Assistant | Admitting: Physician Assistant

## 2017-02-07 ENCOUNTER — Encounter: Payer: Self-pay | Admitting: Emergency Medicine

## 2017-02-07 ENCOUNTER — Emergency Department
Admission: EM | Admit: 2017-02-07 | Discharge: 2017-02-07 | Disposition: A | Discharge status: Home / Self Care | Payer: Self-pay | Attending: Emergency Medicine | Admitting: Emergency Medicine

## 2017-02-07 ENCOUNTER — Ambulatory Visit (INDEPENDENT_AMBULATORY_CARE_PROVIDER_SITE_OTHER): Payer: Self-pay | Admitting: Physician Assistant

## 2017-02-07 VITALS — BP 128/94 | HR 109 | Ht 65.5 in | Wt 259.0 lb

## 2017-02-07 DIAGNOSIS — R0609 Other forms of dyspnea: Secondary | ICD-10-CM

## 2017-02-07 DIAGNOSIS — D649 Anemia, unspecified: Secondary | ICD-10-CM

## 2017-02-07 DIAGNOSIS — F419 Anxiety disorder, unspecified: Secondary | ICD-10-CM | POA: Insufficient documentation

## 2017-02-07 DIAGNOSIS — I951 Orthostatic hypotension: Secondary | ICD-10-CM

## 2017-02-07 DIAGNOSIS — R0602 Shortness of breath: Secondary | ICD-10-CM

## 2017-02-07 DIAGNOSIS — I10 Essential (primary) hypertension: Secondary | ICD-10-CM | POA: Insufficient documentation

## 2017-02-07 DIAGNOSIS — I25119 Atherosclerotic heart disease of native coronary artery with unspecified angina pectoris: Secondary | ICD-10-CM

## 2017-02-07 DIAGNOSIS — I2510 Atherosclerotic heart disease of native coronary artery without angina pectoris: Secondary | ICD-10-CM | POA: Insufficient documentation

## 2017-02-07 DIAGNOSIS — R42 Dizziness and giddiness: Secondary | ICD-10-CM | POA: Insufficient documentation

## 2017-02-07 DIAGNOSIS — R0600 Dyspnea, unspecified: Secondary | ICD-10-CM | POA: Insufficient documentation

## 2017-02-07 DIAGNOSIS — R9431 Abnormal electrocardiogram [ECG] [EKG]: Secondary | ICD-10-CM | POA: Insufficient documentation

## 2017-02-07 DIAGNOSIS — E039 Hypothyroidism, unspecified: Secondary | ICD-10-CM | POA: Insufficient documentation

## 2017-02-07 DIAGNOSIS — Z853 Personal history of malignant neoplasm of breast: Secondary | ICD-10-CM | POA: Insufficient documentation

## 2017-02-07 LAB — CBC
HCT: 35.1 % (ref 35.0–47.0)
Hemoglobin: 11.5 g/dL — ABNORMAL LOW (ref 12.0–16.0)
MCH: 29.4 pg (ref 26.0–34.0)
MCHC: 32.7 g/dL (ref 32.0–36.0)
MCV: 89.9 fL (ref 80.0–100.0)
PLATELETS: 224 10*3/uL (ref 150–440)
RBC: 3.9 MIL/uL (ref 3.80–5.20)
RDW: 14.9 % — AB (ref 11.5–14.5)
WBC: 5.5 10*3/uL (ref 3.6–11.0)

## 2017-02-07 LAB — BASIC METABOLIC PANEL
Anion gap: 7 (ref 5–15)
BUN: 11 mg/dL (ref 6–20)
CO2: 29 mmol/L (ref 22–32)
CREATININE: 1.21 mg/dL — AB (ref 0.44–1.00)
Calcium: 9 mg/dL (ref 8.9–10.3)
Chloride: 103 mmol/L (ref 101–111)
GFR calc Af Amer: 57 mL/min — ABNORMAL LOW (ref >60)
GFR calc non Af Amer: 49 mL/min — ABNORMAL LOW (ref >60)
GLUCOSE: 103 mg/dL — AB (ref 65–99)
Potassium: 3.8 mmol/L (ref 3.5–5.1)
Sodium: 139 mmol/L (ref 135–145)

## 2017-02-07 LAB — TROPONIN I: Troponin I: <0.03 ng/mL (ref <0.03)

## 2017-02-07 LAB — FIBRIN DERIVATIVES D-DIMER (ARMC ONLY): FIBRIN DERIVATIVES D-DIMER (ARMC): 908.01 ng{FEU}/mL — AB (ref 0.00–499.00)

## 2017-02-07 MED ORDER — IOPAMIDOL (ISOVUE-370) INJECTION 76%
75.0000 mL | Freq: Once | INTRAVENOUS | Status: AC | PRN
  Administered 2017-02-07: 75 mL via INTRAVENOUS

## 2017-02-07 MED ORDER — LORAZEPAM 1 MG PO TABS: 1.0000 mg | ORAL_TABLET | Freq: Three times a day (TID) | ORAL | 0 refills | Status: AC | PRN

## 2017-02-07 MED ORDER — SODIUM CHLORIDE 0.9 % IV BOLUS (SEPSIS)
1000.0000 mL | Freq: Once | INTRAVENOUS | Status: AC
  Administered 2017-02-07: 1000 mL via INTRAVENOUS

## 2017-02-07 NOTE — ED Triage Notes (Signed)
Pt in via POV; sent over from cardiologist due to positive d-dimer.  Pt reports intermittent shortness of breath over the last two weeks, worse with exertion.  Vitals WDL, NAD noted at this time.

## 2017-02-07 NOTE — Telephone Encounter (Signed)
Robin Howard,   Can you help with this please since I'm not her Friday and Monday- this patient had an elevated d-dimer today and went for a CTA. This was negative, so per Thurmond Butts, she needs to be set up for a right and left heart cath with Dr. Fletcher Anon.   Thurmond Butts said just to let him know what date this gets scheduled for and he will put the orders in.  Thank you!

## 2017-02-07 NOTE — ED Provider Notes (Signed)
[facility] Emergency Department Provider Note       Time seen: ----------------------------------------- 3:39 PM on 02/07/2017 -----------------------------------------   I have reviewed the triage vital signs and the nursing notes.  HISTORY   Chief Complaint Shortness of Breath    HPI Robin Howard is a 57 y.o. female with a history of anemia, breast cancer, bronchitis, coronary artery disease who presents to the ED for a positive d-dimer.  Patient was sent here by her cardiologist for an elevated d-dimer.  She reports intermittent shortness of breath over the past 2 weeks that is worse with exertion.  Patient was told to come to the ER because her d-dimer was elevated.  She will be scheduled for a heart catheterization if CT imaging today is negative.  She denies fevers, chills or other complaints.  Past Medical History:  Diagnosis Date  . Anemia    h/o  . Breast cancer ([place]) 07/2014   T1c,N0, ER/ PR negative, Her 2 neu positive. Wide excision, SLN, Mammosite, adjuvant chemotherapy.   . Bronchitis 04-2014  . Coronary artery disease, non-occlusive    a. LHC 4/16 w/ mild nonobstructive disease affecting the LAD with tortuous vessels. CTA in 2017 showed only 30% disease in the LAD. bCarlton Adam MV 6/18: neg for signficant ischemia, EF 54%, low risk scan  . Detached retina 1997  . Family history of adverse reaction to anesthesia    pts dad has pseudocholinestrace deficieny  . Gastritis   . GERD (gastroesophageal reflux disease)   . History of nuclear stress test    a. MV 6/18: no significant ischemia, normal wall motion, EF 54%. Low risk scan  . HTN (hypertension)   . Hx of papillary thyroid carcinoma 02/05/2017   Dec 2018; T3aN0Mx  s/p total thyroidectomy Dec 19, 2016, s/p I-131 RAI 01/02/17  . Hypothyroidism   . Insomnia   . Last menstrual period (LMP) > 10 days ago    LMP 2014  . Major depressive disorder, recurrent ([place])   . Migraines   . Morbid  obesity ([place])   . Orthostatic hypotension    a. TTE 1/19:  EF 55-60%, normal wall motion, grade 2 diastolic dysfunction, mild aortic regurgitation, mildly calcified mitral annulus, trivial pericardial effusion was identified  . OSA (obstructive sleep apnea)    untreated due to lack of insurance  . Overactive bladder   . Pericarditis 1997  . Thyroid nodule 10/24/2016   4.7 cm, October 2018; referred for biopsy    Patient Active Problem List   Diagnosis Date Noted  . Hx of papillary thyroid carcinoma 02/05/2017  . Medication monitoring encounter 08/22/2016  . Perioral dermatitis 05/18/2016  . Obesity (BMI 35.0-39.9 without comorbidity) 03/04/2016  . Hypocalcemia 09/22/2015  . Coronary artery disease   . NSTEMI (non-ST elevated myocardial infarction) ([place]) 01/25/2015  . Angina pectoris ([place]) 01/24/2015  . Breast cancer of lower-outer quadrant of left female breast ([place]) 08/10/2014  . Migraine 07/15/2014  . Fatigue 07/15/2014  . Major depressive disorder, recurrent ([place])   . HTN (hypertension)   . Overactive bladder   . Hypothyroidism   . OSA (obstructive sleep apnea)   . GERD (gastroesophageal reflux disease)   . Chronic headache     Past Surgical History:  Procedure Laterality Date  . BACK SURGERY     L4-5  . BREAST [facility]OPSY [facility] 07/2014   [facility]  . BREAST CYST ASPIRATION Left 12/13/2014   3:00 position 2 cmfn, fat necrosis per Dr. Dwyane Luo notes  .  BREAST LUMPECTOMY WITH SENTINEL LYMPH NODE BIOPSY Left 08/13/2014   Procedure: BREAST LUMPECTOMY WITH SENTINEL LYMPH NODE BIOPSY;  Surgeon: Robert Bellow, MD;  Location: [facility] ORS;  Service: General;  Laterality: Left;  . BREAST MAMMOSITE Left 08/30/2014   Procedure: MAMMOSITE BREAST;  Surgeon: Robert Bellow, MD;  Location: [facility] ORS;  Service: General;  Laterality: Left;  . CESAREAN SECTION    . COLONOSCOPY WITH PROPOFOL N/A 04/18/2016   Procedure: COLONOSCOPY WITH PROPOFOL;  Surgeon: Robert Bellow, MD;  Location: [facility]  ENDOSCOPY;  Service: Endoscopy;  Laterality: N/A;  . CORONARY ANGIOPLASTY  April 2016  . EYE SURGERY Left 10/16/2015   retina repair  . FOOT SURGERY    . FORAMINOTOMY 1 LEVEL  [id number]  . KNEE SURGERY    . LAMINECTOMY  [id number]  . PORTACATH PLACEMENT Right 08/30/2014   Procedure: INSERTION PORT-A-CATH;  Surgeon: Robert Bellow, MD;  Location: [facility] ORS;  Service: General;  Laterality: Right;  . POSTERIOR LAMINECTOMY / DECOMPRESSION LUMBAR SPINE  10/29/2016   L3-5  . RETINAL DETACHMENT SURGERY Right 1997  . TOTAL THYROIDECTOMY  12/19/2016  . TOTAL THYROIDECTOMY Bilateral 12/19/2016   [facility]    Allergies Reglan [metoclopramide]; Paxil [paroxetine hcl]; Other; Tape; Tegaderm ag mesh [silver]; and Ultram [tramadol]  Social History Social History   Tobacco Use  . Smoking status: Never Smoker  . Smokeless tobacco: Never Used  Substance Use Topics  . Alcohol use: No  . Drug use: No    Review of Systems Constitutional: Negative for fever. Cardiovascular: Negative for chest pain. Respiratory: Positive for shortness of breath Gastrointestinal: Negative for abdominal pain, vomiting and diarrhea. Musculoskeletal: Negative for back pain. Skin: Negative for rash. Neurological: Negative for headaches, focal weakness or numbness.  All systems negative/normal/unremarkable except as stated in the HPI  ____________________________________________   PHYSICAL EXAM:  VITAL SIGNS: ED Triage Vitals  Enc Vitals Group     BP 02/07/17 1213 (!) 151/87     Pulse Rate 02/07/17 1213 90     Resp 02/07/17 1213 16     Temp 02/07/17 1213 98.9 F (37.2 C)     Temp Source 02/07/17 1213 Oral     SpO2 02/07/17 1213 100 %     Weight 02/07/17 1214 259 lb (117.5 kg)     Height 02/07/17 1214 5\' 5"  (1.651 m)     Head Circumference --      Peak Flow --      Pain Score 02/07/17 1214 5     Pain Loc --      Pain Edu? --      Excl. in [place]? --     Constitutional: Alert and oriented. Well appearing and  in no distress. Eyes: Conjunctivae are normal. Normal extraocular movements. ENT   Head: Normocephalic and atraumatic.   Nose: No congestion/rhinnorhea.   Mouth/Throat: Mucous membranes are moist.   Neck: No stridor. Cardiovascular: Normal rate, regular rhythm. No murmurs, rubs, or gallops. Respiratory: Normal respiratory effort without tachypnea nor retractions. Breath sounds are clear and equal bilaterally. No wheezes/rales/rhonchi. Gastrointestinal: Soft and nontender. Normal bowel sounds Musculoskeletal: Nontender with normal range of motion in extremities. No lower extremity tenderness nor edema. Neurologic:  Normal speech and language. No gross focal neurologic deficits are appreciated.  Skin:  Skin is warm, dry and intact. No rash noted. Psychiatric: Mood and affect are normal. Speech and behavior are normal.  ____________________________________________  EKG: Interpreted by me.  Sinus rhythm rate 87 bpm, normal PR interval, low  voltage QRS, nonspecific T wave changes  ____________________________________________  ED COURSE:  As part of my medical decision making, I reviewed the following data within the [place] History obtained from family if available, nursing notes, old chart and ekg, as well as notes from prior ED visits. Patient presented for dyspnea on exertion and elevated d-dimer, we will assess with labs and imaging as indicated at this time.   Procedures ____________________________________________   LABS (pertinent positives/negatives)  Labs Reviewed  BASIC METABOLIC PANEL - Abnormal; Notable for the following components:      Result Value   Glucose, Bld 103 (*)    Creatinine, Ser 1.21 (*)    GFR calc non Af Amer 49 (*)    GFR calc Af Amer 57 (*)    All other components within normal limits  CBC - Abnormal; Notable for the following components:   Hemoglobin 11.5 (*)    RDW 14.9 (*)    All other components within normal limits     RADIOLOGY CT angiogram of the chest IMPRESSION: No evidence of pulmonary emboli.  No acute abnormality noted.   ____________________________________________  DIFFERENTIAL DIAGNOSIS   PE, pneumothorax, pneumonia, MI, unstable angina, pulmonary artery hypertension, anxiety  FINAL ASSESSMENT AND PLAN  Dyspnea   Plan: Patient had presented for dyspnea on exertion. Patient's labs were negative. Patient's imaging did not reveal any acute process, specifically no PE.  EKG is unchanged from prior.  She is stable for outpatient follow-up with cardiology as previously scheduled.   Earleen Newport, MD   Note: This note was generated in part or whole with voice recognition software. Voice recognition is usually quite accurate but there are transcription errors that can and very often do occur. I apologize for any typographical errors that were not detected and corrected.     Earleen Newport, MD 02/07/17 [phone]

## 2017-02-07 NOTE — Patient Instructions (Addendum)
Medication Instructions: - Your physician recommends that you continue on your current medications as directed. Please refer to the Current Medication list given to you today.  Labwork: - Your physician recommends that you have lab work today: D-Dimer  Procedures/Testing: - none ordered  Follow-Up: - pending  Any Additional Special Instructions Will Be Listed Below (If Applicable).     If you need a refill on your cardiac medications before your next appointment, please call your pharmacy.

## 2017-02-07 NOTE — Telephone Encounter (Signed)
Patient was in office today with Robin Howard She was sent to have tests done and the CAT scan was negative  She did have real high blood pressure while waiting in ER but has since gone down She is wondering if there will still be a heart cath scheduled  Please call to discuss

## 2017-02-08 ENCOUNTER — Other Ambulatory Visit: Payer: Self-pay

## 2017-02-08 DIAGNOSIS — Z01812 Encounter for preprocedural laboratory examination: Secondary | ICD-10-CM

## 2017-02-08 NOTE — Telephone Encounter (Signed)
S/w pt who is agreeable to January 28 right and left heart cath. Left message with [facility] scheduling to call back with arrival time.

## 2017-02-08 NOTE — Telephone Encounter (Signed)
R/L cardiac catheterization scheduled Jan 28; pt arrival at 10:00am for PT/INR. Procedure scheduled 11:30am Dr. Fletcher Anon aware. Scheduled with Pamala Hurry; sent to pre-cert Reviewed w/pt who verbalized understanding and is agreeable w/plan.  Pt reports some bilateral hand swelling this morning. Improving throughout the morning.  She was given fluid in the ER yesterday prior to CT scan.  Creatinine 1.21 Pt not taking diuretic. She will elevate arms on a pillow today when possible and call back this afternoon to report if sx have improved. Cardiac cath instructions sent to patient through [place].

## 2017-02-08 NOTE — Telephone Encounter (Signed)
Pt called back to report hand swelling is a little better than this morning but still swollen, stating she can not get her hand into her coffee cup to wash it.   She reports the following BP readings: 144/105 this morning 154/109  At lunch HR 102 150/112  Now  Metoprolol and losartan d/c'd at Jan 8 OV due to orthostatic hypotension.  BP 128/94 at yesterday's OV; 151/87 in ED and pt reports it increased again to 180s while there. She was given fluid prior to CT chest to r/o PE.   Reviewed with Ignacia Bayley, NP who advised to take metoprolol 12.5mg  qd. Continue to monitor. Reviewed with patient. She understands that she may contact the on-call physician/APP over the weekend if further concerns.

## 2017-02-11 ENCOUNTER — Ambulatory Visit
Admission: RE | Admit: 2017-02-11 | Discharge: 2017-02-11 | Disposition: A | Discharge status: Home / Self Care | Payer: Self-pay | Source: Ambulatory Visit | Attending: Cardiovascular Disease | Admitting: Cardiovascular Disease

## 2017-02-11 ENCOUNTER — Encounter
Admission: RE | Disposition: A | Discharge status: Home / Self Care | Payer: Self-pay | Source: Ambulatory Visit | Attending: Cardiovascular Disease

## 2017-02-11 ENCOUNTER — Other Ambulatory Visit
Admission: RE | Admit: 2017-02-11 | Discharge: 2017-02-11 | Disposition: A | Discharge status: Home / Self Care | Payer: Self-pay | Source: Ambulatory Visit | Attending: Cardiovascular Disease | Admitting: Cardiovascular Disease

## 2017-02-11 DIAGNOSIS — K219 Gastro-esophageal reflux disease without esophagitis: Secondary | ICD-10-CM | POA: Insufficient documentation

## 2017-02-11 DIAGNOSIS — Z7982 Long term (current) use of aspirin: Secondary | ICD-10-CM | POA: Insufficient documentation

## 2017-02-11 DIAGNOSIS — R079 Chest pain, unspecified: Secondary | ICD-10-CM | POA: Insufficient documentation

## 2017-02-11 DIAGNOSIS — Z853 Personal history of malignant neoplasm of breast: Secondary | ICD-10-CM | POA: Insufficient documentation

## 2017-02-11 DIAGNOSIS — Z01812 Encounter for preprocedural laboratory examination: Secondary | ICD-10-CM

## 2017-02-11 DIAGNOSIS — E890 Postprocedural hypothyroidism: Secondary | ICD-10-CM | POA: Insufficient documentation

## 2017-02-11 DIAGNOSIS — Z8349 Family history of other endocrine, nutritional and metabolic diseases: Secondary | ICD-10-CM | POA: Insufficient documentation

## 2017-02-11 DIAGNOSIS — I2510 Atherosclerotic heart disease of native coronary artery without angina pectoris: Secondary | ICD-10-CM | POA: Insufficient documentation

## 2017-02-11 DIAGNOSIS — Z8249 Family history of ischemic heart disease and other diseases of the circulatory system: Secondary | ICD-10-CM | POA: Insufficient documentation

## 2017-02-11 DIAGNOSIS — E785 Hyperlipidemia, unspecified: Secondary | ICD-10-CM | POA: Insufficient documentation

## 2017-02-11 DIAGNOSIS — Z9221 Personal history of antineoplastic chemotherapy: Secondary | ICD-10-CM | POA: Insufficient documentation

## 2017-02-11 DIAGNOSIS — K2970 Gastritis, unspecified, without bleeding: Secondary | ICD-10-CM | POA: Insufficient documentation

## 2017-02-11 DIAGNOSIS — R202 Paresthesia of skin: Secondary | ICD-10-CM | POA: Insufficient documentation

## 2017-02-11 DIAGNOSIS — R9431 Abnormal electrocardiogram [ECG] [EKG]: Secondary | ICD-10-CM | POA: Insufficient documentation

## 2017-02-11 DIAGNOSIS — R0600 Dyspnea, unspecified: Secondary | ICD-10-CM | POA: Insufficient documentation

## 2017-02-11 DIAGNOSIS — Z811 Family history of alcohol abuse and dependence: Secondary | ICD-10-CM | POA: Insufficient documentation

## 2017-02-11 DIAGNOSIS — Z79899 Other long term (current) drug therapy: Secondary | ICD-10-CM | POA: Insufficient documentation

## 2017-02-11 DIAGNOSIS — Z833 Family history of diabetes mellitus: Secondary | ICD-10-CM | POA: Insufficient documentation

## 2017-02-11 DIAGNOSIS — R0602 Shortness of breath: Secondary | ICD-10-CM

## 2017-02-11 DIAGNOSIS — Z818 Family history of other mental and behavioral disorders: Secondary | ICD-10-CM | POA: Insufficient documentation

## 2017-02-11 DIAGNOSIS — G43909 Migraine, unspecified, not intractable, without status migrainosus: Secondary | ICD-10-CM | POA: Insufficient documentation

## 2017-02-11 DIAGNOSIS — F329 Major depressive disorder, single episode, unspecified: Secondary | ICD-10-CM | POA: Insufficient documentation

## 2017-02-11 DIAGNOSIS — M549 Dorsalgia, unspecified: Secondary | ICD-10-CM | POA: Insufficient documentation

## 2017-02-11 DIAGNOSIS — I10 Essential (primary) hypertension: Secondary | ICD-10-CM | POA: Insufficient documentation

## 2017-02-11 DIAGNOSIS — N3281 Overactive bladder: Secondary | ICD-10-CM | POA: Insufficient documentation

## 2017-02-11 DIAGNOSIS — Z809 Family history of malignant neoplasm, unspecified: Secondary | ICD-10-CM | POA: Insufficient documentation

## 2017-02-11 DIAGNOSIS — D649 Anemia, unspecified: Secondary | ICD-10-CM | POA: Insufficient documentation

## 2017-02-11 DIAGNOSIS — F419 Anxiety disorder, unspecified: Secondary | ICD-10-CM | POA: Insufficient documentation

## 2017-02-11 DIAGNOSIS — G8929 Other chronic pain: Secondary | ICD-10-CM | POA: Insufficient documentation

## 2017-02-11 DIAGNOSIS — Z85850 Personal history of malignant neoplasm of thyroid: Secondary | ICD-10-CM | POA: Insufficient documentation

## 2017-02-11 DIAGNOSIS — G4733 Obstructive sleep apnea (adult) (pediatric): Secondary | ICD-10-CM | POA: Insufficient documentation

## 2017-02-11 DIAGNOSIS — G4700 Insomnia, unspecified: Secondary | ICD-10-CM | POA: Insufficient documentation

## 2017-02-11 DIAGNOSIS — Z6841 Body Mass Index (BMI) 40.0 and over, adult: Secondary | ICD-10-CM | POA: Insufficient documentation

## 2017-02-11 DIAGNOSIS — Z825 Family history of asthma and other chronic lower respiratory diseases: Secondary | ICD-10-CM | POA: Insufficient documentation

## 2017-02-11 DIAGNOSIS — I951 Orthostatic hypotension: Secondary | ICD-10-CM | POA: Insufficient documentation

## 2017-02-11 DIAGNOSIS — Z888 Allergy status to other drugs, medicaments and biological substances status: Secondary | ICD-10-CM | POA: Insufficient documentation

## 2017-02-11 HISTORY — PX: RIGHT/LEFT HEART CATH AND CORONARY ANGIOGRAPHY: CATH118266

## 2017-02-11 LAB — PROTIME-INR
INR: 0.87
PROTHROMBIN TIME: 11.7 s (ref 11.4–15.2)

## 2017-02-11 SURGERY — RIGHT/LEFT HEART CATH AND CORONARY ANGIOGRAPHY
Anesthesia: Moderate Sedation | Laterality: Bilateral

## 2017-02-11 MED ORDER — SODIUM CHLORIDE 0.9% FLUSH: 3.0000 mL | INTRAVENOUS | Status: DC | PRN

## 2017-02-11 MED ORDER — SODIUM CHLORIDE 0.9 % IV SOLN: 250.0000 mL | INTRAVENOUS | Status: DC | PRN

## 2017-02-11 MED ORDER — SODIUM CHLORIDE 0.9% FLUSH: 3.0000 mL | Freq: Two times a day (BID) | INTRAVENOUS | Status: DC

## 2017-02-11 MED ORDER — SODIUM CHLORIDE 0.9 % IV SOLN: INTRAVENOUS | Status: DC

## 2017-02-11 MED ORDER — SODIUM CHLORIDE 0.9 % IV SOLN
INTRAVENOUS | Status: DC
  Administered 2017-02-11: 12:00:00 via INTRAVENOUS

## 2017-02-11 MED ORDER — IOPAMIDOL (ISOVUE-300) INJECTION 61%
INTRAVENOUS | Status: DC | PRN
  Administered 2017-02-11: 60 mL via INTRA_ARTERIAL

## 2017-02-11 MED ORDER — HEPARIN SODIUM (PORCINE) 1000 UNIT/ML IJ SOLN
INTRAMUSCULAR | Status: AC
  Filled 2017-02-11: qty 1

## 2017-02-11 MED ORDER — HEPARIN (PORCINE) IN NACL 2-0.9 UNIT/ML-% IJ SOLN
INTRAMUSCULAR | Status: AC
  Filled 2017-02-11: qty 1000

## 2017-02-11 MED ORDER — FENTANYL CITRATE (PF) 100 MCG/2ML IJ SOLN
INTRAMUSCULAR | Status: AC
  Filled 2017-02-11: qty 2

## 2017-02-11 MED ORDER — HEPARIN SODIUM (PORCINE) 1000 UNIT/ML IJ SOLN
INTRAMUSCULAR | Status: DC | PRN
  Administered 2017-02-11: 6000 [IU] via INTRAVENOUS

## 2017-02-11 MED ORDER — FENTANYL CITRATE (PF) 100 MCG/2ML IJ SOLN
INTRAMUSCULAR | Status: DC | PRN
  Administered 2017-02-11: 50 ug via INTRAVENOUS

## 2017-02-11 MED ORDER — VERAPAMIL HCL 2.5 MG/ML IV SOLN
INTRAVENOUS | Status: AC
  Filled 2017-02-11: qty 2

## 2017-02-11 MED ORDER — MIDAZOLAM HCL 2 MG/2ML IJ SOLN
INTRAMUSCULAR | Status: AC
  Filled 2017-02-11: qty 2

## 2017-02-11 MED ORDER — MIDAZOLAM HCL 2 MG/2ML IJ SOLN
INTRAMUSCULAR | Status: DC | PRN
  Administered 2017-02-11 (×2): 1 mg via INTRAVENOUS

## 2017-02-11 MED ORDER — ASPIRIN 81 MG PO CHEW: 81.0000 mg | CHEWABLE_TABLET | ORAL | Status: DC

## 2017-02-11 SURGICAL SUPPLY — 10 items
CATH BALLN WEDGE 5F 110CM (CATHETERS) ×2 IMPLANT
CATH INFINITI 5 FR JL3.5 (CATHETERS) ×2 IMPLANT
CATH OPTITORQUE JACKY 4.0 5F (CATHETERS) ×2 IMPLANT
DEVICE RAD TR BAND REGULAR (VASCULAR PRODUCTS) ×2 IMPLANT
GLIDESHEATH SLEND SS 6F .021 (SHEATH) ×2 IMPLANT
KIT MANI 3VAL PERCEP (MISCELLANEOUS) ×3 IMPLANT
KIT RIGHT HEART (MISCELLANEOUS) ×3 IMPLANT
PACK CARDIAC CATH (CUSTOM PROCEDURE TRAY) ×3 IMPLANT
SHEATH GLIDE SLENDER 4/5FR (SHEATH) ×2 IMPLANT
WIRE ROSEN-J .035X260CM (WIRE) ×2 IMPLANT

## 2017-02-11 NOTE — Discharge Instructions (Signed)
Transradial Angiogram °Transradial angiogram is an imaging test. This test uses X-ray images and colored dye that is made up of an iodine solution (contrast dye). This test is done to check for any abnormalities in the vessels that might affect blood flow, such as: °· A blocked blood vessel. °· A narrowed blood vessel. °· A blood clot. °· Abnormal, inherited blood vessel connections. ° °During this test, a small, flexible tube (catheter) is inserted into an artery in the wrist (radial artery). The catheter is moved from the radial artery into other blood vessels in the body that need to be examined. Contrast dye is used to make blood vessels visible on X-ray images that are taken during the procedure. °Tell a health care provider about: °· Any allergies you have. °· All medicines you are taking, including vitamins, herbs, eye drops, creams, and over-the-counter medicines. °· Any problems you or family members have had with anesthetic medicines. °· Any blood disorders you have. °· Any surgeries you have had. °· Any medical conditions you have. °· Whether you are pregnant or may be pregnant. °What are the risks? °Generally, this is a safe procedure. However, problems may occur, including: °· Infection. °· Bleeding. °· Allergic reactions to medicines or dyes. °· Damage to other structures or organs, such as the blood vessels, lungs, or heart. °· Blood clots. °· Blood flow through the radial artery stopping or slowing down. This is rare. ° °What happens before the procedure? °· Ask your health care provider about: °? Changing or stopping your regular medicines. This is especially important if you are taking diabetes medicines or blood thinners. °? Taking medicines such as aspirin and ibuprofen. These medicines can thin your blood. Do not take these medicines before your procedure if your health care provider instructs you not to. °· Follow instructions from your health care provider about eating or drinking  restrictions. °· Do not use tobacco products for at least 24 hours before your procedure or as told by your health care provider. This includes cigarettes, chewing tobacco, or e-cigarettes. °· Ask your health care provider how your surgical site will be marked or identified. °· You may be given antibiotic medicine to help prevent infection. °· You may have a physical exam. °· You may have tests, including: °? Blood tests. °? X-rays. °· Plan to have someone take you home after the procedure. °· If you will be going home right after the procedure, plan to have someone with you for 24 hours. °What happens during the procedure? °· To reduce your risk of infection: °? Your health care team will wash or sanitize their hands. °? Your skin will be washed with soap. °· An IV tube will be inserted into one of your veins. °· You will be given the following: °? A medicine to help you relax (sedative). °? A medicine that is injected to numb the area near the radial artery in your wrist (local anesthetic). °· A needle will be inserted into your radial artery in your wrist. °· A catheter will be inserted into your radial artery. The needle helps guide the catheter into your radial artery. °· The catheter will be moved through your body to the desired blood vessel. An X-ray machine (fluoroscope) will help your health care provider bring the catheter to the correct place in your body. °· Contrast dye will be injected into the catheter and will travel to the blood vessel that is being examined. °· X-ray images will be taken of how the   dye flows through your blood vessel. While the images are being taken, you may be given instructions on breathing, swallowing, moving, or talking. °· The catheter and needle will be removed from your body. °· A pressure (compression) wrap will be applied to your wrist to stop bleeding. °The procedure may vary among health care providers and hospitals. °What happens after the procedure? °· You will need  to keep your wrist still for as long as told by your health care provider. °· The pressure applied to your wrist will be gradually decreased until the compression wrap is removed. °· You may have soreness and bruising in your wrist. This is normal. This should get better within about 1 week. °· Your blood pressure, heart rate, breathing rate, and blood oxygen level will be monitored often until the medicines you were given have worn off. °· You may continue to receive fluids and medicines through an IV tube. °· Do not drive for 24 hours if you received a sedative. °· You may have to wear compression stockings. These stockings help to prevent blood clots and reduce swelling in your legs. °This information is not intended to replace advice given to you by your health care provider. Make sure you discuss any questions you have with your health care provider. °Document Released: 09/26/2011 Document Revised: 09/04/2015 Document Reviewed: 12/06/2014 °Elsevier Interactive Patient Education © 2018 [employer] ° °

## 2017-02-11 NOTE — Interval H&P Note (Signed)
History and Physical Interval Note:  02/11/2017 12:43 PM  Robin Howard  has presented today for surgery, with the diagnosis of RT and LT Cath    Shortness of breath  The various methods of treatment have been discussed with the patient and family. After consideration of risks, benefits and other options for treatment, the patient has consented to  Procedure(s): RIGHT/LEFT HEART CATH AND CORONARY ANGIOGRAPHY (Bilateral) as a surgical intervention .  The patient's history has been reviewed, patient examined, no change in status, stable for surgery.  I have reviewed the patient's chart and labs.  Questions were answered to the patient's satisfaction.     Kathlyn Sacramento

## 2017-02-12 ENCOUNTER — Encounter: Payer: Self-pay | Admitting: Cardiovascular Disease

## 2017-02-13 NOTE — Progress Notes (Signed)
Cardiology Office Note Date:  02/15/2017  Patient ID:  Robin Howard, Robin Howard 03/03/1960, MRN [id number] PCP:  Arnetha Courser, MD  Cardiologist:  Dr. Fletcher Anon, MD    Chief Complaint: Post-cath follow up  History of Present Illness: Robin Howard is a 57 y.o. female with history of mild nonobstructive CAD by cardiac cath in 04/2014 and on 02/11/2017 as detailed below with normal EF, breast cancer in 07/2015 s/p lumpectomy followed by chemotherapy, completing Herceptin in the fall of 2017. 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   MUGA scan from 07/2015 showed stable EF of 56%. She was seen by Dr. Fletcher Anon in 10/2015 for routine follow up and was doing well at that time. She underwent colonoscopy in 04/2016 that was normal.     Previously admitted to the hospital and 04/2014 with chest pain found to have mild troponin elevation with peak of 0.50. Cardiac catheterization done at that time by outside group apparently showed nonobstructive CAD (report not available for review at this time). She again presented to the hospital with recurrence of chest pain in 01/2015 and was found to have elevated troponin with peak of 1.02 and started on heparin drip. She was consulted on by her cardiologist at that time who recommended discontinuation of heparin drip, patient discharge, and outpatient nuclear stress testing. Per patient report she followed up with the stress test though we do not have results for review at this time. She also stated she had what sounds to be a coronary artery CT with a self-reported "30% blockage." She was seen by myself in 06/2016, and was doing well at that time. She did note occasional intermittent sharp substernal chest pain that was not associated with exertion or rest. She underwent  Lexiscan Myoview on 06/27/16 that showed no significant ischemia, normal wall motion, EF 54%. Low risk scan. She underwent successful lumbar laminectomy in 10/2016. Since that time, she has had recurrence of lumbar and hip pain with increase in pain medication. She has reported 2 syncopal episodes since her surgery in October, 2018. Blood pressure has been labile, ranging from the 25K to 270W systolic. Her syncopal episodes were felt to be orthostatic in etiology in the setting of increased pain medications. Her beta blocker was decreased by PCP. In follow up on 01/22/2017, she was somewhat overwhelmed and anxious. She continued to feel intermittently dizzy associated with positional changes as well as with ambulation. Per her report, blood pressure readings at home had been in the 237S systolic. She was taking losartan 25 mg daily and Toprol 25 mg daily (each typically midday), and had not taken her medications yet on 1/8 when she was seen. She was not taking Flexeril or narcotic pain medication. She reported adequate hydration. She also noted intermittent sharp chest pain that typically self resolved. BP was noted to be soft at 100/72 with orthostatic vitals signs being positive. Her losartan and Toprol were stopped at that time and she was advised to increase her hydration. She was advised to discuss her recent new medication, gabapentin, with her PCP. CBC was unremarkable. CMETshowed a SCr of 1.23 (basline 0.9-1.1). Echo performed on 02/05/17 showed an EF of 55-60%, mild LVH, not technically sufficient to allow for LV diastolic function, RV cavity size normal with normal systolic function. She was evaluated in the office on  1/24 for follow up and noted continued SOB with exertion along with right-sided chest pain. She was mildly tachycardic with normal pulse ox. Because of her SOB, chest pain, recent cancer diagnosis and sinus tachycardia noted during her office visit we ordered a D-dimer that was elevated. She  proceeded to the ED for further evaluation on 1/24 and underwent CTA chest that was negative for PE. She underwent R/LHC on 02/11/2017 that showed minor luminal irregularities without evidence of obstructive CAD, normal LV systolic function with an EF of 55-65%, normal LVEDP. Details include proximal LAD 15 % stenosis, otherwise normal coronary arteries. RHC showed normal filling pressures, normal pulmonary pressure and normal cardiac output. It was felt her SOB and chest pain were not cardiac in orgin.   She comes in doing well from a cardiac perspective. No chest pain. She is uncertain how her SOB is as she reports she has not been focusing on her SOB. Blood pressure at home has been running in the 130s to 150s/80s to 90s with heart rates in the 70s to 90s bpm. Recently resumed on Lopressor 12.5 mg qhs without issues. Notes bilateral hand swelling and paresthesias. Has chronic cervical neck pain with self reported loss of lordosis.    Past Medical History:  Diagnosis Date  . Anemia    h/o  . Breast cancer ([place]) 07/2014   T1c,N0, ER/ PR negative, Her 2 neu positive. Wide excision, SLN, Mammosite, adjuvant chemotherapy.   . Bronchitis 04-2014  . Coronary artery disease, non-occlusive    a. LHC 4/16 w/ mild nonobstructive disease affecting the LAD with tortuous vessels. CTA in 2017 showed only 30% disease in the LAD. bCarlton Adam MV 6/18: neg for signficant ischemia, EF 54%, low risk scan  . Detached retina 1997  . Family history of adverse reaction to anesthesia    pts dad has pseudocholinestrace deficieny  . Gastritis   . GERD (gastroesophageal reflux disease)   . History of nuclear stress test    a. MV 6/18: no significant ischemia, normal wall motion, EF 54%. Low risk scan  . HTN (hypertension)   . Hx of papillary thyroid carcinoma 02/05/2017   Dec 2018; T3aN0Mx  s/p total thyroidectomy Dec 19, 2016, s/p I-131 RAI 01/02/17  . Hypothyroidism   . Insomnia   . Last menstrual period (LMP) > 10  days ago    LMP 2014  . Major depressive disorder, recurrent ([place])   . Migraines   . Morbid obesity ([place])   . Orthostatic hypotension    a. TTE 1/19:  EF 55-60%, normal wall motion, grade 2 diastolic dysfunction, mild aortic regurgitation, mildly calcified mitral annulus, trivial pericardial effusion was identified  . OSA (obstructive sleep apnea)    untreated due to lack of insurance  . Overactive bladder   . Pericarditis 1997  . Thyroid nodule 10/24/2016   4.7 cm, October 2018; referred for biopsy    Past Surgical History:  Procedure Laterality Date  . BACK SURGERY     L4-5  . BREAST BIOPSY Left 07/2014   [facility]  . BREAST CYST ASPIRATION Left 12/13/2014   3:00 position 2 cmfn, fat necrosis per Dr. Dwyane Luo notes  . BREAST LUMPECTOMY WITH SENTINEL LYMPH NODE BIOPSY Left 08/13/2014   Procedure: BREAST LUMPECTOMY WITH SENTINEL LYMPH NODE BIOPSY;  Surgeon: Robert Bellow, MD;  Location: [facility] ORS;  Service: General;  Laterality: Left;  . BREAST MAMMOSITE Left 08/30/2014   Procedure: MAMMOSITE BREAST;  Surgeon: Robert Bellow, MD;  Location: [facility]  ORS;  Service: General;  Laterality: Left;  . CESAREAN SECTION    . COLONOSCOPY WITH PROPOFOL N/A 04/18/2016   Procedure: COLONOSCOPY WITH PROPOFOL;  Surgeon: Robert Bellow, MD;  [facility] ENDOSCOPY;  Service: Endoscopy;  Laterality: N/A;  . CORONARY ANGIOPLASTY  April 2016  . EYE SURGERY Left 10/16/2015   retina repair  . FOOT SURGERY    . FORAMINOTOMY 1 LEVEL  [id number]  . KNEE SURGERY    . LAMINECTOMY  [id number]  . PORTACATH PLACEMENT Right 08/30/2014   Procedure: INSERTION PORT-A-CATH;  Surgeon: Robert Bellow, MD;  Location: [facility] ORS;  Service: General;  Laterality: Right;  . POSTERIOR LAMINECTOMY / DECOMPRESSION LUMBAR SPINE  10/29/2016   L3-5  . RETINAL DETACHMENT SURGERY Right 1997  . RIGHT/LEFT HEART CATH AND CORONARY ANGIOGRAPHY Bilateral 02/11/2017   Procedure: RIGHT/LEFT HEART CATH AND CORONARY ANGIOGRAPHY;   Surgeon: Wellington Hampshire, MD;  Location: [facility];  Service: Cardiovascular;  Laterality: Bilateral;  . TOTAL THYROIDECTOMY  12/19/2016  . TOTAL THYROIDECTOMY Bilateral 12/19/2016   [facility]    Current Meds  Medication Sig  . acetaminophen (TYLENOL) 500 MG tablet Take 2 tablets (1,000 mg total) every 6 (six) hours as needed by mouth for mild pain or headache. Maximum dose of acetaminophen 3,000 mg total per day  . aspirin EC 81 MG tablet Take 81 mg by mouth daily.  Marland Kitchen buPROPion (WELLBUTRIN SR) 150 MG 12 hr tablet Take 150 mg by mouth daily.  . cyclobenzaprine (FLEXERIL) 5 MG tablet Take 5 mg by mouth 3 (three) times daily as needed for muscle spasms.   Marland Kitchen docusate sodium (COLACE) 100 MG capsule Take 100 mg by mouth daily.  . DULoxetine (CYMBALTA) 60 MG capsule Take 1 capsule (60 mg total) by mouth daily.  . fluticasone (FLONASE) 50 MCG/ACT nasal spray Place 2 sprays into both nostrils daily. (Patient taking differently: Place 2 sprays into both nostrils daily as needed for allergies. )  . gabapentin (NEURONTIN) 300 MG capsule Take 300 mg by mouth 3 (three) times daily.  Marland Kitchen levothyroxine (SYNTHROID, LEVOTHROID) 150 MCG tablet Take 150 mcg by mouth daily before breakfast.  . loratadine (CLARITIN) 10 MG tablet Take 10 mg by mouth daily as needed for allergies.   Marland Kitchen LORazepam (ATIVAN) 1 MG tablet Take 1 tablet (1 mg total) by mouth every 8 (eight) hours as needed for anxiety.  . metoprolol tartrate (LOPRESSOR) 25 MG tablet Take 12.5 mg by mouth daily.  Marland Kitchen omeprazole (PRILOSEC) 20 MG capsule Take 1 capsule (20 mg total) daily by mouth.  . Polyvinyl Alcohol-Povidone (REFRESH OP) Place 1 drop into both eyes daily.  . simvastatin (ZOCOR) 10 MG tablet Take 1 tablet (10 mg total) by mouth daily. (Patient taking differently: Take 10 mg by mouth at bedtime. )  . traZODone (DESYREL) 100 MG tablet Take 100 mg by mouth at bedtime.     Allergies:   Reglan [metoclopramide]; Paxil [paroxetine hcl]; Tape;  Tegaderm ag mesh [silver]; and Ultram [tramadol]   Social History:  The patient  reports that  has never smoked. she has never used smokeless tobacco. She reports that she does not drink alcohol or use drugs.   Family History:  The patient's family history includes Alcohol abuse in her mother; Arrhythmia in her father; Breast cancer (age of onset: 97) in her sister; COPD in her mother; Cancer in her father, maternal uncle, and sister; Congestive Heart Failure in her father; Depression in her father; Diabetes in her other;  Heart attack in her father; Heart disease (age of onset: 43) in her paternal grandfather; Heart failure in her father; Hypertension in her father and mother; Thyroid disease in her mother.  ROS:   Review of Systems  Constitutional: Positive for malaise/fatigue. Negative for chills, diaphoresis, fever and weight loss.  HENT: Negative for congestion.   Eyes: Negative for discharge and redness.  Respiratory: Negative for cough, hemoptysis, sputum production, shortness of breath and wheezing.   Cardiovascular: Negative for chest pain, palpitations, orthopnea, claudication, leg swelling and PND.  Gastrointestinal: Negative for abdominal pain, blood in stool, heartburn, melena, nausea and vomiting.  Genitourinary: Negative for hematuria.  Musculoskeletal: Negative for falls and myalgias.       Notes swelling of the bilateral hands  Skin: Negative for rash.  Neurological: Positive for tingling and weakness. Negative for dizziness, tremors, sensory change, speech change, focal weakness and loss of consciousness.       Tingling of the bilateral hands  Endo/Heme/Allergies: Does not bruise/bleed easily.  Psychiatric/Behavioral: Negative for substance abuse. The patient is not nervous/anxious.   All other systems reviewed and are negative.    PHYSICAL EXAM:  VS:  BP 130/90 (BP Location: Left Arm, Patient Position: Sitting, Cuff Size: Large)   Pulse 72   Ht 5' 5.5" (1.664 m)   Wt  255 lb 8 oz (115.9 kg)   LMP 08/26/2012 (Exact Date)   BMI 41.87 kg/m  BMI: Body mass index is 41.87 kg/m.  Physical Exam  Constitutional: She is oriented to person, place, and time. She appears well-developed and well-nourished.  HENT:  Head: Normocephalic and atraumatic.  Eyes: Right eye exhibits no discharge. Left eye exhibits no discharge.  Neck: Normal range of motion. No JVD present.  Cardiovascular: Normal rate, regular rhythm, S1 normal, S2 normal and normal heart sounds. Exam reveals no distant heart sounds, no friction rub, no midsystolic click and no opening snap.  No murmur heard. Pulses:      Posterior tibial pulses are 2+ on the right side, and 2+ on the left side.  Right radial cardiac cath site without bleeding, bruising, swelling, erythema, warmth, or TTP. Radial pulse 2+.  Pulmonary/Chest: Effort normal and breath sounds normal. No respiratory distress. She has no decreased breath sounds. She has no wheezes. She has no rales. She exhibits no tenderness.  Abdominal: Soft. She exhibits no distension. There is no tenderness.  Musculoskeletal: She exhibits no edema.  No appreciable swelling of the bilateral hands  Neurological: She is alert and oriented to person, place, and time.  Skin: Skin is warm and dry. No cyanosis. Nails show no clubbing.  Psychiatric: She has a normal mood and affect. Her speech is normal and behavior is normal. Judgment and thought content normal.     EKG:  Was ordered and interpreted by me today. Shows NSR, 72 bpm, normal axis, TWI I, V2-V6  Recent Labs: 09/14/2016: TSH 2.879 01/22/2017: ALT 25 02/07/2017: BUN 11; Creatinine, Ser 1.21; Hemoglobin 11.5; Platelets 224; Potassium 3.8; Sodium 139  08/22/2016: Cholesterol 162; HDL 47; LDL Cholesterol 78; Total CHOL/HDL Ratio 3.4; Triglycerides 187; VLDL 37   Estimated Creatinine Clearance: 67.4 mL/min (A) (by C-G formula based on SCr of 1.21 mg/dL (H)).   Wt Readings from Last 3 Encounters:    02/15/17 255 lb 8 oz (115.9 kg)  02/11/17 259 lb (117.5 kg)  02/07/17 259 lb (117.5 kg)     Other studies reviewed: Additional studies/records reviewed today include: summarized above  ASSESSMENT AND PLAN:  1. Minimal nonobstructive CAD: No further SOB. No chest pain. Recent R/LHC as above. SOB unlikely to be cardiac in etiology. Today she reports she has not been focusing on her SOB and is uncertain if she still has it. Recommend she follow up with her PCP for non-cardiac SOB. Post-cath instructions.   2. HTN: BP has been mildly elevated at home with readings ranging from 130s to 150s/80s to 90s with heart rates in the 70s to 90s bpm. Recently resumed on Lopressor 12.5 mg qhs. Will have her start taking Lopressor 12.5 mg bid and call with her BP and HR readings in 1 week.   3. Orthostasis: Will need to be mindful of her BP readings as we slowly resume metoprolol as above. Hydration.   4. Anxiety: Continues to be an issue for her. Recommend she follow up with PCP.   5. Bilateral hand paresthesias: Unlikely cardiac. Recommend she follow up with her PCP.   Disposition: F/u with Dr. Fletcher Anon in 6 months.   Current medicines are reviewed at length with the patient today.  The patient did not have any concerns regarding medicines.  Signed, Christell Faith, PA-C 02/15/2017 2:35 PM     [address] [place], [place] [address] [phone]

## 2017-02-15 ENCOUNTER — Ambulatory Visit (INDEPENDENT_AMBULATORY_CARE_PROVIDER_SITE_OTHER): Payer: Self-pay | Admitting: Physician Assistant

## 2017-02-15 ENCOUNTER — Encounter: Payer: Self-pay | Admitting: Physician Assistant

## 2017-02-15 VITALS — BP 130/90 | HR 72 | Ht 65.5 in | Wt 255.5 lb

## 2017-02-15 DIAGNOSIS — R202 Paresthesia of skin: Secondary | ICD-10-CM

## 2017-02-15 DIAGNOSIS — I10 Essential (primary) hypertension: Secondary | ICD-10-CM

## 2017-02-15 DIAGNOSIS — R0602 Shortness of breath: Secondary | ICD-10-CM

## 2017-02-15 DIAGNOSIS — I951 Orthostatic hypotension: Secondary | ICD-10-CM

## 2017-02-15 DIAGNOSIS — I2510 Atherosclerotic heart disease of native coronary artery without angina pectoris: Secondary | ICD-10-CM

## 2017-02-15 NOTE — Patient Instructions (Signed)
Medication Instructions: - Your physician has recommended you make the following change in your medication:  1) INCREASE metoprolol tartrate 25 mg- take 1/2 tablet (12.5 mg) by mouth TWICE daily  Labwork: - none ordered  Procedures/Testing: - none ordered  Follow-Up: - Your physician wants you to follow-up in: 6 months with Dr. Fletcher Anon.  You will receive a reminder letter in the mail two months in advance. If you don't receive a letter, please call our office to schedule the follow-up appointment.   Any Additional Special Instructions Will Be Listed Below (If Applicable). - Please call the office next week with your blood pressure & heart rate readings [phone]  Please record these readings at least 30 minutes - 1 hour after you take your medication    If you need a refill on your cardiac medications before your next appointment, please call your pharmacy.

## 2017-02-21 ENCOUNTER — Telehealth: Payer: Self-pay | Admitting: Cardiovascular Disease

## 2017-02-21 MED ORDER — METOPROLOL TARTRATE 25 MG PO TABS: 25.0000 mg | ORAL_TABLET | Freq: Two times a day (BID) | ORAL | 2 refills | Status: DC

## 2017-02-21 NOTE — Telephone Encounter (Signed)
Pt calling in her BP readings  02/16/17  152/95 HR 84  02/17/17 139/91 HR 78  02/18/17 137/92 HR 79  02/19/17 135/102 HR 72  02/20/17  131/93 HR 72  02/21/17 140/102 HR 78  These were taken a few hours after taking medications  Please advise

## 2017-02-21 NOTE — Telephone Encounter (Signed)
S/w patient and she verbalized understanding to take metoprolol 25 mg (1 tablet) by mouth two times a day. Rx sent to pharmacy.

## 2017-02-21 NOTE — Telephone Encounter (Signed)
S/w patient. BP readings since last office visit in previous entry. Reports no symptoms besides headache but states she has chronic migraines. Increased metoprolol at last OV on 02/15/17 and she has been taking. Advised I will route to [facility] for advice.

## 2017-02-21 NOTE — Telephone Encounter (Signed)
Please increase Lopressor to 25 mg bid.

## 2017-02-28 NOTE — Telephone Encounter (Signed)
Closing out MyChart message that has been open since June 2018; issue appears addressed

## 2017-02-28 NOTE — Progress Notes (Signed)
Closing out scanned document note open since  Oct 2018

## 2017-02-28 NOTE — Telephone Encounter (Signed)
Closing out note

## 2017-03-01 ENCOUNTER — Telehealth: Payer: Self-pay | Admitting: Cardiovascular Disease

## 2017-03-01 NOTE — Telephone Encounter (Signed)
Pt c/o BP issue: STAT if pt c/o blurred vision, one-sided weakness or slurred speech  1. What are your last 5 BP readings? 2/15-122/91 hr 72 2/14-123/92 HR 69 2/13-118/80 HR 71 2/12-114/84 HR 73 2/11-122/96 HR 70 2/10-116/80 HR 67 2/9-124/83 HR 72  2. Are you having any other symptoms (ex. Dizziness, headache, blurred vision, passed out)? Still having headache, vision blurry, but not anything new  3. What is your BP issue?

## 2017-03-04 NOTE — Telephone Encounter (Signed)
Much looks good. Recommend she follow up with PCP.

## 2017-03-04 NOTE — Telephone Encounter (Signed)
Patient notified and verbalized understanding to f/u with PCP.

## 2017-04-16 ENCOUNTER — Other Ambulatory Visit: Payer: Self-pay | Admitting: Family Medicine

## 2017-05-16 DIAGNOSIS — G479 Sleep disorder, unspecified: Secondary | ICD-10-CM | POA: Insufficient documentation

## 2017-05-20 ENCOUNTER — Other Ambulatory Visit: Payer: Self-pay | Admitting: Family Medicine

## 2017-05-31 ENCOUNTER — Ambulatory Visit
Admission: RE | Admit: 2017-05-31 | Discharge: 2017-05-31 | Disposition: A | Discharge status: Home / Self Care | Payer: Disability Insurance | Source: Ambulatory Visit | Attending: Pediatrics | Admitting: Pediatrics

## 2017-05-31 ENCOUNTER — Other Ambulatory Visit: Payer: Self-pay | Admitting: Pediatrics

## 2017-05-31 DIAGNOSIS — M549 Dorsalgia, unspecified: Secondary | ICD-10-CM

## 2017-05-31 DIAGNOSIS — M79606 Pain in leg, unspecified: Secondary | ICD-10-CM | POA: Insufficient documentation

## 2017-05-31 DIAGNOSIS — M47816 Spondylosis without myelopathy or radiculopathy, lumbar region: Secondary | ICD-10-CM | POA: Insufficient documentation

## 2017-05-31 DIAGNOSIS — M62838 Other muscle spasm: Secondary | ICD-10-CM | POA: Diagnosis present

## 2017-05-31 DIAGNOSIS — I700 Atherosclerosis of aorta: Secondary | ICD-10-CM | POA: Insufficient documentation

## 2017-05-31 DIAGNOSIS — F329 Major depressive disorder, single episode, unspecified: Secondary | ICD-10-CM | POA: Insufficient documentation

## 2017-06-03 ENCOUNTER — Ambulatory Visit: Payer: Disability Insurance | Attending: Oncology | Admitting: *Deleted

## 2017-06-03 VITALS — BP 138/88 | HR 71 | Temp 99.0°F

## 2017-06-03 DIAGNOSIS — N63 Unspecified lump in unspecified breast: Secondary | ICD-10-CM

## 2017-06-03 NOTE — Progress Notes (Signed)
  Subjective:     Patient ID: Robin Howard, female   DOB: 1960-09-25, 57 y.o.   MRN: [id number]  HPI   Review of Systems     Objective:   Physical Exam  Pulmonary/Chest: Right breast exhibits mass and tenderness. Right breast exhibits no inverted nipple, no nipple discharge and no skin change. Left breast exhibits mass and tenderness. Left breast exhibits no inverted nipple, no nipple discharge and no skin change.         Assessment:     57 year old White female presents to [facility] with complaints of bilateral breast masses.  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  Most recent mammogram on 09/10/16 was a birads 2.  Patient also states she has a new diagnosis of Thyroid cancer treated at [facility] 12/18, and back surgery October 2018.  On clinical breast exam I can palpate a tiny < 0.5 cm tender nodule at 4:00 right breast 5 cm from the nipple, a <0.5 cm nodule at 3:00 left breast at the edge of the areola, and thickened nodularity at 6:00 left breast at the site of her previous lumpectomy and mammosite.  There is no dominant mass at the site of targeted pain at 3:00 left lateral breast.  Patient has been screened for eligibility.  She does not have any insurance, Medicare or Medicaid.  She also meets financial eligibility.  Hand-out given on the Affordable Care Act.    Plan:     Will get bilateral diagnostic mammogram and ultrasound.  If no findings on imaging I will refer to Dr. Bary Castilla for further evaluation.  Patient is agreeable to the plan.  Jeanella Anton to schedule patients mammogram appointments.  Will follow-up per [facility] protocol.

## 2017-06-12 ENCOUNTER — Encounter: Payer: Self-pay | Admitting: *Deleted

## 2017-06-12 ENCOUNTER — Ambulatory Visit
Admission: RE | Admit: 2017-06-12 | Discharge: 2017-06-12 | Disposition: A | Discharge status: Home / Self Care | Payer: Disability Insurance | Source: Ambulatory Visit | Attending: Oncology | Admitting: Oncology

## 2017-06-12 DIAGNOSIS — N63 Unspecified lump in unspecified breast: Secondary | ICD-10-CM | POA: Insufficient documentation

## 2017-06-12 HISTORY — DX: Personal history of antineoplastic chemotherapy: Z92.21

## 2017-06-12 HISTORY — DX: Personal history of irradiation: Z92.3

## 2017-06-12 NOTE — Progress Notes (Signed)
Reviewed patients diagnostic mammogram results and recommendation for biopsy of the left breast as requested by the patient.  There is no mention of the right breast mass that I palpated on clinical breast exam.  I discussed findings with patient and explained that I would like for her to follow-up with Dr. Bary Castilla for further evaluation of the right breast mass and possible biopsy of the left breast mass.  She is agreeable.  Patient is scheduled to see Dr. Bary Castilla on 07/16/17 @ 3:45.

## 2017-06-13 ENCOUNTER — Telehealth: Payer: Self-pay | Admitting: General Surgery

## 2017-06-13 NOTE — Telephone Encounter (Signed)
Will review on return. Will see her at the end of the day Thursday.

## 2017-06-13 NOTE — Telephone Encounter (Signed)
Robin Howard called to make an appointment on 07-16-17 to have eval for lt br cat 3 & targeted pain in rt br. The patient wants a biopsy on the lt br. Was last seen by [name]  in 2018.We have treated her for lt br ca. Would you like to see her sooner?

## 2017-06-20 ENCOUNTER — Encounter: Payer: Self-pay | Admitting: General Surgery

## 2017-06-20 ENCOUNTER — Ambulatory Visit (INDEPENDENT_AMBULATORY_CARE_PROVIDER_SITE_OTHER): Payer: Self-pay | Admitting: General Surgery

## 2017-06-20 VITALS — BP 136/74 | HR 72 | Resp 14 | Ht 65.0 in | Wt 272.0 lb

## 2017-06-20 DIAGNOSIS — N63 Unspecified lump in unspecified breast: Secondary | ICD-10-CM

## 2017-06-20 DIAGNOSIS — C50412 Malignant neoplasm of upper-outer quadrant of left female breast: Secondary | ICD-10-CM

## 2017-06-20 DIAGNOSIS — Z171 Estrogen receptor negative status [ER-]: Secondary | ICD-10-CM

## 2017-06-20 NOTE — Progress Notes (Signed)
Patient ID: Robin Howard, female   DOB: August 13, 1960, 57 y.o.   MRN: [id number]  Chief Complaint  Patient presents with  . Pain    HPI Robin Howard is a 57 y.o. female who presents for a breast evaluation. The most recent mammogram was done on 06/12/2017. Patient does perform regular self breast checks and gets regular mammograms done. She felt a lump in the left breast about a month ago. She reports some discomfort on the left side just below her axillary area that started 3-4 months ago. She also has some discomfort on the underside of the right breast about 3-4 months ago. These areas are only tender to touch.   HPI  Past Medical History:  Diagnosis Date  . Anemia    h/o  . Breast cancer ([place]) 07/2014   T1c,N0, ER/ PR negative, Her 2 neu positive. Wide excision, SLN, Mammosite, adjuvant chemotherapy.   . Bronchitis 04-2014  . Coronary artery disease, non-occlusive    a. LHC 4/16 w/ mild nonobstructive disease affecting the LAD with tortuous vessels. CTA in 2017 showed only 30% disease in the LAD. bCarlton Adam Howard 6/18: neg for signficant ischemia, EF 54%, low risk scan  . Detached retina 1997  . Family history of adverse reaction to anesthesia    pts dad has pseudocholinestrace deficieny  . Gastritis   . GERD (gastroesophageal reflux disease)   . History of nuclear stress test    a. Howard 6/18: no significant ischemia, normal wall motion, EF 54%. Low risk scan  . HTN (hypertension)   . Hx of papillary thyroid carcinoma 02/05/2017   Dec 2018; T3aN0Mx  s/p total thyroidectomy Dec 19, 2016, s/p I-131 RAI 01/02/17  . Hypothyroidism   . Insomnia   . Last menstrual period (LMP) > 10 days ago    LMP 2014  . Major depressive disorder, recurrent ([address])   . Migraines   . Morbid obesity ([place])   . Orthostatic hypotension    a. TTE 1/19:  EF 55-60%, normal wall motion, grade 2 diastolic dysfunction, mild aortic regurgitation, mildly calcified mitral annulus, trivial pericardial effusion was  identified  . OSA (obstructive sleep apnea)    untreated due to lack of insurance  . Overactive bladder   . Pericarditis 1997  . Personal history of chemotherapy   . Personal history of radiation therapy    Mammosite  . Thyroid cancer (Robin Howard) 12/2016   [facility]  . Thyroid nodule 10/24/2016   4.7 cm, October 2018; referred for biopsy    Past Surgical History:  Procedure Laterality Date  . BACK SURGERY     L4-5  . BREAST BIOPSY Left 07/2014   [facility]  . BREAST CYST ASPIRATION Left 12/13/2014   3:00 position 2 cmfn, fat necrosis per Dr. Dwyane Luo notes  . BREAST LUMPECTOMY WITH SENTINEL LYMPH NODE BIOPSY Left 08/13/2014   Procedure: BREAST LUMPECTOMY WITH SENTINEL LYMPH NODE BIOPSY;  Surgeon: Robert Bellow, MD;  Location: [facility] ORS;  Service: General;  Laterality: Left;  . BREAST MAMMOSITE Left 08/30/2014   Procedure: MAMMOSITE BREAST;  Surgeon: Robert Bellow, MD;  Location: [facility] ORS;  Service: General;  Laterality: Left;  . CESAREAN SECTION    . COLONOSCOPY WITH PROPOFOL N/A 04/18/2016   Procedure: COLONOSCOPY WITH PROPOFOL;  Surgeon: Robert Bellow, MD;  Location: [facility] ENDOSCOPY;  Service: Endoscopy;  Laterality: N/A;  . CORONARY ANGIOPLASTY  April 2016  . EYE SURGERY Left 10/16/2015   retina repair  . FOOT SURGERY    .  FORAMINOTOMY 1 LEVEL  [id number]  . KNEE SURGERY    . LAMINECTOMY  [id number]  . PORTACATH PLACEMENT Right 08/30/2014   Procedure: INSERTION PORT-A-CATH;  Surgeon: Robert Bellow, MD;  Location: [facility] ORS;  Service: General;  Laterality: Right;  . POSTERIOR LAMINECTOMY / DECOMPRESSION LUMBAR SPINE  10/29/2016   L3-5  . RETINAL DETACHMENT SURGERY Right 1997  . RIGHT/LEFT HEART CATH AND CORONARY ANGIOGRAPHY Bilateral 02/11/2017   Procedure: RIGHT/LEFT HEART CATH AND CORONARY ANGIOGRAPHY;  Surgeon: Wellington Hampshire, MD;  Location: [place] [facility];  Service: Cardiovascular;  Laterality: Bilateral;  . TOTAL THYROIDECTOMY  12/19/2016  . TOTAL THYROIDECTOMY Bilateral  12/19/2016   [facility]    Family History  Problem Relation Age of Onset  . Alcohol abuse Mother   . Thyroid disease Mother   . Hypertension Mother   . COPD Mother   . Cancer Father        liposarcoma  . Congestive Heart Failure Father        V Tach  . Depression Father   . Hypertension Father   . Heart attack Father   . Arrhythmia Father   . Heart failure Father   . Cancer Maternal Uncle        Pancreatic cancer  . Heart disease Paternal Grandfather 57       MI  . Diabetes Other   . Cancer Sister        breast  . Breast cancer Sister 25    Social History Social History   Tobacco Use  . Smoking status: Never Smoker  . Smokeless tobacco: Never Used  Substance Use Topics  . Alcohol use: No  . Drug use: No    Allergies  Allergen Reactions  . Reglan [Metoclopramide] Shortness Of Breath and Other (See Comments)    This medication caused a dystonic reaction.    Marland Kitchen Paxil [Paroxetine Hcl] Other (See Comments)    Reaction:  Blurred vision  . Tape Rash  . Tegaderm Ag Mesh [Silver] Rash and Other (See Comments)    Only if on skin for a long time  . Ultram [Tramadol] Itching and Rash    Current Outpatient Medications  Medication Sig Dispense Refill  . acetaminophen (TYLENOL) 500 MG tablet Take 2 tablets (1,000 mg total) every 6 (six) hours as needed by mouth for mild pain or headache. Maximum dose of acetaminophen 3,000 mg total per day    . ARIPiprazole (ABILIFY) 5 MG tablet Take 5 mg by mouth daily.    Marland Kitchen aspirin EC 81 MG tablet Take 81 mg by mouth daily.    Marland Kitchen buPROPion (WELLBUTRIN SR) 150 MG 12 hr tablet Take 300 mg by mouth daily.     . cyclobenzaprine (FLEXERIL) 5 MG tablet Take 5 mg by mouth 3 (three) times daily as needed for muscle spasms.     . DULoxetine (CYMBALTA) 60 MG capsule Take 1 capsule (60 mg total) by mouth daily. 30 capsule 1  . fluticasone (FLONASE) 50 MCG/ACT nasal spray Place 2 sprays into both nostrils daily. (Patient taking differently: Place 2 sprays  into both nostrils daily as needed for allergies. ) 16 g 6  . levothyroxine (SYNTHROID, LEVOTHROID) 150 MCG tablet Take 150 mcg by mouth daily before breakfast.    . LORazepam (ATIVAN) 1 MG tablet Take 1 tablet (1 mg total) by mouth every 8 (eight) hours as needed for anxiety. 20 tablet 0  . meloxicam (MOBIC) 7.5 MG tablet Take 7.5 mg by mouth daily.    Marland Kitchen  metFORMIN (GLUCOPHAGE) 500 MG tablet Take 500 mg by mouth daily with breakfast.    . metoprolol tartrate (LOPRESSOR) 25 MG tablet Take 1 tablet (25 mg total) by mouth 2 (two) times daily. 180 tablet 2  . omeprazole (PRILOSEC) 20 MG capsule Take 1 capsule (20 mg total) by mouth daily. Caution:prolonged use may increase risk of pneumonia, colitis, osteoporosis, anemia 30 capsule 0  . simvastatin (ZOCOR) 10 MG tablet Take 1 tablet (10 mg total) by mouth daily. (Patient taking differently: Take 10 mg by mouth at bedtime. ) 90 tablet 3  . traZODone (DESYREL) 100 MG tablet Take 150 mg by mouth at bedtime.      No current facility-administered medications for this visit.     Review of Systems Review of Systems  Constitutional: Negative.   Respiratory: Negative.   Cardiovascular: Negative.     Blood pressure 136/74, pulse 72, resp. rate 14, height 5\' 5"  (1.651 m), weight 272 lb (123.4 kg), last menstrual period 08/26/2012.  Physical Exam Physical Exam  Constitutional: She is oriented to person, place, and time. She appears well-developed and well-nourished.  Eyes: Conjunctivae are normal. No scleral icterus.  Neck: Neck supple.  Cardiovascular: Normal rate, regular rhythm and normal heart sounds.  Pulmonary/Chest: Effort normal and breath sounds normal. Right breast exhibits no inverted nipple, no mass ( ), no nipple discharge, no skin change and no tenderness. Left breast exhibits mass (5 mm nodule at he edge of the areola 3o'clk). Left breast exhibits no inverted nipple, no nipple discharge, no skin change and no tenderness.     Lymphadenopathy:    She has no cervical adenopathy.    She has no axillary adenopathy.       Left: No supraclavicular adenopathy present.  Neurological: She is alert and oriented to person, place, and time.  Skin: Skin is warm and dry.  Psychiatric: She has a normal mood and affect.    Data Reviewed Jun 12, 2017 mammogram and bilateral ultrasound reviewed.  7 x 5 x 4 mm area corresponding the palpable thickening showing an echogenic periphery suggestive of fat necrosis.  BI-RADS-3.  Assessment    Stable breast exam.    Plan   The patient had been noted to have a focal area of thickening just slightly above this at the time of her last visit about 14 months ago.  No significant interval change.    I have recommended an open biopsy secondary to the superficial location to minimize risk to the skin.     HPI, Physical Exam, Assessment and Plan have been scribed under the direction and in the presence of Robert Bellow, MD  Concepcion Living, LPN  I have completed the exam and reviewed the above documentation for accuracy and completeness.  I agree with the above.  Haematologist has been used and any errors in dictation or transcription are unintentional.  Hervey Ard, M.D., F.A.C.S.  Forest Gleason Letzy Gullickson 06/21/2017, 7:51 PM

## 2017-06-20 NOTE — Patient Instructions (Signed)
To have left breast excisional biopsy scheduled in office

## 2017-06-21 DIAGNOSIS — N63 Unspecified lump in unspecified breast: Secondary | ICD-10-CM | POA: Insufficient documentation

## 2017-06-25 ENCOUNTER — Encounter: Payer: Self-pay | Admitting: General Surgery

## 2017-06-25 ENCOUNTER — Other Ambulatory Visit: Payer: Self-pay | Admitting: General Surgery

## 2017-06-25 ENCOUNTER — Ambulatory Visit (INDEPENDENT_AMBULATORY_CARE_PROVIDER_SITE_OTHER): Payer: Self-pay | Admitting: General Surgery

## 2017-06-25 ENCOUNTER — Ambulatory Visit: Payer: Self-pay

## 2017-06-25 VITALS — BP 112/70 | HR 76 | Resp 18 | Ht 65.0 in | Wt 249.0 lb

## 2017-06-25 DIAGNOSIS — N6321 Unspecified lump in the left breast, upper outer quadrant: Secondary | ICD-10-CM

## 2017-06-25 NOTE — Patient Instructions (Addendum)

## 2017-06-25 NOTE — Progress Notes (Signed)
Patient ID: Robin Howard, female   DOB: 12-Jul-1960, 57 y.o.   MRN: [id number]  Chief Complaint  Patient presents with  . Procedure    HPI [name] is a 57 y.o. female.  Here for excisional left breast biopsy.  HPI  Past Medical History:  Diagnosis Date  . Anemia    h/o  . Breast cancer ([place]) 07/2014   T1c,N0, ER/ PR negative, Her 2 neu positive. Wide excision, SLN, Mammosite, adjuvant chemotherapy.   . Bronchitis 04-2014  . Coronary artery disease, non-occlusive    a. LHC 4/16 w/ mild nonobstructive disease affecting the LAD with tortuous vessels. CTA in 2017 showed only 30% disease in the LAD. bCarlton Adam MV 6/18: neg for signficant ischemia, EF 54%, low risk scan  . Detached retina 1997  . Family history of adverse reaction to anesthesia    pts dad has pseudocholinestrace deficieny  . Gastritis   . GERD (gastroesophageal reflux disease)   . History of nuclear stress test    a. MV 6/18: no significant ischemia, normal wall motion, EF 54%. Low risk scan  . HTN (hypertension)   . Hx of papillary thyroid carcinoma 02/05/2017   Dec 2018; T3aN0Mx  s/p total thyroidectomy Dec 19, 2016, s/p I-131 RAI 01/02/17  . Hypothyroidism   . Insomnia   . Last menstrual period (LMP) > 10 days ago    LMP 2014  . Major depressive disorder, recurrent ([place])   . Migraines   . Morbid obesity ([place])   . Orthostatic hypotension    a. TTE 1/19:  EF 55-60%, normal wall motion, grade 2 diastolic dysfunction, mild aortic regurgitation, mildly calcified mitral annulus, trivial pericardial effusion was identified  . OSA (obstructive sleep apnea)    untreated due to lack of insurance  . Overactive bladder   . Pericarditis 1997  . Personal history of chemotherapy   . Personal history of radiation therapy    Mammosite  . Thyroid cancer ([place]) 12/2016   [facility]  . Thyroid nodule 10/24/2016   4.7 cm, October 2018; referred for biopsy    Past Surgical History:  Procedure Laterality Date  . BACK SURGERY      L4-5  . BREAST BIOPSY Left 07/2014   [facility]  . BREAST CYST ASPIRATION Left 12/13/2014   3:00 position 2 cmfn, fat necrosis per Dr. Dwyane Luo notes  . BREAST LUMPECTOMY WITH SENTINEL LYMPH NODE BIOPSY Left 08/13/2014   Procedure: BREAST LUMPECTOMY WITH SENTINEL LYMPH NODE BIOPSY;  Surgeon: Robert Bellow, MD;  Location: [facility] ORS;  Service: General;  Laterality: Left;  . BREAST MAMMOSITE Left 08/30/2014   Procedure: MAMMOSITE BREAST;  Surgeon: Robert Bellow, MD;  Location: [facility] ORS;  Service: General;  Laterality: Left;  . CESAREAN SECTION    . COLONOSCOPY WITH PROPOFOL N/A 04/18/2016   Procedure: COLONOSCOPY WITH PROPOFOL;  Surgeon: Robert Bellow, MD;  Location: [facility] ENDOSCOPY;  Service: Endoscopy;  Laterality: N/A;  . CORONARY ANGIOPLASTY  April 2016  . EYE SURGERY Left 10/16/2015   retina repair  . FOOT SURGERY    . FORAMINOTOMY 1 LEVEL  [id number]  . KNEE SURGERY    . LAMINECTOMY  [id number]  . PORTACATH PLACEMENT Right 08/30/2014   Procedure: INSERTION PORT-A-CATH;  Surgeon: Robert Bellow, MD;  Location: [facility] ORS;  Service: General;  Laterality: Right;  . POSTERIOR LAMINECTOMY / DECOMPRESSION LUMBAR SPINE  10/29/2016   L3-5  . RETINAL DETACHMENT SURGERY Right 1997  . RIGHT/LEFT HEART CATH AND CORONARY ANGIOGRAPHY  Bilateral 02/11/2017   Procedure: RIGHT/LEFT HEART CATH AND CORONARY ANGIOGRAPHY;  Surgeon: Wellington Hampshire, MD;  Location: [place] [facility];  Service: Cardiovascular;  Laterality: Bilateral;  . TOTAL THYROIDECTOMY  12/19/2016  . TOTAL THYROIDECTOMY Bilateral 12/19/2016   [facility]    Family History  Problem Relation Age of Onset  . Alcohol abuse Mother   . Thyroid disease Mother   . Hypertension Mother   . COPD Mother   . Cancer Father        liposarcoma  . Congestive Heart Failure Father        V Tach  . Depression Father   . Hypertension Father   . Heart attack Father   . Arrhythmia Father   . Heart failure Father   . Cancer Maternal Uncle         Pancreatic cancer  . Heart disease Paternal Grandfather 75       MI  . Diabetes Other   . Cancer Sister        breast  . Breast cancer Sister 79    Social History Social History   Tobacco Use  . Smoking status: Never Smoker  . Smokeless tobacco: Never Used  Substance Use Topics  . Alcohol use: No  . Drug use: No    Allergies  Allergen Reactions  . Reglan [Metoclopramide] Shortness Of Breath and Other (See Comments)    This medication caused a dystonic reaction.    Marland Kitchen Paxil [Paroxetine Hcl] Other (See Comments)    Reaction:  Blurred vision  . Tape Rash  . Tegaderm Ag Mesh [Silver] Rash and Other (See Comments)    Only if on skin for a long time  . Ultram [Tramadol] Itching and Rash    Current Outpatient Medications  Medication Sig Dispense Refill  . acetaminophen (TYLENOL) 500 MG tablet Take 2 tablets (1,000 mg total) every 6 (six) hours as needed by mouth for mild pain or headache. Maximum dose of acetaminophen 3,000 mg total per day    . ARIPiprazole (ABILIFY) 5 MG tablet Take 5 mg by mouth daily.    Marland Kitchen aspirin EC 81 MG tablet Take 81 mg by mouth daily.    Marland Kitchen buPROPion (WELLBUTRIN SR) 150 MG 12 hr tablet Take 300 mg by mouth daily.     . cyclobenzaprine (FLEXERIL) 5 MG tablet Take 5 mg by mouth 3 (three) times daily as needed for muscle spasms.     . DULoxetine (CYMBALTA) 60 MG capsule Take 1 capsule (60 mg total) by mouth daily. 30 capsule 1  . fluticasone (FLONASE) 50 MCG/ACT nasal spray Place 2 sprays into both nostrils daily. (Patient taking differently: Place 2 sprays into both nostrils daily as needed for allergies. ) 16 g 6  . levothyroxine (SYNTHROID, LEVOTHROID) 150 MCG tablet Take 150 mcg by mouth daily before breakfast.    . LORazepam (ATIVAN) 1 MG tablet Take 1 tablet (1 mg total) by mouth every 8 (eight) hours as needed for anxiety. 20 tablet 0  . meloxicam (MOBIC) 7.5 MG tablet Take 7.5 mg by mouth daily.    . metFORMIN (GLUCOPHAGE) 500 MG tablet Take 500 mg by  mouth daily with breakfast.    . metoprolol tartrate (LOPRESSOR) 25 MG tablet Take 1 tablet (25 mg total) by mouth 2 (two) times daily. 180 tablet 2  . omeprazole (PRILOSEC) 20 MG capsule Take 1 capsule (20 mg total) by mouth daily. Caution:prolonged use may increase risk of pneumonia, colitis, osteoporosis, anemia 30 capsule 0  .  simvastatin (ZOCOR) 10 MG tablet Take 1 tablet (10 mg total) by mouth daily. (Patient taking differently: Take 10 mg by mouth at bedtime. ) 90 tablet 3  . traZODone (DESYREL) 100 MG tablet Take 150 mg by mouth at bedtime.      No current facility-administered medications for this visit.     Review of Systems Review of Systems  Constitutional: Negative.   Respiratory: Negative.   Cardiovascular: Negative.   Neurological: Positive for headaches.    Blood pressure 112/70, pulse 76, resp. rate 18, height 5\' 5"  (1.651 m), weight 249 lb (112.9 kg), last menstrual period 08/26/2012.  Physical Exam Physical Exam  Constitutional: She is oriented to person, place, and time. She appears well-developed and well-nourished.  Pulmonary/Chest:    Neurological: She is alert and oriented to person, place, and time.  Skin: Skin is warm and dry.    Data Reviewed 10 cc of 0.5% Xylocaine with 0.25% Marcaine with 1 to 200,000 units of epinephrine was utilized and well-tolerated.  ChloraPrep was applied to the skin.  A small radial incision was made across the edge of the areola.  The focal area of thickened tissue corresponding to the palpable finding was excised in 2 segments and sent in formalin for routine histology.  The wound was closed with interrupted 4-0 Vicryl subcuticular sutures.  Benzoin, Steri-Strip followed by Telfa and Tegaderm dressing applied.  The procedure was well-tolerated.  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    Plan    The patient will be contacted when pathology is available.  Instructed to use ice for  comfort.  Braw for support.  She does not need to come for a postop visit if she is not having any issues.     HPI, Physical Exam, Assessment and Plan have been scribed under the direction and in the presence of Robert Bellow, MD. Karie Fetch, RN  I have completed the exam and reviewed the above documentation for accuracy and completeness.  I agree with the above.  Haematologist has been used and any errors in dictation or transcription are unintentional.  Hervey Ard, M.D., F.A.C.S.  Forest Gleason Treana Lacour 06/26/2017, 5:53 PM

## 2017-06-27 LAB — PATHOLOGY

## 2017-06-28 ENCOUNTER — Telehealth: Payer: Self-pay | Admitting: *Deleted

## 2017-06-28 NOTE — Telephone Encounter (Signed)
Left message for patient to call back for results.  

## 2017-06-28 NOTE — Telephone Encounter (Signed)
-----   Message from Robert Bellow, MD sent at 06/28/2017 10:29 AM EDT ----- Please let the patient know the pathology was fine. Just damaged fatty tissue.  ----- Message ----- From: Interface, [facility] Lab Results In Sent: 06/27/2017   6:35 PM To: Robert Bellow, MD

## 2017-06-28 NOTE — Telephone Encounter (Signed)
Notified patient as instructed, patient agrees.  

## 2017-07-02 ENCOUNTER — Other Ambulatory Visit: Payer: Self-pay | Admitting: Family Medicine

## 2017-07-16 ENCOUNTER — Ambulatory Visit: Payer: Self-pay | Admitting: General Surgery

## 2017-07-28 NOTE — Progress Notes (Signed)
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  She currently feels well and is asymptomatic. She is fully recovered from her thyroidectomy and radioactive iodine ablation after being found to have thyroid cancer approximately 6 months ago.  She has no neurologic complaints.  She denies any recent fevers or illnesses.  She has a good appetite and denies weight loss.  She has no chest pain or shortness of breath. She denies any nausea, vomiting, constipation, or diarrhea. She has no urinary complaints.  Patient feels that her baseline offers no specific complaints today.  REVIEW OF SYSTEMS:   Review of Systems  Constitutional: Negative.  Negative for fever, malaise/fatigue and weight loss.  HENT: Negative.  Negative for congestion and sore throat.   Eyes: Negative for blurred vision and pain.  Respiratory: Negative.  Negative for cough and shortness of breath.   Cardiovascular: Negative.  Negative for chest pain and leg swelling.  Gastrointestinal: Negative.  Negative for abdominal pain, diarrhea and nausea.  Genitourinary: Negative.   Musculoskeletal: Negative.  Negative for back pain and joint pain.  Skin: Negative.  Negative for rash.  Neurological: Negative.  Negative for  sensory change, focal weakness, weakness and headaches.  Psychiatric/Behavioral: Negative.  Negative for depression. The patient is not nervous/anxious.     As per HPI. Otherwise, a complete review of systems is negative.  PAST MEDICAL HISTORY: Past Medical History:  Diagnosis Date  . Anemia    h/o  . Breast cancer ([place]) 07/2014   T1c,N0, ER/ PR negative, Her 2 neu positive. Wide excision, SLN, Mammosite, adjuvant chemotherapy.   . Bronchitis 04-2014  . Coronary artery disease, non-occlusive    a. LHC 4/16 w/ mild nonobstructive disease affecting the LAD with tortuous vessels. CTA in 2017 showed only 30% disease in the LAD. bCarlton Adam MV 6/18: neg for signficant ischemia, EF 54%, low risk scan  . Detached retina 1997  . Family history of adverse reaction to anesthesia    pts dad has pseudocholinestrace deficieny  . Gastritis   . GERD (gastroesophageal reflux disease)   . History of nuclear stress test    a. MV 6/18: no significant ischemia, normal wall motion, EF 54%. Low risk scan  . HTN (hypertension)   . Hx of papillary thyroid carcinoma 02/05/2017   Dec 2018; T3aN0Mx  s/p total thyroidectomy Dec 19, 2016, s/p I-131 RAI 01/02/17  . Hypothyroidism   . Insomnia   . Last menstrual period (LMP) > 10 days ago    LMP 2014  . Major depressive disorder, recurrent ([place])   . Migraines   . Morbid obesity ([place])   . Orthostatic hypotension    a. TTE 1/19:  EF 55-60%, normal wall motion, grade 2 diastolic dysfunction, mild aortic regurgitation, mildly calcified mitral annulus, trivial pericardial effusion was identified  . OSA (obstructive sleep apnea)    untreated due  to lack of insurance  . Overactive bladder   . Pericarditis 1997  . Personal history of chemotherapy   . Personal history of radiation therapy    Mammosite  . Thyroid cancer ([place]) 12/2016   [facility]  . Thyroid nodule 10/24/2016   4.7 cm, October 2018; referred for biopsy    PAST SURGICAL HISTORY: Past Surgical History:    Procedure Laterality Date  . BACK SURGERY     L4-5  . BREAST BIOPSY Left 07/2014   [facility]  . BREAST CYST ASPIRATION Left 12/13/2014   3:00 position 2 cmfn, fat necrosis per Dr. Dwyane Luo notes  . BREAST LUMPECTOMY WITH SENTINEL LYMPH NODE BIOPSY Left 08/13/2014   Procedure: BREAST LUMPECTOMY WITH SENTINEL LYMPH NODE BIOPSY;  Surgeon: Robert Bellow, MD;  Location: [facility] ORS;  Service: General;  Laterality: Left;  . BREAST MAMMOSITE Left 08/30/2014   Procedure: MAMMOSITE BREAST;  Surgeon: Robert Bellow, MD;  Location: [facility] ORS;  Service: General;  Laterality: Left;  . CESAREAN SECTION    . COLONOSCOPY WITH PROPOFOL N/A 04/18/2016   Procedure: COLONOSCOPY WITH PROPOFOL;  Surgeon: Robert Bellow, MD;  [facility] END[facility];  Service: Endoscopy;  Laterality: N/A;  . CORONARY ANGIOPLASTY  April 2016  . EYE SURGERY Left 10/16/2015   retina repair  . FOOT SURGERY    . FORAMINOTOMY 1 LEVEL  [id number]  . KNEE SURGERY    . LAMINECTOMY  [id number]  . PORTACATH PLACEMENT Right 08/30/2014   Procedure: INSERTION PORT-A-CATH;  Surgeon: Robert Bellow, MD;  Location: [facility] ORS;  Service: General;  Laterality: Right;  . POSTERIOR LAMINECTOMY / DECOMPRESSION LUMBAR SPINE  10/29/2016   L3-5  . RETINAL DETACHMENT SURGERY Right 1997  . RIGHT/LEFT HEART CATH AND CORONARY ANGIOGRAPHY Bilateral 02/11/2017   Procedure: RIGHT/LEFT HEART CATH AND CORONARY ANGIOGRAPHY;  Surgeon: Wellington Hampshire, MD;  Location: [facility];  Service: Cardiovascular;  Laterality: Bilateral;  . TOTAL THYROIDECTOMY  12/19/2016  . TOTAL THYROIDECTOMY Bilateral 12/19/2016   [facility]    FAMILY HISTORY Family History  Problem Relation Age of Onset  . Alcohol abuse Mother   . Thyroid disease Mother   . Hypertension Mother   . COPD Mother   . Cancer Father        liposarcoma  . Congestive Heart Failure Father        V Tach  . Depression Father   . Hypertension Father   . Heart attack Father   . Arrhythmia Father    . Heart failure Father   . Cancer Maternal Uncle        Pancreatic cancer  . Heart disease Paternal Grandfather 66       MI  . Diabetes Other   . Cancer Sister        breast  . Breast cancer Sister 91       ADVANCED DIRECTIVES:    HEALTH MAINTENANCE: Social History   Tobacco Use  . Smoking status: Never Smoker  . Smokeless tobacco: Never Used  Substance Use Topics  . Alcohol use: No  . Drug use: No    Allergies  Allergen Reactions  . Reglan [Metoclopramide] Shortness Of Breath and Other (See Comments)    This medication caused a dystonic reaction.    Marland Kitchen Paxil [Paroxetine Hcl] Other (See Comments)    Reaction:  Blurred vision  . Tape Rash  . Tegaderm Ag Mesh [Silver] Rash and Other (See Comments)    Only if on skin for a long  time  . Ultram [Tramadol] Itching and Rash    Current Outpatient Medications  Medication Sig Dispense Refill  . acetaminophen (TYLENOL) 500 MG tablet Take 2 tablets (1,000 mg total) every 6 (six) hours as needed by mouth for mild pain or headache. Maximum dose of acetaminophen 3,000 mg total per day    . ARIPiprazole (ABILIFY) 5 MG tablet Take 5 mg by mouth daily.    Marland Kitchen aspirin EC 81 MG tablet Take 81 mg by mouth daily.    Marland Kitchen buPROPion (WELLBUTRIN SR) 150 MG 12 hr tablet Take 300 mg by mouth daily.     . cyclobenzaprine (FLEXERIL) 5 MG tablet Take 5 mg by mouth 3 (three) times daily as needed for muscle spasms.     . DULoxetine (CYMBALTA) 60 MG capsule Take 1 capsule (60 mg total) by mouth daily. 30 capsule 1  . fluticasone (FLONASE) 50 MCG/ACT nasal spray Place 2 sprays into both nostrils daily. (Patient taking differently: Place 2 sprays into both nostrils daily as needed for allergies. ) 16 g 6  . levothyroxine (SYNTHROID, LEVOTHROID) 150 MCG tablet Take 150 mcg by mouth daily before breakfast.    . LORazepam (ATIVAN) 1 MG tablet Take 1 tablet (1 mg total) by mouth every 8 (eight) hours as needed for anxiety. 20 tablet 0  . meloxicam (MOBIC) 7.5  MG tablet Take 7.5 mg by mouth daily.    . metFORMIN (GLUCOPHAGE) 500 MG tablet Take 500 mg by mouth daily with breakfast.    . metoprolol tartrate (LOPRESSOR) 25 MG tablet Take 1 tablet (25 mg total) by mouth 2 (two) times daily. 180 tablet 2  . omeprazole (PRILOSEC) 20 MG capsule TAKE ONE CAPSULE BY MOUTH DAILY *CAUTION: PROLONGED USE MAY INCREASE RISK OF PNEUMONIA, COLITIS, OSTEOPOROSIS* 30 capsule 0  . simvastatin (ZOCOR) 10 MG tablet Take 1 tablet (10 mg total) by mouth daily. (Patient taking differently: Take 10 mg by mouth at bedtime. ) 90 tablet 3  . traZODone (DESYREL) 100 MG tablet Take 150 mg by mouth at bedtime.      No current facility-administered medications for this visit.     OBJECTIVE: Vitals:   07/30/17 1505  BP: 126/87  Pulse: 75  Resp: 18  Temp: 97.9 F (36.6 C)     Body mass index is 41.29 kg/m.    ECOG FS:0 - Asymptomatic  General: Well-developed, well-nourished, no acute distress. Eyes: Pink conjunctiva, anicteric sclera. HEENT: Normocephalic, moist mucous membranes. Breast: Bilateral breast and axilla without lumps or masses. Lungs: Clear to auscultation bilaterally. Heart: Regular rate and rhythm. No rubs, murmurs, or gallops. Abdomen: Soft, nontender, nondistended. No organomegaly noted, normoactive bowel sounds. Musculoskeletal: No edema, cyanosis, or clubbing. Neuro: Alert, answering all questions appropriately. Cranial nerves grossly intact. Skin: No rashes or petechiae noted. Psych: Normal affect.   LAB RESULTS:  Lab Results  Component Value Date   NA 139 02/07/2017   K 3.8 02/07/2017   CL 103 02/07/2017   CO2 29 02/07/2017   GLUCOSE 103 (H) 02/07/2017   BUN 11 02/07/2017   CREATININE 1.21 (H) 02/07/2017   CALCIUM 9.0 02/07/2017   PROT 6.6 01/22/2017   ALBUMIN 4.0 01/22/2017   AST 40 01/22/2017   ALT 25 01/22/2017   ALKPHOS 89 01/22/2017   BILITOT 0.3 01/22/2017   GFRNONAA 49 (L) 02/07/2017   GFRAA 57 (L) 02/07/2017    Lab Results   Component Value Date   WBC 5.5 02/07/2017   NEUTROABS 3.7 01/22/2017   HGB 11.5 (  L) 02/07/2017   HCT 35.1 02/07/2017   MCV 89.9 02/07/2017   PLT 224 02/07/2017     STUDIES: No results found.  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  PLAN:    1. 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  She received adjuvant chemotherapy with Taxotere, carboplatin, and Herceptin, but treatment was discontinued after 3 cycles on November 02, 2014 secondary to persistent side effects.  She completed her year-long maintenance Herceptin on October 04, 2015.  Patient had a mammogram on Jun 12, 2017 that was reported as BI-RADS 3.  She subsequently underwent ultrasound and biopsy that did not reveal any new or recurrence of malignancy.  Repeat mammogram in 6 months.  Return to clinic in 6 months for routine evaluation. 2. Thyroid cancer: Patient completed thyroidectomy and radioactive iodine ablation in the spring 2019.  Continue current dose of Synthroid.  Follow-up with [facility] endocrinology as scheduled.  Patient expressed understanding and was in agreement with this plan. She also understands that She can call clinic at any time with any questions, concerns, or complaints.   Breast cancer, female   Staging form: Breast, AJCC 7th Edition     Pathologic stage from 08/17/2014: Stage IA (T1c, N0, cM0) - Signed by Lloyd Huger, MD on 08/17/2014  Lloyd Huger, MD   08/04/2017 9:05 AM

## 2017-07-30 ENCOUNTER — Encounter: Payer: Self-pay | Admitting: Oncology

## 2017-07-30 ENCOUNTER — Inpatient Hospital Stay: Payer: Medicaid Other | Attending: Oncology

## 2017-07-30 ENCOUNTER — Inpatient Hospital Stay (HOSPITAL_BASED_OUTPATIENT_CLINIC_OR_DEPARTMENT_OTHER): Payer: Medicaid Other | Admitting: Oncology

## 2017-07-30 VITALS — BP 126/87 | HR 75 | Temp 97.9°F | Resp 18 | Wt 248.1 lb

## 2017-07-30 DIAGNOSIS — G4730 Sleep apnea, unspecified: Secondary | ICD-10-CM | POA: Insufficient documentation

## 2017-07-30 DIAGNOSIS — N3281 Overactive bladder: Secondary | ICD-10-CM

## 2017-07-30 DIAGNOSIS — I10 Essential (primary) hypertension: Secondary | ICD-10-CM | POA: Insufficient documentation

## 2017-07-30 DIAGNOSIS — Z803 Family history of malignant neoplasm of breast: Secondary | ICD-10-CM

## 2017-07-30 DIAGNOSIS — C73 Malignant neoplasm of thyroid gland: Secondary | ICD-10-CM

## 2017-07-30 DIAGNOSIS — G4700 Insomnia, unspecified: Secondary | ICD-10-CM | POA: Insufficient documentation

## 2017-07-30 DIAGNOSIS — C50512 Malignant neoplasm of lower-outer quadrant of left female breast: Secondary | ICD-10-CM

## 2017-07-30 DIAGNOSIS — F329 Major depressive disorder, single episode, unspecified: Secondary | ICD-10-CM

## 2017-07-30 DIAGNOSIS — Z79899 Other long term (current) drug therapy: Secondary | ICD-10-CM | POA: Insufficient documentation

## 2017-07-30 DIAGNOSIS — Z9221 Personal history of antineoplastic chemotherapy: Secondary | ICD-10-CM | POA: Insufficient documentation

## 2017-07-30 DIAGNOSIS — E039 Hypothyroidism, unspecified: Secondary | ICD-10-CM

## 2017-07-30 DIAGNOSIS — E669 Obesity, unspecified: Secondary | ICD-10-CM

## 2017-07-30 DIAGNOSIS — D649 Anemia, unspecified: Secondary | ICD-10-CM

## 2017-07-30 DIAGNOSIS — I2510 Atherosclerotic heart disease of native coronary artery without angina pectoris: Secondary | ICD-10-CM

## 2017-07-30 DIAGNOSIS — Z171 Estrogen receptor negative status [ER-]: Secondary | ICD-10-CM | POA: Insufficient documentation

## 2017-07-30 DIAGNOSIS — Z170 Estrogen receptor positive status [ER+]: Secondary | ICD-10-CM

## 2017-07-30 DIAGNOSIS — Z800 Family history of malignant neoplasm of digestive organs: Secondary | ICD-10-CM | POA: Insufficient documentation

## 2017-07-30 DIAGNOSIS — Z7984 Long term (current) use of oral hypoglycemic drugs: Secondary | ICD-10-CM | POA: Insufficient documentation

## 2017-07-30 DIAGNOSIS — Z923 Personal history of irradiation: Secondary | ICD-10-CM | POA: Insufficient documentation

## 2017-07-30 DIAGNOSIS — K219 Gastro-esophageal reflux disease without esophagitis: Secondary | ICD-10-CM | POA: Insufficient documentation

## 2017-07-30 NOTE — Progress Notes (Signed)
Patient denies any concerns today.  

## 2017-07-31 LAB — CANCER ANTIGEN 27.29: CAN 27.29: 13.9 U/mL (ref 0.0–38.6)

## 2017-08-01 ENCOUNTER — Other Ambulatory Visit: Payer: Self-pay

## 2017-08-01 ENCOUNTER — Ambulatory Visit: Payer: Self-pay | Admitting: Oncology

## 2017-08-06 ENCOUNTER — Encounter: Payer: Self-pay | Admitting: *Deleted

## 2017-08-06 NOTE — Progress Notes (Signed)
Patient with benign findings on biopsy.  To follow-up in one year with annual screening.

## 2017-08-12 ENCOUNTER — Other Ambulatory Visit: Payer: Self-pay | Admitting: Family Medicine

## 2017-08-24 NOTE — Progress Notes (Signed)
Cardiology Office Note Date:  08/26/2017  Patient ID:  Robin, Howard 09/01/1960, MRN [id number] PCP:  Arnetha Courser, MD  Cardiologist:  Dr. Fletcher Anon, MD    Chief Complaint: Follow up  History of Present Illness: Robin Howard is a 57 y.o. female with history of mild nonobstructive CAD by cardiac cath in 04/2014 and on 02/11/2017 as detailed below with normal EF, breast cancer in 07/2015 s/p lumpectomy followed by chemotherapy, completing Herceptin in the fall of 2017. Also with history of HTN, HLD, chronic back pain s/p lumbar decompression and laminectomy in 10/2016, papillary thyroid carcinoma s/p thyroidectomy 12/19/2016 s/p radio-active iodine therapy, hypothyroidism, and obesity with mixed sleep apnea who presents for routine follow up of her nonobstructive CAD.  She was previously admitted to the hospital and 04/2014 with chest pain found to have mild troponin elevation with peak of 0.50. Cardiac cath done at that time by outside group apparently showed nonobstructive CAD (report not available for review). She again presented to the hospital with recurrence of chest pain in 01/2015 and was found to have elevated troponin with peak of 1.02 and started on heparin drip. She was consulted on by her cardiologist at that time who recommended discontinuation of heparin drip, patient discharge, and outpatient nuclear stress testing. Per patient report she followed up with the stress test though we do not have results for review. She also stated she had what sounds to be a coronary artery CT with a self-reported "30% blockage." She was seen by our group in 06/2016, and was doing well at that time. She did note occasional intermittent sharp substernal chest pain that was not associated with exertion or rest. She underwent Lexiscan Myoview on 06/27/16 that showed no significant ischemia, normal wall motion, EF 54%. Low risk scan. She underwent successful lumbar laminectomy in 10/2016 with continued lumbar and hip  pain with increase in pain medication. In this setting, she reported 2 syncopal episodes felt to be related to labile BP with increase in pain medication. In follow up on 01/22/2017, she was somewhat overwhelmed and anxious along with complaints of intermittent sharp chest pain and dizziness. Echo performed on 02/05/17 showed an EF of 55-60%, mild LVH, not technically sufficient to allow for LV diastolic function, RV cavity size normal with normal systolic function. She was evaluated in the office on 1/24 for follow up and noted continued SOB with exertion along with right-sided chest pain. She was mildly tachycardic with normal pulse ox. Because of her SOB, chest pain, recent cancer diagnosis and sinus tachycardia noted during her office visit we ordered a D-dimer that was elevated. She proceeded to the ED for further evaluation on 1/24 and underwent CTA chest that was negative for PE. She underwent R/LHC on 02/11/2017 that showed minor luminal irregularities without evidence of obstructive CAD, normal LV systolic function with an EF of 55-65%, normal LVEDP. Details include proximal LAD 15% stenosis, otherwise normal coronary arteries. RHC showed normal filling pressures, normal pulmonary pressure and normal cardiac output. It was felt her SOB and chest pain were not cardiac in orgin. She was last seen in the office in 02/2017 and was doing well from a cardiac perspective.   She comes in under increased stress today. She has been evicted from her house and has to be out by 08/30/2017. She continues to note intermittent, split-second chest pain that is not associated with exertion or rest. Pain is unchanged from her prior episodes in which she was noted to  have nonobstructive coronaries as above. She feels like this may be related to reflux. Her SOB is about the same. She is seeing a dietitian in [place] with her weight being down 11 pounds from when she was last seen in our office.   Past Medical History:    Diagnosis Date  . Anemia    h/o  . Breast cancer ([place]) 07/2014   T1c,N0, ER/ PR negative, Her 2 neu positive. Wide excision, SLN, Mammosite, adjuvant chemotherapy.   . Bronchitis 04-2014  . Coronary artery disease, non-occlusive    a. LHC 4/16 w/ mild nonobstructive disease affecting the LAD with tortuous vessels. CTA in 2017 showed only 30% disease in the LAD. bCarlton Adam MV 6/18: neg for signficant ischemia, EF 54%, low risk scan  . Detached retina 1997  . Family history of adverse reaction to anesthesia    pts dad has pseudocholinestrace deficieny  . Gastritis   . GERD (gastroesophageal reflux disease)   . History of nuclear stress test    a. MV 6/18: no significant ischemia, normal wall motion, EF 54%. Low risk scan  . HTN (hypertension)   . Hx of papillary thyroid carcinoma 02/05/2017   Dec 2018; T3aN0Mx  s/p total thyroidectomy Dec 19, 2016, s/p I-131 RAI 01/02/17  . Hypothyroidism   . Insomnia   . Last menstrual period (LMP) > 10 days ago    LMP 2014  . Major depressive disorder, recurrent ([place])   . Migraines   . Morbid obesity ([place])   . Orthostatic hypotension    a. TTE 1/19:  EF 55-60%, normal wall motion, grade 2 diastolic dysfunction, mild aortic regurgitation, mildly calcified mitral annulus, trivial pericardial effusion was identified  . OSA (obstructive sleep apnea)    untreated due to lack of insurance  . Overactive bladder   . Pericarditis 1997  . Personal history of chemotherapy   . Personal history of radiation therapy    Mammosite  . Thyroid cancer ([place]) 12/2016   [facility]  . Thyroid nodule 10/24/2016   4.7 cm, October 2018; referred for biopsy    Past Surgical History:  Procedure Laterality Date  . BACK SURGERY     L4-5  . BREAST BIOPSY Left 07/2014   [facility]  . BREAST CYST ASPIRATION Left 12/13/2014   3:00 position 2 cmfn, fat necrosis per Dr. Dwyane Luo notes  . BREAST LUMPECTOMY WITH SENTINEL LYMPH NODE BIOPSY Left 08/13/2014   Procedure: BREAST LUMPECTOMY  WITH SENTINEL LYMPH NODE BIOPSY;  Surgeon: Robert Bellow, MD;  Location: [facility] ORS;  Service: General;  Laterality: Left;  . BREAST MAMMOSITE Left 08/30/2014   Procedure: MAMMOSITE BREAST;  Surgeon: Robert Bellow, MD;  Location: [facility] ORS;  Service: General;  Laterality: Left;  . CESAREAN SECTION    . COLONOSCOPY WITH PROPOFOL N/A 04/18/2016   Procedure: COLONOSCOPY WITH PROPOFOL;  Surgeon: Robert Bellow, MD;  Location[facility]COPY;  Service: Endoscopy;  Laterality: N/A;  . CORONARY ANGIOPLASTY  April 2016  . EYE SURGERY Left 10/16/2015   retina repair  . FOOT SURGERY    . FORAMINOTOMY 1 LEVEL  [id number]  . KNEE SURGERY    . LAMINECTOMY  [id number]  . PORTACATH PLACEMENT Right 08/30/2014   Procedure: INSERTION PORT-A-CATH;  Surgeon: Robert Bellow, MD;  Location: [facility] ORS;  Service: General;  Laterality: Right;  . POSTERIOR LAMINECTOMY / DECOMPRESSION LUMBAR SPINE  10/29/2016   L3-5  . RETINAL DETACHMENT SURGERY Right 1997  . RIGHT/LEFT HEART CATH AND CORONARY ANGIOGRAPHY  Bilateral 02/11/2017   Procedure: RIGHT/LEFT HEART CATH AND CORONARY ANGIOGRAPHY;  Surgeon: Wellington Hampshire, MD;  Location: [facility];  Service: Cardiovascular;  Laterality: Bilateral;  . TOTAL THYROIDECTOMY  12/19/2016  . TOTAL THYROIDECTOMY Bilateral 12/19/2016   [facility]    Current Meds  Medication Sig  . acetaminophen (TYLENOL) 500 MG tablet Take 2 tablets (1,000 mg total) every 6 (six) hours as needed by mouth for mild pain or headache. Maximum dose of acetaminophen 3,000 mg total per day  . ARIPiprazole (ABILIFY) 5 MG tablet Take 5 mg by mouth daily.  Marland Kitchen aspirin EC 81 MG tablet Take 81 mg by mouth daily.  Marland Kitchen buPROPion (WELLBUTRIN SR) 150 MG 12 hr tablet Take 300 mg by mouth daily.   . cyclobenzaprine (FLEXERIL) 5 MG tablet Take 5 mg by mouth 3 (three) times daily as needed for muscle spasms.   . DULoxetine (CYMBALTA) 60 MG capsule Take 1 capsule (60 mg total) by mouth daily.  . fluticasone  (FLONASE) 50 MCG/ACT nasal spray Place 2 sprays into both nostrils daily. (Patient taking differently: Place 2 sprays into both nostrils daily as needed for allergies. )  . LAMOTRIGINE PO Take 10 mg by mouth daily.  Marland Kitchen levothyroxine (SYNTHROID, LEVOTHROID) 150 MCG tablet Take 150 mcg by mouth daily before breakfast.  . LORazepam (ATIVAN) 1 MG tablet Take 1 tablet (1 mg total) by mouth every 8 (eight) hours as needed for anxiety.  . meloxicam (MOBIC) 7.5 MG tablet Take 7.5 mg by mouth daily.  . metFORMIN (GLUCOPHAGE) 500 MG tablet Take 500 mg by mouth daily with breakfast.  . metoprolol tartrate (LOPRESSOR) 25 MG tablet Take 1 tablet (25 mg total) by mouth 2 (two) times daily.  Marland Kitchen omeprazole (PRILOSEC) 20 MG capsule TAKE ONE CAPSULE BY MOUTH DAILY  CAUTION: PROLONGED USE MAY INCREASE RISK OF PNEUMONIA, COLITIS, OSTEOPOROSIS  . simvastatin (ZOCOR) 10 MG tablet Take 1 tablet (10 mg total) by mouth daily. (Patient taking differently: Take 10 mg by mouth at bedtime. )  . traZODone (DESYREL) 100 MG tablet Take 150 mg by mouth at bedtime.     Allergies:   Reglan [metoclopramide]; Paxil [paroxetine hcl]; Tape; Tegaderm ag mesh [silver]; and Ultram [tramadol]   Social History:  The patient  reports that she has never smoked. She has never used smokeless tobacco. She reports that she does not drink alcohol or use drugs.   Family History:  The patient's family history includes Alcohol abuse in her mother; Arrhythmia in her father; Breast cancer (age of onset: 68) in her sister; COPD in her mother; Cancer in her father, maternal uncle, and sister; Congestive Heart Failure in her father; Depression in her father; Diabetes in her other; Heart attack in her father; Heart disease (age of onset: 43) in her paternal grandfather; Heart failure in her father; Hypertension in her father and mother; Thyroid disease in her mother.  ROS:   Review of Systems  Constitutional: Positive for malaise/fatigue. Negative for  chills, diaphoresis, fever and weight loss.  HENT: Negative for congestion.   Eyes: Negative for discharge and redness.  Respiratory: Positive for shortness of breath. Negative for cough, hemoptysis, sputum production and wheezing.   Cardiovascular: Positive for chest pain. Negative for palpitations, orthopnea, claudication, leg swelling and PND.  Gastrointestinal: Positive for heartburn. Negative for abdominal pain, blood in stool, melena, nausea and vomiting.  Genitourinary: Negative for hematuria.  Musculoskeletal: Negative for falls and myalgias.  Skin: Negative for rash.  Neurological: Positive for weakness.  Negative for dizziness, tingling, tremors, sensory change, speech change, focal weakness and loss of consciousness.  Endo/Heme/Allergies: Does not bruise/bleed easily.  Psychiatric/Behavioral: Positive for depression. Negative for substance abuse and suicidal ideas. The patient is not nervous/anxious.   All other systems reviewed and are negative.    PHYSICAL EXAM:  VS:  BP 110/78 (BP Location: Right Arm, Patient Position: Sitting, Cuff Size: Large)   Pulse 62   Ht 5' 5.5" (1.664 m)   Wt 244 lb (110.7 kg)   LMP 08/26/2012 (Exact Date)   BMI 39.99 kg/m  BMI: Body mass index is 39.99 kg/m.  Physical Exam  Constitutional: She is oriented to person, place, and time. She appears well-developed and well-nourished.  HENT:  Head: Normocephalic and atraumatic.  Eyes: Right eye exhibits no discharge. Left eye exhibits no discharge.  Neck: Normal range of motion. No JVD present.  Cardiovascular: Normal rate, regular rhythm, S1 normal, S2 normal and normal heart sounds. Exam reveals no distant heart sounds, no friction rub, no midsystolic click and no opening snap.  No murmur heard. Pulses:      Dorsalis pedis pulses are 2+ on the right side, and 2+ on the left side.       Posterior tibial pulses are 2+ on the right side, and 2+ on the left side.  Pulmonary/Chest: Effort normal and  breath sounds normal. No respiratory distress. She has no decreased breath sounds. She has no wheezes. She has no rales. She exhibits no tenderness.  Abdominal: Soft. She exhibits no distension. There is no tenderness.  Musculoskeletal: She exhibits no edema.  Neurological: She is alert and oriented to person, place, and time.  Skin: Skin is warm and dry. No cyanosis. Nails show no clubbing.  Psychiatric: She has a normal mood and affect. Her speech is normal and behavior is normal. Judgment and thought content normal.     EKG:  Was ordered and interpreted by me today. Shows NSR, 62 bpm, anterolateral TWI (unchanged from prior)  Recent Labs: 09/14/2016: TSH 2.879 01/22/2017: ALT 25 02/07/2017: BUN 11; Creatinine, Ser 1.21; Hemoglobin 11.5; Platelets 224; Potassium 3.8; Sodium 139  No results found for requested labs within last 8760 hours.   CrCl cannot be calculated (Patient's most recent lab result is older than the maximum 21 days allowed.).   Wt Readings from Last 3 Encounters:  08/26/17 244 lb (110.7 kg)  07/30/17 248 lb 1.6 oz (112.5 kg)  06/25/17 249 lb (112.9 kg)     Other studies reviewed: Additional studies/records reviewed today include: summarized above  ASSESSMENT AND PLAN:  1. Mild, nonobstructive CAD of the native arteries: Recent R/LHC as above, essentially normal. Continue current medications including ASA, Lopressor, and simvastatin. No plans for further ischemic evaluation at this time.   2. Atypical chest pain: Recommend she discuss with her PCP regarding non-cardiac symptoms.   3. SOB: Non-cardiac given normal RHC. Not ischemic related given essentially normal LHC. Likely multifactorial including morbid obesity and physical deconditioning. Offered pulmonary rehab, she declined due to lack of insurance. Follow up with PCP.   4. HTN: BP well controlled. Continue current medications. I did not check any labs today given her lack of insurance with significant  social/financial stressors at baseline.   Disposition: F/u with Dr. Fletcher Anon or an APP in 12 months, sooner if needed.   Current medicines are reviewed at length with the patient today.  The patient did not have any concerns regarding medicines.  Signed, Christell Faith, PA-C 08/26/2017 2:03 PM  [address] [place], [place] [address] [phone]

## 2017-08-26 ENCOUNTER — Encounter: Payer: Self-pay | Admitting: Physician Assistant

## 2017-08-26 ENCOUNTER — Ambulatory Visit (INDEPENDENT_AMBULATORY_CARE_PROVIDER_SITE_OTHER): Payer: Self-pay | Admitting: Physician Assistant

## 2017-08-26 VITALS — BP 110/78 | HR 62 | Ht 65.5 in | Wt 244.0 lb

## 2017-08-26 DIAGNOSIS — R0789 Other chest pain: Secondary | ICD-10-CM

## 2017-08-26 DIAGNOSIS — I2510 Atherosclerotic heart disease of native coronary artery without angina pectoris: Secondary | ICD-10-CM

## 2017-08-26 DIAGNOSIS — I10 Essential (primary) hypertension: Secondary | ICD-10-CM

## 2017-08-26 DIAGNOSIS — R0602 Shortness of breath: Secondary | ICD-10-CM

## 2017-08-26 NOTE — Patient Instructions (Addendum)
Medication Instructions: - Your physician recommends that you continue on your current medications as directed. Please refer to the Current Medication list given to you today.  Labwork: - none ordered  Procedures/Testing: - none ordered  Follow-Up: - Your physician wants you to follow-up in: 1 year with Dr. Okey Regal, PA. You will receive a reminder letter in the mail two months in advance. If you don't receive a letter, please call our office to schedule the follow-up appointment.   Any Additional Special Instructions Will Be Listed Below (If Applicable).     If you need a refill on your cardiac medications before your next appointment, please call your pharmacy.

## 2017-09-01 ENCOUNTER — Telehealth: Payer: Self-pay | Admitting: Family Medicine

## 2017-09-01 NOTE — Telephone Encounter (Signed)
Please contact patient I saw in the note from the cardiologist that she was undergoing significant stress, being evicted from her house Please reach out to her and see if she has needs that we can help her meet (will forward to social worker on Monday)

## 2017-09-02 NOTE — Telephone Encounter (Signed)
I spoke with patient She is seeing psychiatrist She found a place and is moving in now; what a blessing She does not think she needs to have a Education officer, museum contact her; she has her needs met now Call us back if there is any change, resources in the community are available She thanked me for the call

## 2017-09-05 ENCOUNTER — Other Ambulatory Visit: Payer: Self-pay | Admitting: Cardiovascular Disease

## 2017-10-02 ENCOUNTER — Other Ambulatory Visit: Payer: Self-pay | Admitting: Cardiovascular Disease

## 2017-10-02 ENCOUNTER — Other Ambulatory Visit: Payer: Self-pay | Admitting: Family Medicine

## 2017-10-02 MED ORDER — FAMOTIDINE 20 MG PO TABS: 20.0000 mg | ORAL_TABLET | Freq: Every day | ORAL | 2 refills | Status: DC

## 2017-10-02 NOTE — Telephone Encounter (Signed)
Left voice mail

## 2017-10-02 NOTE — Telephone Encounter (Signed)
See if patient is willing to step down from omeprazole to an H2 blocker (not zantac)

## 2017-10-02 NOTE — Telephone Encounter (Signed)
Pt called back states willing to start step down

## 2017-10-02 NOTE — Telephone Encounter (Signed)
Thank you so much for checking!

## 2017-12-17 ENCOUNTER — Other Ambulatory Visit: Payer: Self-pay | Admitting: Physician Assistant

## 2018-01-01 HISTORY — PX: RETINAL DETACHMENT SURGERY: SHX105

## 2018-01-02 DIAGNOSIS — H3321 Serous retinal detachment, right eye: Secondary | ICD-10-CM | POA: Insufficient documentation

## 2018-01-09 ENCOUNTER — Other Ambulatory Visit: Payer: Self-pay | Admitting: Family Medicine

## 2018-01-23 ENCOUNTER — Ambulatory Visit: Payer: Self-pay | Admitting: Radiation Oncology

## 2018-02-02 NOTE — Progress Notes (Signed)
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  She continues to feel well and is asymptomatic. She has no neurologic complaints.  She denies any recent fevers or illnesses.  She has a good appetite and denies weight loss.  She has no chest pain or shortness of breath. She denies any nausea, vomiting, constipation, or diarrhea. She has no urinary complaints.  Patient feels at her baseline offers no specific complaints today.  REVIEW OF SYSTEMS:   Review of Systems  Constitutional: Negative.  Negative for fever, malaise/fatigue and weight loss.  HENT: Negative.  Negative for congestion and sore throat.   Eyes: Negative for blurred vision and pain.  Respiratory: Negative.  Negative for cough and shortness of breath.   Cardiovascular: Negative.  Negative for chest pain and leg swelling.  Gastrointestinal: Negative.  Negative for abdominal pain, diarrhea and nausea.  Genitourinary: Negative.   Musculoskeletal: Negative.  Negative for back pain and joint pain.  Skin: Negative.  Negative for rash.  Neurological: Negative.  Negative for sensory change, focal weakness, weakness and headaches.  Psychiatric/Behavioral: Negative.  Negative for depression. The patient is not nervous/anxious.       As per HPI. Otherwise, a complete review of systems is negative.  PAST MEDICAL HISTORY: Past Medical History:  Diagnosis Date  . Anemia    h/o  . Breast cancer ([place] 07/[place]   T1c,N0, ER/ PR negative, Her 2 neu positive. Wide excision, SLN, Mammosite, adjuvant chemotherapy.   . Bronchitis 04-2014  . Coronary artery disease, non-occlusive    a. LHC 4/16 w/ mild nonobstructive disease affecting the LAD with tortuous vessels. CTA in 2017 showed only 30% disease in the LAD. b.Carlton AdamMV 6/18: neg for signficant ischemia, EF 54%, low risk scan  . Detached retina 1997  . Family history of adverse reaction to anesthesia    pts dad has pseudocholinestrace deficieny  . Gastritis   . GERD (gastroesophageal reflux disease)   . History of nuclear stress test    a. MV 6/18: no significant ischemia, normal wall motion, EF 54%. Low risk scan  . HTN (hypertension)   . Hx of papillary thyroid carcinoma 02/05/2017   Dec 2018; T3aN0Mx  s/p total thyroidectomy Dec 19, 2016, s/p I-131 RAI 01/02/17  . Hypothyroidism   . Insomnia   . Last menstrual period (LMP) > 10 days ago    LMP 2014  . Major depressive disorder, recurrent ([facility]   . Migraines   . Morbid obesity ([facility]   . Orthostatic hypotension    a. TTE 1/19:  EF 55-60%, normal wall motion, grade 2 diastolic dysfunction, mild aortic regurgitation, mildly calcified mitral annulus, trivial pericardial effusion was identified  . OSA (obstructive sleep apnea)    untreated due to lack of insurance  . Overactive bladder   . Pericarditis 1997  . Personal history of chemotherapy   .  Personal history of radiation therapy    Mammosite  . Thyroid cancer ([place]) 12/2016   [facility]  . Thyroid nodule 10/24/2016   4.7 cm, October 2018; referred for biopsy    PAST SURGICAL HISTORY: Past Surgical History:  Procedure Laterality Date  . BACK SURGERY     L4-5  . BREAST BIOPSY Left 07/2014   [facility]  . BREAST CYST ASPIRATION Left 12/13/2014   3:00 position 2  cmfn, fat necrosis per Dr. Dwyane Luo notes  . BREAST LUMPECTOMY WITH SENTINEL LYMPH NODE BIOPSY Left 08/13/2014   Procedure: BREAST LUMPECTOMY WITH SENTINEL LYMPH NODE BIOPSY;  Surgeon: Robert Bellow, MD;  Location: [facility] ORS;  Service: General;  Laterality: Left;  . BREAST MAMMOSITE Left 08/30/2014   Procedure: MAMMOSITE BREAST;  Surgeon: Robert Bellow, MD;  Location: [facility] ORS;  Service: General;  Laterality: Left;  . CESAREAN SECTION    . COLONOSCOPY WITH PROPOFOL N/A 04/18/2016   Procedure: COLONOSCOPY WITH PROPOFOL;  Surgeon: Robert Bellow, MD;  Location[facility];  Service: Endoscopy;  Laterality: N/A;  . CORONARY ANGIOPLASTY  April 2016  . EYE SURGERY Left 10/16/2015   retina repair  . FOOT SURGERY    . FORAMINOTOMY 1 LEVEL  [id number]  . KNEE SURGERY    . LAMINECTOMY  [id number]  . PORTACATH PLACEMENT Right 08/30/2014   Procedure: INSERTION PORT-A-CATH;  Surgeon: Robert Bellow, MD;  Location: [facility] ORS;  Service: General;  Laterality: Right;  . POSTERIOR LAMINECTOMY / DECOMPRESSION LUMBAR SPINE  10/29/2016   L3-5  . RETINAL DETACHMENT SURGERY Right 1997  . RIGHT/LEFT HEART CATH AND CORONARY ANGIOGRAPHY Bilateral 02/11/2017   Procedure: RIGHT/LEFT HEART CATH AND CORONARY ANGIOGRAPHY;  Surgeon: Wellington Hampshire, MD;  Location: [place] [facility];  Service: Cardiovascular;  Laterality: Bilateral;  . TOTAL THYROIDECTOMY  12/19/2016  . TOTAL THYROIDECTOMY Bilateral 12/19/2016   [facility]    FAMILY HISTORY Family History  Problem Relation Age of Onset  . Alcohol abuse Mother   . Thyroid disease Mother   . Hypertension Mother   . COPD Mother   . Cancer Father        liposarcoma  . Congestive Heart Failure Father        V Tach  . Depression Father   . Hypertension Father   . Heart attack Father   . Arrhythmia Father   . Heart failure Father   . Cancer Maternal Uncle        Pancreatic cancer  . Heart disease Paternal Grandfather 53       MI  . Diabetes Other   .  Cancer Sister        breast  . Breast cancer Sister 62       ADVANCED DIRECTIVES:    HEALTH MAINTENANCE: Social History   Tobacco Use  . Smoking status: Never Smoker  . Smokeless tobacco: Never Used  Substance Use Topics  . Alcohol use: No  . Drug use: No    Allergies  Allergen Reactions  . Reglan [Metoclopramide] Shortness Of Breath and Other (See Comments)    This medication caused a dystonic reaction.    Marland Kitchen Paxil [Paroxetine Hcl] Other (See Comments)    Reaction:  Blurred vision  . Tape Rash  . Tegaderm Ag Mesh [Silver] Rash and Other (See Comments)    Only if on skin for a long time  . Ultram [Tramadol] Itching and Rash    Current Outpatient Medications  Medication Sig Dispense Refill  . acetaminophen (TYLENOL)  500 MG tablet Take 2 tablets (1,000 mg total) every 6 (six) hours as needed by mouth for mild pain or headache. Maximum dose of acetaminophen 3,000 mg total per day    . ARIPiprazole (ABILIFY) 5 MG tablet Take 5 mg by mouth daily.    Marland Kitchen aspirin EC 81 MG tablet Take 81 mg by mouth daily.    Marland Kitchen buPROPion (WELLBUTRIN SR) 150 MG 12 hr tablet Take 300 mg by mouth daily.     . cyclobenzaprine (FLEXERIL) 5 MG tablet Take 5 mg by mouth 3 (three) times daily as needed for muscle spasms.     . DULoxetine (CYMBALTA) 60 MG capsule Take 1 capsule (60 mg total) by mouth daily. 30 capsule 1  . famotidine (PEPCID) 20 MG tablet TAKE ONE TABLET BY MOUTH EVERY NIGHT AT BEDTIME 90 tablet 1  . fluticasone (FLONASE) 50 MCG/ACT nasal spray Place 2 sprays into both nostrils daily. (Patient taking differently: Place 2 sprays into both nostrils daily as needed for allergies. ) 16 g 6  . LAMOTRIGINE PO Take 10 mg by mouth daily.    Marland Kitchen levothyroxine (SYNTHROID, LEVOTHROID) 150 MCG tablet Take 150 mcg by mouth daily before breakfast.    . meloxicam (MOBIC) 7.5 MG tablet Take 7.5 mg by mouth daily.    . metFORMIN (GLUCOPHAGE) 500 MG tablet Take 500 mg by mouth daily with breakfast.    .  metoprolol tartrate (LOPRESSOR) 25 MG tablet TAKE ONE TABLET BY MOUTH TWICE A DAY 180 tablet 1  . Multiple Vitamins-Iron (MULTIVITAMIN/IRON PO) Take by mouth.    . neomycin-polymyxin b-dexamethasone (MAXITROL) 3.5-10000-0.1 OINT Administer 1 application to the right eye 4 (four) times a day as needed.    Marland Kitchen ofloxacin (OCUFLOX) 0.3 % ophthalmic solution Use one drop  in operative eye four times per day for two weeks    . prednisoLONE acetate (PRED FORTE) 1 % ophthalmic suspension Use one drop in operative eye four times per day for 1 month    . simvastatin (ZOCOR) 10 MG tablet TAKE ONE TABLET BY MOUTH EVERY NIGHT AT BEDTIME 30 tablet 11  . traZODone (DESYREL) 100 MG tablet Take 150 mg by mouth at bedtime.      No current facility-administered medications for this visit.     OBJECTIVE: Vitals:   02/06/18 1424  BP: 115/78  Pulse: 71  Temp: 97.8 F (36.6 C)     Body mass index is 36.54 kg/m.    ECOG FS:0 - Asymptomatic  General: Well-developed, well-nourished, no acute distress. Eyes: Pink conjunctiva, anicteric sclera. HEENT: Normocephalic, moist mucous membranes. Breast: Bilateral breast and axilla without lumps or masses. Lungs: Clear to auscultation bilaterally. Heart: Regular rate and rhythm. No rubs, murmurs, or gallops. Abdomen: Soft, nontender, nondistended. No organomegaly noted, normoactive bowel sounds. Musculoskeletal: No edema, cyanosis, or clubbing. Neuro: Alert, answering all questions appropriately. Cranial nerves grossly intact. Skin: No rashes or petechiae noted. Psych: Normal affect.  LAB RESULTS:  Lab Results  Component Value Date   NA 139 02/07/2017   K 3.8 02/07/2017   CL 103 02/07/2017   CO2 29 02/07/2017   GLUCOSE 103 (H) 02/07/2017   BUN 11 02/07/2017   CREATININE 1.21 (H) 02/07/2017   CALCIUM 9.0 02/07/2017   PROT 6.6 01/22/2017   ALBUMIN 4.0 01/22/2017   AST 40 01/22/2017   ALT 25 01/22/2017   ALKPHOS 89 01/22/2017   BILITOT 0.3 01/22/2017    GFRNONAA 49 (L) 02/07/2017   GFRAA 57 (L) 02/07/2017  Lab Results  Component Value Date   WBC 5.5 02/07/2017   NEUTROABS 3.7 01/22/2017   HGB 11.5 (L) 02/07/2017   HCT 35.1 02/07/2017   MCV 89.9 02/07/2017   PLT 224 02/07/2017     STUDIES: No results found.  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  PLAN:    1. 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  She received adjuvant chemotherapy with Taxotere, carboplatin, and Herceptin, but treatment was discontinued after 3 cycles on November 02, 2014 secondary to persistent side effects.  She completed her year-long maintenance Herceptin on October 04, 2015.  Patient underwent breast biopsy on June 25, 2017 for an abnormality noted on mammogram that was reported as benign.  She will require repeat mammogram in May or June 2019.  Return to clinic in 6 months for routine evaluation.   2. Thyroid cancer: Patient completed thyroidectomy and radioactive iodine ablation in the spring 2019.  Continue current dose of Synthroid.  Follow-up with [facility] endocrinology as scheduled.  I spent a total of 20 minutes face-to-face with the patient of which greater than 50% of the visit was spent in counseling and coordination of care as detailed above.   Patient expressed understanding and was in agreement with this plan. She also understands that She can call clinic at any time with any questions, concerns, or complaints.   Breast cancer, female   Staging form: Breast, AJCC 7th Edition     Pathologic stage from 08/17/2014: Stage IA (T1c, N0, cM0) - Signed by Lloyd Huger, MD on 08/17/2014  Lloyd Huger, MD   02/09/2018 11:35 AM

## 2018-02-06 ENCOUNTER — Inpatient Hospital Stay: Payer: Self-pay | Attending: Oncology

## 2018-02-06 ENCOUNTER — Other Ambulatory Visit: Payer: Self-pay

## 2018-02-06 ENCOUNTER — Inpatient Hospital Stay (HOSPITAL_BASED_OUTPATIENT_CLINIC_OR_DEPARTMENT_OTHER): Payer: Self-pay | Admitting: Oncology

## 2018-02-06 VITALS — BP 115/78 | HR 71 | Temp 97.8°F | Ht 65.5 in | Wt 223.0 lb

## 2018-02-06 DIAGNOSIS — K219 Gastro-esophageal reflux disease without esophagitis: Secondary | ICD-10-CM | POA: Insufficient documentation

## 2018-02-06 DIAGNOSIS — F329 Major depressive disorder, single episode, unspecified: Secondary | ICD-10-CM

## 2018-02-06 DIAGNOSIS — Z9221 Personal history of antineoplastic chemotherapy: Secondary | ICD-10-CM | POA: Insufficient documentation

## 2018-02-06 DIAGNOSIS — Z79899 Other long term (current) drug therapy: Secondary | ICD-10-CM

## 2018-02-06 DIAGNOSIS — E041 Nontoxic single thyroid nodule: Secondary | ICD-10-CM | POA: Insufficient documentation

## 2018-02-06 DIAGNOSIS — G4700 Insomnia, unspecified: Secondary | ICD-10-CM | POA: Insufficient documentation

## 2018-02-06 DIAGNOSIS — E039 Hypothyroidism, unspecified: Secondary | ICD-10-CM

## 2018-02-06 DIAGNOSIS — Z923 Personal history of irradiation: Secondary | ICD-10-CM

## 2018-02-06 DIAGNOSIS — Z85850 Personal history of malignant neoplasm of thyroid: Secondary | ICD-10-CM

## 2018-02-06 DIAGNOSIS — Z7982 Long term (current) use of aspirin: Secondary | ICD-10-CM | POA: Insufficient documentation

## 2018-02-06 DIAGNOSIS — I2510 Atherosclerotic heart disease of native coronary artery without angina pectoris: Secondary | ICD-10-CM

## 2018-02-06 DIAGNOSIS — Z170 Estrogen receptor positive status [ER+]: Principal | ICD-10-CM

## 2018-02-06 DIAGNOSIS — Z171 Estrogen receptor negative status [ER-]: Secondary | ICD-10-CM | POA: Insufficient documentation

## 2018-02-06 DIAGNOSIS — I10 Essential (primary) hypertension: Secondary | ICD-10-CM | POA: Insufficient documentation

## 2018-02-06 DIAGNOSIS — E669 Obesity, unspecified: Secondary | ICD-10-CM

## 2018-02-06 DIAGNOSIS — C50512 Malignant neoplasm of lower-outer quadrant of left female breast: Secondary | ICD-10-CM | POA: Insufficient documentation

## 2018-02-06 NOTE — Progress Notes (Signed)
Patient is here today to follow up on her left breast cancer. Patient stated that she had been doing well. Patient denied nipple discharge and skin discoloration. Patient stated that she tries to do her self-breast exams when she remembers. Patient's last mammogram and U/S were done on 06/2017.

## 2018-02-07 LAB — CANCER ANTIGEN 27.29: CA 27.29: 13 U/mL (ref 0.0–38.6)

## 2018-03-13 ENCOUNTER — Other Ambulatory Visit: Payer: Self-pay | Admitting: Family Medicine

## 2018-03-13 ENCOUNTER — Ambulatory Visit: Payer: Medicaid Other | Admitting: Family Medicine

## 2018-03-13 ENCOUNTER — Ambulatory Visit
Admission: RE | Admit: 2018-03-13 | Discharge: 2018-03-13 | Disposition: A | Discharge status: Home / Self Care | Payer: Medicaid Other | Attending: Family Medicine | Admitting: Family Medicine

## 2018-03-13 ENCOUNTER — Encounter: Payer: Self-pay | Admitting: Family Medicine

## 2018-03-13 ENCOUNTER — Ambulatory Visit
Admission: RE | Admit: 2018-03-13 | Discharge: 2018-03-13 | Disposition: A | Discharge status: Home / Self Care | Payer: Medicaid Other | Source: Ambulatory Visit | Attending: Family Medicine | Admitting: Family Medicine

## 2018-03-13 VITALS — BP 138/86 | HR 92 | Temp 100.4°F | Resp 14 | Ht 65.5 in | Wt 217.1 lb

## 2018-03-13 DIAGNOSIS — K219 Gastro-esophageal reflux disease without esophagitis: Secondary | ICD-10-CM

## 2018-03-13 DIAGNOSIS — R509 Fever, unspecified: Secondary | ICD-10-CM

## 2018-03-13 DIAGNOSIS — R05 Cough: Secondary | ICD-10-CM

## 2018-03-13 DIAGNOSIS — J301 Allergic rhinitis due to pollen: Secondary | ICD-10-CM

## 2018-03-13 DIAGNOSIS — R1310 Dysphagia, unspecified: Secondary | ICD-10-CM

## 2018-03-13 DIAGNOSIS — G119 Hereditary ataxia, unspecified: Secondary | ICD-10-CM

## 2018-03-13 DIAGNOSIS — R059 Cough, unspecified: Secondary | ICD-10-CM

## 2018-03-13 DIAGNOSIS — K130 Diseases of lips: Secondary | ICD-10-CM

## 2018-03-13 DIAGNOSIS — J029 Acute pharyngitis, unspecified: Secondary | ICD-10-CM

## 2018-03-13 DIAGNOSIS — Z124 Encounter for screening for malignant neoplasm of cervix: Secondary | ICD-10-CM

## 2018-03-13 DIAGNOSIS — F331 Major depressive disorder, recurrent, moderate: Secondary | ICD-10-CM

## 2018-03-13 DIAGNOSIS — E669 Obesity, unspecified: Secondary | ICD-10-CM

## 2018-03-13 DIAGNOSIS — Z85850 Personal history of malignant neoplasm of thyroid: Secondary | ICD-10-CM

## 2018-03-13 LAB — POCT INFLUENZA A/B
Influenza A, POC: NEGATIVE
Influenza B, POC: NEGATIVE

## 2018-03-13 LAB — POCT RAPID STREP A (OFFICE): RAPID STREP A SCREEN: NEGATIVE

## 2018-03-13 MED ORDER — OMEPRAZOLE 20 MG PO CPDR: 20.0000 mg | DELAYED_RELEASE_CAPSULE | Freq: Every day | ORAL | 0 refills | Status: DC

## 2018-03-13 MED ORDER — NYSTATIN 100000 UNIT/GM EX CREA: 1.0000 "application " | TOPICAL_CREAM | Freq: Two times a day (BID) | CUTANEOUS | 0 refills | Status: DC

## 2018-03-13 MED ORDER — FLUTICASONE PROPIONATE 50 MCG/ACT NA SUSP: 2.0000 | Freq: Every day | NASAL | 6 refills | Status: DC

## 2018-03-13 NOTE — Patient Instructions (Addendum)
Someone should be contacting you about the neurologist appointment soon  Please go across the street for a chest xray today If you have not heard anything from my staff in a week about any orders/referrals/studies from today, please contact us here to follow-up [phone]  Stop famotidine Start omeprazole We'll have you see the gastroenterologist  Try vitamin C (orange juice if not diabetic or vitamin C tablets) and drink green tea to help your immune system during your illness Get plenty of rest and hydration  Do contact your psychiatrist about your mood Here are some resources to help you if you feel you are in a mental health crisis:  [place] - Call [phone]  for help - Website with more resources: GripT[employer].com.pt  [employer] [facility] - Call 2501179903 for help. - Mobile Crisis Program available 24 hours a day, 365 days a year. - Available for anyone of any age in [place] counties.  [facility] [employer] - Address: [address], [place] - Telephone: [phone]  - Hours of Operation: Sunday - Saturday - 8:00 a.m. - 8:00 p.m. - Medicaid, Medicare (Government Issued Only), BCBS, and [place] [employer], [place], Psychiatrists on-site to provide medication management, [place], and Peer Support Care.  [facility] - Call [phone] for help. - Mobile Crisis Program available 24 hours a day, 365 days a year. - Available for anyone of any age in [place]

## 2018-03-13 NOTE — Progress Notes (Signed)
BP 138/86   Pulse 92   Temp (!) 100.4 F (38 C) (Oral)   Resp 14   Ht 5' 5.5" (1.664 m)   Wt 217 lb 1.6 oz (98.5 kg)   LMP 08/26/2012 (Exact Date)   SpO2 98%   BMI 35.58 kg/m    Subjective:    Patient ID: Robin Howard, female    DOB: [birth year], 58 y.o.   MRN: [id number]  HPI: Robin Howard is a 58 y.o. female  Chief Complaint  Patient presents with  . Referral    neurology  . URI    fever, sore throat and headache   . Gastroesophageal Reflux    famotiine not working  . Oral Swelling    wants cream for corners of mouth    HPI She is here for f/u  Sick with fever, sore throat, headache; has to blow her nose all the time, but that isn't new; her throat has been sore for 5 days; she has not looked at it; can't see her throat; no rash; ears, left >> right have been crackling  She says she needs to go back to prilosec, current famotidine is not working; she was taken off of prilosec in 2018 or so; she is having trouble swallowing; that started several months ago; had a barium swallow test done in [facility]; she mentioned it to her thyroid cancer doctor, and he ordered the swallow test; she can still feel it even though they didn't see it; food keeps coming back up; no blood in the stool  She wants to go to see neurologist; 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 Gretta Cool in [place]; may get injection soon  She did get flu shot in 2018, but not this season  She is going to get her pap smear at cancer center  Seeing heart doctor   Chronic cough; someone put her on claritin-D to take (I advised do not take)  The synthroid is from her endocrinologist, hx of thyroid cancer  Nutritionist is managing her metformin, trying to lose weight and  eat healthy; fell off the wagon after Christmas; family issues and had to move; she is not homeless, has an apartment; seeing psychiatrist in [place], managing her psych medicines; having thoughts that she wishes she wouldn't wake up; psych knows this and they have asked her if she wants to go in the hospital but she doesn't want to; going on since Sept; kicked out sister's house in August  She gets scabbing and redness in the corners of her mouth  She is sleeping too much in the mornings; not waking up or hitting snooze and it doesn't go off again and then she sleeps until 12 noon or 1 pm; she thinks she wrote about it with psychiatrist; she has a sleep specialist at [facility] and she'll talk to them about this  Depression screen [facility] 2/9 03/13/2018 11/20/2016 10/15/2016 08/22/2016 08/22/2016  Decreased Interest 2 1 1 1  0  Down, Depressed, Hopeless 3 1 1 1  0  PHQ - 2 Score 5 2 2 2  0  Altered sleeping 3 0 1 2 -  Tired, decreased energy 3 3 - 3 -  Change in appetite 3 1 0 0 -  Feeling bad or failure about yourself  2 1 0 1 -  Trouble concentrating 3 3 1 3  -  Moving slowly or fidgety/restless 0 0 1 3 -  Suicidal thoughts 2 0 0 1 -  PHQ-9 Score 21 10 5 15  -  Difficult doing work/chores Very difficult Very difficult Not difficult at all Somewhat difficult -  Some recent data might be hidden  MD note: seeing psychiatrist, psych aware of suicidality (passive); offered hospitalization but she declined  Fall Risk  03/13/2018 11/20/2016 10/15/2016 08/22/2016 08/22/2016  Falls in the past year? 1 Yes No No No  Number falls in past yr: 1 2 or more - - -  Injury with Fall? 0 Yes - - -    Relevant past medical, surgical, family and social history reviewed Past Medical History:  Diagnosis Date  . Anemia    h/o  . Breast cancer ([place]) 07/2014   T1c,N0, ER/ PR negative, Her 2 neu positive. Wide excision, SLN, Mammosite, adjuvant chemotherapy.   . Bronchitis 04-2014  . Coronary artery disease, non-occlusive     a. LHC 4/16 w/ mild nonobstructive disease affecting the LAD with tortuous vessels. CTA in 2017 showed only 30% disease in the LAD. bCarlton Adam MV 6/18: neg for signficant ischemia, EF 54%, low risk scan  . Detached retina 1997  . Family history of adverse reaction to anesthesia    pts dad has pseudocholinestrace deficieny  . Gastritis   . GERD (gastroesophageal reflux disease)   . History of nuclear stress test    a. MV 6/18: no significant ischemia, normal wall motion, EF 54%. Low risk scan  . HTN (hypertension)   . Hx of papillary thyroid carcinoma 02/05/2017   Dec 2018; T3aN0Mx  s/p total thyroidectomy Dec 19, 2016, s/p I-131 RAI 01/02/17  . Hypothyroidism   . Insomnia   . Last menstrual period (LMP) > 10 days ago    LMP 2014  . Major depressive disorder, recurrent ([place])   . Migraines   . Morbid obesity ([place])   . Orthostatic hypotension    a. TTE 1/19:  EF 55-60%, normal wall motion, grade 2 diastolic dysfunction, mild aortic regurgitation, mildly calcified mitral annulus, trivial pericardial effusion was identified  . OSA (obstructive sleep apnea)    untreated due to lack of insurance  . Overactive bladder   . Pericarditis 1997  . Personal history of chemotherapy   . Personal history of radiation therapy    Mammosite  . Thyroid cancer ([place]) 12/2016   [facility]  . Thyroid nodule 10/24/2016   4.7 cm, October 2018; referred for biopsy   Past Surgical History:  Procedure Laterality Date  . BACK SURGERY     L4-5  . BREAST BIOPSY Left 07/2014   [facility]  . BREAST CYST ASPIRATION Left 12/13/2014   3:00 position 2 cmfn, fat necrosis per Dr. Dwyane Luo notes  . BREAST LUMPECTOMY WITH SENTINEL LYMPH NODE BIOPSY Left 08/13/2014   Procedure: BREAST LUMPECTOMY WITH SENTINEL LYMPH NODE BIOPSY;  Surgeon: Robert Bellow, MD;  Location: [facility] ORS;  Service: General;  Laterality: Left;  . BREAST MAMMOSITE Left 08/30/2014   Procedure: MAMMOSITE BREAST;  Surgeon: Robert Bellow, MD;  Location:  [facility] ORS;  Service: General;  Laterality: Left;  . CESAREAN SECTION    . COLONOSCOPY WITH PROPOFOL N/A 04/18/2016   Procedure: COLONOSCOPY WITH PROPOFOL;  Surgeon: Robert Bellow, MD;  Location: [facility] ENDOSCOPY;  Service: Endoscopy;  Laterality: N/A;  . CORONARY ANGIOPLASTY  April 2016  .  EYE SURGERY Left 10/16/2015   retina repair  . FOOT SURGERY    . FORAMINOTOMY 1 LEVEL  [id number]  . KNEE SURGERY    . LAMINECTOMY  [id number]  . PORTACATH PLACEMENT Right 08/30/2014   Procedure: INSERTION PORT-A-CATH;  Surgeon: Robert Bellow, MD;  Location: [facility] ORS;  Service: General;  Laterality: Right;  . POSTERIOR LAMINECTOMY / DECOMPRESSION LUMBAR SPINE  10/29/2016   L3-5  . RETINAL DETACHMENT SURGERY Right 1997  . RETINAL DETACHMENT SURGERY  01/01/2018  . RIGHT/LEFT HEART CATH AND CORONARY ANGIOGRAPHY Bilateral 02/11/2017   Procedure: RIGHT/LEFT HEART CATH AND CORONARY ANGIOGRAPHY;  Surgeon: Wellington Hampshire, MD;  Location: [facility] CV LAB;  Service: Cardiovascular;  Laterality: Bilateral;  . TOTAL THYROIDECTOMY  12/19/2016  . TOTAL THYROIDECTOMY Bilateral 12/19/2016   [facility]   Family History  Problem Relation Age of Onset  . Alcohol abuse Mother   . Thyroid disease Mother   . Hypertension Mother   . COPD Mother   . Cancer Father        liposarcoma  . Congestive Heart Failure Father        V Tach  . Depression Father   . Hypertension Father   . Heart attack Father   . Arrhythmia Father   . Heart failure Father   . Cancer Maternal Uncle        Pancreatic cancer  . Heart disease Paternal Grandfather 47       MI  . Diabetes Other   . Cancer Sister        breast  . Breast cancer Sister 52   Social History   Tobacco Use  . Smoking status: Never Smoker  . Smokeless tobacco: Never Used  Substance Use Topics  . Alcohol use: No  . Drug use: No     Interim medical history since last visit reviewed. Allergies and medications reviewed  Review of Systems Per HPI unless  specifically indicated above     Objective:    BP 138/86   Pulse 92   Temp (!) 100.4 F (38 C) (Oral)   Resp 14   Ht 5' 5.5" (1.664 m)   Wt 217 lb 1.6 oz (98.5 kg)   LMP 08/26/2012 (Exact Date)   SpO2 98%   BMI 35.58 kg/m   Wt Readings from Last 3 Encounters:  03/13/18 217 lb 1.6 oz (98.5 kg)  02/06/18 223 lb (101.2 kg)  08/26/17 244 lb (110.7 kg)    Physical Exam Constitutional:      General: She is not in acute distress.    Appearance: She is well-developed. She is not diaphoretic.  HENT:     Head: Normocephalic and atraumatic.     Mouth/Throat:     Mouth: Mucous membranes are moist.     Comments: Mild erythema RIGHT lateral canthus Eyes:     General: No scleral icterus. Neck:     Thyroid: No thyromegaly.  Cardiovascular:     Rate and Rhythm: Normal rate and regular rhythm.     Heart sounds: Normal heart sounds. No murmur.  Pulmonary:     Effort: Pulmonary effort is normal. No respiratory distress.     Breath sounds: Normal breath sounds. No wheezing.  Abdominal:     General: Bowel sounds are normal. There is no distension.     Palpations: Abdomen is soft.  Skin:    General: Skin is warm and dry.     Coloration: Skin is not pale.  Neurological:  Mental Status: She is alert.  Psychiatric:        Mood and Affect: Mood is anxious and depressed. Affect is not flat or inappropriate.        Behavior: Behavior normal.        Thought Content: Thought content normal. Thought content does not include homicidal or suicidal plan.        Judgment: Judgment normal.     Comments: Good eye contact; good hygiene     Results for orders placed or performed in visit on 03/13/18  POCT Influenza A/B  Result Value Ref Range   Influenza A, POC Negative Negative   Influenza B, POC Negative Negative  POCT rapid strep A  Result Value Ref Range   Rapid Strep A Screen Negative Negative      Assessment & Plan:   Problem List Items Addressed This Visit      Respiratory    Allergic rhinitis    Refilled nasal corticosteroid        Digestive   GERD (gastroesophageal reflux disease)    Patient has undergone testing at [facility]; wishes to switch med; she needs to see GI for EGD; referral entered      Relevant Medications   omeprazole (PRILOSEC) 20 MG capsule   Other Relevant Orders   Ambulatory referral to Gastroenterology     Other   Obesity (BMI 35.0-39.9 without comorbidity)    BMI >35 with depression, GERD; patient is seeing a nutritionist and on metformin for this apparently      Major depressive disorder, recurrent ([place])    Supportive listening provided; patient is working with a psychiatrist; she denies offer for help today, including admission to get her medicines under control; list of resources provided      Hx of papillary thyroid carcinoma    Managed by specialist      Cervical cancer screening    Patient says she will get this done through the cancer center       Other Visit Diagnoses    Sore throat    -  Primary   suspect viral etiology; testing today was negative for flu, strep; rest, hydration; ;no need for abx   Relevant Orders   POCT Influenza A/B (Completed)   POCT rapid strep A (Completed)   Grp A Strep   Fever, unspecified fever cause       testing today neg for flu, strep; likely viral; rest and supportive care   Relevant Orders   POCT Influenza A/B (Completed)   POCT rapid strep A (Completed)   Grp A Strep   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 Sanda Klein.  An after-visit summary was printed and given to  the patient at [place].  Please see the patient instructions which may contain other information and recommendations beyond what is mentioned above in the assessment and plan.  Meds ordered this encounter  Medications  . fluticasone (FLONASE) 50 MCG/ACT nasal spray  Sig: Place 2 sprays into both nostrils daily.    Dispense:  16 g    Refill:  6  . omeprazole (PRILOSEC) 20 MG capsule    Sig: Take 1 capsule (20 mg total) by mouth daily.    Dispense:  30 capsule    Refill:  0  . nystatin cream (MYCOSTATIN)    Sig: Apply 1 application topically 2 (two) times daily. Do not get inside mouth    Dispense:  30 g    Refill:  0    Orders Placed This Encounter  Procedures  . Grp A Strep  . DG Chest 2 View  . Ambulatory referral to Neurology  . Ambulatory referral to Gastroenterology  . POCT Influenza A/B  . POCT rapid strep A

## 2018-03-15 LAB — CULTURE, GROUP A STREP
MICRO NUMBER:: 252195
SPECIMEN QUALITY:: ADEQUATE

## 2018-03-20 DIAGNOSIS — J309 Allergic rhinitis, unspecified: Secondary | ICD-10-CM | POA: Insufficient documentation

## 2018-03-20 DIAGNOSIS — Z124 Encounter for screening for malignant neoplasm of cervix: Secondary | ICD-10-CM | POA: Insufficient documentation

## 2018-03-20 NOTE — Assessment & Plan Note (Signed)
Managed by specialist 

## 2018-03-20 NOTE — Assessment & Plan Note (Addendum)
Patient has undergone testing at [facility]; wishes to switch med; she needs to see GI for EGD; referral entered

## 2018-03-20 NOTE — Assessment & Plan Note (Signed)
Supportive listening provided; patient is working with a psychiatrist; she denies offer for help today, including admission to get her medicines under control; list of resources provided

## 2018-03-20 NOTE — Assessment & Plan Note (Signed)
Patient says she will get this done through the cancer center

## 2018-03-20 NOTE — Assessment & Plan Note (Signed)
BMI >35 with depression, GERD; patient is seeing a nutritionist and on metformin for this apparently

## 2018-03-20 NOTE — Assessment & Plan Note (Signed)
Refilled nasal corticosteroid

## 2018-03-31 ENCOUNTER — Ambulatory Visit: Payer: Medicaid Other | Admitting: Family Medicine

## 2018-04-22 ENCOUNTER — Other Ambulatory Visit: Payer: Self-pay | Admitting: Family Medicine

## 2018-04-22 NOTE — Telephone Encounter (Signed)
Dr. Sanda Klein is out of the office today. It looks like omeprazole was prescribed short-term and she was to GI. Would she be willing to do a virtual visit today to discuss GERD with a  Provider today?

## 2018-04-23 NOTE — Telephone Encounter (Signed)
Pt says she has a phone visit with GI on 05/06/18. Pt states she cannot afford a visit right now. Would like something to take until her GI appt. Please advise.

## 2018-04-23 NOTE — Telephone Encounter (Signed)
Rx approved

## 2018-04-24 ENCOUNTER — Ambulatory Visit: Payer: Self-pay | Admitting: *Deleted

## 2018-04-24 ENCOUNTER — Telehealth: Payer: Medicaid Other | Admitting: Physician Assistant

## 2018-04-24 DIAGNOSIS — R05 Cough: Secondary | ICD-10-CM

## 2018-04-24 DIAGNOSIS — R059 Cough, unspecified: Secondary | ICD-10-CM

## 2018-04-24 NOTE — Telephone Encounter (Signed)
Pt reports multiple symptoms. States saw Dr. Sanda Klein 03/13/2018 for sore throat, flu like symptoms, States "Never felt any better." States "Don't feel worse, just no better." States did  Evisit today and was instructed to call PCP. Reports fever MAx 100.4 at office 03/13/2018 States since then "Just under 100.0" Reports body aches increased fatigue, dry cough. Reports mild SOB with exertion only. Pt aware pratice currently closed. Assured [place] would route to Dr. Sanda Klein. Verified email and phone #, reviewed virtual visit process. No travel, no known exposure. Care instructions given. Advised to self isolate , monitor temp. Advised to go to ED if SOB, symptoms worsen. Pt verbalizes understanding.  CB# [phone]  Reason for Disposition . 1] COVID-19 infection diagnosed or suspected AND [2] mild symptoms (fever, cough) AND [2] no trouble breathing or other complications  Answer Assessment - Initial Assessment Questions 1. COVID-19 DIAGNOSIS: "Who made your Coronavirus (COVID-19) diagnosis?" "Was it confirmed by a positive lab test?" If not diagnosed by a HCP, ask "Are there lots of cases (community spread) where you live?" (See public health department website, if unsure)   * MAJOR community spread: high number of cases; numbers of cases are increasing; many people hospitalized.   * MINOR community spread: low number of cases; not increasing; few or no people hospitalized     N/A 2. ONSET: "When did the COVID-19 symptoms start?"      03/13/2018 3. WORST SYMPTOM: "What is your worst symptom?" (e.g., cough, fever, shortness of breath, muscle aches)     All of them 4. COUGH: "How bad is the cough?"       Spells at times 5. FEVER: "Do you have a fever?" If so, ask: "What is your temperature, how was it measured, and when did it start?"     Max 100.4 at [place]. Been fluctuating "Little under 100.0 since 03/13/2018 6. RESPIRATORY STATUS: "Describe your breathing?" (e.g., shortness of breath, wheezing, unable to speak)      Minimal sob with exertion only 7. BETTER-SAME-WORSE: "Are you getting better, staying the same or getting worse compared to yesterday?"  If getting worse, ask, "In what way?"    same 8. HIGH RISK DISEASE: "Do you have any chronic medical problems?" (e.g., asthma, heart or lung disease, weak immune system, etc.)     *No Answer*  10. OTHER SYMPTOMS: "Do you have any other symptoms?"  (e.g., runny nose, headache, sore throat, loss of smell)       Body ,aches, sore throat, increased fatigue  Protocols used: CORONAVIRUS (COVID-19) DIAGNOSED OR SUSPECTED-A-AH

## 2018-04-24 NOTE — Progress Notes (Signed)
Based on what you shared with me, I feel your condition warrants further evaluation and I recommend that you be seen for a face to face office visit. Follow-up with your primary care provider.      NOTE: If you entered your credit card information for this eVisit, you will not be charged. You may see a "hold" on your card for the $35 but that hold will drop off and you will not have a charge processed.  If you are having a true medical emergency please call 911.  If you need an urgent face to face visit, [facility] has four urgent care centers for your convenience.    PLEASE NOTE: THE [facility] LOCATIONS AND URGENT CARE CLINICS DO NOT HAVE THE TESTING FOR CORONAVIRUS COVID19 AVAILABLE.  IF YOU FEEL YOU NEED THIS TEST YOU MUST GO TO A TRIAGE LOCATION AT [facility]   [email or web address] to reserve your spot online an avoid wait times  [facility] [address], [address] [place], [place] [address] Modified hours of operation: Monday-Friday, 10 AM to 6 PM  Saturday & Sunday 10 AM to 4 PM *Across the street from [place] (New Address!) [address], [place], [place] [address] *Just off [address], across the road from [place] hours of operation: Monday-Friday, 10 AM to 5 PM  Closed Saturday & Sunday   The following sites will take your insurance:  . [facility] [place] a Provider at this Location  [address] [place], [place] [address] . 10 am to 8 pm Monday-Friday . 12 pm to 8 pm Saturday-Sunday   . [facility] at [place] a Provider at this Location  [place], [place], [place] [address] . 8 am to 8 pm Monday-Friday . 9 am to 6 pm Saturday . 11 am to 6 pm Sunday   . [facility] at [place] Get Driving Directions  [address] Suite [place], [place] [address] . 8 am to 8 pm Monday-Friday . 8 am to 4 pm Saturday-Sunday   Your e-visit answers were reviewed by a board certified advanced clinical practitioner to complete your personal care plan.  Thank you for using e-Visits.

## 2018-04-25 ENCOUNTER — Other Ambulatory Visit: Payer: Self-pay

## 2018-04-25 ENCOUNTER — Encounter: Payer: Self-pay | Admitting: Family Medicine

## 2018-04-25 ENCOUNTER — Ambulatory Visit: Payer: Medicaid Other

## 2018-04-25 ENCOUNTER — Encounter: Payer: Self-pay | Admitting: Emergency Medicine

## 2018-04-25 ENCOUNTER — Ambulatory Visit: Payer: Medicaid Other | Admitting: Family Medicine

## 2018-04-25 ENCOUNTER — Ambulatory Visit
Admission: EM | Admit: 2018-04-25 | Discharge: 2018-04-25 | Disposition: A | Discharge status: Home / Self Care | Payer: Medicaid Other | Attending: Family Medicine | Admitting: Family Medicine

## 2018-04-25 DIAGNOSIS — R059 Cough, unspecified: Secondary | ICD-10-CM

## 2018-04-25 DIAGNOSIS — R05 Cough: Secondary | ICD-10-CM

## 2018-04-25 DIAGNOSIS — B9789 Other viral agents as the cause of diseases classified elsewhere: Secondary | ICD-10-CM | POA: Insufficient documentation

## 2018-04-25 DIAGNOSIS — J069 Acute upper respiratory infection, unspecified: Secondary | ICD-10-CM | POA: Insufficient documentation

## 2018-04-25 DIAGNOSIS — J029 Acute pharyngitis, unspecified: Secondary | ICD-10-CM

## 2018-04-25 DIAGNOSIS — R45851 Suicidal ideations: Secondary | ICD-10-CM

## 2018-04-25 MED ORDER — HYDROCOD POLST-CPM POLST ER 10-8 MG/5ML PO SUER: 5.0000 mL | Freq: Two times a day (BID) | ORAL | 0 refills | Status: DC | PRN

## 2018-04-25 NOTE — ED Triage Notes (Signed)
Pt c/o cough, headache, sore throat, muscle aches, SOB, chills, and fever. Started about 2-3 days ago. She has had intermittent cough and sore throat for the last month but recently worse.

## 2018-04-25 NOTE — Progress Notes (Signed)
LMP 08/26/2012 (Exact Date)    Subjective:    Patient ID: Robin Howard, female    DOB: [birth year], 58 y.o.   MRN: [id number]  HPI: Robin Howard is a 58 y.o. female  Chief Complaint  Patient presents with   URI    onset months since last affice visit fever comes and goes, sore throat, body aches, headaches, cough, SOB with exertion     HPI Virtual Visit via Telephone/Video Note   I connected with the patient via:  Doximity Video (HIPAA secure) I verified that I am speaking with the correct person using two identifiers.  Provider location: home, upstairs office with door closed, earphones/headset on Patient location: home Additional participants: no one Call started: 2:44 pm   I discussed the limitations, risks, and privacy concerns of performing an evaluation and management service by telephone and the availability of in-person appointments. I explained that he/she may be responsible for charges related to this service. The patient expressed understanding and DID NOT agree to proceed. She says she does not have the money. She has Medicaid family planning only and cannot pay for a visit.  She tearfully explains that she wants to be tested for COVID-19. I explained that we do not test for that per our current protocol anyway, and recommended that she be seen in either an urgent care or ER. She should call ahead to notify them to safeguard their staff and waiting area, for example they may want to meet her at a side door with a mask to keep patients in their lobby / waiting area from being exposed. She understood. She has internet access and will look up the numbers to [facility] to see if testing can be done there rather go right to the ER if stable, but go to the ER if needed; she is okay to call and get there on her own; does not need to call 911 she says; I actually ended up helping her get the numbers to both [facility] and [facility] and she understands to go  to the ER if they can't test her  I reviewed her PHQ-9 gotten by the CMA and talked with her about this, even after she explained she could not pay for the visit. I talked to her about the suicidal thoughts. She spoke with her psychiatrist yesterday. She says they are just thoughts, she would not do anything. Psychiatrist knows and she has her on 4 medicines. I offered help and she says she is okay. I recommended that she reach out to her psychiatrist or get help if her thoughts darken, she starts to think of a plan or think of harming herself. She agrees.  Depression screen [facility] 2/9 04/25/2018 03/13/2018 11/20/2016 10/15/2016 08/22/2016  Decreased Interest 1 2 1 1 1   Down, Depressed, Hopeless 2 3 1 1 1   PHQ - 2 Score 3 5 2 2 2   Altered sleeping 1 3 0 1 2  Tired, decreased energy 1 3 3  - 3  Change in appetite 1 3 1  0 0  Feeling bad or failure about yourself  1 2 1  0 1  Trouble concentrating 1 3 3 1 3   Moving slowly or fidgety/restless 0 0 0 1 3  Suicidal thoughts 2 2 0 0 1  PHQ-9 Score 10 21 10 5 15   Difficult doing work/chores Extremely dIfficult Very difficult Very difficult Not difficult at all Somewhat difficult  Some recent data might be hidden  Fall Risk  04/25/2018 04/25/2018 03/13/2018 11/20/2016 10/15/2016  Falls in the past year? 1 1 1  Yes No  Number falls in past yr: 1 0 1 2 or more -  Injury with Fall? 1 1 0 Yes -    Relevant past medical, surgical, family and social history reviewed Past Medical History:  Diagnosis Date   Anemia    h/o   Breast cancer ([place]) 07/2014   T1c,N0, ER/ PR negative, Her 2 neu positive. Wide excision, SLN, Mammosite, adjuvant chemotherapy.    Bronchitis 04-2014   Coronary artery disease, non-occlusive    a. LHC 4/16 w/ mild nonobstructive disease affecting the LAD with tortuous vessels. CTA in 2017 showed only 30% disease in the LAD. bCarlton Adam MV 6/18: neg for signficant ischemia, EF 54%, low risk scan   Detached retina 1997   Family history of  adverse reaction to anesthesia    pts dad has pseudocholinestrace deficieny   Gastritis    GERD (gastroesophageal reflux disease)    History of nuclear stress test    a. MV 6/18: no significant ischemia, normal wall motion, EF 54%. 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 TTE 1/19:  EF 55-60%, normal wall motion, grade 2 diastolic dysfunction, mild aortic regurgitation, mildly calcified mitral annulus, trivial pericardial effusion was identified   OSA (obstructive sleep apnea)    untreated due to lack of insurance   Overactive bladder    Pericarditis 1997   Personal history of chemotherapy    Personal history of radiation therapy    Mammosite   Thyroid cancer ([place]) 12/2016   [facility]   Thyroid nodule 10/24/2016   4.7 cm, October 2018; referred for biopsy   Past Surgical History:  Procedure Laterality Date   BACK SURGERY     L4-5   BREAST BIOPSY Left 07/2014   [facility]   BREAST CYST ASPIRATION Left 12/13/2014   3:00 position 2 cmfn, fat necrosis per Dr. Dwyane Luo notes   BREAST LUMPECTOMY WITH SENTINEL LYMPH NODE BIOPSY Left 08/13/2014   Procedure: BREAST LUMPECTOMY WITH SENTINEL LYMPH NODE BIOPSY;  Surgeon: Robert Bellow, MD;  Location: [facility] ORS;  Service: General;  Laterality: Left;   BREAST MAMMOSITE Left 08/30/2014   Procedure: MAMMOSITE BREAST;  Surgeon: Robert Bellow, MD;  Location: [facility] ORS;  Service: General;  Laterality: Left;   CESAREAN SECTION     COLONOSCOPY WITH PROPOFOL N/A 04/18/2016   Procedure: COLONOSCOPY WITH PROPOFOL;  Surgeon: Robert Bellow, MD;  Location: [facility] ENDOSCOPY;  Service: Endoscopy;  Laterality: N/A;     CORONARY ANGIOPLASTY  April 2016   EYE SURGERY Left 10/16/2015   retina repair   FOOT SURGERY     FORAMINOTOMY 1 LEVEL  [id number]   KNEE SURGERY     LAMINECTOMY  [id number]   PORTACATH PLACEMENT Right 08/30/2014   Procedure: INSERTION PORT-A-CATH;  Surgeon: Robert Bellow, MD;  Location: [facility] ORS;  Service: General;  Laterality: Right;   POSTERIOR LAMINECTOMY / DECOMPRESSION LUMBAR SPINE  10/29/2016   L3-5   RETINAL DETACHMENT SURGERY Right 1997  RETINAL DETACHMENT SURGERY  01/01/2018   RIGHT/LEFT HEART CATH AND CORONARY ANGIOGRAPHY Bilateral 02/11/2017   Procedure: RIGHT/LEFT HEART CATH AND CORONARY ANGIOGRAPHY;  Surgeon: Wellington Hampshire, MD;  Location: [place] [facility];  Service: Cardiovascular;  Laterality: Bilateral;   TOTAL THYROIDECTOMY  12/19/2016   TOTAL THYROIDECTOMY Bilateral 12/19/2016   [facility]   Family History  Problem Relation Age of Onset   Alcohol abuse Mother    Thyroid disease Mother    Hypertension Mother    COPD Mother    Cancer Father        liposarcoma   Congestive Heart Failure Father        V 61   Depression Father    Hypertension Father    Heart attack Father    Arrhythmia Father    Heart failure Father    Cancer Maternal Uncle        Pancreatic cancer   Heart disease Paternal Grandfather 51       MI   Diabetes Other    Cancer Sister        breast   Breast cancer Sister 47   Cancer Brother    Liver cancer Brother    Social History   Tobacco Use   Smoking status: Never Smoker   Smokeless tobacco: Never Used  Substance Use Topics   Alcohol use: No   Drug use: No     Office Visit from 04/25/2018 in [facility]  AUDIT-C Score  0      Interim medical history since last visit reviewed. Allergies and medications reviewed  Review of Systems Per HPI unless specifically indicated above     Objective:    LMP 08/26/2012 (Exact Date)   Wt Readings from Last 3 Encounters:   03/13/18 217 lb 1.6 oz (98.5 kg)  02/06/18 223 lb (101.2 kg)  08/26/17 244 lb (110.7 kg)    Physical Exam Constitutional:      Appearance: She is obese.  Pulmonary:     Effort: No tachypnea, accessory muscle usage, respiratory distress or retractions.     Comments: Coughed during visit; did not appear to have labored breathing; was able to drink water from a water bottle; able to speak in full sentences Neurological:     Mental Status: She is alert.  Psychiatric:        Mood and Affect: Mood is depressed. Affect is tearful.    Results for orders placed or performed in visit on 03/13/18  Culture, Group A Strep  Result Value Ref Range   MICRO NUMBER: [id number]    SPECIMEN QUALITY: Adequate    SOURCE: THROAT    STATUS: FINAL    RESULT: No group A Streptococcus isolated       Assessment & Plan:   Problem List Items Addressed This Visit    None    Visit Diagnoses    Sore throat    -  Primary   patient did not agree to continue the visit; advised her to go to urgent care or ER   Cough       patient did not agree to continue the visit; advised her to go to urgent care of ER   Suicidal ideation       pt did not agree to continue the visit, but I assessed for her safety; she denies plan, says she would not do anything; psychiatrist aware; get help if needed       Follow up plan: No follow-ups on file.  An after-visit summary was printed and given to the patient at [facility].  Please see the patient instructions which may contain other information and recommendations beyond what is mentioned above in the assessment and plan.  No orders of the defined types were placed in this encounter.   No orders of the defined types were placed in this encounter.

## 2018-04-25 NOTE — Telephone Encounter (Signed)
Pt called back stating that she was supposed to get a call back from office so that an appointment could be scheduled; conference call initiated with Miel at [facility], per [facility] pt offered and accepted virtual visit (facetime via Iphone) with Dr Sanda Klein, 04/25/2018 at 1400;the pt verbalized understanding; will route to ofifce for understanding.

## 2018-04-25 NOTE — Telephone Encounter (Signed)
Appt scheduled for today.

## 2018-04-25 NOTE — ED Provider Notes (Signed)
[facility]    CSN: [id number] Arrival date & time: 04/25/18  1653     History   Chief Complaint Chief Complaint  Patient presents with  . Cough  . Shortness of Breath  . Fever    HPI Robin Howard is a 58 y.o. female.   The history is provided by the patient.  Cough  Associated symptoms: fever, headaches, myalgias, rhinorrhea, shortness of breath and sore throat   Associated symptoms: no wheezing   Shortness of Breath  Associated symptoms: cough, fever, headaches and sore throat   Associated symptoms: no wheezing   Fever  Associated symptoms: congestion, cough, headaches, myalgias, rhinorrhea and sore throat   URI  Presenting symptoms: congestion, cough, fever, rhinorrhea and sore throat   Severity:  Moderate Onset quality:  Sudden Duration:  1 month Timing:  Intermittent Progression:  Unchanged Chronicity:  New Relieved by:  None tried Ineffective treatments:  None tried Associated symptoms: headaches and myalgias   Associated symptoms: no wheezing   Risk factors: no recent travel and no sick contacts     Past Medical History:  Diagnosis Date  . Anemia    h/o  . Breast cancer ([place]) 07/2014   T1c,N0, ER/ PR negative, Her 2 neu positive. Wide excision, SLN, Mammosite, adjuvant chemotherapy.   . Bronchitis 04-2014  . Coronary artery disease, non-occlusive    a. LHC 4/16 w/ mild nonobstructive disease affecting the LAD with tortuous vessels. CTA in 2017 showed only 30% disease in the LAD. bCarlton Adam MV 6/18: neg for signficant ischemia, EF 54%, low risk scan  . Detached retina 1997  . Family history of adverse reaction to anesthesia    pts dad has pseudocholinestrace deficieny  . Gastritis   . GERD (gastroesophageal reflux disease)   . History of nuclear stress test    a. MV 6/18: no significant ischemia, normal wall motion, EF 54%. Low risk scan  . HTN (hypertension)   . Hx of papillary thyroid carcinoma 02/05/2017   Dec 2018; T3aN0Mx  s/p total  thyroidectomy Dec 19, 2016, s/p I-131 RAI 01/02/17  . Hypothyroidism   . Insomnia   . Last menstrual period (LMP) > 10 days ago    LMP 2014  . Major depressive disorder, recurrent ([place])   . Migraines   . Morbid obesity ([place])   . Orthostatic hypotension    a. TTE 1/19:  EF 55-60%, normal wall motion, grade 2 diastolic dysfunction, mild aortic regurgitation, mildly calcified mitral annulus, trivial pericardial effusion was identified  . OSA (obstructive sleep apnea)    untreated due to lack of insurance  . Overactive bladder   . Pericarditis 1997  . Personal history of chemotherapy   . Personal history of radiation therapy    Mammosite  . Thyroid cancer ([place]) 12/2016   [facility]  . Thyroid nodule 10/24/2016   4.7 cm, October 2018; referred for biopsy    Patient Active Problem List   Diagnosis Date Noted  . Allergic rhinitis 03/20/2018  . Cervical cancer screening 03/20/2018  . Right retinal detachment 01/02/2018  . Breast nodule 06/21/2017  . Sleep disturbance 05/16/2017  . Hx of papillary thyroid carcinoma 02/05/2017  . 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 Thyroid cancer ([place]) 01/02/2017  . Lumbar radiculopathy 10/29/2016  . Medication monitoring encounter 08/22/2016  . Cervical spondylosis with radiculopathy 07/23/2016  . Perioral dermatitis 05/18/2016  . Obesity (BMI 35.0-39.9 without comorbidity) 03/04/2016  . Hypocalcemia 09/22/2015  . Coronary artery disease   . Headache  03/30/2015  . NSTEMI (non-ST elevated myocardial infarction) ([place]) 01/25/2015  . Angina pectoris ([place]) 01/24/2015  . Noninfective gastroenteritis and colitis, unspecified 09/23/2014  . Breast cancer of lower-outer quadrant of left female breast ([place]) 08/10/2014  . Migraine 07/15/2014  . Fatigue 07/15/2014  . Major depressive disorder, recurrent ([place])   . HTN (hypertension)   . Overactive bladder   . Hypothyroidism   . OSA (obstructive sleep apnea)   . GERD (gastroesophageal reflux disease)   .  Chronic headache   . Morbid obesity ([place])     Past Surgical History:  Procedure Laterality Date  . BACK SURGERY     L4-5  . BREAST BIOPSY Left 07/2014   [facility]  . BREAST CYST ASPIRATION Left 12/13/2014   3:00 position 2 cmfn, fat necrosis per Dr. Dwyane Luo notes  . BREAST LUMPECTOMY WITH SENTINEL LYMPH NODE BIOPSY Left 08/13/2014   Procedure: BREAST LUMPECTOMY WITH SENTINEL LYMPH NODE BIOPSY;  Surgeon: Robert Bellow, MD;  Location: [facility] OR[facility];  Service: General;  Laterality: Left;  . BREAST MAMMOSITE Left 08/30/2014   Procedure: MAMMOSITE BREAST;  Surgeon: Robert Bellow, MD;  Location: [facility];  Service: General;  Laterality: Left;  . CESAREAN SECTION    . COLONOSCOPY WITH PROPOFOL N/A 04/18/2016   Procedure: COLONOSCOPY WITH PROPOFOL;  Surgeon: Robert Bellow, MD;  [facility] END[facility]COPY;  Service: Endoscopy;  Laterality: N/A;  . CORONARY ANGIOPLASTY  April 2016  . EYE SURGERY Left 10/16/2015   retina repair  . FOOT SURGERY    . FORAMINOTOMY 1 LEVEL  [id number]  . KNEE SURGERY    . LAMINECTOMY  [id number]  . PORTACATH PLACEMENT Right 08/30/2014   Procedure: INSERTION PORT-A-CATH;  Surgeon: Robert Bellow, MD;  Location: [facility] ORS;  Service: General;  Laterality: Right;  . POSTERIOR LAMINECTOMY / DECOMPRESSION LUMBAR SPINE  10/29/2016   L3-5  . RETINAL DETACHMENT SURGERY Right 1997  . RETINAL DETACHMENT SURGERY  01/01/2018  . RIGHT/LEFT HEART CATH AND CORONARY ANGIOGRAPHY Bilateral 02/11/2017   Procedure: RIGHT/LEFT HEART CATH AND CORONARY ANGIOGRAPHY;  Surgeon: Wellington Hampshire, MD;  Location: [place] [facility];  Service: Cardiovascular;  Laterality: Bilateral;  . TOTAL THYROIDECTOMY  12/19/2016  . TOTAL THYROIDECTOMY Bilateral 12/19/2016   [facility]    OB History   No obstetric history on file.      Home Medications    Prior to Admission medications   Medication Sig Start Date End Date Taking? Authorizing Provider  acetaminophen (TYLENOL) 500 MG tablet Take 2 tablets  (1,000 mg total) every 6 (six) hours as needed by mouth for mild pain or headache. Maximum dose of acetaminophen 3,000 mg total per day 11/20/16  Yes Lada, Satira Anis, MD  Ascorbic Acid (VITAMIN C) 100 MG tablet Take 100 mg by mouth daily.   Yes [provider]  aspirin EC 81 MG tablet Take 81 mg by mouth daily.   Yes [provider]  buPROPion (WELLBUTRIN SR) 150 MG 12 hr tablet Take 300 mg by mouth daily.    Yes [provider]  cholecalciferol (VITAMIN D3) 25 MCG (1000 UT) tablet Take 1,000 Units by mouth daily.   Yes [provider]  cyclobenzaprine (FLEXERIL) 5 MG tablet Take 5 mg by mouth 3 (three) times daily as needed for muscle spasms.  10/29/16  Yes [provider]  DULoxetine (CYMBALTA) 60 MG capsule Take 1 capsule (60 mg total) by mouth daily. 08/22/16  Yes Lada, Satira Anis, MD  fluticasone (FLONASE) 50 MCG/ACT  nasal spray Place 2 sprays into both nostrils daily. 03/13/18  Yes Lada, Satira Anis, MD  LAMOTRIGINE PO Take 10 mg by mouth daily.   Yes [provider]  levothyroxine (SYNTHROID, LEVOTHROID) 150 MCG tablet Take 150 mcg by mouth daily before breakfast.   Yes [provider]  meloxicam (MOBIC) 7.5 MG tablet Take 7.5 mg by mouth daily.   Yes [provider]  metFORMIN (GLUCOPHAGE) 500 MG tablet Take 500 mg by mouth daily with breakfast.   Yes [provider]  metoprolol tartrate (LOPRESSOR) 25 MG tablet TAKE ONE TABLET BY MOUTH TWICE A DAY 12/17/17  Yes Dunn, Areta Haber, PA-C  Multiple Vitamins-Iron (MULTIVITAMIN/IRON PO) Take by mouth.   Yes [provider]  omeprazole (PRILOSEC) 20 MG capsule TAKE ONE CAPSULE BY MOUTH DAILY 04/23/18  Yes Lada, Satira Anis, MD  traZODone (DESYREL) 100 MG tablet Take 150 mg by mouth at bedtime.  11/20/16  Yes Lada, Satira Anis, MD  vitamin B-12 (CYANOCOBALAMIN) 100 MCG tablet Take 100 mcg by mouth daily.   Yes [provider]  ARIPiprazole (ABILIFY) 5 MG tablet Take 5 mg  by mouth daily.    [provider]  chlorpheniramine-HYDROcodone (TUSSIONEX PENNKINETIC ER) 10-8 MG/5ML SUER Take 5 mLs by mouth every 12 (twelve) hours as needed. 04/25/18   Norval Gable, MD  neomycin-polymyxin b-dexamethasone (MAXITROL) 3.5-10000-0.1 OINT Administer 1 application to the right eye 4 (four) times a day as needed. 01/02/18   [provider]  nystatin cream (MYCOSTATIN) Apply 1 application topically 2 (two) times daily. Do not get inside mouth 03/13/18   Lada, Satira Anis, MD  ofloxacin (OCUFLOX) 0.3 % ophthalmic solution Use one drop  in operative eye four times per day for two weeks 01/01/18   [provider]  prednisoLONE acetate (PRED FORTE) 1 % ophthalmic suspension Use one drop in operative eye four times per day for 1 month 01/01/18   [provider]  simvastatin (ZOCOR) 10 MG tablet TAKE ONE TABLET BY MOUTH EVERY NIGHT AT BEDTIME 10/02/17   Wellington Hampshire, MD    Family History Family History  Problem Relation Age of Onset  . Alcohol abuse Mother   . Thyroid disease Mother   . Hypertension Mother   . COPD Mother   . Cancer Father        liposarcoma  . Congestive Heart Failure Father        V Tach  . Depression Father   . Hypertension Father   . Heart attack Father   . Arrhythmia Father   . Heart failure Father   . Cancer Maternal Uncle        Pancreatic cancer  . Heart disease Paternal Grandfather 80       MI  . Diabetes Other   . Cancer Sister        breast  . Breast cancer Sister 69  . Cancer Brother   . Liver cancer Brother     Social History Social History   Tobacco Use  . Smoking status: Never Smoker  . Smokeless tobacco: Never Used  Substance Use Topics  . Alcohol use: No  . Drug use: No     Allergies   Reglan [metoclopramide]; Paxil [paroxetine hcl]; Tape; Tegaderm ag mesh [silver]; and Ultram [tramadol]   Review of Systems Review of Systems  Constitutional: Positive for fever.  HENT: Positive for  congestion, rhinorrhea and sore throat.   Respiratory: Positive for cough and shortness of breath. Negative for wheezing.  Musculoskeletal: Positive for myalgias.  Neurological: Positive for headaches.     Physical Exam Triage Vital Signs ED Triage Vitals  Enc Vitals Group     BP 04/25/18 1714 129/69     Pulse Rate 04/25/18 1714 78     Resp 04/25/18 1714 (!) 22     Temp 04/25/18 1714 98.3 F (36.8 C)     Temp Source 04/25/18 1714 Oral     SpO2 04/25/18 1714 100 %     Weight 04/25/18 1709 235 lb (106.6 kg)     Height 04/25/18 1709 5' 5.5" (1.664 m)     Head Circumference --      Peak Flow --      Pain Score 04/25/18 1709 5     Pain Loc --      Pain Edu? --      Excl. in [place]? --    No data found.  Updated Vital Signs BP 129/69 (BP Location: Left Arm)   Pulse 78   Temp 98.3 F (36.8 C) (Oral)   Resp (!) 22   Ht 5' 5.5" (1.664 m)   Wt 106.6 kg   LMP 08/26/2012 (Exact Date)   SpO2 100%   BMI 38.51 kg/m   Visual Acuity Right Eye Distance:   Left Eye Distance:   Bilateral Distance:    Right Eye Near:   Left Eye Near:    Bilateral Near:     Physical Exam Vitals signs and nursing note reviewed.  Constitutional:      General: She is not in acute distress.    Appearance: She is well-developed. She is not toxic-appearing or diaphoretic.  HENT:     Head: Normocephalic and atraumatic.     Nose: Congestion present.  Neck:     Thyroid: No thyromegaly.  Cardiovascular:     Rate and Rhythm: Normal rate.  Pulmonary:     Effort: Pulmonary effort is normal. No respiratory distress.     Breath sounds: No stridor. No wheezing.  Lymphadenopathy:     Cervical: No cervical adenopathy.  Neurological:     Mental Status: She is alert.      UC Treatments / Results  Labs (all labs ordered are listed, but only abnormal results are displayed) Labs Reviewed - No data to display  EKG None  Radiology Dg Chest 2 View  Result Date: 04/25/2018 CLINICAL DATA:  Cough, sore  throat, body aches. EXAM: CHEST - 2 VIEW COMPARISON:  Chest x-ray dated 03/13/2018. FINDINGS: The heart size and mediastinal contours are within normal limits. Both lungs are clear. The visualized skeletal structures are unremarkable. IMPRESSION: No active cardiopulmonary disease. No evidence of pneumonia or pulmonary edema. Electronically Signed   By: Franki Cabot M.D.   On: 04/25/2018 18:21    Procedures Procedures (including critical care time)  Medications Ordered in UC Medications - No data to display  Initial Impression / Assessment and Plan / UC Course  I have reviewed the triage vital signs and the nursing notes.  Pertinent labs & imaging results that were available during my care of the patient were reviewed by me and considered in my medical decision making (see chart for details).      Final Clinical Impressions(s) / UC Diagnoses   Final diagnoses:  Viral URI with cough     Discharge Instructions     Rest, over the counter medications, fluids Recommend follow [place] DHHS guidelines for self quarantine    ED Prescriptions    Medication Sig Dispense  Auth. Provider   chlorpheniramine-HYDROcodone (TUSSIONEX PENNKINETIC ER) 10-8 MG/5ML SUER Take 5 mLs by mouth every 12 (twelve) hours as needed. 60 mL Norval Gable, MD     1. x-ray result (negative/normal) and diagnosis reviewed with patient 2. rx as per orders above; reviewed possible side effects, interactions, risks and benefits  3. Recommend supportive treatment as above 4. Follow-up prn if symptoms worsen or don't improve Controlled Substance Prescriptions [place] Controlled Substance Registry consulted? Not Applicable   Norval Gable, MD 04/25/18 [phone]

## 2018-04-25 NOTE — Discharge Instructions (Signed)
Rest, over the counter medications, fluids Recommend follow [place] DHHS guidelines for self quarantine

## 2018-06-06 ENCOUNTER — Other Ambulatory Visit: Payer: Self-pay | Admitting: Family Medicine

## 2018-06-16 ENCOUNTER — Ambulatory Visit
Admission: RE | Admit: 2018-06-16 | Discharge: 2018-06-16 | Disposition: A | Discharge status: Home / Self Care | Payer: Medicaid Other | Source: Ambulatory Visit | Attending: Oncology | Admitting: Oncology

## 2018-06-16 ENCOUNTER — Other Ambulatory Visit: Payer: Self-pay

## 2018-06-16 DIAGNOSIS — C50512 Malignant neoplasm of lower-outer quadrant of left female breast: Secondary | ICD-10-CM | POA: Insufficient documentation

## 2018-06-16 DIAGNOSIS — Z170 Estrogen receptor positive status [ER+]: Secondary | ICD-10-CM

## 2018-07-10 ENCOUNTER — Telehealth: Payer: Self-pay | Admitting: Hematology

## 2018-07-10 DIAGNOSIS — Z20822 Contact with and (suspected) exposure to covid-19: Secondary | ICD-10-CM

## 2018-07-10 NOTE — Telephone Encounter (Signed)
Patient showed up to testing site and was told she needed an appointment and order.  Referred by [facility] / Contacted patient and scheduled for tomorrow.  Order placed /

## 2018-07-11 ENCOUNTER — Other Ambulatory Visit: Payer: Medicaid Other

## 2018-07-11 DIAGNOSIS — Z20822 Contact with and (suspected) exposure to covid-19: Secondary | ICD-10-CM

## 2018-07-17 LAB — NOVEL CORONAVIRUS, NAA: SARS-CoV-2, NAA: NOT DETECTED

## 2018-08-03 NOTE — Progress Notes (Deleted)
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  She continues to feel well and is asymptomatic. She has no neurologic complaints.  She denies any recent fevers or illnesses.  She has a good appetite and denies weight loss.  She has no chest pain or shortness of breath. She denies any nausea, vomiting, constipation, or diarrhea. She has no urinary complaints.  Patient feels at her baseline offers no specific complaints today.  REVIEW OF SYSTEMS:   Review of Systems  Constitutional: Negative.  Negative for fever, malaise/fatigue and weight loss.  HENT: Negative.  Negative for congestion and sore throat.   Eyes: Negative for blurred vision and pain.  Respiratory: Negative.  Negative for cough and shortness of breath.   Cardiovascular: Negative.  Negative for chest pain and leg swelling.  Gastrointestinal: Negative.  Negative for abdominal pain, diarrhea and nausea.  Genitourinary: Negative.   Musculoskeletal: Negative.  Negative for back pain and joint pain.  Skin: Negative.  Negative for rash.  Neurological: Negative.  Negative for sensory change, focal weakness, weakness and headaches.  Psychiatric/Behavioral: Negative.  Negative for depression. The patient is not nervous/anxious.      As per HPI. Otherwise, a complete review of systems is negative.  PAST MEDICAL HISTORY: Past Medical History:  Diagnosis Date  . Anemia    h/o  . Breast cancer ([place] 07/2014   T1c,N0, ER/ PR negative, Her 2 neu positive. Wide excision, SLN, Mammosite, adjuvant chemotherapy.   . Bronchitis 04-2014  . Coronary artery disease, non-occlusive    a. LHC 4/16 w/ mild nonobstructive disease affecting the LAD with tortuous vessels. CTA in 2017 showed only 30% disease in the LAD. b.Carlton AdamMV 6/18: neg for signficant ischemia, EF 54%, low risk scan  . Detached retina 1997  . Family history of adverse reaction to anesthesia    pts dad has pseudocholinestrace deficieny  . Gastritis   . GERD (gastroesophageal reflux disease)   . History of nuclear stress test    a. MV 6/18: no significant ischemia, normal wall motion, EF 54%. Low risk scan  . HTN (hypertension)   . Hx of papillary thyroid carcinoma 02/05/2017   Dec 2018; T3aN0Mx  s/p total thyroidectomy Dec 19, 2016, s/p I-131 RAI 01/02/17  . Hypothyroidism   . Insomnia   . Last menstrual period (LMP) > 10 days ago    LMP 2014  . Major depressive disorder, recurrent ([place]   . Migraines   . Morbid obesity ([place]   . Orthostatic hypotension    a. TTE 1/19:  EF 55-60%, normal wall motion, grade 2 diastolic dysfunction, mild aortic regurgitation, mildly calcified mitral annulus, trivial pericardial effusion was identified  . OSA (obstructive sleep apnea)    untreated due to lack of insurance  . Overactive bladder   . Pericarditis 1997  . Personal history of chemotherapy   .  Personal history of radiation therapy    Mammosite  . Thyroid cancer ([place]) 12/2016   [facility]  . Thyroid nodule 10/24/2016   4.7 cm, October 2018; referred for biopsy    PAST SURGICAL HISTORY: Past Surgical History:  Procedure Laterality Date  . BACK SURGERY     L4-5  . BREAST BIOPSY Left 07/2014   [facility]  . BREAST CYST ASPIRATION Left 12/13/2014   3:00 position 2  cmfn, fat necrosis per Dr. Dwyane Luo notes  . BREAST LUMPECTOMY Left 2016   Invasive mammary and DCIS neg margins  . BREAST LUMPECTOMY WITH SENTINEL LYMPH NODE BIOPSY Left 08/13/2014   Procedure: BREAST LUMPECTOMY WITH SENTINEL LYMPH NODE BIOPSY;  Surgeon: Robert Bellow, MD;  Location: [facility] ORS;  Service: General;  Laterality: Left;  . BREAST MAMMOSITE Left 08/30/2014   Procedure: MAMMOSITE BREAST;  Surgeon: Robert Bellow, MD;  Location: [facility] ORS;  Service: General;  Laterality: Left;  . CESAREAN SECTION    . COLONOSCOPY WITH PROPOFOL N/A 04/18/2016   Procedure: COLONOSCOPY WITH PROPOFOL;  Surgeon: Robert Bellow, MD;  Location: [facility] ENDOSCOPY;  Service: Endoscopy;  Laterality: N/A;  . CORONARY ANGIOPLASTY  April 2016  . EYE SURGERY Left 10/16/2015   retina repair  . FOOT SURGERY    . FORAMINOTOMY 1 LEVEL  [id number]  . KNEE SURGERY    . LAMINECTOMY  [id number]  . PORTACATH PLACEMENT Right 08/30/2014   Procedure: INSERTION PORT-A-CATH;  Surgeon: Robert Bellow, MD;  Location: [facility] ORS;  Service: General;  Laterality: Right;  . POSTERIOR LAMINECTOMY / DECOMPRESSION LUMBAR SPINE  10/29/2016   L3-5  . RETINAL DETACHMENT SURGERY Right 1997  . RETINAL DETACHMENT SURGERY  01/01/2018  . RIGHT/LEFT HEART CATH AND CORONARY ANGIOGRAPHY Bilateral 02/11/2017   Procedure: RIGHT/LEFT HEART CATH AND CORONARY ANGIOGRAPHY;  Surgeon: Wellington Hampshire, MD;  Location: [place] CV LAB;  Service: Cardiovascular;  Laterality: Bilateral;  . TOTAL THYROIDECTOMY  12/19/2016  . TOTAL THYROIDECTOMY Bilateral 12/19/2016   [facility]    FAMILY HISTORY Family History  Problem Relation Age of Onset  . Alcohol abuse Mother   . Thyroid disease Mother   . Hypertension Mother   . COPD Mother   . Cancer Father        liposarcoma  . Congestive Heart Failure Father        V Tach  . Depression Father   . Hypertension Father   . Heart attack Father   . Arrhythmia Father   . Heart failure Father   . Cancer  Maternal Uncle        Pancreatic cancer  . Heart disease Paternal Grandfather 76       MI  . Diabetes Other   . Cancer Sister        breast  . Breast cancer Sister 23  . Cancer Brother   . Liver cancer Brother        ADVANCED DIRECTIVES:    HEALTH MAINTENANCE: Social History   Tobacco Use  . Smoking status: Never Smoker  . Smokeless tobacco: Never Used  Substance Use Topics  . Alcohol use: No  . Drug use: No    Allergies  Allergen Reactions  . Reglan [Metoclopramide] Shortness Of Breath and Other (See Comments)    This medication caused a dystonic reaction.    Marland Kitchen Paxil [Paroxetine Hcl] Other (See Comments)    Reaction:  Blurred vision  . Tape Rash  . Tegaderm Ag Mesh [Silver] Rash and Other (See Comments)  Only if on skin for a long time  . Ultram [Tramadol] Itching and Rash    Current Outpatient Medications  Medication Sig Dispense Refill  . acetaminophen (TYLENOL) 500 MG tablet Take 2 tablets (1,000 mg total) every 6 (six) hours as needed by mouth for mild pain or headache. Maximum dose of acetaminophen 3,000 mg total per day    . ARIPiprazole (ABILIFY) 5 MG tablet Take 5 mg by mouth daily.    . Ascorbic Acid (VITAMIN C) 100 MG tablet Take 100 mg by mouth daily.    Marland Kitchen aspirin EC 81 MG tablet Take 81 mg by mouth daily.    Marland Kitchen buPROPion (WELLBUTRIN SR) 150 MG 12 hr tablet Take 300 mg by mouth daily.     . chlorpheniramine-HYDROcodone (TUSSIONEX PENNKINETIC ER) 10-8 MG/5ML SUER Take 5 mLs by mouth every 12 (twelve) hours as needed. 60 mL 0  . cholecalciferol (VITAMIN D3) 25 MCG (1000 UT) tablet Take 1,000 Units by mouth daily.    . cyclobenzaprine (FLEXERIL) 5 MG tablet Take 5 mg by mouth 3 (three) times daily as needed for muscle spasms.     . DULoxetine (CYMBALTA) 60 MG capsule Take 1 capsule (60 mg total) by mouth daily. 30 capsule 1  . fluticasone (FLONASE) 50 MCG/ACT nasal spray Place 2 sprays into both nostrils daily. 16 g 6  . LAMOTRIGINE PO Take 10 mg by mouth  daily.    Marland Kitchen levothyroxine (SYNTHROID, LEVOTHROID) 150 MCG tablet Take 150 mcg by mouth daily before breakfast.    . meloxicam (MOBIC) 7.5 MG tablet Take 7.5 mg by mouth daily.    . metFORMIN (GLUCOPHAGE) 500 MG tablet Take 500 mg by mouth daily with breakfast.    . metoprolol tartrate (LOPRESSOR) 25 MG tablet TAKE ONE TABLET BY MOUTH TWICE A DAY 180 tablet 1  . Multiple Vitamins-Iron (MULTIVITAMIN/IRON PO) Take by mouth.    . neomycin-polymyxin b-dexamethasone (MAXITROL) 3.5-10000-0.1 OINT Administer 1 application to the right eye 4 (four) times a day as needed.    . nystatin cream (MYCOSTATIN) Apply 1 application topically 2 (two) times daily. Do not get inside mouth 30 g 0  . ofloxacin (OCUFLOX) 0.3 % ophthalmic solution Use one drop  in operative eye four times per day for two weeks    . omeprazole (PRILOSEC) 20 MG capsule TAKE ONE CAPSULE BY MOUTH DAILY 90 capsule 0  . prednisoLONE acetate (PRED FORTE) 1 % ophthalmic suspension Use one drop in operative eye four times per day for 1 month    . simvastatin (ZOCOR) 10 MG tablet TAKE ONE TABLET BY MOUTH EVERY NIGHT AT BEDTIME 30 tablet 11  . traZODone (DESYREL) 100 MG tablet Take 150 mg by mouth at bedtime.     . vitamin B-12 (CYANOCOBALAMIN) 100 MCG tablet Take 100 mcg by mouth daily.     No current facility-administered medications for this visit.     OBJECTIVE: There were no vitals filed for this visit.   There is no height or weight on file to calculate BMI.    ECOG FS:0 - Asymptomatic  General: Well-developed, well-nourished, no acute distress. Eyes: Pink conjunctiva, anicteric sclera. HEENT: Normocephalic, moist mucous membranes. Breast: Bilateral breast and axilla without lumps or masses. Lungs: Clear to auscultation bilaterally. Heart: Regular rate and rhythm. No rubs, murmurs, or gallops. Abdomen: Soft, nontender, nondistended. No organomegaly noted, normoactive bowel sounds. Musculoskeletal: No edema, cyanosis, or clubbing.  Neuro: Alert, answering all questions appropriately. Cranial nerves grossly intact. Skin: No rashes or petechiae  noted. Psych: Normal affect.  LAB RESULTS:  Lab Results  Component Value Date   NA 139 02/07/2017   K 3.8 02/07/2017   CL 103 02/07/2017   CO2 29 02/07/2017   GLUCOSE 103 (H) 02/07/2017   BUN 11 02/07/2017   CREATININE 1.21 (H) 02/07/2017   CALCIUM 9.0 02/07/2017   PROT 6.6 01/22/2017   ALBUMIN 4.0 01/22/2017   AST 40 01/22/2017   ALT 25 01/22/2017   ALKPHOS 89 01/22/2017   BILITOT 0.3 01/22/2017   GFRNONAA 49 (L) 02/07/2017   GFRAA 57 (L) 02/07/2017    Lab Results  Component Value Date   WBC 5.5 02/07/2017   NEUTROABS 3.7 01/22/2017   HGB 11.5 (L) 02/07/2017   HCT 35.1 02/07/2017   MCV 89.9 02/07/2017   PLT 224 02/07/2017     STUDIES: No results found.  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  PLAN:    1. 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  She received adjuvant chemotherapy with Taxotere, carboplatin, and Herceptin, but treatment was discontinued after 3 cycles on November 02, 2014 secondary to persistent side effects.  She completed her year-long maintenance Herceptin on October 04, 2015.  Patient underwent breast biopsy on June 25, 2017 for an abnormality noted on mammogram that was reported as benign.  She will require repeat mammogram in May or June 2019.  Return to clinic in 6 months for routine evaluation.   2. Thyroid cancer: Patient completed thyroidectomy and radioactive iodine ablation in the spring 2019.  Continue current dose of Synthroid.  Follow-up with [facility] endocrinology as scheduled.  I spent a total of 20 minutes face-to-face with the patient of which greater than 50% of the visit was spent in counseling and coordination of care as detailed above.    Patient expressed understanding and was in agreement with this plan. She also understands that She can call clinic at any time with any questions, concerns, or complaints.   Breast cancer, female   Staging form: Breast, AJCC 7th Edition     Pathologic stage from 08/17/2014: Stage IA (T1c, N0, cM0) - Signed by Lloyd Huger, MD on 08/17/2014  Lloyd Huger, MD   08/03/2018 8:41 AM

## 2018-08-06 ENCOUNTER — Other Ambulatory Visit: Payer: Self-pay

## 2018-08-06 DIAGNOSIS — C50512 Malignant neoplasm of lower-outer quadrant of left female breast: Secondary | ICD-10-CM

## 2018-08-07 ENCOUNTER — Telehealth: Payer: Self-pay | Admitting: *Deleted

## 2018-08-07 NOTE — Telephone Encounter (Signed)
Per pt request to cancel her 08/08/18 lab/MD appt. She will call back at a later date [place]

## 2018-08-08 ENCOUNTER — Inpatient Hospital Stay: Payer: Self-pay

## 2018-08-08 ENCOUNTER — Inpatient Hospital Stay: Payer: Self-pay | Admitting: Oncology

## 2018-11-27 ENCOUNTER — Other Ambulatory Visit: Payer: Self-pay

## 2018-11-27 ENCOUNTER — Encounter: Payer: Self-pay | Admitting: Cardiovascular Disease

## 2018-11-27 ENCOUNTER — Ambulatory Visit (INDEPENDENT_AMBULATORY_CARE_PROVIDER_SITE_OTHER): Payer: Self-pay | Admitting: Cardiovascular Disease

## 2018-11-27 VITALS — BP 124/88 | HR 72 | Ht 65.5 in | Wt 229.8 lb

## 2018-11-27 DIAGNOSIS — I2510 Atherosclerotic heart disease of native coronary artery without angina pectoris: Secondary | ICD-10-CM

## 2018-11-27 DIAGNOSIS — I10 Essential (primary) hypertension: Secondary | ICD-10-CM

## 2018-11-27 DIAGNOSIS — E785 Hyperlipidemia, unspecified: Secondary | ICD-10-CM

## 2018-11-27 MED ORDER — SIMVASTATIN 10 MG PO TABS: 10.0000 mg | ORAL_TABLET | Freq: Every day | ORAL | 3 refills | Status: DC

## 2018-11-27 MED ORDER — METOPROLOL TARTRATE 25 MG PO TABS: 25.0000 mg | ORAL_TABLET | Freq: Two times a day (BID) | ORAL | 3 refills | Status: DC

## 2018-11-27 NOTE — Patient Instructions (Signed)
Medication Instructions:  Your physician recommends that you continue on your current medications as directed. Please refer to the Current Medication list given to you today.  *If you need a refill on your cardiac medications before your next appointment, please call your pharmacy*  Lab Work: None ordered If you have labs (blood work) drawn today and your tests are completely normal, you will receive your results only by: . MyChart Message (if you have MyChart) OR . A paper copy in the mail If you have any lab test that is abnormal or we need to change your treatment, we will call you to review the results.  Testing/Procedures: None ordered  Follow-Up: At [facility], you and your health needs are our priority.  As part of our continuing mission to provide you with exceptional heart care, we have created designated Provider Care Teams.  These Care Teams include your primary Cardiologist (physician) and Advanced Practice Providers (APPs -  Physician Assistants and Nurse Practitioners) who all work together to provide you with the care you need, when you need it.  Your next appointment:   12 months  The format for your next appointment:   In Person  Provider:    You may see Dr. Arida or one of the following Advanced Practice Providers on your designated Care Team:    Christopher Berge, NP  Ryan Dunn, PA-C  Jacquelyn Visser, PA-C   Other Instructions N/A  

## 2018-11-27 NOTE — Progress Notes (Signed)
Cardiology Office Note   Date:  11/27/2018   ID:  Robin Howard, DOB October 24, 1960, MRN [id number]  PCP:  Arnetha Courser, MD  Cardiologist:   Kathlyn Sacramento, MD   No chief complaint on file.     History of Present Illness: Robin Howard is a 58 y.o. female who Is here today for a follow-up visit regarding mild nonobstructive coronary artery disease.  She had 2 episodes of chest pain with mildly elevated troponin without evidence of obstructive coronary artery disease. Initial presentation was in April 2016 in the setting of bronchitis. Cardiac catheterization showed mild nonobstructive LAD disease. Ejection fraction was normal. She was diagnosed with breast cancer in 2017 and underwent lumpectomy followed by chemotherapy.  Other medical problems include essential hypertension, hyperlipidemia, chronic back pain, thyroid cancer status post thyroidectomy with subsequent hypothyroidism, obesity and sleep apnea. She had worsening dyspnea in 2019 and underwent a right and left cardiac catheterization in January 2019.  This showed minor irregularities with no evidence of obstructive coronary artery disease, normal LV systolic function and normal right heart catheterization.  She is doing reasonably well with no recent chest pain or worsening dyspnea.  She is dealing with depression and getting treatment at [facility].  Past Medical History:  Diagnosis Date  . Anemia    h/o  . Breast cancer ([place]) 07/2014   T1c,N0, ER/ PR negative, Her 2 neu positive. Wide excision, SLN, Mammosite, adjuvant chemotherapy.   . Bronchitis 04-2014  . Coronary artery disease, non-occlusive    a. LHC 4/16 w/ mild nonobstructive disease affecting the LAD with tortuous vessels. CTA in 2017 showed only 30% disease in the LAD. bCarlton Adam MV 6/18: neg for signficant ischemia, EF 54%, low risk scan  . Detached retina 1997  . Family history of adverse reaction to anesthesia    pts dad has pseudocholinestrace deficieny  .  Gastritis   . GERD (gastroesophageal reflux disease)   . History of nuclear stress test    a. MV 6/18: no significant ischemia, normal wall motion, EF 54%. Low risk scan  . HTN (hypertension)   . Hx of papillary thyroid carcinoma 02/05/2017   Dec 2018; T3aN0Mx  s/p total thyroidectomy Dec 19, 2016, s/p I-131 RAI 01/02/17  . Hypothyroidism   . Insomnia   . Last menstrual period (LMP) > 10 days ago    LMP 2014  . Major depressive disorder, recurrent ([place])   . Migraines   . Morbid obesity ([place])   . Orthostatic hypotension    a. TTE 1/19:  EF 55-60%, normal wall motion, grade 2 diastolic dysfunction, mild aortic regurgitation, mildly calcified mitral annulus, trivial pericardial effusion was identified  . OSA (obstructive sleep apnea)    untreated due to lack of insurance  . Overactive bladder   . Pericarditis 1997  . Personal history of chemotherapy   . Personal history of radiation therapy    Mammosite  . Thyroid cancer ([place]) 12/2016   [facility]  . Thyroid nodule 10/24/2016   4.7 cm, October 2018; referred for biopsy    Past Surgical History:  Procedure Laterality Date  . BACK SURGERY     L4-5  . BREAST [facility]OPSY Left 07/2014   [facility]  . BREAST CYST ASPIRATION Left 12/13/2014   3:00 position 2 cmfn, fat necrosis per Dr. Dwyane Luo notes  . BREAST LUMPECTOMY Left 2016   Invasive mammary and DCIS neg margins  . BREAST LUMPECTOMY WITH SENTINEL LYMPH NODE BIOPSY Left 08/13/2014   Procedure:  BREAST LUMPECTOMY WITH SENTINEL LYMPH NODE BIOPSY;  Surgeon: Robert Bellow, MD;  Location: [facility] ORS;  Service: General;  Laterality: Left;  . BREAST MAMMOSITE Left 08/30/2014   Procedure: MAMMOSITE BREAST;  Surgeon: Robert Bellow, MD;  Location: [facility] ORS;  Service: General;  Laterality: Left;  . CESAREAN SECTION    . COLONOSCOPY WITH PROPOFOL N/A 04/18/2016   Procedure: COLONOSCOPY WITH PROPOFOL;  Surgeon: Robert Bellow, MD;  Location: [facility] ENDOSCOPY;  Service: Endoscopy;  Laterality: N/A;  .  CORONARY ANGIOPLASTY  April 2016  . EYE SURGERY Left 10/16/2015   retina repair  . FOOT SURGERY    . FORAMINOTOMY 1 LEVEL  [id number]  . KNEE SURGERY    . LAMINECTOMY  [id number]  . PORTACATH PLACEMENT Right 08/30/2014   Procedure: INSERTION PORT-A-CATH;  Surgeon: Robert Bellow, MD;  Location: [facility] ORS;  Service: General;  Laterality: Right;  . POSTERIOR LAMINECTOMY / DECOMPRESSION LUMBAR SPINE  10/29/2016   L3-5  . RETINAL DETACHMENT SURGERY Right 1997  . RETINAL DETACHMENT SURGERY  01/01/2018  . RIGHT/LEFT HEART CATH AND CORONARY ANGIOGRAPHY Bilateral 02/11/2017   Procedure: RIGHT/LEFT HEART CATH AND CORONARY ANGIOGRAPHY;  Surgeon: Wellington Hampshire, MD;  Location: [place] CV LAB;  Service: Cardiovascular;  Laterality: Bilateral;  . TOTAL THYROIDECTOMY  12/19/2016  . TOTAL THYROIDECTOMY Bilateral 12/19/2016   [facility]     Current Outpatient Medications  Medication Sig Dispense Refill  . acetaminophen (TYLENOL) 500 MG tablet Take 2 tablets (1,000 mg total) every 6 (six) hours as needed by mouth for mild pain or headache. Maximum dose of acetaminophen 3,000 mg total per day    . Ascorbic Acid (VITAMIN C) 100 MG tablet Take 100 mg by mouth daily.    Marland Kitchen aspirin EC 81 MG tablet Take 81 mg by mouth daily.    Marland Kitchen buPROPion (WELLBUTRIN SR) 150 MG 12 hr tablet Take 300 mg by mouth daily.     . cholecalciferol (VITAMIN D3) 25 MCG (1000 UT) tablet Take 1,000 Units by mouth daily.    . cyclobenzaprine (FLEXERIL) 5 MG tablet Take 5 mg by mouth 3 (three) times daily as needed for muscle spasms.     . DULoxetine (CYMBALTA) 60 MG capsule Take 1 capsule (60 mg total) by mouth daily. 30 capsule 1  . LAMOTRIGINE PO Take 10 mg by mouth daily.    Marland Kitchen levothyroxine (SYNTHROID, LEVOTHROID) 150 MCG tablet Take 150 mcg by mouth daily before breakfast.    . metoprolol tartrate (LOPRESSOR) 25 MG tablet Take 1 tablet (25 mg total) by mouth 2 (two) times daily. 180 tablet 3  . Multiple Vitamins-Iron  (MULTIVITAMIN/IRON PO) Take by mouth.    . neomycin-polymyxin b-dexamethasone (MAXITROL) 3.5-10000-0.1 OINT Administer 1 application to the right eye 4 (four) times a day as needed.    . nystatin cream (MYCOSTATIN) Apply 1 application topically 2 (two) times daily. Do not get inside mouth 30 g 0  . ofloxacin (OCUFLOX) 0.3 % ophthalmic solution Use one drop  in operative eye four times per day for two weeks    . omeprazole (PRILOSEC) 20 MG capsule TAKE ONE CAPSULE BY MOUTH DAILY 90 capsule 0  . prednisoLONE acetate (PRED FORTE) 1 % ophthalmic suspension Use one drop in operative eye four times per day for 1 month    . simvastatin (ZOCOR) 10 MG tablet Take 1 tablet (10 mg total) by mouth at bedtime. 90 tablet 3  . traZODone (DESYREL) 100 MG tablet Take 150 mg by mouth  at bedtime.     . vitamin B-12 (CYANOCOBALAMIN) 100 MCG tablet Take 100 mcg by mouth daily.     No current facility-administered medications for this visit.     Allergies:   Reglan [metoclopramide], Paxil [paroxetine hcl], Tape, Tegaderm ag mesh [silver], and Ultram [tramadol]    Social History:  The patient  reports that she has never smoked. She has never used smokeless tobacco. She reports that she does not drink alcohol or use drugs.   Family History:  The patient's family history includes Alcohol abuse in her mother; Arrhythmia in her father; Breast cancer (age of onset: 57) in her sister; COPD in her mother; Cancer in her brother, father, maternal uncle, and sister; Congestive Heart Failure in her father; Depression in her father; Diabetes in an other family member; Heart attack in her father; Heart disease (age of onset: 47) in her paternal grandfather; Heart failure in her father; Hypertension in her father and mother; Liver cancer in her brother; Thyroid disease in her mother.    ROS:  Please see the history of present illness.   Otherwise, review of systems are positive for none.   All other systems are reviewed and  negative.    PHYSICAL EXAM: VS:  BP 124/88   Pulse 72   Ht 5' 5.5" (1.664 m)   Wt 229 lb 12.8 oz (104.2 kg)   LMP 08/26/2012 (Exact Date)   BMI 37.66 kg/m  , BMI Body mass index is 37.66 kg/m. GEN: Well nourished, well developed, in no acute distress  HEENT: normal  Neck: no JVD, carotid bruits, or masses Cardiac: RRR; no murmurs, rubs, or gallops,no edema  Respiratory:  clear to auscultation bilaterally, normal work of breathing GI: soft, nontender, nondistended, + BS MS: no deformity or atrophy  Skin: warm and dry, no rash Neuro:  Strength and sensation are intact Psych: euthymic mood, full affect   EKG:  EKG is ordered today. The ekg ordered today demonstrates normal sinus rhythm with chronic anterolateral ST changes suggestive of ischemia.  Recent Labs: No results found for requested labs within last 8760 hours.    Lipid Panel    Component Value Date/Time   CHOL 162 08/22/2016 1033   CHOL 148 11/04/2015 0821   CHOL 132 04/17/2014 0444   TRIG 187 (H) 08/22/2016 1033   TRIG 115 04/17/2014 0444   HDL 47 (L) 08/22/2016 1033   HDL 43 11/04/2015 0821   HDL 37 (L) 04/17/2014 0444   CHOLHDL 3.4 08/22/2016 1033   VLDL 37 (H) 08/22/2016 1033   VLDL 23 04/17/2014 0444   LDLCALC 78 08/22/2016 1033   LDLCALC 75 11/04/2015 0821   LDLCALC 72 04/17/2014 0444      Wt Readings from Last 3 Encounters:  11/27/18 229 lb 12.8 oz (104.2 kg)  04/25/18 235 lb (106.6 kg)  03/13/18 217 lb 1.6 oz (98.5 kg)         ASSESSMENT AND PLAN:  1.  Coronary atherosclerosis without obstructive coronary artery disease: Currently with no anginal symptoms. Recommend continuing aggressive medical therapy. Continue low-dose aspirin.  2. Essential hypertension: Blood pressure is controlled on metoprolol  3. Hyperlipidemia: She was placed on simvastatin due to coronary atherosclerosis .   Lipid profile in 2019 showed an LDL of 46.    Disposition:   FU with me in 12 months  Signed,   Kathlyn Sacramento, MD  11/27/2018 8:38 AM    [place]

## 2019-06-05 ENCOUNTER — Ambulatory Visit: Payer: Medicare HMO

## 2019-06-06 ENCOUNTER — Ambulatory Visit: Payer: Medicare HMO | Attending: Internal Medicine

## 2019-06-06 ENCOUNTER — Other Ambulatory Visit: Payer: Self-pay

## 2019-06-06 DIAGNOSIS — Z23 Encounter for immunization: Secondary | ICD-10-CM

## 2019-06-06 NOTE — Progress Notes (Signed)
   Covid-19 Vaccination Clinic  Name:  Robin Howard    MRN: [id number] DOB: 1960/04/03  06/06/2019  [name] was observed post Covid-19 immunization for 30 minutes based on pre-vaccination screening without incident. She was provided with Vaccine Information Sheet and instruction to access the V-Safe system.   [name] was instructed to call 911 with any severe reactions post vaccine: Marland Kitchen Difficulty breathing  . Swelling of face and throat  . A fast heartbeat  . A bad rash all over body  . Dizziness and weakness   Immunizations Administered    Name Date Dose VIS Date Route   Pfizer COVID-19 Vaccine 06/06/2019  9:28 AM 0.3 mL 03/11/2018 Intramuscular   Manufacturer: [place]   Lot: [id number]   [place]: [id number]

## 2019-06-27 ENCOUNTER — Ambulatory Visit: Payer: Medicare HMO

## 2019-07-03 ENCOUNTER — Ambulatory Visit: Payer: Medicare HMO

## 2019-07-04 ENCOUNTER — Ambulatory Visit: Payer: Medicare HMO | Attending: Internal Medicine

## 2019-07-04 DIAGNOSIS — Z23 Encounter for immunization: Secondary | ICD-10-CM

## 2019-07-04 NOTE — Progress Notes (Signed)
   Covid-19 Vaccination Clinic  Name:  Robin Howard    MRN: [id number] DOB: 1960-10-24  07/04/2019  Robin Howard was observed post Covid-19 immunization for 30 minutes based on pre-vaccination screening without incident. She was provided with Vaccine Information Sheet and instruction to access the V-Safe system.   Robin Howard was instructed to call 911 with any severe reactions post vaccine: Marland Kitchen Difficulty breathing  . Swelling of face and throat  . A fast heartbeat  . A bad rash all over body  . Dizziness and weakness   Immunizations Administered    Name Date Dose VIS Date Route   Pfizer COVID-19 Vaccine 07/04/2019 10:06 AM 0.3 mL 03/11/2018 Intramuscular   Manufacturer: [place]   Lot: [id number]   [place]: [id number]

## 2019-11-27 ENCOUNTER — Other Ambulatory Visit: Payer: Self-pay | Admitting: Internal Medicine

## 2019-11-27 DIAGNOSIS — Z853 Personal history of malignant neoplasm of breast: Secondary | ICD-10-CM

## 2019-12-03 ENCOUNTER — Ambulatory Visit (INDEPENDENT_AMBULATORY_CARE_PROVIDER_SITE_OTHER): Payer: Medicare HMO | Admitting: Cardiovascular Disease

## 2019-12-03 ENCOUNTER — Encounter: Payer: Self-pay | Admitting: Cardiovascular Disease

## 2019-12-03 ENCOUNTER — Other Ambulatory Visit: Payer: Self-pay

## 2019-12-03 VITALS — BP 100/80 | HR 84 | Ht 65.5 in | Wt 226.0 lb

## 2019-12-03 DIAGNOSIS — I2510 Atherosclerotic heart disease of native coronary artery without angina pectoris: Secondary | ICD-10-CM

## 2019-12-03 DIAGNOSIS — I10 Essential (primary) hypertension: Secondary | ICD-10-CM

## 2019-12-03 DIAGNOSIS — E785 Hyperlipidemia, unspecified: Secondary | ICD-10-CM

## 2019-12-03 NOTE — Patient Instructions (Addendum)
Medication Instructions:  Your physician recommends that you continue on your current medications as directed. Please refer to the Current Medication list given to you today.  *If you need a refill on your cardiac medications before your next appointment, please call your pharmacy*   Lab Work: None ordered If you have labs (blood work) drawn today and your tests are completely normal, you will receive your results only by: Marland Kitchen MyChart Message (if you have MyChart) OR . A paper copy in the mail If you have any lab test that is abnormal or we need to change your treatment, we will call you to review the results.   Testing/Procedures: None ordered   Follow-Up: At [facility], you and your health needs are our priority.  As part of our continuing mission to provide you with exceptional heart care, we have created designated Provider Care Teams.  These Care Teams include your primary Cardiologist (physician) and Advanced Practice Providers (APPs -  Physician Assistants and Nurse Practitioners) who all work together to provide you with the care you need, when you need it.  We recommend signing up for the patient portal called "MyChart".  Sign up information is provided on this After Visit Summary.  MyChart is used to connect with patients for Virtual Visits (Telemedicine).  Patients are able to view lab/test results, encounter notes, upcoming appointments, etc.  Non-urgent messages can be sent to your provider as well.   To learn more about what you can do with MyChart, go to [email or web address].    Your next appointment:   As needed  The format for your next appointment:   In Person  Provider:   You may see Dr. Fletcher Anon or one of the following Advanced Practice Providers on your designated Care Team:    Murray Hodgkins, NP  Christell Faith, PA-C  Marrianne Mood, PA-C  Cadence Abingdon, [place]  Laurann Montana, NP    Other Instructions N/A

## 2019-12-03 NOTE — Progress Notes (Signed)
Cardiology Office Note   Date:  12/08/2019   ID:  [name], DOB Oct 16, 1960, MRN [id number]  PCP:  Durward Parcel, MD  Cardiologist:   Kathlyn Sacramento, MD   Chief Complaint  Patient presents with  . Other    12 month follow up. Patient c.o chest tightness, Dizziness, and SOB. Meds reviewed verbally with patient.       History of Present Illness: Robin Howard is a 59 y.o. female who Is here today for a follow-up visit regarding mild nonobstructive coronary artery disease.  She had 2 episodes of chest pain with mildly elevated troponin without evidence of obstructive coronary artery disease. Initial presentation was in April 2016 in the setting of bronchitis. Cardiac catheterization showed mild nonobstructive LAD disease. Ejection fraction was normal. She was diagnosed with breast cancer in 2017 and underwent lumpectomy followed by chemotherapy.  Other medical problems include essential hypertension, hyperlipidemia, chronic back pain, thyroid cancer status post thyroidectomy with subsequent hypothyroidism, obesity and sleep apnea. She had worsening dyspnea in 2019 and underwent a right and left cardiac catheterization in January 2019.  This showed minor irregularities with no evidence of obstructive coronary artery disease, normal LV systolic function and normal right heart catheterization.  She has been doing reasonably well from a cardiac standpoint.  She has occasional chest pain but the episodes are overall not frequent and mostly at rest when she is under stress.  She continues to struggle with pain issues and chronic depression.   Past Medical History:  Diagnosis Date  . Anemia    h/o  . Breast cancer ([place]) 07/2014   T1c,N0, ER/ PR negative, Her 2 neu positive. Wide excision, SLN, Mammosite, adjuvant chemotherapy.   . Bronchitis 04-2014  . Coronary artery disease, non-occlusive    a. LHC 4/16 w/ mild nonobstructive disease affecting the LAD with tortuous vessels. CTA in 2017  showed only 30% disease in the LAD. bCarlton Adam MV 6/18: neg for signficant ischemia, EF 54%, low risk scan  . Detached retina 1997  . Family history of adverse reaction to anesthesia    pts dad has pseudocholinestrace deficieny  . Gastritis   . GERD (gastroesophageal reflux disease)   . History of nuclear stress test    a. MV 6/18: no significant ischemia, normal wall motion, EF 54%. Low risk scan  . HTN (hypertension)   . Hx of papillary thyroid carcinoma 02/05/2017   Dec 2018; T3aN0Mx  s/p total thyroidectomy Dec 19, 2016, s/p I-131 RAI 01/02/17  . Hypothyroidism   . Insomnia   . Last menstrual period (LMP) > 10 days ago    LMP 2014  . Major depressive disorder, recurrent ([place])   . Migraines   . Morbid obesity ([place])   . Orthostatic hypotension    a. TTE 1/19:  EF 55-60%, normal wall motion, grade 2 diastolic dysfunction, mild aortic regurgitation, mildly calcified mitral annulus, trivial pericardial effusion was identified  . OSA (obstructive sleep apnea)    untreated due to lack of insurance  . Overactive bladder   . Pericarditis 1997  . Personal history of chemotherapy   . Personal history of radiation therapy    Mammosite  . Thyroid cancer ([place]) 12/2016   [facility]  . Thyroid nodule 10/24/2016   4.7 cm, October 2018; referred for biopsy    Past Surgical History:  Procedure Laterality Date  . BACK SURGERY     L4-5  . BREAST BIOPSY Left 07/2014   [facility]  . BREAST  CYST ASPIRATION Left 12/13/2014   3:00 position 2 cmfn, fat necrosis per Dr. Dwyane Luo notes  . BREAST LUMPECTOMY Left 2016   Invasive mammary and DCIS neg margins  . BREAST LUMPECTOMY WITH SENTINEL LYMPH NODE BIOPSY Left 08/13/2014   Procedure: BREAST LUMPECTOMY WITH SENTINEL LYMPH NODE BIOPSY;  Surgeon: Robert Bellow, MD;  Location: [facility] ORS;  Service: General;  Laterality: Left;  . BREAST MAMMOSITE Left 08/30/2014   Procedure: MAMMOSITE BREAST;  Surgeon: Robert Bellow, MD;  Location: [facility] ORS;  Service:  General;  Laterality: Left;  . CESAREAN SECTION    . COLONOSCOPY WITH PROPOFOL N/A 04/18/2016   Procedure: COLONOSCOPY WITH PROPOFOL;  Surgeon: Robert Bellow, MD;  Location: [facility] ENDOSCOPY;  Service: Endoscopy;  Laterality: N/A;  . CORONARY ANGIOPLASTY  April 2016  . EYE SURGERY Left 10/16/2015   retina repair  . FOOT SURGERY    . FORAMINOTOMY 1 LEVEL  [id number]  . KNEE SURGERY    . LAMINECTOMY  [id number]  . PORTACATH PLACEMENT Right 08/30/2014   Procedure: INSERTION PORT-A-CATH;  Surgeon: Robert Bellow, MD;  Location: [facility] ORS;  Service: General;  Laterality: Right;  . POSTERIOR LAMINECTOMY / DECOMPRESSION LUMBAR SPINE  10/29/2016   L3-5  . RETINAL DETACHMENT SURGERY Right 1997  . RETINAL DETACHMENT SURGERY  01/01/2018  . RIGHT/LEFT HEART CATH AND CORONARY ANGIOGRAPHY Bilateral 02/11/2017   Procedure: RIGHT/LEFT HEART CATH AND CORONARY ANGIOGRAPHY;  Surgeon: Wellington Hampshire, MD;  Location: [place] CV LAB;  Service: Cardiovascular;  Laterality: Bilateral;  . TOTAL THYROIDECTOMY  12/19/2016  . TOTAL THYROIDECTOMY Bilateral 12/19/2016   [facility]     Current Outpatient Medications  Medication Sig Dispense Refill  . acetaminophen (TYLENOL) 500 MG tablet Take 2 tablets (1,000 mg total) every 6 (six) hours as needed by mouth for mild pain or headache. Maximum dose of acetaminophen 3,000 mg total per day    . Ascorbic Acid (VITAMIN C) 100 MG tablet Take 100 mg by mouth daily.    Marland Kitchen aspirin EC 81 MG tablet Take 81 mg by mouth daily.    Marland Kitchen buPROPion (WELLBUTRIN SR) 150 MG 12 hr tablet Take 300 mg by mouth daily.     . cholecalciferol (VITAMIN D3) 25 MCG (1000 UT) tablet Take 1,000 Units by mouth daily.    . cyclobenzaprine (FLEXERIL) 5 MG tablet Take 5 mg by mouth 3 (three) times daily as needed for muscle spasms.     . DULoxetine (CYMBALTA) 60 MG capsule Take 1 capsule (60 mg total) by mouth daily. 30 capsule 1  . LAMOTRIGINE PO Take 10 mg by mouth daily.    Marland Kitchen levothyroxine (SYNTHROID)  112 MCG tablet Take 112 mcg by mouth daily before breakfast.     . metoprolol tartrate (LOPRESSOR) 25 MG tablet Take 25 mg by mouth daily.    . Multiple Vitamins-Iron (MULTIVITAMIN/IRON PO) Take by mouth.    . nystatin cream (MYCOSTATIN) Apply 1 application topically 2 (two) times daily. Do not get inside mouth 30 g 0  . omeprazole (PRILOSEC) 20 MG capsule TAKE ONE CAPSULE BY MOUTH DAILY 90 capsule 0  . pregabalin (LYRICA) 50 MG capsule Take 50 mg by mouth 3 (three) times daily as needed.    . simvastatin (ZOCOR) 10 MG tablet Take 1 tablet (10 mg total) by mouth at bedtime. 90 tablet 3  . traZODone (DESYREL) 100 MG tablet Take 150 mg by mouth at bedtime.     . Vilazodone HCl 20 MG TABS Take 20 mg  by mouth daily.     No current facility-administered medications for this visit.    Allergies:   Reglan [metoclopramide], Paxil [paroxetine hcl], Tape, Tegaderm ag mesh [silver], and Ultram [tramadol]    Social History:  The patient  reports that she has never smoked. She has never used smokeless tobacco. She reports that she does not drink alcohol and does not use drugs.   Family History:  The patient's family history includes Alcohol abuse in her mother; Arrhythmia in her father; Breast cancer (age of onset: 78) in her sister; COPD in her mother; Cancer in her brother, father, maternal uncle, and sister; Congestive Heart Failure in her father; Depression in her father; Diabetes in an other family member; Heart attack in her father; Heart disease (age of onset: 50) in her paternal grandfather; Heart failure in her father; Hypertension in her father and mother; Liver cancer in her brother; Thyroid disease in her mother.    ROS:  Please see the history of present illness.   Otherwise, review of systems are positive for none.   All other systems are reviewed and negative.    PHYSICAL EXAM: VS:  BP 100/80 (BP Location: Right Arm, Patient Position: Sitting, Cuff Size: Normal)   Pulse 84   Ht 5' 5.5"  (1.664 m)   Wt 226 lb (102.5 kg)   LMP 08/26/2012 (Exact Date)   SpO2 98%   BMI 37.04 kg/m  , BMI Body mass index is 37.04 kg/m. GEN: Well nourished, well developed, in no acute distress  HEENT: normal  Neck: no JVD, carotid bruits, or masses Cardiac: RRR; no murmurs, rubs, or gallops,no edema  Respiratory:  clear to auscultation bilaterally, normal work of breathing GI: soft, nontender, nondistended, + BS MS: no deformity or atrophy  Skin: warm and dry, no rash Neuro:  Strength and sensation are intact Psych: euthymic mood, full affect   EKG:  EKG is ordered today. The ekg ordered today demonstrates normal sinus rhythm with nonspecific ST and T wave changes.   Recent Labs: No results found for requested labs within last 8760 hours.    Lipid Panel    Component Value Date/Time   CHOL 162 08/22/2016 1033   CHOL 148 11/04/2015 0821   CHOL 132 04/17/2014 0444   TRIG 187 (H) 08/22/2016 1033   TRIG 115 04/17/2014 0444   HDL 47 (L) 08/22/2016 1033   HDL 43 11/04/2015 0821   HDL 37 (L) 04/17/2014 0444   CHOLHDL 3.4 08/22/2016 1033   VLDL 37 (H) 08/22/2016 1033   VLDL 23 04/17/2014 0444   LDLCALC 78 08/22/2016 1033   LDLCALC 75 11/04/2015 0821   LDLCALC 72 04/17/2014 0444      Wt Readings from Last 3 Encounters:  12/03/19 226 lb (102.5 kg)  11/27/18 229 lb 12.8 oz (104.2 kg)  04/25/18 235 lb (106.6 kg)         ASSESSMENT AND PLAN:  1.  Coronary atherosclerosis without obstructive coronary artery disease: Currently with no anginal symptoms. Recommend continuing aggressive medical therapy. Continue low-dose aspirin.  2. Essential hypertension: Blood pressure is controlled on metoprolol  3. Hyperlipidemia: She is currently on simvastatin.  Her LDL has been below 70.    Disposition:   No active cardiac issues at the present time.  She can follow-up with me as needed. Signed,  Kathlyn Sacramento, MD  12/08/2019 1:29 PM    [facility]

## 2020-02-10 MED ORDER — METOPROLOL TARTRATE 25 MG PO TABS: 25.0000 mg | ORAL_TABLET | Freq: Every day | ORAL | 1 refills | Status: DC

## 2020-10-24 ENCOUNTER — Other Ambulatory Visit: Payer: Self-pay | Admitting: Cardiovascular Disease

## 2020-12-13 ENCOUNTER — Other Ambulatory Visit: Payer: Self-pay | Admitting: Cardiovascular Disease

## 2020-12-13 NOTE — Telephone Encounter (Signed)
Patient needs an appt for further refills, Thanks !

## 2020-12-14 NOTE — Progress Notes (Deleted)
Cardiology Office Note    Date:  12/14/2020   ID:  TANAY MASSIAH, DOB Mar 06, 1960, MRN [id number]  PCP:  Durward Parcel, MD  Cardiologist:  Kathlyn Sacramento, MD  Electrophysiologist:  None   Chief Complaint: Follow-up  History of Present Illness:   Robin Howard is a 60 y.o. female with history of nonobstructive CAD, breast cancer in 07/2015 status postlumpectomy followed by chemotherapy, HTN, HLD, chronic back pain status post lumbar decompression and laminectomy in 10/2016, papillary thyroid carcinoma status post thyroidectomy in 12/2016 status post radioactive iodine therapy with surgical hypothyroidism, and obesity with mixed sleep apnea who presents for follow-up of nonobstructive CAD.   She was admitted to the hospital in 04/2014 with chest pain, in the setting of bronchitis, and and found to have a mild troponin elevation peaking at 0.5.  LHC performed at that time showed nonobstructive CAD involving the LAD.  EF was normal.  She had worsening dyspnea in 2019 and underwent R/LHC at that time which showed minor irregularities with no evidence of obstructive CAD, normal LV systolic function, and normal right heart catheterization.  Echo at that time showed an EF of 55 to 65%, mild LVH, and was overall a difficult study due to poor acoustic windows and body habitus.  She was last seen in the office in 11/2019 and was doing reasonably well from a cardiac perspective, noting occasional chest pain that were not as frequent as what she had previously experienced and were mostly at rest when under stress.  ***   Labs independently reviewed: 03/2020 - Hgb 12.2, PLT 280, potassium 3.6, BUN 7, serum creatinine 1.09, TSH normal 05/2019 - albumin 4.0, AST/ALT normal 11/2018 - A1c 5.4 03/2017 - TC 117, TG 163, HDL 42, LDL 42  Past Medical History:  Diagnosis Date   Anemia    h/o   Breast cancer ([place]) 07/2014   T1c,N0, ER/ PR negative, Her 2 neu positive. Wide excision, SLN, Mammosite, adjuvant  chemotherapy.    Bronchitis 04-2014   Coronary artery disease, non-occlusive    a. LHC 4/16 w/ mild nonobstructive disease affecting the LAD with tortuous vessels. CTA in 2017 showed only 30% disease in the LAD. bCarlton Adam MV 6/18: neg for signficant ischemia, EF 54%, low risk scan   Detached retina 1997   Family history of adverse reaction to anesthesia    pts dad has pseudocholinestrace deficieny   Gastritis    GERD (gastroesophageal reflux disease)    History of nuclear stress test    a. MV 6/18: no significant ischemia, normal wall motion, EF 54%. 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 TTE 1/19:  EF 55-60%, normal wall motion, grade 2 diastolic dysfunction, mild aortic regurgitation, mildly calcified mitral annulus, trivial pericardial effusion was identified   OSA (obstructive sleep apnea)    untreated due to lack of insurance   Overactive bladder    Pericarditis 1997   Personal history of chemotherapy    Personal history of radiation therapy    Mammosite   Thyroid cancer ([place]) 12/2016   [facility]  Thyroid nodule 10/24/2016   4.7 cm, October 2018; referred for biopsy    Past Surgical History:  Procedure Laterality Date   BACK SURGERY     L4-5   BREAST BIOPSY Left 07/2014   [facility]   BREAST CYST ASPIRATION Left 12/13/2014   3:00 position 2 cmfn, fat necrosis per Dr. Dwyane Luo notes   BREAST LUMPECTOMY Left 2016   Invasive mammary and DCIS neg margins   BREAST LUMPECTOMY WITH SENTINEL LYMPH NODE BIOPSY Left 08/13/2014   Procedure: BREAST LUMPECTOMY WITH SENTINEL LYMPH NODE BIOPSY;  Surgeon: Robert Bellow, MD;  Location: [place] ORS;  Service: General;   Laterality: Left;   BREAST MAMMOSITE Left 08/30/2014   Procedure: MAMMOSITE BREAST;  Surgeon: Robert Bellow, MD;  Location: [facility] ORS;  Service: General;  Laterality: Left;   CESAREAN SECTION     COLONOSCOPY WITH PROPOFOL N/A 04/18/2016   Procedure: COLONOSCOPY WITH PROPOFOL;  Surgeon: Robert Bellow, MD;  Location: [facility] ENDOSCOPY;  Service: Endoscopy;  Laterality: N/A;   CORONARY ANGIOPLASTY  April 2016   EYE SURGERY Left 10/16/2015   retina repair   FOOT SURGERY     FORAMINOTOMY 1 LEVEL  [id number]   KNEE SURGERY     LAMINECTOMY  [id number]   PORTACATH PLACEMENT Right 08/30/2014   Procedure: INSERTION PORT-A-CATH;  Surgeon: Robert Bellow, MD;  Location: [facility] ORS;  Service: General;  Laterality: Right;   POSTERIOR LAMINECTOMY / DECOMPRESSION LUMBAR SPINE  10/29/2016   L3-5   RETINAL DETACHMENT SURGERY Right 1997   RETINAL DETACHMENT SURGERY  01/01/2018   RIGHT/LEFT HEART CATH AND CORONARY ANGIOGRAPHY Bilateral 02/11/2017   Procedure: RIGHT/LEFT HEART CATH AND CORONARY ANGIOGRAPHY;  Surgeon: Wellington Hampshire, MD;  Location: [place] CV LAB;  Service: Cardiovascular;  Laterality: Bilateral;   TOTAL THYROIDECTOMY  12/19/2016   TOTAL THYROIDECTOMY Bilateral 12/19/2016   [facility]    Current Medications: No outpatient medications have been marked as taking for the 12/15/20 encounter (Appointment) with Rise Mu, PA-C.    Allergies:   Reglan [metoclopramide], Paxil [paroxetine hcl], Tape, Tegaderm ag mesh [silver], and Ultram [tramadol]   Social History   Socioeconomic History   Marital status: Divorced    Spouse name: Not on file   Number of children: Not on file   Years of education: Not on file   Highest education level: Not on file  Occupational History   Not on file  Tobacco Use   Smoking status: Never   Smokeless tobacco: Never  Vaping Use   Vaping Use: Never used  Substance and Sexual Activity   Alcohol use: No   Drug use: No   Sexual activity: Not Currently   Other Topics Concern   Not on file  Social History Narrative   Not on file   Social Determinants of Health   Financial Resource Strain: Not on file  Food Insecurity: Not on file  Transportation Needs: Not on file  Physical Activity: Not on file  Stress: Not on file  Social Connections: Not on file     Family History:  The patient's family history includes Alcohol abuse in her mother; Arrhythmia in her father; Breast cancer (age of onset: 50) in her sister; COPD in her mother; Cancer in her brother, father, maternal uncle, and sister; Congestive Heart Failure in her father; Depression in her father; Diabetes in an other family member; Heart attack in her father; Heart disease (age of onset: 39) in her paternal grandfather; Heart failure in her father; Hypertension  in her father and mother; Liver cancer in her brother; Thyroid disease in her mother.  ROS:   ROS   EKGs/Labs/Other Studies Reviewed:    Studies reviewed were summarized above. The additional studies were reviewed today:  R/LHC 01/2017: Prox LAD lesion is 15% stenosed. The left ventricular systolic function is normal. LV end diastolic pressure is normal. The left ventricular ejection fraction is 55-65% by visual estimate.   1.  Minor irregularities with no evidence of obstructive coronary artery disease. 2.  Normal LV systolic function. 3.  Right heart catheterization showed normal filling pressures, normal pulmonary pressure and normal cardiac output.  RA pressure was 2, RV pressure was 30 over 8 mmHg, pulmonary capillary wedge pressure was 10 mmHg, PA pressure was 27/13 with a mean of 19 mmHg.   Recommendations: Shortness of breath and chest pain do not seem to be cardiac in origin. __________  2D echo 01/2017: - Procedure narrative: Transthoracic echocardiography. Image    quality was suboptimal. The study was technically difficult, as a    result of poor acoustic windows and body habitus.  - Left ventricle:  The cavity size was normal. Wall thickness was    increased in a pattern of mild LVH. Systolic function was normal.    The estimated ejection fraction was in the range of 55% to 65%.    The study is not technically sufficient to allow evaluation of LV    diastolic function.  - Right ventricle: The cavity size was normal. Wall thickness was    normal. Systolic function was normal.  __________  Carlton Adam MPI 06/2016: Pharmacological myocardial perfusion imaging study with no significant ischemia Normal wall motion, EF estimated at 54% No EKG changes concerning for ischemia at peak stress or in recovery. Nonspecific ST and T wave abnormality at rest Low risk scan __________  2D echo 01/2015: - Left ventricle: Systolic function was normal. The estimated    ejection fraction was 50%. Doppler parameters are consistent with    abnormal left ventricular relaxation (grade 1 diastolic    dysfunction).  - Aortic valve: Valve area (Vmax): 2.12 cm^2.  - Mitral valve: There was mild regurgitation.  - Right atrium: The atrium was mildly dilated.  - Atrial septum: No defect or patent foramen ovale was identified.   Impressions:   - Normal wall motion exept mild septal hypokinesis, with    borderrline LVEF and mild diastolic dysfunction.    EKG:  EKG is ordered today.  The EKG ordered today demonstrates ***  Recent Labs: No results found for requested labs within last 8760 hours.  Recent Lipid Panel    Component Value Date/Time   CHOL 162 08/22/2016 1033   CHOL 148 11/04/2015 0821   CHOL 132 04/17/2014 0444   TRIG 187 (H) 08/22/2016 1033   TRIG 115 04/17/2014 0444   HDL 47 (L) 08/22/2016 1033   HDL 43 11/04/2015 0821   HDL 37 (L) 04/17/2014 0444   CHOLHDL 3.4 08/22/2016 1033   VLDL 37 (H) 08/22/2016 1033   VLDL 23 04/17/2014 0444   LDLCALC 78 08/22/2016 1033   LDLCALC 75 11/04/2015 0821   LDLCALC 72 04/17/2014 0444    PHYSICAL EXAM:    VS:  LMP 08/26/2012 (Exact Date)   BMI:  There is no height or weight on file to calculate BMI.  Physical Exam  Wt Readings from Last 3 Encounters:  12/03/19 226 lb (102.5 kg)  11/27/18 229 lb 12.8 oz (104.2 kg)  04/25/18 235 lb (106.6  kg)     ASSESSMENT & PLAN:   Nonobstructive CAD:  HTN: Blood pressure ***  HLD: LDL 42 in 03/2017.   {Are you ordering a CV Procedure (e.g. stress test, cath, DCCV, TEE, etc)?   Press F2        :[id number]}     Disposition: F/u with Dr. Fletcher Anon or an APP in ***.   Medication Adjustments/Labs and Tests Ordered: Current medicines are reviewed at length with the patient today.  Concerns regarding medicines are outlined above. Medication changes, Labs and Tests ordered today are summarized above and listed in the Patient Instructions accessible in Encounters.   Signed, Christell Faith, PA-C 12/14/2020 10:01 AM     [facility] -  [address] [place] [address] [place], [place] [address] [phone]

## 2020-12-15 ENCOUNTER — Ambulatory Visit: Payer: Medicare HMO | Admitting: Physician Assistant

## 2020-12-19 ENCOUNTER — Telehealth: Payer: Self-pay | Admitting: Cardiovascular Disease

## 2020-12-19 NOTE — Telephone Encounter (Signed)
*  STAT* If patient is at the pharmacy, call can be transferred to refill team.   1. Which medications need to be refilled? (please list name of each medication and dose if known) Lopressor 25mg  1 tablet daily  2. Which pharmacy/location (including street and city if local pharmacy) is medication to be sent to? [facility]  3. Do they need a 30 day or 90 day supply? 90 day

## 2020-12-19 NOTE — Telephone Encounter (Signed)
Refill for #45 and 0 refills was sent pharmacy per patients' request with notation patient is to keep scheduled appointment.

## 2021-01-30 NOTE — Progress Notes (Signed)
Cardiology Office Note    Date:  01/31/2021   ID:  Robin Howard, DOB Dec 27, 1960, MRN [id number]  PCP:  Durward Parcel, MD  Cardiologist:  Kathlyn Sacramento, MD  Electrophysiologist:  None   Chief Complaint: Follow-up  History of Present Illness:   Robin Howard is a 61 y.o. female with history of mild nonobstructive CAD, HTN, HLD, chronic back pain, breast cancer diagnosed in 2017 status post lumpectomy and chemotherapy, thyroid cancer status post thyroidectomy with subsequent hypothyroidism on replacement therapy, obesity, and sleep apnea who presents for follow-up of CAD.  She was admitted to the hospital in 04/2014 with chest pain and mildly elevated troponin with LHC demonstrating mild nonobstructive LAD disease.  EF was normal.  In 2019, she had worsening dyspnea and underwent an [facility] which showed minor luminal irregularities with no evidence of obstructive CAD, normal LV systolic function, and normal right heart catheterization.  She was last seen in the office in 11/2019 and was doing reasonably well from a cardiac perspective with noted occasional chest pain with episodes overall being infrequent and occurring mostly at rest when under stress.  She comes in doing reasonably well from a cardiac perspective.  She notes chronic stable dyspnea that has been present for years.  No frank angina, palpitations, presyncope, or syncope.  She does note some dizziness with prolonged activities and quick positional changes.  No lower extremity swelling, abdominal distention, or orthopnea.  She does watch her salt intake.  She has been transitioned from simvastatin to atorvastatin by primary care.    Labs independently reviewed: 03/2020 - Hgb 12.2, PLT 280, potassium 3.6, BUN 7, serum creatinine 1.09, TSH normal 05/2019 - albumin 4.0, AST/ALT normal 11/2018 - A1c 5.4 03/2017 - TC 117, TG 163, HDL 42, LDL 42  Past Medical History:  Diagnosis Date   Anemia    h/o   Breast cancer ([place]) 07/2014    T1c,N0, ER/ PR negative, Her 2 neu positive. Wide excision, SLN, Mammosite, adjuvant chemotherapy.    Bronchitis 04-2014   Coronary artery disease, non-occlusive    a. LHC 4/16 w/ mild nonobstructive disease affecting the LAD with tortuous vessels. CTA in 2017 showed only 30% disease in the LAD. [facility] 6/18: neg for signficant ischemia, EF 54%, low risk scan   Detached retina 1997   Family history of adverse reaction to anesthesia    pts dad has pseudocholinestrace deficieny   Gastritis    GERD (gastroesophageal reflux disease)    History of nuclear stress test    a. MV 6/18: no significant ischemia, normal wall motion, EF 54%. 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 TTE 1/19:  EF 55-60%, normal wall motion, grade 2 diastolic dysfunction, mild aortic regurgitation, mildly calcified mitral annulus, trivial pericardial effusion was identified   OSA (obstructive sleep apnea)    untreated due to lack of insurance   Overactive bladder    Pericarditis 1997   Personal history of chemotherapy    Personal history of radiation therapy    Mammosite   Thyroid cancer ([place])  12/2016   [facility]   Thyroid nodule 10/24/2016   4.7 cm, October 2018; referred for biopsy    Past Surgical History:  Procedure Laterality Date   BACK SURGERY     L4-5   BREAST BIOPSY Left 07/2014   [facility]   BREAST CYST ASPIRATION Left 12/13/2014   3:00 position 2 cmfn, fat necrosis per Dr. Dwyane Luo notes   BREAST LUMPECTOMY Left 2016   Invasive mammary and DCIS neg margins   BREAST LUMPECTOMY WITH SENTINEL LYMPH NODE BIOPSY Left 08/13/2014   Procedure: BREAST LUMPECTOMY WITH SENTINEL LYMPH NODE  BIOPSY;  Surgeon: Robert Bellow, MD;  Location: [facility];  Service: General;  Laterality: Left;   BREAST MAMMOSITE Left 08/30/2014   Procedure: MAMMOSITE BREAST;  Surgeon: Robert Bellow, MD;  Location: [facility];  Service: General;  Laterality: Left;   CESAREAN SECTION     COLONOSCOPY WITH PROPOFOL N/A 04/18/2016   Procedure: COLONOSCOPY WITH PROPOFOL;  Surgeon: Robert Bellow, MD;  Location: [facility];  Service: Endoscopy;  Laterality: N/A;   CORONARY ANGIOPLASTY  April 2016   EYE SURGERY Left 10/16/2015   retina repair   FOOT SURGERY     FORAMINOTOMY 1 LEVEL  [id number]   KNEE SURGERY     LAMINECTOMY  [id number]   PORTACATH PLACEMENT Right 08/30/2014   Procedure: INSERTION PORT-A-CATH;  Surgeon: Robert Bellow, MD;  Location: [facility];  Service: General;  Laterality: Right;   POSTERIOR LAMINECTOMY / DECOMPRESSION LUMBAR SPINE  10/29/2016   L3-5   RETINAL DETACHMENT SURGERY Right 1997   RETINAL DETACHMENT SURGERY  01/01/2018   RIGHT/LEFT HEART CATH AND CORONARY ANGIOGRAPHY Bilateral 02/11/2017   Procedure: RIGHT/LEFT HEART CATH AND CORONARY ANGIOGRAPHY;  Surgeon: Wellington Hampshire, MD;  Location: [place] [facility] [facility];  Service: Cardiovascular;  Laterality: Bilateral;   TOTAL THYROIDECTOMY  12/19/2016   TOTAL THYROIDECTOMY Bilateral 12/19/2016   [facility]    Current Medications: Current Meds  Medication Sig   acetaminophen (TYLENOL) 500 MG tablet Take 2 tablets (1,000 mg total) every 6 (six) hours as needed by mouth for mild pain or headache. Maximum dose of acetaminophen 3,000 mg total per day   Ascorbic Acid (VITAMIN C) 100 MG tablet Take 100 mg by mouth daily.   aspirin EC 81 MG tablet Take 81 mg by mouth daily.   atorvastatin (LIPITOR) 20 MG tablet Take 1 tablet by mouth daily.   buPROPion (WELLBUTRIN XL) 150 MG 24 hr tablet Take 450 mg by mouth daily.   cholecalciferol (VITAMIN D3) 25 MCG (1000 UT) tablet Take 1,000 Units by mouth daily.   cyclobenzaprine (FLEXERIL) 5 MG  tablet Take 5 mg by mouth 3 (three) times daily as needed for muscle spasms.    FLUoxetine (PROZAC) 10 MG capsule Take 20 mg by mouth daily.   levothyroxine (SYNTHROID) 112 MCG tablet Take 112 mcg by mouth daily before breakfast.    Multiple Vitamins-Iron (MULTIVITAMIN/IRON PO) Take by mouth.   nystatin cream (MYCOSTATIN) Apply 1 application topically 2 (two) times daily. Do not get inside mouth   pantoprazole (PROTONIX) 40 MG tablet Take 40 mg by mouth daily.   topiramate (TOPAMAX) 25 MG tablet Take 50 mg by mouth 2 (two) times daily.   [DISCONTINUED] metoprolol tartrate (LOPRESSOR) 25 MG tablet Take 1 tablet (25 mg total) by mouth daily. NO FURTHER REFILLS UNTIL SEEN IN CLINIC.    Allergies:   Reglan [metoclopramide], Paxil [paroxetine hcl], Tape, Tegaderm ag mesh [silver], and Ultram [tramadol]   Social History  Socioeconomic History   Marital status: Divorced    Spouse name: Not on file   Number of children: Not on file   Years of education: Not on file   Highest education level: Not on file  Occupational History   Not on file  Tobacco Use   Smoking status: Never   Smokeless tobacco: Never  Vaping Use   Vaping Use: Never used  Substance and Sexual Activity   Alcohol use: No   Drug use: No   Sexual activity: Not Currently  Other Topics Concern   Not on file  Social History Narrative   Not on file   Social Determinants of Health   Financial Resource Strain: Not on file  Food Insecurity: Not on file  Transportation Needs: Not on file  Physical Activity: Not on file  Stress: Not on file  Social Connections: Not on file     Family History:  The patient's family history includes Alcohol abuse in her mother; Arrhythmia in her father; Breast cancer (age of onset: 67) in her sister; COPD in her mother; Cancer in her brother, father, maternal uncle, and sister; Congestive Heart Failure in her father; Depression in her father; Diabetes in an other family member; Heart attack  in her father; Heart disease (age of onset: 54) in her paternal grandfather; Heart failure in her father; Hypertension in her father and mother; Liver cancer in her brother; Thyroid disease in her mother.  ROS:   Review of Systems  Constitutional:  Positive for malaise/fatigue. Negative for chills, diaphoresis, fever and weight loss.  HENT:  Negative for congestion.   Eyes:  Negative for discharge and redness.  Respiratory:  Positive for shortness of breath. Negative for cough, sputum production and wheezing.   Cardiovascular:  Negative for chest pain, palpitations, orthopnea, leg swelling and PND.  Musculoskeletal:  Negative for falls and myalgias.  Skin:  Negative for rash.  Neurological:  Positive for dizziness. Negative for tingling, tremors, sensory change, speech change, focal weakness, loss of consciousness and weakness.  Endo/Heme/Allergies:  Does not bruise/bleed easily.  Psychiatric/Behavioral:  Negative for substance abuse. The patient is not nervous/anxious.   All other systems reviewed and are negative.   EKGs/Labs/Other Studies Reviewed:    Studies reviewed were summarized above. The additional studies were reviewed today:  [facility] 02/11/2017: Prox LAD lesion is 15% stenosed. The left ventricular systolic function is normal. LV end diastolic pressure is normal. The left ventricular ejection fraction is 55-65% by visual estimate.   1.  Minor irregularities with no evidence of obstructive coronary artery disease. 2.  Normal LV systolic function. 3.  Right heart catheterization showed normal filling pressures, normal pulmonary pressure and normal cardiac output.  RA pressure was 2, RV pressure was 30 over 8 mmHg, pulmonary capillary wedge pressure was 10 mmHg, PA pressure was 27/13 with a mean of 19 mmHg.   Recommendations: Shortness of breath and chest pain do not seem to be cardiac in origin. __________  2D echo 02/05/2017: - Procedure narrative: Transthoracic  echocardiography. Image    quality was suboptimal. The study was technically difficult, as a    result of poor acoustic windows and body habitus.  - Left ventricle: The cavity size was normal. Wall thickness was    increased in a pattern of mild LVH. Systolic function was normal.    The estimated ejection fraction was in the range of 55% to 65%.    The study is not technically sufficient to allow evaluation of LV  diastolic function.  - Right ventricle: The cavity size was normal. Wall thickness was    normal. Systolic function was normal.  __________  [facility] 06/27/2016: Pharmacological myocardial perfusion imaging study with no significant ischemia Normal wall motion, EF estimated at 54% No EKG changes concerning for ischemia at peak stress or in recovery. Nonspecific ST and T wave abnormality at rest Low risk scan __________  2D echo 01/25/2015: - Left ventricle: Systolic function was normal. The estimated    ejection fraction was 50%. Doppler parameters are consistent with    abnormal left ventricular relaxation (grade 1 diastolic    dysfunction).  - Aortic valve: Valve area (Vmax): 2.12 cm^2.  - Mitral valve: There was mild regurgitation.  - Right atrium: The atrium was mildly dilated.  - Atrial septum: No defect or patent foramen ovale was identified.   Impressions:   - Normal wall motion exept mild septal hypokinesis, with    borderrline LVEF and mild diastolic dysfunction.    EKG:  EKG is ordered today.  The EKG ordered today demonstrates NSR, 71 bpm, anterolateral T wave inversion consistent with prior tracings  Recent Labs: No results found for requested labs within last 8760 hours.  Recent Lipid Panel    Component Value Date/Time   CHOL 162 08/22/2016 1033   CHOL 148 11/04/2015 0821   CHOL 132 04/17/2014 0444   TRIG 187 (H) 08/22/2016 1033   TRIG 115 04/17/2014 0444   HDL 47 (L) 08/22/2016 1033   HDL 43 11/04/2015 0821   HDL 37 (L) 04/17/2014 0444    CHOLHDL 3.4 08/22/2016 1033   VLDL 37 (H) 08/22/2016 1033   VLDL 23 04/17/2014 0444   LDLCALC 78 08/22/2016 1033   LDLCALC 75 11/04/2015 0821   LDLCALC 72 04/17/2014 0444    PHYSICAL EXAM:    VS:  BP 110/80 (BP Location: Right Arm, Patient Position: Sitting, Cuff Size: Large)    Pulse 71    Ht 5' 5.5" (1.664 m)    Wt 235 lb (106.6 kg)    LMP 08/26/2012 (Exact Date)    SpO2 99%    BMI 38.51 kg/m   BMI: Body mass index is 38.51 kg/m.  Physical Exam Vitals reviewed.  Constitutional:      Appearance: She is well-developed.  HENT:     Head: Normocephalic and atraumatic.  Eyes:     General:        Right eye: No discharge.        Left eye: No discharge.  Neck:     Vascular: No JVD.  Cardiovascular:     Rate and Rhythm: Normal rate and regular rhythm.     Pulses:          Posterior tibial pulses are 2+ on the right side and 2+ on the left side.     Heart sounds: Normal heart sounds, S1 normal and S2 normal. Heart sounds not distant. No midsystolic click and no opening snap. No murmur heard.   No friction rub.  Pulmonary:     Effort: Pulmonary effort is normal. No respiratory distress.     Breath sounds: Normal breath sounds. No decreased breath sounds, wheezing or rales.  Chest:     Chest wall: No tenderness.  Abdominal:     General: There is no distension.     Palpations: Abdomen is soft.     Tenderness: There is no abdominal tenderness.  Musculoskeletal:     Cervical back: Normal range of motion.  Right lower leg: No edema.     Left lower leg: No edema.  Skin:    General: Skin is warm and dry.     Nails: There is no clubbing.  Neurological:     Mental Status: She is alert and oriented to person, place, and time.  Psychiatric:        Speech: Speech normal.        Behavior: Behavior normal.        Thought Content: Thought content normal.        Judgment: Judgment normal.    Wt Readings from Last 3 Encounters:  01/31/21 235 lb (106.6 kg)  12/03/19 226 lb (102.5  kg)  11/27/18 229 lb 12.8 oz (104.2 kg)     ASSESSMENT & PLAN:   Nonobstructive CAD: No symptoms of chest pain.  Recent LHC demonstrated normal coronary arteries.  She remains on aspirin and atorvastatin.  Chronic dyspnea: Stable for several years.  Update echo.  If this is unrevealing, would encourage weight loss through heart healthy diet and regular exercise.  HTN: Blood pressure is well controlled in the office today.  She remains on metoprolol.  HLD: LDL 42 in 2019.  On atorvastatin.  Followed by PCP.   Disposition: F/u with Dr. Fletcher Anon or an APP in 12 months.   Medication Adjustments/Labs and Tests Ordered: Current medicines are reviewed at length with the patient today.  Concerns regarding medicines are outlined above. Medication changes, Labs and Tests ordered today are summarized above and listed in the Patient Instructions accessible in Encounters.   Signed, Christell Faith, PA-C 01/31/2021 12:41 PM     [address], [place] [address] [phone]

## 2021-01-31 ENCOUNTER — Encounter: Payer: Self-pay | Admitting: Physician Assistant

## 2021-01-31 ENCOUNTER — Other Ambulatory Visit: Payer: Self-pay

## 2021-01-31 ENCOUNTER — Ambulatory Visit (INDEPENDENT_AMBULATORY_CARE_PROVIDER_SITE_OTHER): Payer: Medicare HMO | Admitting: Physician Assistant

## 2021-01-31 VITALS — BP 110/80 | HR 71 | Ht 65.5 in | Wt 235.0 lb

## 2021-01-31 DIAGNOSIS — I10 Essential (primary) hypertension: Secondary | ICD-10-CM | POA: Diagnosis not present

## 2021-01-31 DIAGNOSIS — E785 Hyperlipidemia, unspecified: Secondary | ICD-10-CM

## 2021-01-31 DIAGNOSIS — R0609 Other forms of dyspnea: Secondary | ICD-10-CM | POA: Diagnosis not present

## 2021-01-31 DIAGNOSIS — I2510 Atherosclerotic heart disease of native coronary artery without angina pectoris: Secondary | ICD-10-CM | POA: Diagnosis not present

## 2021-01-31 MED ORDER — METOPROLOL TARTRATE 25 MG PO TABS: 25.0000 mg | ORAL_TABLET | Freq: Every day | ORAL | 3 refills | Status: AC

## 2021-01-31 NOTE — Patient Instructions (Signed)
Medication Instructions:   Your physician recommends that you continue on your current medications as directed. Please refer to the Current Medication list given to you today.   *If you need a refill on your cardiac medications before your next appointment, please call your pharmacy*   Lab Work: None ordered  If you have labs (blood work) drawn today and your tests are completely normal, you will receive your results only by: [place] (if you have MyChart) OR A paper copy in the mail If you have any lab test that is abnormal or we need to change your treatment, we will call you to review the results.   Testing/Procedures:  Echocardiogram - Your physician has requested that you have an echocardiogram. Echocardiography is a painless test that uses sound waves to create images of your heart. It provides your doctor with information about the size and shape of your heart and how well your hearts chambers and valves are working. This procedure takes approximately one hour. There are no restrictions for this procedure.    Follow-Up: At [facility], you and your health needs are our priority.  As part of our continuing mission to provide you with exceptional heart care, we have created designated Provider Care Teams.  These Care Teams include your primary Cardiologist (physician) and Advanced Practice Providers (APPs -  Physician Assistants and Nurse Practitioners) who all work together to provide you with the care you need, when you need it.  We recommend signing up for the patient portal called "MyChart".  Sign up information is provided on this After Visit Summary.  MyChart is used to connect with patients for Virtual Visits (Telemedicine).  Patients are able to view lab/test results, encounter notes, upcoming appointments, etc.  Non-urgent messages can be sent to your provider as well.   To learn more about what you can do with MyChart, go to [email or web address].    Your next  appointment:    Your physician wants you to follow-up in: 1 year.  You will receive a reminder letter in the mail two months in advance. If you don't receive a letter, please call our office to schedule the follow-up appointment.   The format for your next appointment:   In Person  Provider:   You may see Kathlyn Sacramento, MD or one of the following Advanced Practice Providers on your designated Care Team:   Murray Hodgkins, NP Christell Faith, PA-C Cadence Kathlen Mody, PA-C :1}    Other Instructions N/A

## 2021-02-02 ENCOUNTER — Ambulatory Visit: Payer: Medicare HMO | Admitting: Nurse Practitioner

## 2021-02-27 ENCOUNTER — Other Ambulatory Visit: Payer: Medicaid Other

## 2021-03-22 ENCOUNTER — Other Ambulatory Visit: Payer: Self-pay

## 2021-03-22 ENCOUNTER — Ambulatory Visit (INDEPENDENT_AMBULATORY_CARE_PROVIDER_SITE_OTHER): Payer: Medicare HMO

## 2021-03-22 DIAGNOSIS — I10 Essential (primary) hypertension: Secondary | ICD-10-CM | POA: Diagnosis not present

## 2021-03-23 LAB — ECHOCARDIOGRAM COMPLETE
AR max vel: 2.7 cm2
AV Area VTI: 2.41 cm2
AV Area mean vel: 2.04 cm2
AV Mean grad: 3 mmHg
AV Peak grad: 4.4 mmHg
Ao pk vel: 1.05 m/s
Area-P 1/2: 3.72 cm2
Calc EF: 57.6 %
S' Lateral: 3.9 cm
Single Plane A2C EF: 60 %
Single Plane A4C EF: 56.9 %

## 2021-03-24 ENCOUNTER — Telehealth: Payer: Self-pay | Admitting: *Deleted

## 2021-03-24 NOTE — Telephone Encounter (Signed)
-----   Message from Theora Gianotti, NP sent at 03/23/2021  8:04 PM EST ----- ?Normal heart squeezing function.  Trivial leakiness of the mitral valve.  The Aorta is mildly dilated.  Overall, no new findings to explain shortness of breath. ?

## 2021-03-24 NOTE — Telephone Encounter (Signed)
Left voicemail message to call back for review of results.  

## 2021-03-24 NOTE — Telephone Encounter (Signed)
Reviewed results with patient and she verbalized understanding with no further questions at this time.  

## 2021-11-30 ENCOUNTER — Telehealth: Payer: Self-pay | Admitting: Cardiovascular Disease

## 2021-11-30 NOTE — Telephone Encounter (Signed)
Provided that HR is > 60, she may take an additional metoprolol 25 mg for blood pressures > 170 mmHg.  With recent elevation, she should f/u in office at first available time.

## 2021-11-30 NOTE — Telephone Encounter (Signed)
Pt c/o BP issue: STAT if pt c/o blurred vision, one-sided weakness or slurred speech  1. What are your last 5 BP readings?  11/16: 184/125  2. Are you having any other symptoms (ex. Dizziness, headache, blurred vision, passed out)? Headache, is a little hot  3. What is your BP issue?   BP is elevated

## 2021-11-30 NOTE — Telephone Encounter (Signed)
Pt called reporting dentist appointment today was canceled due BP being elevated. She report she can't remember the first reading, but second was 170/117, and third 184/125. Pt stated she hasn't been checking her BP as it was previously normal.   Pt report she currently has a headache and head feel foggy. She denies blurred vision or any other symptoms. Pt questioning if she should come in today for blood pressure check.Nurse informed pt that unfortunately we don't have any available today but recommend urgent care due to head feeling foggy. Pt also made aware that [facility] pharmacy will check BP for free, however, can't treat BP if it's still elevated. Pt verbalized understanding.  Will forward to PA for any further recommendations.

## 2021-12-01 NOTE — Telephone Encounter (Signed)
Left message for pt to call back  °

## 2021-12-04 NOTE — Telephone Encounter (Signed)
Pt updated with MD's recommendations and verbalized understanding. Appointment scheduled for 11/22 with Dr. Fletcher Anon.

## 2021-12-06 ENCOUNTER — Ambulatory Visit: Payer: Medicare HMO | Attending: Cardiovascular Disease | Admitting: Cardiovascular Disease

## 2021-12-06 ENCOUNTER — Encounter: Payer: Self-pay | Admitting: Cardiovascular Disease

## 2021-12-06 VITALS — BP 118/80 | HR 66 | Ht 65.5 in | Wt 233.2 lb

## 2021-12-06 DIAGNOSIS — I2510 Atherosclerotic heart disease of native coronary artery without angina pectoris: Secondary | ICD-10-CM | POA: Diagnosis not present

## 2021-12-06 DIAGNOSIS — E785 Hyperlipidemia, unspecified: Secondary | ICD-10-CM | POA: Diagnosis not present

## 2021-12-06 DIAGNOSIS — I10 Essential (primary) hypertension: Secondary | ICD-10-CM

## 2021-12-06 NOTE — Progress Notes (Signed)
Cardiology Office Note   Date:  12/06/2021   ID:  Robin Howard, DOB [birth year], MRN [id number]  PCP:  Durward Parcel, MD  Cardiologist:   Kathlyn Sacramento, MD   Chief Complaint  Patient presents with   OTHER    Elevated BP. Meds reviewed verbally with pt.      History of Present Illness: Robin Howard is a 61 y.o. female who Is here today for a follow-up visit regarding mild nonobstructive coronary artery disease and essential hypertension. She had 2 episodes of chest pain with mildly elevated troponin without evidence of obstructive coronary artery disease. Initial presentation was in April 2016 in the setting of bronchitis. Cardiac catheterization showed mild nonobstructive LAD disease. Ejection fraction was normal. She was diagnosed with breast cancer in 2017 and underwent lumpectomy followed by chemotherapy.  Other medical problems include essential hypertension, hyperlipidemia, chronic back pain, thyroid cancer status post thyroidectomy with subsequent hypothyroidism, obesity and sleep apnea. She had worsening dyspnea in 2019 and underwent a right and left cardiac catheterization in January 2019.  This showed minor irregularities with no evidence of obstructive coronary artery disease, normal LV systolic function and normal right heart catheterization.  She went for routine dental cleaning recently and was found to have severely elevated blood pressure at 170/100.  She denies that she was anxious.  When she returned back home she checked her blood pressure and it was down to 140/90.  There has been no change in her medications and she was not taking NSAIDs.  She continues to take metoprolol 25 mg once daily.  Her blood pressure has been controlled since then.  No chest pain or worsening dyspnea.  She has chronic GERD symptoms.   Past Medical History:  Diagnosis Date   Anemia    h/o   Breast cancer ([place]) 07/2014   T1c,N0, ER/ PR negative, Her 2 neu positive. Wide excision, SLN,  Mammosite, adjuvant chemotherapy.    Bronchitis 04-2014   Coronary artery disease, non-occlusive    a. LHC 4/16 w/ mild nonobstructive disease affecting the LAD with tortuous vessels. CTA in 2017 showed only 30% disease in the LAD. bCarlton Adam MV 6/18: neg for signficant ischemia, EF 54%, low risk scan   Detached retina 1997   Family history of adverse reaction to anesthesia    pts dad has pseudocholinestrace deficieny   Gastritis    GERD (gastroesophageal reflux disease)    History of nuclear stress test    a. MV 6/18: no significant ischemia, normal wall motion, EF 54%. 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 TTE 1/19:  EF 55-60%, normal wall motion, grade 2 diastolic dysfunction, mild aortic regurgitation, mildly calcified mitral annulus, trivial pericardial effusion was identified   OSA (obstructive sleep apnea)    untreated due to lack of insurance   Overactive bladder    Pericarditis 1997   Personal history of chemotherapy    Personal history of radiation therapy    Mammosite   Thyroid cancer ([place]) 12/2016   [facility]   Thyroid nodule 10/24/2016  4.7 cm, October 2018; referred for biopsy    Past Surgical History:  Procedure Laterality Date   BACK SURGERY     L4-5   BREAST BIOPSY Left 07/2014   [facility]   BREAST CYST ASPIRATION Left 12/13/2014   3:00 position 2 cmfn, fat necrosis per Dr. Dwyane Luo notes   BREAST LUMPECTOMY Left 2016   Invasive mammary and DCIS neg margins   BREAST LUMPECTOMY WITH SENTINEL LYMPH NODE BIOPSY Left 08/13/2014   Procedure: BREAST LUMPECTOMY WITH SENTINEL LYMPH NODE BIOPSY;  Surgeon: Robert Bellow, MD;  Location: [facility];   Service: General;  Laterality: Left;   BREAST MAMMOSITE Left 08/30/2014   Procedure: MAMMOSITE BREAST;  Surgeon: Robert Bellow, MD;  Location: [facility];  Service: General;  Laterality: Left;   CESAREAN SECTION     COLONOSCOPY WITH PROPOFOL N/A 04/18/2016   Procedure: COLONOSCOPY WITH PROPOFOL;  Surgeon: Robert Bellow, MD;  Location: [facility];  Service: Endoscopy;  Laterality: N/A;   CORONARY ANGIOPLASTY  April 2016   EYE SURGERY Left 10/16/2015   retina repair   FOOT SURGERY     FORAMINOTOMY 1 LEVEL  [id number]   KNEE SURGERY     LAMINECTOMY  [id number]   PORTACATH PLACEMENT Right 08/30/2014   Procedure: INSERTION PORT-A-CATH;  Surgeon: Robert Bellow, MD;  Location: [facility];  Service: General;  Laterality: Right;   POSTERIOR LAMINECTOMY / DECOMPRESSION LUMBAR SPINE  10/29/2016   L3-5   RETINAL DETACHMENT SURGERY Right 1997   RETINAL DETACHMENT SURGERY  01/01/2018   RIGHT/LEFT HEART CATH AND CORONARY ANGIOGRAPHY Bilateral 02/11/2017   Procedure: RIGHT/LEFT HEART CATH AND CORONARY ANGIOGRAPHY;  Surgeon: Wellington Hampshire, MD;  Location: [facility];  Service: Cardiovascular;  Laterality: Bilateral;   TOTAL THYROIDECTOMY  12/19/2016   TOTAL THYROIDECTOMY Bilateral 12/19/2016   [facility]     Current Outpatient Medications  Medication Sig Dispense Refill   acetaminophen (TYLENOL) 500 MG tablet Take 2 tablets (1,000 mg total) every 6 (six) hours as needed by mouth for mild pain or headache. Maximum dose of acetaminophen 3,000 mg total per day     Ascorbic Acid (VITAMIN C) 100 MG tablet Take 100 mg by mouth daily.     aspirin EC 81 MG tablet Take 81 mg by mouth daily.     atorvastatin (LIPITOR) 20 MG tablet Take 1 tablet by mouth daily.     buPROPion (WELLBUTRIN XL) 150 MG 24 hr tablet Take 450 mg by mouth daily.     cholecalciferol (VITAMIN D3) 25 MCG (1000 UT) tablet Take 1,000 Units by mouth daily.     cyclobenzaprine (FLEXERIL) 5 MG tablet Take 5 mg by mouth 3 (three)  times daily as needed for muscle spasms.      FLUoxetine (PROZAC) 10 MG capsule Take 10 mg by mouth daily.     levothyroxine (SYNTHROID) 112 MCG tablet Take 112 mcg by mouth daily before breakfast.      metoprolol tartrate (LOPRESSOR) 25 MG tablet Take 1 tablet (25 mg total) by mouth daily. 90 tablet 3   Multiple Vitamins-Iron (MULTIVITAMIN/IRON PO) Take by mouth.     nystatin cream (MYCOSTATIN) Apply 1 application topically 2 (two) times daily. Do not get inside mouth 30 g 0   pantoprazole (PROTONIX) 40 MG tablet Take 40 mg by mouth daily.     No current facility-administered medications for this visit.    Allergies:   Reglan [metoclopramide], Paxil [paroxetine hcl], Tape, Tegaderm ag mesh [silver], and Ultram [tramadol]  Social History:  The patient  reports that she has never smoked. She has never used smokeless tobacco. She reports that she does not drink alcohol and does not use drugs.   Family History:  The patient's family history includes Alcohol abuse in her mother; Arrhythmia in her father; Breast cancer (age of onset: 30) in her sister; COPD in her mother; Cancer in her brother, father, maternal uncle, and sister; Congestive Heart Failure in her father; Depression in her father; Diabetes in an other family member; Heart attack in her father; Heart disease (age of onset: 35) in her paternal grandfather; Heart failure in her father; Hypertension in her father and mother; Liver cancer in her brother; Thyroid disease in her mother.    ROS:  Please see the history of present illness.   Otherwise, review of systems are positive for none.   All other systems are reviewed and negative.    PHYSICAL EXAM: VS:  BP 118/80 (BP Location: Right Arm, Patient Position: Sitting, Cuff Size: Large)   Pulse 66   Ht 5' 5.5" (1.664 m)   Wt 233 lb 4 oz (105.8 kg)   LMP 08/26/2012 (Exact Date)   SpO2 98%   BMI 38.22 kg/m  , BMI Body mass index is 38.22 kg/m. GEN: Well nourished, well developed,  in no acute distress  HEENT: normal  Neck: no JVD, carotid bruits, or masses Cardiac: RRR; no murmurs, rubs, or gallops,no edema  Respiratory:  clear to auscultation bilaterally, normal work of breathing GI: soft, nontender, nondistended, + BS MS: no deformity or atrophy  Skin: warm and dry, no rash Neuro:  Strength and sensation are intact Psych: euthymic mood, full affect   EKG:  EKG is ordered today. The ekg ordered today demonstrates normal sinus rhythm, lateral and anterolateral ST changes suggestive of ischemia.  Recent Labs: No results found for requested labs within last 365 days.    Lipid Panel    Component Value Date/Time   CHOL 162 08/22/2016 1033   CHOL 148 11/04/2015 0821   CHOL 132 04/17/2014 0444   TRIG 187 (H) 08/22/2016 1033   TRIG 115 04/17/2014 0444   HDL 47 (L) 08/22/2016 1033   HDL 43 11/04/2015 0821   HDL 37 (L) 04/17/2014 0444   CHOLHDL 3.4 08/22/2016 1033   VLDL 37 (H) 08/22/2016 1033   VLDL 23 04/17/2014 0444   LDLCALC 78 08/22/2016 1033   LDLCALC 75 11/04/2015 0821   LDLCALC 72 04/17/2014 0444      Wt Readings from Last 3 Encounters:  12/06/21 233 lb 4 oz (105.8 kg)  01/31/21 235 lb (106.6 kg)  12/03/19 226 lb (102.5 kg)         ASSESSMENT AND PLAN:  1.  Coronary atherosclerosis without obstructive coronary artery disease: Currently with no anginal symptoms. Recommend continuing aggressive medical therapy. Continue low-dose aspirin.  2. Essential hypertension: Blood pressure is controlled on metoprolol.  Is not entirely clear why her blood pressure was elevated recently before dental cleaning.  I suspect that there was an anxiety component given that her blood pressure normalized after that.  Her blood pressure today is 118/80 and thus I am not making any changes to her medications.  3. Hyperlipidemia: No recent lipid profile available for review.  1 lipid profile from 2019 showed an LDL of 42.  Continue atorvastatin 20 mg daily with a  target LDL of less than 70.   Disposition:   Follow-up in 12 months  Signed,  Kathlyn Sacramento, MD  12/06/2021 10:54 AM    [facility]

## 2021-12-06 NOTE — Patient Instructions (Signed)

## 2023-04-29 ENCOUNTER — Ambulatory Visit

## 2023-05-03 ENCOUNTER — Ambulatory Visit

## 2023-05-06 ENCOUNTER — Ambulatory Visit

## 2023-05-10 ENCOUNTER — Encounter

## 2023-05-15 ENCOUNTER — Encounter

## 2023-05-17 ENCOUNTER — Encounter

## 2023-05-22 ENCOUNTER — Encounter

## 2023-05-24 ENCOUNTER — Encounter

## 2023-05-29 ENCOUNTER — Encounter

## 2023-05-31 ENCOUNTER — Encounter

## 2023-06-03 ENCOUNTER — Ambulatory Visit: Attending: Physician Assistant | Admitting: Physician Assistant

## 2023-06-03 ENCOUNTER — Encounter: Payer: Self-pay | Admitting: Physician Assistant

## 2023-06-03 VITALS — BP 123/82 | HR 86 | Ht 65.5 in | Wt 230.2 lb

## 2023-06-03 DIAGNOSIS — I10 Essential (primary) hypertension: Secondary | ICD-10-CM | POA: Diagnosis not present

## 2023-06-03 DIAGNOSIS — I2510 Atherosclerotic heart disease of native coronary artery without angina pectoris: Secondary | ICD-10-CM

## 2023-06-03 DIAGNOSIS — R4189 Other symptoms and signs involving cognitive functions and awareness: Secondary | ICD-10-CM

## 2023-06-03 DIAGNOSIS — E785 Hyperlipidemia, unspecified: Secondary | ICD-10-CM | POA: Diagnosis not present

## 2023-06-03 DIAGNOSIS — R072 Precordial pain: Secondary | ICD-10-CM

## 2023-06-03 MED ORDER — METOPROLOL TARTRATE 75 MG PO TABS: ORAL_TABLET | ORAL | 0 refills | Status: DC

## 2023-06-03 NOTE — Patient Instructions (Signed)
 Medication Instructions:  Your physician recommends that you continue on your current medications as directed. Please refer to the Current Medication list given to you today.   *If you need a refill on your cardiac medications before your next appointment, please call your pharmacy*  Lab Work: Your provider would like for you to have following labs drawn today (CMP, CBC, Lipid, direct LDL).    Testing/Procedures: Your physician has requested that you have an echocardiogram. Echocardiography is a painless test that uses sound waves to create images of your heart. It provides your doctor with information about the size and shape of your heart and how well your heart's chambers and valves are working.   You may receive an ultrasound enhancing agent through an IV if needed to better visualize your heart during the echo. This procedure takes approximately one hour.  There are no restrictions for this procedure.  This will take place at [address] [facility]) [address], [place] [address]  Please note: We ask at that you not bring children with you during ultrasound (echo/ vascular) testing. Due to room size and safety concerns, children are not allowed in the ultrasound rooms during exams. Our front office staff cannot provide observation of children in our lobby area while testing is being conducted. An adult accompanying a patient to their appointment will only be allowed in the ultrasound room at the discretion of the ultrasound technician under special circumstances. We apologize for any inconvenience.     Your cardiac CT will be scheduled at one of the below locations:    [facility] [address] [place], [place] [address] [phone]  OR   [facility] [address] [place], [place] [address] [phone]   If scheduled at [facility] or [facility], please arrive 15 mins early for check-in and test prep.  There is spacious parking and easy access to the radiology department from the [facility] entrance. Please enter here and check-in with the desk attendant.   Please follow these instructions carefully (unless otherwise directed):  An IV will be required for this test and Nitroglycerin will be given.   On the Night Before the Test: Be sure to Drink plenty of water. Do not consume any caffeinated/decaffeinated beverages or chocolate 12 hours prior to your test. Do not take any antihistamines 12 hours prior to your test.   On the Day of the Test: Drink plenty of water until 1 hour prior to the test. Do not eat any food 1 hour prior to test. You may take your regular medications prior to the test.  Take metoprolol  (Lopressor ) 75 mg in addition to your daily 25 mg two hours prior to test. Patients who wear a continuous glucose monitor MUST remove the device prior to scanning. FEMALES- please wear underwire-free bra if available, avoid dresses & tight clothing      After the Test: Drink plenty of water. After receiving IV contrast, you may experience a mild flushed feeling. This is normal. On occasion, you may experience a mild rash up to 24 hours after the test. This is not dangerous. If this occurs, you can take Benadryl  25 mg, Zyrtec, Claritin, or Allegra and increase your fluid intake. (Patients taking Tikosyn should avoid Benadryl , and may take Zyrtec, Claritin, or Allegra) If you experience trouble breathing, this can be serious. If it is severe call [phone] IMMEDIATELY. If it is mild,  please call our office.  We will call to schedule your test 2-4 weeks out understanding that some insurance companies will need an authorization prior to the service being performed.   For more information and frequently asked questions, please visit our website : [email or web address]  For non-scheduling related  questions, please contact the cardiac imaging nurse navigator should you have any questions/concerns: Cardiac Imaging Nurse Navigators Direct Office Dial: [phone]   For scheduling needs, including cancellations and rescheduling, please call [place], [phone].   Follow-Up: At [facility], you and your health needs are our priority.  As part of our continuing mission to provide you with exceptional heart care, our providers are all part of one team.  This team includes your primary Cardiologist (physician) and Advanced Practice Providers or APPs (Physician Assistants and Nurse Practitioners) who all work together to provide you with the care you need, when you need it.  Your next appointment:   3 month(s)  Provider:   You may see Antionette Kirks, MD or one of the following Advanced Practice Providers on your designated Care Team:   Laneta Pintos, NP Gildardo Labrador, PA-C Varney Gentleman, PA-C Cadence Oakdale, PA-C Ronald Cockayne, NP Morey Ar, NP

## 2023-06-03 NOTE — Progress Notes (Signed)
 Cardiology Office Note    Date:  06/03/2023   ID:  LAVONE BARRIENTES, DOB 05/18/1960, MRN [id number]  PCP:  Elise Guile, MD  Cardiologist:  Antionette Kirks, MD  Electrophysiologist:  None   Chief Complaint: Follow up  History of Present Illness:   Robin Howard is a 63 y.o. female with history of mild nonobstructive CAD, HTN, HLD, chronic back pain, breast cancer diagnosed in 2017 status post lumpectomy and chemotherapy, thyroid  cancer status post thyroidectomy with subsequent hypothyroidism on replacement therapy, obesity, and sleep apnea who presents for follow-up of CAD.   She was admitted to the hospital in 04/2014 with chest pain and mildly elevated troponin with LHC demonstrating mild nonobstructive LAD disease.  EF was normal.  In 2019, she had worsening dyspnea and underwent an [facility] which showed minor luminal irregularities with no evidence of obstructive CAD, normal LV systolic function, and normal right heart catheterization.  She was last seen in the office in 11/2021 and was doing well from a cardiac perspective with chronic GERD symptoms.  It was noted she had recently been seen at her dentist's office for cleaning and found to have significantly elevated blood pressure, though this improved without intervention.  No changes were indicated in cardiac pharmacotherapy.  She comes in today noting an increase in what she believed was reflux.  She is uncertain how long she has been experiencing this epigastric/chest burning sensation.  Discomfort has not improved with the addition of pantoprazole .  Symptoms are not exacerbated by exertion and randomly occur.  They will typically last anywhere from 20 to 60 minutes.  At times there has been some associated shortness of breath and nausea without emesis.  She is worried as if she had a cousin that had similar symptoms and suffered an MI followed by passing away.  Chest discomfort/burning will occur several days per week, last occurring 3 days ago.   No presyncope or syncope.  Currently asymptomatic.  Also notes some cognitive decline.   Labs independently reviewed: 01/2023 - potassium 4.3, BUN 18, serum creatinine 1 06/2022 - albumin 3.7, AST/ALT normal, A1c 5.3, TSH normal 03/2017 - TC 117, TG 163, HDL 42, LDL 42   Past Medical History:  Diagnosis Date   Anemia    h/o   Breast cancer (HCC) 07/2014   T1c,N0, ER/ PR negative, Her 2 neu positive. Wide excision, SLN, Mammosite, adjuvant chemotherapy.    Bronchitis 04-2014   Coronary artery disease, non-occlusive    a. LHC 4/16 w/ mild nonobstructive disease affecting the LAD with tortuous vessels. CTA in 2017 showed only 30% disease in the LAD. b. Lexiscan  MV 6/18: neg for signficant ischemia, EF 54%, low risk scan   Detached retina 1997   Family history of adverse reaction to anesthesia    pts dad has pseudocholinestrace deficieny   Gastritis    GERD (gastroesophageal reflux disease)    History of nuclear stress test    a. MV 6/18: no significant ischemia, normal wall motion, EF 54%. Low risk scan   HTN (hypertension)    Hx of papillary thyroid  carcinoma 02/05/2017   Dec 2018; T3aN0Mx  s/p total thyroidectomy Dec 19, 2016, s/p I-131 RAI 01/02/17   Hypothyroidism    Insomnia    Last menstrual period (LMP) > 10 days ago    LMP 2014   Major depressive disorder, recurrent (HCC)    Migraines    Morbid obesity (HCC)    Orthostatic hypotension    a. TTE 1/19:  EF 55-60%, normal wall motion, grade 2 diastolic dysfunction, mild aortic regurgitation, mildly calcified mitral annulus, trivial pericardial effusion was identified   OSA (obstructive sleep apnea)    untreated due to lack of insurance   Overactive bladder    Pericarditis 1997   Personal history of chemotherapy    Personal history of radiation therapy    Mammosite   Thyroid  cancer (HCC) 12/2016   [facility]   Thyroid  nodule 10/24/2016   4.7 cm, October 2018; referred for biopsy    Past Surgical History:  Procedure Laterality  Date   BACK SURGERY     L4-5   BREAST BIOPSY Left 07/2014   [facility]   BREAST CYST ASPIRATION Left 12/13/2014   3:00 position 2 cmfn, fat necrosis per Dr. Butch Cashing notes   BREAST LUMPECTOMY Left 2016   Invasive mammary and DCIS neg margins   BREAST LUMPECTOMY WITH SENTINEL LYMPH NODE BIOPSY Left 08/13/2014   Procedure: BREAST LUMPECTOMY WITH SENTINEL LYMPH NODE BIOPSY;  Surgeon: Marshall Skeeter, MD;  Location: [facility] ORS;  Service: General;  Laterality: Left;   BREAST MAMMOSITE Left 08/30/2014   Procedure: MAMMOSITE BREAST;  Surgeon: Marshall Skeeter, MD;  Location: [facility] ORS;  Service: General;  Laterality: Left;   CESAREAN SECTION     COLONOSCOPY WITH PROPOFOL  N/A 04/18/2016   Procedure: COLONOSCOPY WITH PROPOFOL ;  Surgeon: Marshall Skeeter, MD;  Location: [facility] ENDOSCOPY;  Service: Endoscopy;  Laterality: N/A;   CORONARY ANGIOPLASTY  April 2016   EYE SURGERY Left 10/16/2015   retina repair   FOOT SURGERY     FORAMINOTOMY 1 LEVEL  [id number]   KNEE SURGERY     LAMINECTOMY  [id number]   PORTACATH PLACEMENT Right 08/30/2014   Procedure: INSERTION PORT-A-CATH;  Surgeon: Marshall Skeeter, MD;  Location: [facility] ORS;  Service: General;  Laterality: Right;   POSTERIOR LAMINECTOMY / DECOMPRESSION LUMBAR SPINE  10/29/2016   L3-5   RETINAL DETACHMENT SURGERY Right 1997   RETINAL DETACHMENT SURGERY  01/01/2018   RIGHT/LEFT HEART CATH AND CORONARY ANGIOGRAPHY Bilateral 02/11/2017   Procedure: RIGHT/LEFT HEART CATH AND CORONARY ANGIOGRAPHY;  Surgeon: Wenona Hamilton, MD;  Location: [facility] INVASIVE CV [facility];  Service: Cardiovascular;  Laterality: Bilateral;   TOTAL THYROIDECTOMY  12/19/2016   TOTAL THYROIDECTOMY Bilateral 12/19/2016   [facility]    Current Medications: Current Meds  Medication Sig   acetaminophen  (TYLENOL ) 500 MG tablet Take 2 tablets (1,000 mg total) every 6 (six) hours as needed by mouth for mild pain or headache. Maximum dose of acetaminophen  3,000 mg total per day   Ascorbic Acid (VITAMIN C)  100 MG tablet Take 100 mg by mouth daily.   aspirin  EC 81 MG tablet Take 81 mg by mouth daily.   atorvastatin (LIPITOR) 20 MG tablet Take 1 tablet by mouth daily.   buPROPion  (WELLBUTRIN  XL) 150 MG 24 hr tablet Take 450 mg by mouth daily.   cholecalciferol (VITAMIN D3) 25 MCG (1000 UT) tablet Take 1,000 Units by mouth daily.   Cyanocobalamin (VITAMIN B-12 PO) Take by mouth. 2 gummies once a day   cyclobenzaprine  (FLEXERIL ) 5 MG tablet Take 5 mg by mouth 3 (three) times daily as needed for muscle spasms.    FLUoxetine (PROZAC) 10 MG capsule Take 10 mg by mouth daily.   levothyroxine  (SYNTHROID ) 112 MCG tablet Take 112 mcg by mouth daily before breakfast.    metoprolol  tartrate (LOPRESSOR ) 25 MG tablet Take 1 tablet (25 mg total) by mouth daily.   metoprolol  tartrate 75  MG TABS TAKE 1 TABLET 2 HR PRIOR TO CARDIAC PROCEDURE   Multiple Vitamins-Iron (MULTIVITAMIN/IRON PO) Take by mouth.   nystatin  cream (MYCOSTATIN ) Apply 1 application topically 2 (two) times daily. Do not get inside mouth   pantoprazole  (PROTONIX ) 40 MG tablet Take 40 mg by mouth daily.   sertraline (ZOLOFT) 50 MG tablet Take 50 mg by mouth daily.   valsartan (DIOVAN) 80 MG tablet Take 80 mg by mouth daily.    Allergies:   Metoclopramide, Paxil [paroxetine hcl], Tape, Tegaderm ag mesh [silver], and Ultram [tramadol]   Social History   Socioeconomic History   Marital status: Divorced    Spouse name: Not on file   Number of children: Not on file   Years of education: Not on file   Highest education level: Not on file  Occupational History   Not on file  Tobacco Use   Smoking status: Never   Smokeless tobacco: Never  Vaping Use   Vaping status: Never Used  Substance and Sexual Activity   Alcohol use: No   Drug use: No   Sexual activity: Not Currently  Other Topics Concern   Not on file  Social History Narrative   Not on file   Social Drivers of Health   Financial Resource Strain: Low Risk  (07/05/2021)    Received from [facility], [facility]   Overall Financial Resource Strain (CARDIA)    Difficulty of Paying Living Expenses: Not very hard  Food Insecurity: No Food Insecurity (07/05/2021)   Received from [facility], [facility]   Hunger Vital Sign    Worried About Running Out of Food in the Last Year: Never true    Ran Out of Food in the Last Year: Never true  Transportation Needs: No Transportation Needs (07/05/2021)   Received from [facility], [facility]   PRAPARE - Transportation    Lack of Transportation (Medical): No    Lack of Transportation (Non-Medical): No  Physical Activity: Not on file  Stress: Not on file  Social Connections: Not on file     Family History:  The patient's family history includes Alcohol abuse in her mother; Arrhythmia in her father; Breast cancer (age of onset: 76) in her sister; COPD in her mother; Cancer in her brother, father, maternal uncle, and sister; Congestive Heart Failure in her father; Depression in her father; Diabetes in an other family member; Heart attack in her father; Heart disease (age of onset: 80) in her paternal grandfather; Heart failure in her father; Hypertension in her father and mother; Liver cancer in her brother; Thyroid  disease in her mother.  ROS:   12-point review of systems is negative unless otherwise noted in the HPI.   EKGs/Labs/Other Studies Reviewed:    Studies reviewed were summarized above. The additional studies were reviewed today:  [facility] 02/11/2017: Prox LAD lesion is 15% stenosed. The left ventricular systolic function is normal. LV end diastolic pressure is normal. The left ventricular ejection fraction is 55-65% by visual estimate.   1.  Minor irregularities with no evidence of obstructive coronary artery disease. 2.  Normal LV systolic function. 3.  Right heart catheterization showed normal filling pressures, normal pulmonary pressure and normal cardiac output.  RA pressure was 2,  RV pressure was 30 over 8 mmHg, pulmonary capillary wedge pressure was 10 mmHg, PA pressure was 27/13 with a mean of 19 mmHg.   Recommendations: Shortness of breath and chest pain do not seem to be  cardiac in origin. __________   2D echo 02/05/2017: - Procedure narrative: Transthoracic echocardiography. Image    quality was suboptimal. The study was technically difficult, as a    result of poor acoustic windows and body habitus.  - Left ventricle: The cavity size was normal. Wall thickness was    increased in a pattern of mild LVH. Systolic function was normal.    The estimated ejection fraction was in the range of 55% to 65%.    The study is not technically sufficient to allow evaluation of LV    diastolic function.  - Right ventricle: The cavity size was normal. Wall thickness was    normal. Systolic function was normal.  __________   Lexiscan  MPI 06/27/2016: Pharmacological myocardial perfusion imaging study with no significant ischemia Normal wall motion, EF estimated at 54% No EKG changes concerning for ischemia at peak stress or in recovery. Nonspecific ST and T wave abnormality at rest Low risk scan __________   2D echo 01/25/2015: - Left ventricle: Systolic function was normal. The estimated    ejection fraction was 50%. Doppler parameters are consistent with    abnormal left ventricular relaxation (grade 1 diastolic    dysfunction).  - Aortic valve: Valve area (Vmax): 2.12 cm^2.  - Mitral valve: There was mild regurgitation.  - Right atrium: The atrium was mildly dilated.  - Atrial septum: No defect or patent foramen ovale was identified.   Impressions:   - Normal wall motion exept mild septal hypokinesis, with    borderrline LVEF and mild diastolic dysfunction.   EKG:  EKG is ordered today.  The EKG ordered today demonstrates NSR, 86 bpm, anterolateral ST-T changes consistent with prior tracings  Recent Labs: No results found for requested labs within last 365  days.  Recent Lipid Panel    Component Value Date/Time   CHOL 162 08/22/2016 1033   CHOL 148 11/04/2015 0821   CHOL 132 04/17/2014 0444   TRIG 187 (H) 08/22/2016 1033   TRIG 115 04/17/2014 0444   HDL 47 (L) 08/22/2016 1033   HDL 43 11/04/2015 0821   HDL 37 (L) 04/17/2014 0444   CHOLHDL 3.4 08/22/2016 1033   VLDL 37 (H) 08/22/2016 1033   VLDL 23 04/17/2014 0444   LDLCALC 78 08/22/2016 1033   LDLCALC 75 11/04/2015 0821   LDLCALC 72 04/17/2014 0444    PHYSICAL EXAM:    VS:  BP 123/82   Pulse 86   Ht 5' 5.5" (1.664 m)   Wt 230 lb 3.2 oz (104.4 kg)   LMP 08/26/2012 (Exact Date)   SpO2 98%   BMI 37.72 kg/m   BMI: Body mass index is 37.72 kg/m.  Physical Exam Vitals reviewed.  Constitutional:      Appearance: She is well-developed.  HENT:     Head: Normocephalic and atraumatic.  Eyes:     General:        Right eye: No discharge.        Left eye: No discharge.  Cardiovascular:     Rate and Rhythm: Normal rate and regular rhythm.     Pulses:          Posterior tibial pulses are 2+ on the right side and 2+ on the left side.     Heart sounds: Normal heart sounds, S1 normal and S2 normal. Heart sounds not distant. No midsystolic click and no opening snap. No murmur heard.    No friction rub.  Pulmonary:     Effort: Pulmonary effort  is normal. No respiratory distress.     Breath sounds: Normal breath sounds. No decreased breath sounds, wheezing, rhonchi or rales.  Chest:     Chest wall: No tenderness.  Musculoskeletal:     Cervical back: Normal range of motion.     Right lower leg: No edema.     Left lower leg: No edema.  Skin:    General: Skin is warm and dry.     Nails: There is no clubbing.  Neurological:     Mental Status: She is alert and oriented to person, place, and time.  Psychiatric:        Speech: Speech normal.        Behavior: Behavior normal.        Thought Content: Thought content normal.        Judgment: Judgment normal.     Wt Readings from  Last 3 Encounters:  06/03/23 230 lb 3.2 oz (104.4 kg)  12/06/21 233 lb 4 oz (105.8 kg)  01/31/21 235 lb (106.6 kg)     ASSESSMENT & PLAN:   Nonobstructive CAD with precordial pain: Currently without symptoms of angina.  Prior cardiac cath in 2019 that showed minimal nonobstructive CAD.  In the setting of escalation of chest burning sensation schedule coronary CTA and echo.  Continue aspirin  81 mg and atorvastatin 20 mg.  Check CBC, CMP, and lipid panel/direct LDL.  HTN: Blood pressure is well-controlled in the office today.  She remains on valsartan and metoprolol .  HLD: LDL 42 in 2019.  She remains on atorvastatin 20 mg.  Check CMP, lipid panel, and direct LDL.  Cognitive decline: Follow-up with PCP.    Disposition: F/u with Dr. Alvenia Aus or an APP in 3 months.   Medication Adjustments/Labs and Tests Ordered: Current medicines are reviewed at length with the patient today.  Concerns regarding medicines are outlined above. Medication changes, Labs and Tests ordered today are summarized above and listed in the Patient Instructions accessible in Encounters.   Signed, Varney Gentleman, PA-C 06/03/2023 5:16 PM     [facility] [place] [address] [place], [place] [address] [phone]

## 2023-06-04 ENCOUNTER — Ambulatory Visit: Payer: Self-pay | Admitting: Physician Assistant

## 2023-06-04 LAB — CBC
Hematocrit: 38 % (ref 34.0–46.6)
Hemoglobin: 12.3 g/dL (ref 11.1–15.9)
MCH: 30.4 pg (ref 26.6–33.0)
MCHC: 32.4 g/dL (ref 31.5–35.7)
MCV: 94 fL (ref 79–97)
Platelets: 256 10*3/uL (ref 150–450)
RBC: 4.04 x10E6/uL (ref 3.77–5.28)
RDW: 12.7 % (ref 11.7–15.4)
WBC: 6.6 10*3/uL (ref 3.4–10.8)

## 2023-06-04 LAB — COMPREHENSIVE METABOLIC PANEL WITH GFR
ALT: 17 IU/L (ref 0–32)
AST: 21 IU/L (ref 0–40)
Albumin: 4.1 g/dL (ref 3.9–4.9)
Alkaline Phosphatase: 133 IU/L — ABNORMAL HIGH (ref 44–121)
BUN/Creatinine Ratio: 10 — ABNORMAL LOW (ref 12–28)
BUN: 10 mg/dL (ref 8–27)
Bilirubin Total: 0.5 mg/dL (ref 0.0–1.2)
CO2: 22 mmol/L (ref 20–29)
Calcium: 8.5 mg/dL — ABNORMAL LOW (ref 8.7–10.3)
Chloride: 104 mmol/L (ref 96–106)
Creatinine, Ser: 1.04 mg/dL — ABNORMAL HIGH (ref 0.57–1.00)
Globulin, Total: 1.7 g/dL (ref 1.5–4.5)
Glucose: 110 mg/dL — ABNORMAL HIGH (ref 70–99)
Potassium: 4.7 mmol/L (ref 3.5–5.2)
Sodium: 140 mmol/L (ref 134–144)
Total Protein: 5.8 g/dL — ABNORMAL LOW (ref 6.0–8.5)
eGFR: 61 mL/min/{1.73_m2} (ref >59)

## 2023-06-04 LAB — LIPID PANEL
Chol/HDL Ratio: 2.2 ratio (ref 0.0–4.4)
Cholesterol, Total: 116 mg/dL (ref 100–199)
HDL: 53 mg/dL (ref >39)
LDL Chol Calc (NIH): 42 mg/dL (ref 0–99)
Triglycerides: 119 mg/dL (ref 0–149)
VLDL Cholesterol Cal: 21 mg/dL (ref 5–40)

## 2023-06-04 LAB — LDL CHOLESTEROL, DIRECT: LDL Direct: 49 mg/dL (ref 0–99)

## 2023-06-05 ENCOUNTER — Encounter

## 2023-06-07 ENCOUNTER — Encounter

## 2023-06-12 ENCOUNTER — Encounter

## 2023-06-14 ENCOUNTER — Ambulatory Visit: Attending: Physician Assistant

## 2023-06-14 ENCOUNTER — Encounter

## 2023-06-14 DIAGNOSIS — R072 Precordial pain: Secondary | ICD-10-CM | POA: Diagnosis not present

## 2023-06-18 LAB — ECHOCARDIOGRAM COMPLETE
AR max vel: 2.46 cm2
AV Area VTI: 2.6 cm2
AV Area mean vel: 2.48 cm2
AV Mean grad: 3 mmHg
AV Peak grad: 5 mmHg
Ao pk vel: 1.12 m/s
Area-P 1/2: 2.73 cm2
Calc EF: 48 %
S' Lateral: 3.1 cm
Single Plane A2C EF: 54.9 %
Single Plane A4C EF: 50.8 %

## 2023-06-26 ENCOUNTER — Encounter (HOSPITAL_COMMUNITY): Payer: Self-pay

## 2023-06-27 ENCOUNTER — Ambulatory Visit
Admission: RE | Admit: 2023-06-27 | Discharge: 2023-06-27 | Disposition: A | Discharge status: Home / Self Care | Source: Ambulatory Visit | Attending: Physician Assistant | Admitting: Physician Assistant

## 2023-06-27 DIAGNOSIS — R072 Precordial pain: Secondary | ICD-10-CM | POA: Insufficient documentation

## 2023-06-27 MED ORDER — NITROGLYCERIN 0.4 MG SL SUBL
0.8000 mg | SUBLINGUAL_TABLET | Freq: Once | SUBLINGUAL | Status: AC
  Administered 2023-06-27: 0.8 mg via SUBLINGUAL
  Filled 2023-06-27: qty 25

## 2023-06-27 MED ORDER — IOHEXOL 350 MG/ML SOLN
80.0000 mL | Freq: Once | INTRAVENOUS | Status: AC | PRN
  Administered 2023-06-27: 80 mL via INTRAVENOUS

## 2023-06-27 NOTE — Progress Notes (Signed)
 Patient tolerated procedure well. Ambulate w/o difficulty. Denies any lightheadedness or being dizzy. Pt denies any pain at this time. P is encouraged to drink additional water throughout the day and reason explained to patient. Patient verbalized understanding and all questions answered. ABC intact. No further needs at this time. Discharge from procedure area w/o issues.

## 2023-07-01 NOTE — Therapy (Signed)
 OUTPATIENT PHYSICAL THERAPY TMJ EVALUATION   Patient Name: Robin Howard MRN: [id number] DOB:[birth year], 63 y.o., female Today's Date: 07/03/2023   PT End of Session - 07/02/23 1018     Visit Number 1    Number of Visits 9    Date for PT Re-Evaluation 08/27/23    Authorization Type eval: 07/02/23    PT Start Time 1020    PT Stop Time 1100    PT Time Calculation (min) 40 min    Activity Tolerance Patient tolerated treatment well    Behavior During Therapy [facility] for tasks assessed/performed         Past Medical History:  Diagnosis Date   Anemia    h/o   Breast cancer (HCC) 07/2014   T1c,N0, ER/ PR negative, Her 2 neu positive. Wide excision, SLN, Mammosite, adjuvant chemotherapy.    Bronchitis 04-2014   Coronary artery disease, non-occlusive    a. LHC 4/16 w/ mild nonobstructive disease affecting the LAD with tortuous vessels. CTA in 2017 showed only 30% disease in the LAD. b. Lexiscan  MV 6/18: neg for signficant ischemia, EF 54%, low risk scan   Detached retina 1997   Family history of adverse reaction to anesthesia    pts dad has pseudocholinestrace deficieny   Gastritis    GERD (gastroesophageal reflux disease)    History of nuclear stress test    a. MV 6/18: no significant ischemia, normal wall motion, EF 54%. Low risk scan   HTN (hypertension)    Hx of papillary thyroid  carcinoma 02/05/2017   Dec 2018; T3aN0Mx  s/p total thyroidectomy Dec 19, 2016, s/p I-131 RAI 01/02/17   Hypothyroidism    Insomnia    Last menstrual period (LMP) > 10 days ago    LMP 2014   Major depressive disorder, recurrent (HCC)    Migraines    Morbid obesity (HCC)    Orthostatic hypotension    a. TTE 1/19:  EF 55-60%, normal wall motion, grade 2 diastolic dysfunction, mild aortic regurgitation, mildly calcified mitral annulus, trivial pericardial effusion was identified   OSA (obstructive sleep apnea)    untreated due to lack of insurance   Overactive bladder    Pericarditis 1997   Personal  history of chemotherapy    Personal history of radiation therapy    Mammosite   Thyroid  cancer (HCC) 12/2016   [facility]   Thyroid  nodule 10/24/2016   4.7 cm, October 2018; referred for biopsy   Past Surgical History:  Procedure Laterality Date   BACK SURGERY     L4-5   BREAST BIOPSY Left 07/2014   [facility]   BREAST CYST ASPIRATION Left 12/13/2014   3:00 position 2 cmfn, fat necrosis per Dr. Butch Cashing notes   BREAST LUMPECTOMY Left 2016   Invasive mammary and DCIS neg margins   BREAST LUMPECTOMY WITH SENTINEL LYMPH NODE BIOPSY Left 08/13/2014   Procedure: BREAST LUMPECTOMY WITH SENTINEL LYMPH NODE BIOPSY;  Surgeon: Marshall Skeeter, MD;  Location: [facility] ORS;  Service: General;  Laterality: Left;   BREAST MAMMOSITE Left 08/30/2014   Procedure: MAMMOSITE BREAST;  Surgeon: Marshall Skeeter, MD;  Location: [facility] ORS;  Service: General;  Laterality: Left;   CESAREAN SECTION     COLONOSCOPY WITH PROPOFOL  N/A 04/18/2016   Procedure: COLONOSCOPY WITH PROPOFOL ;  Surgeon: Marshall Skeeter, MD;  Location: [facility] ENDOS[facility];  Service: Endoscopy;  Laterality: N/A;   CORONARY ANGIOPLASTY  April 2016   EYE SURGERY Left 10/16/2015   retina repair   FOOT SURGERY  FORAMINOTOMY 1 LEVEL  [id number]   KNEE SURGERY     LAMINECTOMY  [id number]   PORTACATH PLACEMENT Right 08/30/2014   Procedure: INSERTION PORT-A-CATH;  Surgeon: Marshall Skeeter, MD;  Location: [facility] OR[facility];  Service: General;  Laterality: Right;   POSTERIOR LAMINECTOMY / DECOMPRESSION LUMBAR SPINE  10/29/2016   L3-5   RETINAL DETACHMENT SURGERY Right 1997   RETINAL DETACHMENT SURGERY  01/01/2018   RIGHT/LEFT HEART CATH AND CORONARY ANGIOGRAPHY Bilateral 02/11/2017   Procedure: RIGHT/LEFT HEART CATH AND CORONARY ANGIOGRAPHY;  Surgeon: Wenona Hamilton, MD;  Location: [facility];  Service: Cardiovascular;  Laterality: Bilateral;   TOTAL THYROIDECTOMY  12/19/2016   TOTAL THYROIDECTOMY Bilateral 12/19/2016   [facility]   Patient Active Problem List    Diagnosis Date Noted   Allergic rhinitis 03/20/2018   Cervical cancer screening 03/20/2018   Right retinal detachment 01/02/2018   Breast nodule 06/21/2017   Sleep disturbance 05/16/2017   Hx of papillary thyroid  carcinoma 02/05/2017   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   Thyroid  cancer (HCC) 01/02/2017   Lumbar radiculopathy 10/29/2016   Medication monitoring encounter 08/22/2016   Cervical spondylosis with radiculopathy 07/23/2016   Perioral dermatitis 05/18/2016   Obesity (BMI 35.0-39.9 without comorbidity) 03/04/2016   Hypocalcemia 09/22/2015   Coronary artery disease    Headache 03/30/2015   NSTEMI (non-ST elevated myocardial infarction) (HCC) 01/25/2015   Angina pectoris (HCC) 01/24/2015   Noninfective gastroenteritis and colitis, unspecified 09/23/2014   Breast cancer of lower-outer quadrant of left female breast (HCC) 08/10/2014   Migraine 07/15/2014   Fatigue 07/15/2014   Major depressive disorder, recurrent (HCC)    HTN (hypertension)    Overactive bladder    Hypothyroidism    OSA (obstructive sleep apnea)    GERD (gastroesophageal reflux disease)    Chronic headache    Morbid obesity (HCC)     PCP: Elise Guile, MD  REFERRING PROVIDER: Correne Dillon, DMD  REFERRING DIAGNOSIS: M26.629 (ICD-10-CM) - TMJ arthralgia   THERAPY DIAG: Cervicalgia  RATIONALE FOR EVALUATION AND TREATMENT: Rehabilitation  ONSET DATE: 12/2022  FOLLOW UP APPT WITH PROVIDER: Yes    SUBJECTIVE:                                                                                                                                                                                         Chief Complaint: L TMJ pain  Pertinent History Pt reports that she woke up one morning in December 2024 with L TMJ pain. Insidious worsening without any known trauma to head or jaw. It hurts all the way in my L ear. Initially she was having an opening restriction with popping in  her L TMJ but that eventually  resolved. However the soreness has persisted and she gets occasional crackling sounds with opening. She started taking cyclobenzaprine  BID which has helped slightly with her pain but when she ran out of the cyclobenzaprine  last week her pain did not worsen. She does complain of dry mouth when taking the muscle relaxers and has been sucking on hard candy throughout the day. She also drinks a smoothie through a straw every morning. Pt has noticed that she clenches her jaw occasionally during the day.  She has a history of cluster migraines with an increase in her headache frequency this year. She denies any worsening of her TMJ pain prior to, during, or after headaches. She also reports recent dizziness/lightheadedness which she attributes to taking the muscle relaxers. Symptoms occur with positional changes. Recently started sertraline (recently increased from 76m to 75mg ) for mood disorder. No change in TMJ symptoms since starting sertraline. She has tried meloxicam oral and diclofenac  topical for her TMJ pain but reports no improvement. She complains of chronic low back and neck pain as well as occasional bilateral hand numbness with reading. I think I probably have fibromyalgia but have never been diagnosed. PMH includes Breast CA 2016, thyroid  CA 2018, and small non-ST elevation myocardial infarctions in April 2016  and January 2017. Cardiac catheterization in April 2016 showed mild nonobstructive disease affecting the LAD with tortuous vessels. CTA in 2017 showed only 30% disease in the LAD. She reports recent cardiac/vascular imaging with cardiology and she is awaiting results.   Cardiac/Coronary CTA 06/27/23:  IMPRESSION: 1. Coronary calcium score of 242. This was 95th percentile for age and sex matched control. 2. Normal coronary origin with right dominance. 3. Mild proximal LAD stenosis (25-49%). 4. Minimal RCA stenosis (<25%). 5. CAD-RADS 2. Mild non-obstructive CAD (25-49%).  Consider non-atherosclerotic causes of chest pain. Consider preventive therapy and risk factor modification.  Last Dental Examination and imaging: Last year pt reports regular imaging at the dental clinic but denies any significant findings; Recent dental procedures or orthodontics: No Pain location: In the ear L canal Pain Severity: Present: 2/10, Best: 0/10, Worst: 8/10 Pain quality: Dull, throbbing, aching pain. No sharp pain reported Radiating pain: No  Numbness/Tingling: No Aggravating factors: Pt unsure what aggravates her pain. Denies worsening pain with chewing. Occasionally slightly worse when first waking; Easing factors: No known easing factors with the exception of possible improvement in pain with muscle relaxers. No improvement with heat or additional medications.  Clicking, catching, or crepitus during chewing: Yes, denies locking but reports crackling sound when opening mouth occasionally; 24 hour pain behavior: Pain doesn't wake patient from sleep. Occasionally sore first thing in the morning but otherwise no specific changes over 24 hour period; History of grinding teeth: No Recent or remote jaw/face/neck trauma, injury, or pain: History of neck pain for at least the last 6 years. History of hip and knee arthritis.  Falls: Has patient fallen in last 6 months? No History of prior physical therapy for this issue: No  Imaging: No, pt reports that [facility] is waiting to hear from insurance to approve CT scan and oral appliance; Progression (improving, worsening, unchanged): improving History of headaches/migraines: Yes Ear symptoms (tinnitus, fullness, pain): Yes, pain is in the L ear canal. Tinnitus but pt unable to localize laterality. Denies drainage from ear, hearing loss, or aural fullness; Chest pain: Yes, mid sternal pain which feels like heartburn. 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  Hobbies: Reading, watching television, painting, swimming Red flags: Positive personal history of breast and thyroid  cancer. Pt reports intermittent nausea (gallbladder and Upper GI WNL, uses promethazine)  Negative for chills/fever, night sweats, vomiting, unexplained weight gain/loss;  Precautions: None  Weight Bearing Restrictions: No  Patient Goals: Decrease jaw pain   OBJECTIVE  Patient Surveys  None  Mental Status Patient is oriented to person, place and time.  Recent memory is intact.  Remote memory is intact.  Attention span and concentration are intact.  Expressive speech is intact.  Patient's fund of knowledge is within normal limits for educational level.  Cranial Nerves Deferred   MUSCULOSKELETAL: Tremor: None Bulk: Normal Tone: Normal Facial Symmetry: Face appears grossly symmetrical  Posture Forward head and rounded shoulders;  Cervical Screen AROM: Limited in all directions, especially cervical extension as well as lateral flexion bilaterally. Posterior L aural pain reported with cervical extension, R lateral flexion and L rotation; Isometrics: Full strength in all directions. Pain only with resisted L rotation Passive Accessory Intervertebral Motion (PAIVM), central and unilateral (bilaterally): Deferred Spurlings A (ipsilateral lateral flexion/axial compression): R: Negative L: Negative Spurlings B (ipsilateral lateral flexion/contralateral rotation/axial compression): R: Not examined L: Not examined Cervical Distraction Test: Not examined  Cervical Fexion-Rotation Test: Not examined  Hoffman Sign (cervical cord compression): R: Negative L: Negative  Dermatome/Myotome Screen Deferred  Sensation Deferred  Reflexes Deferred  Palpation Location LEFT  RIGHT           Temporomandibular Joint (posterior, superior, anterior) 1 0  Temporalis (anterior,  middle, posterior fibers) 1 0  Temporalis Tendon Insertion 0 0  Masseter (Zygoma, Body, Lateral surface of angle of mandible) 0 0  Medial ptyergoid 0 0  Frontal Sinus 0 0  Maxillary Sinus 0 0  SCM 0 0  Upper Trapezius 0 0  Subocciptials 0 0  Graded on 0-4 scale (0 = no pain, 1 = pain, 2 = pain with wincing/grimacing/flinching, 3 = pain with withdrawal, 4 = unwilling to allow palpation)  Mandibular AROM Resting Dental Alignment/Occlusion (Overbite, underbite, overjet, crossbite): Overbite of 6mm Mandible Depression (40-9mm): 38mm Mandible Protrusion (3-27mm): 4mm Mandible Lateral Excursion (10-66mm): R: 10mm L: 11mm Audible joint sounds (crepitus or clicking with stethoscope with opening, lateral deviation, and bite): Negative Reciprocal Click (palpation, click with both opening/closing): Negative Deviation of Mouth During Opening: Positive for deviation to the L Deflection of Mouth at End Range: Return to midline at end range   Strength Deferred  Passive Accessory Joint Motion (PAIVM) Deferred  Special Tests Deferred  Beighton scale Deferred   TODAY'S TREATMENT  Deferred   PATIENT EDUCATION:  Education details: Plan of care, TMD handout Person educated: Patient Education method: Explanation and handout Education comprehension: verbalized understanding   HOME EXERCISE PROGRAM: None currently,    ASSESSMENT:  CLINICAL IMPRESSION: Patient is a 63 y.o. female who was seen today for physical therapy evaluation and treatment for L TMJ pain.   OBJECTIVE IMPAIRMENTS: pain.   ACTIVITY LIMITATIONS: chewing  PARTICIPATION LIMITATIONS: eating out  PERSONAL FACTORS: Age, Behavior pattern, Past/current experiences, Time since onset of injury/illness/exacerbation, and 3+ comorbidities: hypothyroid, migraines, OSA, chronic neck/low back pain are also affecting patient's functional outcome.   REHAB POTENTIAL: Good  CLINICAL DECISION MAKING:  Stable/uncomplicated  EVALUATION COMPLEXITY: Low   GOALS: Goals reviewed with patient? No  SHORT TERM GOALS: Target date: 07/30/2023  Pt will be independent  with HEP to decrease L TMJ and neck pain to improve pain-free function at home and while eating Baseline:  Goal status: INITIAL   LONG TERM GOALS: Target date: 08/27/2023  Pt will decrease worst L TMJ pain to below 4/10 on the NPRS in order to demonstrate clinically significant reduction in TMJ pain. Baseline: worst: 8/10 Goal status: INITIAL  2.  Pt will decrease NDI score by at least 19% in order demonstrate clinically significant reduction in neck pain/disability. Baseline: To be completed Goal status: INITIAL  3.  Pt will report at least 70% improvement in L TMJ symptoms in order to improve pain-free chewing, talking, and AM pain       Baseline:  Goal status: INITIAL   PLAN: PT FREQUENCY: 1x/week  PT DURATION: 8 weeks  PLANNED INTERVENTIONS: Therapeutic exercises, Therapeutic activity, Neuromuscular re-education, Balance training, Gait training, Patient/Family education, Joint manipulation, Joint mobilization, Vestibular training, Canalith repositioning, Dry Needling, Electrical stimulation, Spinal manipulation, Spinal mobilization, Cryotherapy, Moist heat, Taping, Traction, Ultrasound, Ionotophoresis 4mg /ml Dexamethasone , and Manual therapy  PLAN FOR NEXT SESSION: palpation, cervical and TMJ PAM, dermatome/myotome testing, cranial nerve testing, mandibular strength testing, special testing  Sherill Ding Sruti Ayllon PT, DPT, GCS  Hurshel Bouillon 07/03/2023, 9:12 AM

## 2023-07-02 ENCOUNTER — Ambulatory Visit: Attending: Dentistry

## 2023-07-02 DIAGNOSIS — M542 Cervicalgia: Secondary | ICD-10-CM | POA: Diagnosis present

## 2023-07-08 NOTE — Therapy (Incomplete)
 OUTPATIENT PHYSICAL THERAPY TMJ TREATMENT   Patient Name: Robin Howard MRN: [id number] DOB:[birth year], 63 y.o., female Today's Date: 07/08/2023    Past Medical History:  Diagnosis Date   Anemia    h/o   Breast cancer (HCC) 07/2014   T1c,N0, ER/ PR negative, Her 2 neu positive. Wide excision, SLN, Mammosite, adjuvant chemotherapy.    Bronchitis 04-2014   Coronary artery disease, non-occlusive    a. LHC 4/16 w/ mild nonobstructive disease affecting the LAD with tortuous vessels. CTA in 2017 showed only 30% disease in the LAD. b. Lexiscan  MV 6/18: neg for signficant ischemia, EF 54%, low risk scan   Detached retina 1997   Family history of adverse reaction to anesthesia    pts dad has pseudocholinestrace deficieny   Gastritis    GERD (gastroesophageal reflux disease)    History of nuclear stress test    a. MV 6/18: no significant ischemia, normal wall motion, EF 54%. Low risk scan   HTN (hypertension)    Hx of papillary thyroid  carcinoma 02/05/2017   Dec 2018; T3aN0Mx  s/p total thyroidectomy Dec 19, 2016, s/p I-131 RAI 01/02/17   Hypothyroidism    Insomnia    Last menstrual period (LMP) > 10 days ago    LMP 2014   Major depressive disorder, recurrent (HCC)    Migraines    Morbid obesity (HCC)    Orthostatic hypotension    a. TTE 1/19:  EF 55-60%, normal wall motion, grade 2 diastolic dysfunction, mild aortic regurgitation, mildly calcified mitral annulus, trivial pericardial effusion was identified   OSA (obstructive sleep apnea)    untreated due to lack of insurance   Overactive bladder    Pericarditis 1997   Personal history of chemotherapy    Personal history of radiation therapy    Mammosite   Thyroid  cancer (HCC) 12/2016   [facility]   Thyroid  nodule 10/24/2016   4.7 cm, October 2018; referred for biopsy   Past Surgical History:  Procedure Laterality Date   BACK SURGERY     L4-5   BREAST BIOPSY Left 07/2014   [facility]   BREAST CYST ASPIRATION Left 12/13/2014   3:00  position 2 cmfn, fat necrosis per Dr. Fredirick notes   BREAST LUMPECTOMY Left 2016   Invasive mammary and DCIS neg margins   BREAST LUMPECTOMY WITH SENTINEL LYMPH NODE BIOPSY Left 08/13/2014   Procedure: BREAST LUMPECTOMY WITH SENTINEL LYMPH NODE BIOPSY;  Surgeon: Reyes LELON Cota, MD;  Location: [facility];  Service: General;  Laterality: Left;   BREAST MAMMOSITE Left 08/30/2014   Procedure: MAMMOSITE BREAST;  Surgeon: Reyes LELON Cota, MD;  Location: [facility];  Service: General;  Laterality: Left;   CESAREAN SECTION     COLONOSCOPY WITH PROPOFOL  N/A 04/18/2016   Procedure: COLONOSCOPY WITH PROPOFOL ;  Surgeon: Reyes LELON Cota, MD;  Location: [facility];  Service: Endoscopy;  Laterality: N/A;   CORONARY ANGIOPLASTY  April 2016   EYE SURGERY Left 10/16/2015   retina repair   FOOT SURGERY     FORAMINOTOMY 1 LEVEL  [id number]   KNEE SURGERY     LAMINECTOMY  [id number]   PORTACATH PLACEMENT Right 08/30/2014   Procedure: INSERTION PORT-A-CATH;  Surgeon: Reyes LELON Cota, MD;  Location: [facility];  Service: General;  Laterality: Right;   POSTERIOR LAMINECTOMY / DECOMPRESSION LUMBAR SPINE  10/29/2016   L3-5   RETINAL DETACHMENT SURGERY Right 1997   RETINAL DETACHMENT SURGERY  01/01/2018   RIGHT/LEFT HEART CATH AND CORONARY ANGIOGRAPHY Bilateral 02/11/2017  Procedure: RIGHT/LEFT HEART CATH AND CORONARY ANGIOGRAPHY;  Surgeon: Darron Deatrice LABOR, MD;  Location: [facility];  Service: Cardiovascular;  Laterality: Bilateral;   TOTAL THYROIDECTOMY  12/19/2016   TOTAL THYROIDECTOMY Bilateral 12/19/2016   [facility]   Patient Active Problem List   Diagnosis Date Noted   Allergic rhinitis 03/20/2018   Cervical cancer screening 03/20/2018   Right retinal detachment 01/02/2018   Breast nodule 06/21/2017   Sleep disturbance 05/16/2017   Hx of papillary thyroid  carcinoma 02/05/2017   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   Thyroid  cancer (HCC) 01/02/2017   Lumbar radiculopathy 10/29/2016    Medication monitoring encounter 08/22/2016   Cervical spondylosis with radiculopathy 07/23/2016   Perioral dermatitis 05/18/2016   Obesity (BMI 35.0-39.9 without comorbidity) 03/04/2016   Hypocalcemia 09/22/2015   Coronary artery disease    Headache 03/30/2015   NSTEMI (non-ST elevated myocardial infarction) (HCC) 01/25/2015   Angina pectoris (HCC) 01/24/2015   Noninfective gastroenteritis and colitis, unspecified 09/23/2014   Breast cancer of lower-outer quadrant of left female breast (HCC) 08/10/2014   Migraine 07/15/2014   Fatigue 07/15/2014   Major depressive disorder, recurrent (HCC)    HTN (hypertension)    Overactive bladder    Hypothyroidism    OSA (obstructive sleep apnea)    GERD (gastroesophageal reflux disease)    Chronic headache    Morbid obesity (HCC)     PCP: Myrna Kay, MD  REFERRING PROVIDER: Zena Art, DMD  REFERRING DIAGNOSIS: M26.629 (ICD-10-CM) - TMJ arthralgia   THERAPY DIAG: Cervicalgia  RATIONALE FOR EVALUATION AND TREATMENT: Rehabilitation  ONSET DATE: 12/2022  FOLLOW UP APPT WITH PROVIDER: Yes   FROM INITIAL EVALUATION SUBJECTIVE:                                                                                                                                                                                         Chief Complaint: L TMJ pain  Pertinent History Pt reports that she woke up one morning in December 2024 with L TMJ pain. Insidious worsening without any known trauma to head or jaw. It hurts all the way in my L ear. Initially she was having an opening restriction with popping in her L TMJ but that eventually resolved. However the soreness has persisted and she gets occasional crackling sounds with opening. She started taking cyclobenzaprine  BID which has helped slightly with her pain but when she ran out of the cyclobenzaprine  last week her pain did not worsen. She does complain of dry mouth when taking the muscle relaxers and has been  sucking on hard candy throughout the day. She also drinks a smoothie through a straw every morning. Pt  has noticed that she clenches her jaw occasionally during the day.  She has a history of cluster migraines with an increase in her headache frequency this year. She denies any worsening of her TMJ pain prior to, during, or after headaches. She also reports recent dizziness/lightheadedness which she attributes to taking the muscle relaxers. Symptoms occur with positional changes. Recently started sertraline (recently increased from 3m to 75mg ) for mood disorder. No change in TMJ symptoms since starting sertraline. She has tried meloxicam oral and diclofenac  topical for her TMJ pain but reports no improvement. She complains of chronic low back and neck pain as well as occasional bilateral hand numbness with reading. I think I probably have fibromyalgia but have never been diagnosed. PMH includes Breast CA 2016, thyroid  CA 2018, and small non-ST elevation myocardial infarctions in April 2016  and January 2017. Cardiac catheterization in April 2016 showed mild nonobstructive disease affecting the LAD with tortuous vessels. CTA in 2017 showed only 30% disease in the LAD. She reports recent cardiac/vascular imaging with cardiology and she is awaiting results.   Cardiac/Coronary CTA 06/27/23:  IMPRESSION: 1. Coronary calcium score of 242. This was 95th percentile for age and sex matched control. 2. Normal coronary origin with right dominance. 3. Mild proximal LAD stenosis (25-49%). 4. Minimal RCA stenosis (<25%). 5. CAD-RADS 2. Mild non-obstructive CAD (25-49%). Consider non-atherosclerotic causes of chest pain. Consider preventive therapy and risk factor modification.  Last Dental Examination and imaging: Last year pt reports regular imaging at the dental clinic but denies any significant findings; Recent dental procedures or orthodontics: No Pain location: In the ear L canal Pain Severity: Present:  2/10, Best: 0/10, Worst: 8/10 Pain quality: Dull, throbbing, aching pain. No sharp pain reported Radiating pain: No  Numbness/Tingling: No Aggravating factors: Pt unsure what aggravates her pain. Denies worsening pain with chewing. Occasionally slightly worse when first waking; Easing factors: No known easing factors with the exception of possible improvement in pain with muscle relaxers. No improvement with heat or additional medications.  Clicking, catching, or crepitus during chewing: Yes, denies locking but reports crackling sound when opening mouth occasionally; 24 hour pain behavior: Pain doesn't wake patient from sleep. Occasionally sore first thing in the morning but otherwise no specific changes over 24 hour period; History of grinding teeth: No Recent or remote jaw/face/neck trauma, injury, or pain: History of neck pain for at least the last 6 years. History of hip and knee arthritis.  Falls: Has patient fallen in last 6 months? No History of prior physical therapy for this issue: No  Imaging: No, pt reports that [facility] is waiting to hear from insurance to approve CT scan and oral appliance; Progression (improving, worsening, unchanged): improving History of headaches/migraines: Yes Ear symptoms (tinnitus, fullness, pain): Yes, pain is in the L ear canal. Tinnitus but pt unable to localize laterality. Denies drainage from ear, hearing loss, or aural fullness; Chest pain: Yes, mid sternal pain which feels like heartburn. 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  Hobbies: Reading, watching television, painting, swimming Red flags: Positive personal history of breast and thyroid  cancer. Pt reports intermittent nausea (gallbladder and Upper GI WNL, uses promethazine)  Negative for chills/fever, night sweats, vomiting,  unexplained weight gain/loss;  Precautions: None  Weight Bearing Restrictions: No  Patient Goals: Decrease jaw pain   OBJECTIVE  Patient Surveys  None  Mental Status Patient is oriented to person, place and time.  Recent memory is intact.  Remote memory is intact.  Attention span and concentration are intact.  Expressive speech is intact.  Patient's fund of knowledge is within normal limits for educational level.  Cranial Nerves Deferred   MUSCULOSKELETAL: Tremor: None Bulk: Normal Tone: Normal Facial Symmetry: Face appears grossly symmetrical  Posture Forward head and rounded shoulders;  Cervical Screen AROM: Limited in all directions, especially cervical extension as well as lateral flexion bilaterally. Posterior L aural pain reported with cervical extension, R lateral flexion and L rotation; Isometrics: Full strength in all directions. Pain only with resisted L rotation Passive Accessory Intervertebral Motion (PAIVM), central and unilateral (bilaterally): Deferred Spurlings A (ipsilateral lateral flexion/axial compression): R: Negative L: Negative Spurlings B (ipsilateral lateral flexion/contralateral rotation/axial compression): R: Not examined L: Not examined Cervical Distraction Test: Not examined  Cervical Fexion-Rotation Test: Not examined  Hoffman Sign (cervical cord compression): R: Negative L: Negative  Dermatome/Myotome Screen Deferred  Sensation Deferred  Reflexes Deferred  Palpation Location LEFT  RIGHT           Temporomandibular Joint (posterior, superior, anterior) 1 0  Temporalis (anterior, middle, posterior fibers) 1 0  Temporalis Tendon Insertion 0 0  Masseter (Zygoma, Body, Lateral surface of angle of mandible) 0 0  Medial ptyergoid 0 0  Frontal Sinus 0 0  Maxillary Sinus 0 0  SCM 0 0  Upper Trapezius 0 0  Subocciptials 0 0  Graded on 0-4 scale (0 = no pain, 1 = pain, 2 = pain with wincing/grimacing/flinching, 3 = pain with  withdrawal, 4 = unwilling to allow palpation)  Mandibular AROM Resting Dental Alignment/Occlusion (Overbite, underbite, overjet, crossbite): Overbite of 6mm Mandible Depression (40-44mm): 38mm Mandible Protrusion (3-18mm): 4mm Mandible Lateral Excursion (10-66mm): R: 10mm L: 11mm Audible joint sounds (crepitus or clicking with stethoscope with opening, lateral deviation, and bite): Negative Reciprocal Click (palpation, click with both opening/closing): Negative Deviation of Mouth During Opening: Positive for deviation to the L Deflection of Mouth at End Range: Return to midline at end range   Strength Deferred  Passive Accessory Joint Motion (PAIVM) Deferred  Special Tests Deferred  Beighton scale Deferred   TODAY'S TREATMENT    SUBJECTIVE: Pt reports that she is doing well today. No changes since the initial evaluation. Denies pain. No specific questions or concerns.    PAIN:   Ther-ex    Manual Therapy   palpation, cervical and TMJ PAM, dermatome/myotome testing, cranial nerve testing, mandibular strength testing, special testing   PATIENT EDUCATION:  Education details: Plan of care, TMD handout Person educated: Patient Education method: Explanation and handout Education comprehension: verbalized understanding   HOME EXERCISE PROGRAM: None currently,    ASSESSMENT:  CLINICAL IMPRESSION: Patient is a 63 y.o. female who was seen today for physical therapy evaluation and treatment for L TMJ pain.   OBJECTIVE IMPAIRMENTS: pain.   ACTIVITY LIMITATIONS: chewing  PARTICIPATION LIMITATIONS: eating out  PERSONAL FACTORS: Age, Behavior pattern, Past/current experiences, Time since onset of injury/illness/exacerbation, and 3+ comorbidities: hypothyroid, migraines, OSA, chronic neck/low back pain are also affecting patient's functional outcome.   REHAB POTENTIAL: Good  CLINICAL DECISION MAKING: Stable/uncomplicated  EVALUATION COMPLEXITY:  Low   GOALS: Goals reviewed with patient? No  SHORT TERM GOALS: Target date: 07/30/2023  Pt will be independent with HEP to decrease L TMJ and neck pain to improve pain-free function at home and while eating Baseline:  Goal status: INITIAL   LONG TERM GOALS:  Target date: 08/27/2023  Pt will decrease worst L TMJ pain to below 4/10 on the NPRS in order to demonstrate clinically significant reduction in TMJ pain. Baseline: worst: 8/10 Goal status: INITIAL  2.  Pt will decrease NDI score by at least 19% in order demonstrate clinically significant reduction in neck pain/disability. Baseline: To be completed Goal status: INITIAL  3.  Pt will report at least 70% improvement in L TMJ symptoms in order to improve pain-free chewing, talking, and AM pain       Baseline:  Goal status: INITIAL   PLAN: PT FREQUENCY: 1x/week  PT DURATION: 8 weeks  PLANNED INTERVENTIONS: Therapeutic exercises, Therapeutic activity, Neuromuscular re-education, Balance training, Gait training, Patient/Family education, Joint manipulation, Joint mobilization, Vestibular training, Canalith repositioning, Dry Needling, Electrical stimulation, Spinal manipulation, Spinal mobilization, Cryotherapy, Moist heat, Taping, Traction, Ultrasound, Ionotophoresis 4mg /ml Dexamethasone , and Manual therapy  PLAN FOR NEXT SESSION: palpation, cervical and TMJ PAM, dermatome/myotome testing, cranial nerve testing, mandibular strength testing, special testing  Selinda BIRCH Aksel Bencomo PT, DPT, GCS  Mathea Frieling 07/08/2023, 9:07 AM

## 2023-07-09 ENCOUNTER — Ambulatory Visit

## 2023-07-09 DIAGNOSIS — M542 Cervicalgia: Secondary | ICD-10-CM

## 2023-07-17 ENCOUNTER — Encounter

## 2023-07-20 NOTE — Therapy (Signed)
 OUTPATIENT PHYSICAL THERAPY TMJ TREATMENT   Patient Name: Robin Howard MRN: [id number] DOB:09/06/1960, 62 y.o., female Today's Date: 07/23/2023   PT End of Session - 07/23/23 1012     Visit Number 2    Number of Visits 9    Date for PT Re-Evaluation 08/27/23    Authorization Type eval: 07/02/23    PT Start Time 1015    PT Stop Time 1100    PT Time Calculation (min) 45 min    Activity Tolerance Patient tolerated treatment well    Behavior During Therapy [facility] for tasks assessed/performed         Past Medical History:  Diagnosis Date   Anemia    h/o   Breast cancer (HCC) 07/2014   T1c,N0, ER/ PR negative, Her 2 neu positive. Wide excision, SLN, Mammosite, adjuvant chemotherapy.    Bronchitis 04-2014   Coronary artery disease, non-occlusive    a. LHC 4/16 w/ mild nonobstructive disease affecting the LAD with tortuous vessels. CTA in 2017 showed only 30% disease in the LAD. b. Lexiscan  MV 6/18: neg for signficant ischemia, EF 54%, low risk scan   Detached retina 1997   Family history of adverse reaction to anesthesia    pts dad has pseudocholinestrace deficieny   Gastritis    GERD (gastroesophageal reflux disease)    History of nuclear stress test    a. MV 6/18: no significant ischemia, normal wall motion, EF 54%. Low risk scan   HTN (hypertension)    Hx of papillary thyroid  carcinoma 02/05/2017   Dec 2018; T3aN0Mx  s/p total thyroidectomy Dec 19, 2016, s/p I-131 RAI 01/02/17   Hypothyroidism    Insomnia    Last menstrual period (LMP) > 10 days ago    LMP 2014   Major depressive disorder, recurrent (HCC)    Migraines    Morbid obesity (HCC)    Orthostatic hypotension    a. TTE 1/19:  EF 55-60%, normal wall motion, grade 2 diastolic dysfunction, mild aortic regurgitation, mildly calcified mitral annulus, trivial pericardial effusion was identified   OSA (obstructive sleep apnea)    untreated due to lack of insurance   Overactive bladder    Pericarditis 1997   Personal  history of chemotherapy    Personal history of radiation therapy    Mammosite   Thyroid  cancer (HCC) 12/2016   [facility]   Thyroid  nodule 10/24/2016   4.7 cm, October 2018; referred for biopsy   Past Surgical History:  Procedure Laterality Date   BACK SURGERY     L4-5   BREAST BIOPSY Left 07/2014   [facility]   BREAST CYST ASPIRATION Left 12/13/2014   3:00 position 2 cmfn, fat necrosis per Dr. Fredirick notes   BREAST LUMPECTOMY Left 2016   Invasive mammary and DCIS neg margins   BREAST LUMPECTOMY WITH SENTINEL LYMPH NODE BIOPSY Left 08/13/2014   Procedure: BREAST LUMPECTOMY WITH SENTINEL LYMPH NODE BIOPSY;  Surgeon: Reyes LELON Cota, MD;  Location: [facility] OR[facility];  Service: General;  Laterality: Left;   BREAST MAMMOSITE Left 08/30/2014   Procedure: MAMMOSITE BREAST;  Surgeon: Reyes LELON Cota, MD;  Location: [facility];  Service: General;  Laterality: Left;   CESAREAN SECTION     COLONOSCOPY WITH PROPOFOL  N/A 04/18/2016   Procedure: COLONOSCOPY WITH PROPOFOL ;  Surgeon: Reyes LELON Cota, MD;  Location: [facility];  Service: Endoscopy;  Laterality: N/A;   CORONARY ANGIOPLASTY  April 2016   EYE SURGERY Left 10/16/2015   retina repair   FOOT SURGERY  FORAMINOTOMY 1 LEVEL  [id number]   KNEE SURGERY     LAMINECTOMY  [id number]   PORTACATH PLACEMENT Right 08/30/2014   Procedure: INSERTION PORT-A-CATH;  Surgeon: Reyes LELON Cota, MD;  Location: [facility];  Service: General;  Laterality: Right;   POSTERIOR LAMINECTOMY / DECOMPRESSION LUMBAR SPINE  10/29/2016   L3-5   RETINAL DETACHMENT SURGERY Right 1997   RETINAL DETACHMENT SURGERY  01/01/2018   RIGHT/LEFT HEART CATH AND CORONARY ANGIOGRAPHY Bilateral 02/11/2017   Procedure: RIGHT/LEFT HEART CATH AND CORONARY ANGIOGRAPHY;  Surgeon: Darron Deatrice LABOR, MD;  Location: [facility];  Service: Cardiovascular;  Laterality: Bilateral;   TOTAL THYROIDECTOMY  12/19/2016   TOTAL THYROIDECTOMY Bilateral 12/19/2016   [facility]   Patient Active Problem List    Diagnosis Date Noted   Allergic rhinitis 03/20/2018   Cervical cancer screening 03/20/2018   Right retinal detachment 01/02/2018   Breast nodule 06/21/2017   Sleep disturbance 05/16/2017   Hx of papillary thyroid  carcinoma 02/05/2017   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   Thyroid  cancer (HCC) 01/02/2017   Lumbar radiculopathy 10/29/2016   Medication monitoring encounter 08/22/2016   Cervical spondylosis with radiculopathy 07/23/2016   Perioral dermatitis 05/18/2016   Obesity (BMI 35.0-39.9 without comorbidity) 03/04/2016   Hypocalcemia 09/22/2015   Coronary artery disease    Headache 03/30/2015   NSTEMI (non-ST elevated myocardial infarction) (HCC) 01/25/2015   Angina pectoris (HCC) 01/24/2015   Noninfective gastroenteritis and colitis, unspecified 09/23/2014   Breast cancer of lower-outer quadrant of left female breast (HCC) 08/10/2014   Migraine 07/15/2014   Fatigue 07/15/2014   Major depressive disorder, recurrent (HCC)    HTN (hypertension)    Overactive bladder    Hypothyroidism    OSA (obstructive sleep apnea)    GERD (gastroesophageal reflux disease)    Chronic headache    Morbid obesity (HCC)     PCP: Myrna Kay, MD  REFERRING PROVIDER: Zena Art, DMD  REFERRING DIAGNOSIS: M26.629 (ICD-10-CM) - TMJ arthralgia   THERAPY DIAG: Cervicalgia  RATIONALE FOR EVALUATION AND TREATMENT: Rehabilitation  ONSET DATE: 12/2022  FOLLOW UP APPT WITH PROVIDER: Yes   FROM INITIAL EVALUATION SUBJECTIVE:                                                                                                                                                                                         Chief Complaint: L TMJ pain  Pertinent History Pt reports that she woke up one morning in December 2024 with L TMJ pain. Insidious worsening without any known trauma to head or jaw. It hurts all the way in my L ear. Initially she was having an opening restriction with  popping in her L TMJ  but that eventually resolved. However the soreness has persisted and she gets occasional crackling sounds with opening. She started taking cyclobenzaprine  BID which has helped slightly with her pain but when she ran out of the cyclobenzaprine  last week her pain did not worsen. She does complain of dry mouth when taking the muscle relaxers and has been sucking on hard candy throughout the day. She also drinks a smoothie through a straw every morning. Pt has noticed that she clenches her jaw occasionally during the day.  She has a history of cluster migraines with an increase in her headache frequency this year. She denies any worsening of her TMJ pain prior to, during, or after headaches. She also reports recent dizziness/lightheadedness which she attributes to taking the muscle relaxers. Symptoms occur with positional changes. Recently started sertraline (recently increased from 74m to 75mg ) for mood disorder. No change in TMJ symptoms since starting sertraline. She has tried meloxicam oral and diclofenac  topical for her TMJ pain but reports no improvement. She complains of chronic low back and neck pain as well as occasional bilateral hand numbness with reading. I think I probably have fibromyalgia but have never been diagnosed. PMH includes Breast CA 2016, thyroid  CA 2018, and small non-ST elevation myocardial infarctions in April 2016  and January 2017. Cardiac catheterization in April 2016 showed mild nonobstructive disease affecting the LAD with tortuous vessels. CTA in 2017 showed only 30% disease in the LAD. She reports recent cardiac/vascular imaging with cardiology and she is awaiting results.   Cardiac/Coronary CTA 06/27/23:  IMPRESSION: 1. Coronary calcium score of 242. This was 95th percentile for age and sex matched control. 2. Normal coronary origin with right dominance. 3. Mild proximal LAD stenosis (25-49%). 4. Minimal RCA stenosis (<25%). 5. CAD-RADS 2. Mild non-obstructive CAD  (25-49%). Consider non-atherosclerotic causes of chest pain. Consider preventive therapy and risk factor modification.  Last Dental Examination and imaging: Last year pt reports regular imaging at the dental clinic but denies any significant findings; Recent dental procedures or orthodontics: No Pain location: In the ear L canal Pain Severity: Present: 2/10, Best: 0/10, Worst: 8/10 Pain quality: Dull, throbbing, aching pain. No sharp pain reported Radiating pain: No  Numbness/Tingling: No Aggravating factors: Pt unsure what aggravates her pain. Denies worsening pain with chewing. Occasionally slightly worse when first waking; Easing factors: No known easing factors with the exception of possible improvement in pain with muscle relaxers. No improvement with heat or additional medications.  Clicking, catching, or crepitus during chewing: Yes, denies locking but reports crackling sound when opening mouth occasionally; 24 hour pain behavior: Pain doesn't wake patient from sleep. Occasionally sore first thing in the morning but otherwise no specific changes over 24 hour period; History of grinding teeth: No Recent or remote jaw/face/neck trauma, injury, or pain: History of neck pain for at least the last 6 years. History of hip and knee arthritis.  Falls: Has patient fallen in last 6 months? No History of prior physical therapy for this issue: No  Imaging: No, pt reports that [facility] is waiting to hear from insurance to approve CT scan and oral appliance; Progression (improving, worsening, unchanged): improving History of headaches/migraines: Yes Ear symptoms (tinnitus, fullness, pain): Yes, pain is in the L ear canal. Tinnitus but pt unable to localize laterality. Denies drainage from ear, hearing loss, or aural fullness; Chest pain: Yes, mid sternal pain which feels like heartburn. 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  Hobbies: Reading, watching television, painting, swimming Red flags: Positive personal history of breast and thyroid  cancer. Pt reports intermittent nausea (gallbladder and Upper GI WNL, uses promethazine)  Negative for chills/fever, night sweats, vomiting, unexplained weight gain/loss;  Precautions: None  Weight Bearing Restrictions: No  Patient Goals: Decrease jaw pain   OBJECTIVE  Patient Surveys  None  Mental Status Patient is oriented to person, place and time.  Recent memory is intact.  Remote memory is intact.  Attention span and concentration are intact.  Expressive speech is intact.  Patient's fund of knowledge is within normal limits for educational level.  Cranial Nerves Deferred   MUSCULOSKELETAL: Tremor: None Bulk: Normal Tone: Normal Facial Symmetry: Face appears grossly symmetrical  Posture Forward head and rounded shoulders;  Cervical Screen AROM: Limited in all directions, especially cervical extension as well as lateral flexion bilaterally. Posterior L aural pain reported with cervical extension, R lateral flexion and L rotation; Isometrics: Full strength in all directions. Pain only with resisted L rotation Passive Accessory Intervertebral Motion (PAIVM), central and unilateral (bilaterally): Deferred Spurlings A (ipsilateral lateral flexion/axial compression): R: Negative L: Negative Spurlings B (ipsilateral lateral flexion/contralateral rotation/axial compression): R: Not examined L: Not examined Cervical Distraction Test: Not examined  Cervical Fexion-Rotation Test: Not examined  Hoffman Sign (cervical cord compression): R: Negative L: Negative  Dermatome/Myotome Screen Deferred  Sensation Deferred  Reflexes Deferred  Palpation Location LEFT  RIGHT           Temporomandibular Joint (posterior, superior, anterior) 1 0   Temporalis (anterior, middle, posterior fibers) 1 0  Temporalis Tendon Insertion 0 0  Masseter (Zygoma, Body, Lateral surface of angle of mandible) 0 0  Medial ptyergoid 0 0  Frontal Sinus 0 0  Maxillary Sinus 0 0  SCM 0 0  Upper Trapezius 0 0  Subocciptials 0 0  Graded on 0-4 scale (0 = no pain, 1 = pain, 2 = pain with wincing/grimacing/flinching, 3 = pain with withdrawal, 4 = unwilling to allow palpation)  Mandibular AROM Resting Dental Alignment/Occlusion (Overbite, underbite, overjet, crossbite): Overbite of 6mm Mandible Depression (40-53mm): 38mm Mandible Protrusion (3-31mm): 4mm Mandible Lateral Excursion (10-51mm): R: 10mm L: 11mm Audible joint sounds (crepitus or clicking with stethoscope with opening, lateral deviation, and bite): Negative Reciprocal Click (palpation, click with both opening/closing): Negative Deviation of Mouth During Opening: Positive for deviation to the L Deflection of Mouth at End Range: Return to midline at end range   Strength Deferred  Passive Accessory Joint Motion (PAIVM) Deferred  Special Tests Deferred  Beighton scale Deferred   TODAY'S TREATMENT    SUBJECTIVE: Pt reports that she is doing well today. No changes since the initial evaluation but she does report some periods of relief. Denies pain. No specific questions or concerns.    PAIN: 4/10 L TMJ pain;   Manual Therapy    Palpation Location LEFT  RIGHT           Temporomandibular Joint (posterior, superior, anterior) 0 1  Temporalis (anterior, middle, posterior fibers) 0 0  Temporalis Tendon Insertion 1 0  Masseter (Zygoma, Body, Lateral surface of angle of mandible) 1 1  Medial ptyergoid 0 0  Frontal Sinus 1 1  Maxillary Sinus 0 1  SCM 1 0  Upper Trapezius 1 1  Subocciptials 1 1  Graded on 0-4 scale (0 = no pain, 1 = pain, 2 =  pain with wincing/grimacing/flinching, 3 = pain with withdrawal, 4 = unwilling to allow palpation)  Passive Accessory Motion  (LEFT) Inferior (Caudal Glide): WNL Anterior Glide: WNL but painful Medial Glide: Limited and painful  Lateral Glide: Limited and painful Pt denies reproduction of TMJ pain with CPA C2-T7 and UPA bilaterally C1-T7.  Dermatome/Myotome Screen N=normal  Ab=abnormal Level Dermatome R L Myotome R L Reflex R L  C2 Posterior Scalp N N Cervical Flexion/Extension C1-2 N N Jaw CN V    C3 Anterior Neck N N Cervical Sidebend C2-3 N N Biceps C5-6    C4 Top of Shoulder N N Shoulder Shrug C4 N N Brachiorad. C5-6    C5 Lateral Upper Arm N N Shoulder ABD C4-5 N N Triceps C7    C6 Lateral Arm/ Thumb N N Arm Flex/ Wrist Ext C5-6 N N     C7 Middle Finger N N Arm Ext//Wrist Flex C6-7 N N     C8 4th & 5th Finger N N Flex/ Ext Carpi Ulnaris C8 N N     T1 Medial Arm N N Interossei T1 N N      Sensation Grossly intact to light touch bilateral face and BUE as determined by testing branches of trigeminal nerve and dermatomes C2-T2  Cranial Nerves Visual acuity and visual fields are intact  Extraocular muscles are intact  Facial sensation is intact bilaterally  Facial strength is intact bilaterally  Hearing is normal as tested by gross conversation Palate elevates midline, normal phonation  Shoulder shrug strength is intact  Tongue protrudes midline   Strength R/L Functional Mandible Depression (Jaw Opening): WNL Functional Mandible Protrusion: WNL Functional Mandible Elevation (Jaw Closing): WNL Functional/Functional Mandible Lateral Deviation: WNL  Special Tests Biting on one separator (ipsilateral muscle activation/contralateral joint compression): R: Negative L: Negative Biting on two separators (bilateral muscle activation): R: Negative L: Negative Manual Joint Compression: Negative Manual Joint Distraction: Positive for pain with L distraction  Extra and intraoral STM to L masseter; L TMJ inferior and anterior mobilizations, grade I-II, 20s/bout x 3 bouts each;   Ther-ex  Supine cervical  retractions 6s hold/6s rest x 6; Supine isometric mandibular lateral deviation 6s hold/6s rest x 6 bilaterally; Seated scapular retractions x 6; HEP issued and reviewed;   PATIENT EDUCATION:  Education details: Plan of care, TMD handout Person educated: Patient Education method: Explanation and handout Education comprehension: verbalized understanding   HOME EXERCISE PROGRAM: Access Code: [email or web address] URL: [email or web address] Date: 07/23/2023 Prepared by: Selinda Eck  Exercises - Correct Seated Posture  - 7 x weekly - Seated Scapular Retraction  - 6 x daily - 7 x weekly - 6 reps - 6 seconds hold - Seated Cervical Retraction  - 6 x daily - 7 x weekly - 6 reps - 6 seconds hold - Isometric Jaw Deviation  - 6 x daily - 7 x weekly - 6 reps - 6 seconds hold - Isometric Jaw Deviation (Mirrored)  - 6 x daily - 7 x weekly - 6 reps - 6 seconds hold   ASSESSMENT:  CLINICAL IMPRESSION: Performed additional examination items with patient during session today. Significant tenderness to palpation and trigger points in L masseter. Hypomobile and painful L TMJ passive accessory mobility testing. Initiated manual therapy and strengthening exercises during session today with patient. Issued HEP and reviewed with patient. Pt encouraged to follow-up as scheduled. Pt will benefit from PT services to address deficits in pain, strength, and motor control in order to decrease L TMJ pain.  OBJECTIVE IMPAIRMENTS: pain.   ACTIVITY LIMITATIONS: chewing  PARTICIPATION LIMITATIONS: eating out  PERSONAL FACTORS: Age, Behavior pattern, Past/current experiences, Time since onset of injury/illness/exacerbation, and 3+ comorbidities: hypothyroid, migraines, OSA, chronic neck/low back pain are also affecting patient's functional outcome.   REHAB POTENTIAL: Good  CLINICAL DECISION MAKING: Stable/uncomplicated  EVALUATION COMPLEXITY: Low   GOALS: Goals reviewed with patient? No  SHORT TERM  GOALS: Target date: 07/30/2023  Pt will be independent with HEP to decrease L TMJ and neck pain to improve pain-free function at home and while eating Baseline:  Goal status: INITIAL   LONG TERM GOALS: Target date: 08/27/2023  Pt will decrease worst L TMJ pain to below 4/10 on the NPRS in order to demonstrate clinically significant reduction in TMJ pain. Baseline: worst: 8/10 Goal status: INITIAL  2.  Pt will decrease NDI score by at least 19% in order demonstrate clinically significant reduction in neck pain/disability. Baseline: To be completed Goal status: INITIAL  3.  Pt will report at least 70% improvement in L TMJ symptoms in order to improve pain-free chewing, talking, and AM pain       Baseline:  Goal status: INITIAL   PLAN: PT FREQUENCY: 1x/week  PT DURATION: 8 weeks  PLANNED INTERVENTIONS: Therapeutic exercises, Therapeutic activity, Neuromuscular re-education, Balance training, Gait training, Patient/Family education, Joint manipulation, Joint mobilization, Vestibular training, Canalith repositioning, Dry Needling, Electrical stimulation, Spinal manipulation, Spinal mobilization, Cryotherapy, Moist heat, Taping, Traction, Ultrasound, Ionotophoresis 4mg /ml Dexamethasone , and Manual therapy  PLAN FOR NEXT SESSION: complete NDI, progress manual techniques and strengthening, review/modify HEP as needed;   Takina Busser D Zeplin Aleshire PT, DPT, GCS  Kelan Pritt 07/23/2023, 3:09 PM

## 2023-07-23 ENCOUNTER — Ambulatory Visit: Attending: Dentistry

## 2023-07-23 DIAGNOSIS — M542 Cervicalgia: Secondary | ICD-10-CM | POA: Insufficient documentation

## 2023-07-25 ENCOUNTER — Encounter

## 2023-07-30 ENCOUNTER — Encounter

## 2023-08-01 ENCOUNTER — Ambulatory Visit

## 2023-08-01 DIAGNOSIS — M542 Cervicalgia: Secondary | ICD-10-CM

## 2023-08-01 NOTE — Therapy (Signed)
 OUTPATIENT PHYSICAL THERAPY TMJ TREATMENT   Patient Name: Robin Howard MRN: [id number] DOB:01-23-1960, 63 y.o., female Today's Date: 08/01/2023   PT End of Session - 08/01/23 1013     Visit Number 3    Number of Visits 9    Date for PT Re-Evaluation 08/27/23    Authorization Type eval: 07/02/23    PT Start Time 1015    PT Stop Time 1100    PT Time Calculation (min) 45 min    Activity Tolerance Patient tolerated treatment well    Behavior During Therapy [facility] for tasks assessed/performed         Past Medical History:  Diagnosis Date   Anemia    h/o   Breast cancer (HCC) 07/2014   T1c,N0, ER/ PR negative, Her 2 neu positive. Wide excision, SLN, Mammosite, adjuvant chemotherapy.    Bronchitis 04-2014   Coronary artery disease, non-occlusive    a. LHC 4/16 w/ mild nonobstructive disease affecting the LAD with tortuous vessels. CTA in 2017 showed only 30% disease in the LAD. b. Lexiscan  MV 6/18: neg for signficant ischemia, EF 54%, low risk scan   Detached retina 1997   Family history of adverse reaction to anesthesia    pts dad has pseudocholinestrace deficieny   Gastritis    GERD (gastroesophageal reflux disease)    History of nuclear stress test    a. MV 6/18: no significant ischemia, normal wall motion, EF 54%. Low risk scan   HTN (hypertension)    Hx of papillary thyroid  carcinoma 02/05/2017   Dec 2018; T3aN0Mx  s/p total thyroidectomy Dec 19, 2016, s/p I-131 RAI 01/02/17   Hypothyroidism    Insomnia    Last menstrual period (LMP) > 10 days ago    LMP 2014   Major depressive disorder, recurrent (HCC)    Migraines    Morbid obesity (HCC)    Orthostatic hypotension    a. TTE 1/19:  EF 55-60%, normal wall motion, grade 2 diastolic dysfunction, mild aortic regurgitation, mildly calcified mitral annulus, trivial pericardial effusion was identified   OSA (obstructive sleep apnea)    untreated due to lack of insurance   Overactive bladder    Pericarditis 1997   Personal  history of chemotherapy    Personal history of radiation therapy    Mammosite   Thyroid  cancer (HCC) 12/2016   [facility]   Thyroid  nodule 10/24/2016   4.7 cm, October 2018; referred for biopsy   Past Surgical History:  Procedure Laterality Date   BACK SURGERY     L4-5   BREAST BIOPSY Left 07/2014   [facility]   BREAST CYST ASPIRATION Left 12/13/2014   3:00 position 2 cmfn, fat necrosis per Dr. Fredirick notes   BREAST LUMPECTOMY Left 2016   Invasive mammary and DCIS neg margins   BREAST LUMPECTOMY WITH SENTINEL LYMPH NODE BIOPSY Left 08/13/2014   Procedure: BREAST LUMPECTOMY WITH SENTINEL LYMPH NODE BIOPSY;  Surgeon: Reyes LELON Cota, MD;  Location: [facility];  Service: General;  Laterality: Left;   BREAST MAMMOSITE Left 08/30/2014   Procedure: MAMMOSITE BREAST;  Surgeon: Reyes LELON Cota, MD;  Location: [facility];  Service: General;  Laterality: Left;   CESAREAN SECTION     COLONOSCOPY WITH PROPOFOL  N/A 04/18/2016   Procedure: COLONOSCOPY WITH PROPOFOL ;  Surgeon: Reyes LELON Cota, MD;  Location: [facility];  Service: Endoscopy;  Laterality: N/A;   CORONARY ANGIOPLASTY  April 2016   EYE SURGERY Left 10/16/2015   retina repair   FOOT SURGERY  FORAMINOTOMY 1 LEVEL  [id number]   KNEE SURGERY     LAMINECTOMY  [id number]   PORTACATH PLACEMENT Right 08/30/2014   Procedure: INSERTION PORT-A-CATH;  Surgeon: Reyes LELON Cota, MD;  Location: [facility];  Service: General;  Laterality: Right;   POSTERIOR LAMINECTOMY / DECOMPRESSION LUMBAR SPINE  10/29/2016   L3-5   RETINAL DETACHMENT SURGERY Right 1997   RETINAL DETACHMENT SURGERY  01/01/2018   RIGHT/LEFT HEART CATH AND CORONARY ANGIOGRAPHY Bilateral 02/11/2017   Procedure: RIGHT/LEFT HEART CATH AND CORONARY ANGIOGRAPHY;  Surgeon: Darron Deatrice LABOR, MD;  Location: [facility];  Service: Cardiovascular;  Laterality: Bilateral;   TOTAL THYROIDECTOMY  12/19/2016   TOTAL THYROIDECTOMY Bilateral 12/19/2016   [facility]   Patient Active Problem List    Diagnosis Date Noted   Allergic rhinitis 03/20/2018   Cervical cancer screening 03/20/2018   Right retinal detachment 01/02/2018   Breast nodule 06/21/2017   Sleep disturbance 05/16/2017   Hx of papillary thyroid  carcinoma 02/05/2017   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   Thyroid  cancer (HCC) 01/02/2017   Lumbar radiculopathy 10/29/2016   Medication monitoring encounter 08/22/2016   Cervical spondylosis with radiculopathy 07/23/2016   Perioral dermatitis 05/18/2016   Obesity (BMI 35.0-39.9 without comorbidity) 03/04/2016   Hypocalcemia 09/22/2015   Coronary artery disease    Headache 03/30/2015   NSTEMI (non-ST elevated myocardial infarction) (HCC) 01/25/2015   Angina pectoris (HCC) 01/24/2015   Noninfective gastroenteritis and colitis, unspecified 09/23/2014   Breast cancer of lower-outer quadrant of left female breast (HCC) 08/10/2014   Migraine 07/15/2014   Fatigue 07/15/2014   Major depressive disorder, recurrent (HCC)    HTN (hypertension)    Overactive bladder    Hypothyroidism    OSA (obstructive sleep apnea)    GERD (gastroesophageal reflux disease)    Chronic headache    Morbid obesity (HCC)     PCP: Myrna Kay, MD  REFERRING PROVIDER: Zena Art, DMD  REFERRING DIAGNOSIS: M26.629 (ICD-10-CM) - TMJ arthralgia   THERAPY DIAG: Cervicalgia  RATIONALE FOR EVALUATION AND TREATMENT: Rehabilitation  ONSET DATE: 12/2022  FOLLOW UP APPT WITH PROVIDER: Yes   FROM INITIAL EVALUATION SUBJECTIVE:                                                                                                                                                                                         Chief Complaint: L TMJ pain  Pertinent History Pt reports that she woke up one morning in December 2024 with L TMJ pain. Insidious worsening without any known trauma to head or jaw. It hurts all the way in my L ear. Initially she was having an opening restriction with  popping in her L TMJ  but that eventually resolved. However the soreness has persisted and she gets occasional crackling sounds with opening. She started taking cyclobenzaprine  BID which has helped slightly with her pain but when she ran out of the cyclobenzaprine  last week her pain did not worsen. She does complain of dry mouth when taking the muscle relaxers and has been sucking on hard candy throughout the day. She also drinks a smoothie through a straw every morning. Pt has noticed that she clenches her jaw occasionally during the day.  She has a history of cluster migraines with an increase in her headache frequency this year. She denies any worsening of her TMJ pain prior to, during, or after headaches. She also reports recent dizziness/lightheadedness which she attributes to taking the muscle relaxers. Symptoms occur with positional changes. Recently started sertraline (recently increased from 63m to 75mg ) for mood disorder. No change in TMJ symptoms since starting sertraline. She has tried meloxicam oral and diclofenac  topical for her TMJ pain but reports no improvement. She complains of chronic low back and neck pain as well as occasional bilateral hand numbness with reading. I think I probably have fibromyalgia but have never been diagnosed. PMH includes Breast CA 2016, thyroid  CA 2018, and small non-ST elevation myocardial infarctions in April 2016  and January 2017. Cardiac catheterization in April 2016 showed mild nonobstructive disease affecting the LAD with tortuous vessels. CTA in 2017 showed only 30% disease in the LAD. She reports recent cardiac/vascular imaging with cardiology and she is awaiting results.   Cardiac/Coronary CTA 06/27/23:  IMPRESSION: 1. Coronary calcium score of 242. This was 95th percentile for age and sex matched control. 2. Normal coronary origin with right dominance. 3. Mild proximal LAD stenosis (25-49%). 4. Minimal RCA stenosis (<25%). 5. CAD-RADS 2. Mild non-obstructive CAD  (25-49%). Consider non-atherosclerotic causes of chest pain. Consider preventive therapy and risk factor modification.  Last Dental Examination and imaging: Last year pt reports regular imaging at the dental clinic but denies any significant findings; Recent dental procedures or orthodontics: No Pain location: In the ear L canal Pain Severity: Present: 2/10, Best: 0/10, Worst: 8/10 Pain quality: Dull, throbbing, aching pain. No sharp pain reported Radiating pain: No  Numbness/Tingling: No Aggravating factors: Pt unsure what aggravates her pain. Denies worsening pain with chewing. Occasionally slightly worse when first waking; Easing factors: No known easing factors with the exception of possible improvement in pain with muscle relaxers. No improvement with heat or additional medications.  Clicking, catching, or crepitus during chewing: Yes, denies locking but reports crackling sound when opening mouth occasionally; 24 hour pain behavior: Pain doesn't wake patient from sleep. Occasionally sore first thing in the morning but otherwise no specific changes over 24 hour period; History of grinding teeth: No Recent or remote jaw/face/neck trauma, injury, or pain: History of neck pain for at least the last 6 years. History of hip and knee arthritis.  Falls: Has patient fallen in last 6 months? No History of prior physical therapy for this issue: No  Imaging: No, pt reports that [facility] is waiting to hear from insurance to approve CT scan and oral appliance; Progression (improving, worsening, unchanged): improving History of headaches/migraines: Yes Ear symptoms (tinnitus, fullness, pain): Yes, pain is in the L ear canal. Tinnitus but pt unable to localize laterality. Denies drainage from ear, hearing loss, or aural fullness; Chest pain: Yes, mid sternal pain which feels like heartburn. 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  Hobbies: Reading, watching television, painting, swimming Red flags: Positive personal history of breast and thyroid  cancer. Pt reports intermittent nausea (gallbladder and Upper GI WNL, uses promethazine)  Negative for chills/fever, night sweats, vomiting, unexplained weight gain/loss;  Precautions: None  Weight Bearing Restrictions: No  Patient Goals: Decrease jaw pain   OBJECTIVE  Patient Surveys  None  Mental Status Patient is oriented to person, place and time.  Recent memory is intact.  Remote memory is intact.  Attention span and concentration are intact.  Expressive speech is intact.  Patient's fund of knowledge is within normal limits for educational level.  Cranial Nerves Deferred   MUSCULOSKELETAL: Tremor: None Bulk: Normal Tone: Normal Facial Symmetry: Face appears grossly symmetrical  Posture Forward head and rounded shoulders;  Cervical Screen AROM: Limited in all directions, especially cervical extension as well as lateral flexion bilaterally. Posterior L aural pain reported with cervical extension, R lateral flexion and L rotation; Isometrics: Full strength in all directions. Pain only with resisted L rotation Passive Accessory Intervertebral Motion (PAIVM), central and unilateral (bilaterally): Deferred Spurlings A (ipsilateral lateral flexion/axial compression): R: Negative L: Negative Spurlings B (ipsilateral lateral flexion/contralateral rotation/axial compression): R: Not examined L: Not examined Cervical Distraction Test: Not examined  Cervical Fexion-Rotation Test: Not examined  Hoffman Sign (cervical cord compression): R: Negative L: Negative  Reflexes Deferred  Palpation Location LEFT  RIGHT           Temporomandibular Joint (posterior, superior, anterior) 1 0  Temporalis (anterior, middle, posterior fibers) 1 0  Temporalis  Tendon Insertion 0 0  Masseter (Zygoma, Body, Lateral surface of angle of mandible) 0 0  Medial ptyergoid 0 0  Frontal Sinus 0 0  Maxillary Sinus 0 0  SCM 0 0  Upper Trapezius 0 0  Subocciptials 0 0  Graded on 0-4 scale (0 = no pain, 1 = pain, 2 = pain with wincing/grimacing/flinching, 3 = pain with withdrawal, 4 = unwilling to allow palpation)  Mandibular AROM Resting Dental Alignment/Occlusion (Overbite, underbite, overjet, crossbite): Overbite of 6mm Mandible Depression (40-23mm): 38mm Mandible Protrusion (3-71mm): 4mm Mandible Lateral Excursion (10-47mm): R: 10mm L: 11mm Audible joint sounds (crepitus or clicking with stethoscope with opening, lateral deviation, and bite): Negative Reciprocal Click (palpation, click with both opening/closing): Negative Deviation of Mouth During Opening: Positive for deviation to the L Deflection of Mouth at End Range: Return to midline at end range   Palpation Location LEFT  RIGHT           Temporomandibular Joint (posterior, superior, anterior) 0 1  Temporalis (anterior, middle, posterior fibers) 0 0  Temporalis Tendon Insertion 1 0  Masseter (Zygoma, Body, Lateral surface of angle of mandible) 1 1  Medial ptyergoid 0 0  Frontal Sinus 1 1  Maxillary Sinus 0 1  SCM 1 0  Upper Trapezius 1 1  Subocciptials 1 1  Graded on 0-4 scale (0 = no pain, 1 = pain, 2 = pain with wincing/grimacing/flinching, 3 = pain with withdrawal, 4 = unwilling to allow palpation)  Passive Accessory Motion (LEFT) Inferior (Caudal Glide): WNL Anterior Glide: WNL but painful Medial Glide: Limited and painful  Lateral Glide: Limited and painful Pt denies reproduction of TMJ pain with CPA C2-T7 and UPA bilaterally C1-T7.  Dermatome/Myotome Screen N=normal  Ab=abnormal Level Dermatome R L Myotome R L Reflex R L  C2 Posterior Scalp N N Cervical  Flexion/Extension C1-2 N N Jaw CN V    C3 Anterior Neck N N Cervical Sidebend C2-3 N N Biceps C5-6    C4 Top of Shoulder N N  Shoulder Shrug C4 N N Brachiorad. C5-6    C5 Lateral Upper Arm N N Shoulder ABD C4-5 N N Triceps C7    C6 Lateral Arm/ Thumb N N Arm Flex/ Wrist Ext C5-6 N N     C7 Middle Finger N N Arm Ext//Wrist Flex C6-7 N N     C8 4th & 5th Finger N N Flex/ Ext Carpi Ulnaris C8 N N     T1 Medial Arm N N Interossei T1 N N      Sensation Grossly intact to light touch bilateral face and BUE as determined by testing branches of trigeminal nerve and dermatomes C2-T2  Cranial Nerves Visual acuity and visual fields are intact  Extraocular muscles are intact  Facial sensation is intact bilaterally  Facial strength is intact bilaterally  Hearing is normal as tested by gross conversation Palate elevates midline, normal phonation  Shoulder shrug strength is intact  Tongue protrudes midline   Strength R/L Functional Mandible Depression (Jaw Opening): WNL Functional Mandible Protrusion: WNL Functional Mandible Elevation (Jaw Closing): WNL Functional/Functional Mandible Lateral Deviation: WNL  Special Tests Biting on one separator (ipsilateral muscle activation/contralateral joint compression): R: Negative L: Negative Biting on two separators (bilateral muscle activation): R: Negative L: Negative Manual Joint Compression: Negative Manual Joint Distraction: Positive for pain with L distraction  Passive Accessory Joint Motion (PAM) Deferred   TODAY'S TREATMENT    SUBJECTIVE: Pt reports that she is having a lot of soreness in her jaw today. She is also having a lot of neck pain upon arrival. She has been performing her HEP and believes that the exercises might be contributing to her neck soreness. No specific questions currently.   PAIN: 3/10 L TMJ pain;   Manual Therapy   Supine upper trap, lateral flexion, and levator scapulae stretches x 30s each bilaterally; Supine suboccipital release x 60s; Attempted cervical manual traction but due to increase in pain discontinued; Extra and intraoral STM  to L masseter; L TMJ inferior and anterior mobilizations, grade I-II, 20s/bout x 3 bouts each;   Ther-ex  Supine cervical retractions 5s hold x 10 Supine isometric mandibular lateral deviation 6s hold/6s rest x 6 bilaterally; Supine isometric mandibular depression and closing 6s hold/6s rest x 6 each; Seated scapular retractions x 6; Seated cervical retractions to observe form, pt able to complete with good form; HEP updated and reviewed;   PATIENT EDUCATION:  Education details: Pt educated throughout session about proper posture and technique with exercises. Improved exercise technique, movement at target joints, use of target muscles after min to mod verbal, visual, tactile cues. Updated HEP Person educated: Patient Education method: Explanation and handout Education comprehension: verbalized understanding   HOME EXERCISE PROGRAM: Access Code: [email or web address] URL: [email or web address] Date: 08/01/2023 Prepared by: Selinda Eck  Exercises - Correct Seated Posture  - 7 x weekly - Seated Scapular Retraction  - 6 x daily - 7 x weekly - 6 reps - 6 seconds hold - Seated Cervical Retraction  - 6 x daily - 7 x weekly - 6 reps - 6 seconds hold - Isometric Jaw Deviation  - 6 x daily - 7 x weekly - 6 reps - 6 seconds hold - Isometric Jaw Deviation (Mirrored)  - 6 x daily - 7 x weekly - 6 reps - 6 seconds hold -  Isometric Jaw Adduction  - 6 x daily - 7 x weekly - 6 reps - 6 seconds hold   ASSESSMENT:  CLINICAL IMPRESSION: Performed additional manual therapy and strengthening during session today. She remains tender over her L masseter but also along her cervical paraspinals/suboccipitals. Updated HEP and reviewed with patient. Pt encouraged to follow-up as scheduled. Pt will benefit from PT services to address deficits in pain, strength, and motor control in order to decrease L TMJ pain.  OBJECTIVE IMPAIRMENTS: pain.   ACTIVITY LIMITATIONS: chewing  PARTICIPATION  LIMITATIONS: eating out  PERSONAL FACTORS: Age, Behavior pattern, Past/current experiences, Time since onset of injury/illness/exacerbation, and 3+ comorbidities: hypothyroid, migraines, OSA, chronic neck/low back pain are also affecting patient's functional outcome.   REHAB POTENTIAL: Good  CLINICAL DECISION MAKING: Stable/uncomplicated  EVALUATION COMPLEXITY: Low   GOALS: Goals reviewed with patient? No  SHORT TERM GOALS: Target date: 07/30/2023  Pt will be independent with HEP to decrease L TMJ and neck pain to improve pain-free function at home and while eating Baseline:  Goal status: INITIAL   LONG TERM GOALS: Target date: 08/27/2023  Pt will decrease worst L TMJ pain to below 4/10 on the NPRS in order to demonstrate clinically significant reduction in TMJ pain. Baseline: worst: 8/10 Goal status: INITIAL  2.  Pt will decrease NDI score by at least 19% in order demonstrate clinically significant reduction in neck pain/disability. Baseline: 08/01/23: 46% Goal status: INITIAL  3.  Pt will report at least 70% improvement in L TMJ symptoms in order to improve pain-free chewing, talking, and AM pain       Baseline:  Goal status: INITIAL   PLAN: PT FREQUENCY: 1x/week  PT DURATION: 8 weeks  PLANNED INTERVENTIONS: Therapeutic exercises, Therapeutic activity, Neuromuscular re-education, Balance training, Gait training, Patient/Family education, Joint manipulation, Joint mobilization, Vestibular training, Canalith repositioning, Dry Needling, Electrical stimulation, Spinal manipulation, Spinal mobilization, Cryotherapy, Moist heat, Taping, Traction, Ultrasound, Ionotophoresis 4mg /ml Dexamethasone , and Manual therapy  PLAN FOR NEXT SESSION: progress manual techniques and strengthening, review/modify HEP as needed;   Praneeth Bussey D Woodrow Dulski PT, DPT, GCS  Nyimah Shadduck 08/01/2023, 11:33 AM

## 2023-08-02 ENCOUNTER — Encounter: Payer: Self-pay | Admitting: Advanced Practice Midwife

## 2023-08-06 ENCOUNTER — Ambulatory Visit

## 2023-08-08 ENCOUNTER — Ambulatory Visit

## 2023-08-12 NOTE — Therapy (Signed)
 OUTPATIENT PHYSICAL THERAPY TMJ TREATMENT   Patient Name: Robin Howard MRN: [id number] DOB:Jun 19, 1960, 63 y.o., female Today's Date: 08/15/2023   PT End of Session - 08/15/23 1015     Visit Number 4    Number of Visits 9    Date for PT Re-Evaluation 08/27/23    Authorization Type eval: 07/02/23    PT Start Time 1015    PT Stop Time 1100    PT Time Calculation (min) 45 min    Activity Tolerance Patient tolerated treatment well    Behavior During Therapy [facility] for tasks assessed/performed         Past Medical History:  Diagnosis Date   Anemia    h/o   Breast cancer (HCC) 07/2014   T1c,N0, ER/ PR negative, Her 2 neu positive. Wide excision, SLN, Mammosite, adjuvant chemotherapy.    Bronchitis 04-2014   Coronary artery disease, non-occlusive    a. LHC 4/16 w/ mild nonobstructive disease affecting the LAD with tortuous vessels. CTA in 2017 showed only 30% disease in the LAD. b. Lexiscan  MV 6/18: neg for signficant ischemia, EF 54%, low risk scan   Detached retina 1997   Family history of adverse reaction to anesthesia    pts dad has pseudocholinestrace deficieny   Gastritis    GERD (gastroesophageal reflux disease)    History of nuclear stress test    a. MV 6/18: no significant ischemia, normal wall motion, EF 54%. Low risk scan   HTN (hypertension)    Hx of papillary thyroid  carcinoma 02/05/2017   Dec 2018; T3aN0Mx  s/p total thyroidectomy Dec 19, 2016, s/p I-131 RAI 01/02/17   Hypothyroidism    Insomnia    Last menstrual period (LMP) > 10 days ago    LMP 2014   Major depressive disorder, recurrent (HCC)    Migraines    Morbid obesity (HCC)    Orthostatic hypotension    a. TTE 1/19:  EF 55-60%, normal wall motion, grade 2 diastolic dysfunction, mild aortic regurgitation, mildly calcified mitral annulus, trivial pericardial effusion was identified   OSA (obstructive sleep apnea)    untreated due to lack of insurance   Overactive bladder    Pericarditis 1997   Personal  history of chemotherapy    Personal history of radiation therapy    Mammosite   Thyroid  cancer (HCC) 12/2016   [facility]   Thyroid  nodule 10/24/2016   4.7 cm, October 2018; referred for biopsy   Past Surgical History:  Procedure Laterality Date   BACK SURGERY     L4-5   BREAST BIOPSY Left 07/2014   [facility]   BREAST CYST ASPIRATION Left 12/13/2014   3:00 position 2 cmfn, fat necrosis per Dr. Fredirick notes   BREAST LUMPECTOMY Left 2016   Invasive mammary and DCIS neg margins   BREAST LUMPECTOMY WITH SENTINEL LYMPH NODE BIOPSY Left 08/13/2014   Procedure: BREAST LUMPECTOMY WITH SENTINEL LYMPH NODE BIOPSY;  Surgeon: Reyes LELON Cota, MD;  Location: [facility] ORS;  Service: General;  Laterality: Left;   BREAST MAMMOSITE Left 08/30/2014   Procedure: MAMMOSITE BREAST;  Surgeon: Reyes LELON Cota, MD;  Location: [facility];  Service: General;  Laterality: Left;   CESAREAN SECTION     COLONOSCOPY WITH PROPOFOL  N/A 04/18/2016   Procedure: COLONOSCOPY WITH PROPOFOL ;  Surgeon: Reyes LELON Cota, MD;  Location: [facility];  Service: Endoscopy;  Laterality: N/A;   CORONARY ANGIOPLASTY  April 2016   EYE SURGERY Left 10/16/2015   retina repair   FOOT SURGERY  FORAMINOTOMY 1 LEVEL  [id number]   KNEE SURGERY     LAMINECTOMY  [id number]   PORTACATH PLACEMENT Right 08/30/2014   Procedure: INSERTION PORT-A-CATH;  Surgeon: Reyes LELON Cota, MD;  Location: [facility] OR[facility];  Service: General;  Laterality: Right;   POSTERIOR LAMINECTOMY / DECOMPRESSION LUMBAR SPINE  10/29/2016   L3-5   RETINAL DETACHMENT SURGERY Right 1997   RETINAL DETACHMENT SURGERY  01/01/2018   RIGHT/LEFT HEART CATH AND CORONARY ANGIOGRAPHY Bilateral 02/11/2017   Procedure: RIGHT/LEFT HEART CATH AND CORONARY ANGIOGRAPHY;  Surgeon: Darron Deatrice LABOR, MD;  Location: [facility];  Service: Cardiovascular;  Laterality: Bilateral;   TOTAL THYROIDECTOMY  12/19/2016   TOTAL THYROIDECTOMY Bilateral 12/19/2016   [facility]   Patient Active Problem List    Diagnosis Date Noted   Allergic rhinitis 03/20/2018   Cervical cancer screening 03/20/2018   Right retinal detachment 01/02/2018   Breast nodule 06/21/2017   Sleep disturbance 05/16/2017   Hx of papillary thyroid  carcinoma 02/05/2017   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   Thyroid  cancer (HCC) 01/02/2017   Lumbar radiculopathy 10/29/2016   Medication monitoring encounter 08/22/2016   Cervical spondylosis with radiculopathy 07/23/2016   Perioral dermatitis 05/18/2016   Obesity (BMI 35.0-39.9 without comorbidity) 03/04/2016   Hypocalcemia 09/22/2015   Coronary artery disease    Headache 03/30/2015   NSTEMI (non-ST elevated myocardial infarction) (HCC) 01/25/2015   Angina pectoris (HCC) 01/24/2015   Noninfective gastroenteritis and colitis, unspecified 09/23/2014   Breast cancer of lower-outer quadrant of left female breast (HCC) 08/10/2014   Migraine 07/15/2014   Fatigue 07/15/2014   Major depressive disorder, recurrent (HCC)    HTN (hypertension)    Overactive bladder    Hypothyroidism    OSA (obstructive sleep apnea)    GERD (gastroesophageal reflux disease)    Chronic headache    Morbid obesity (HCC)     PCP: Myrna Kay, MD  REFERRING PROVIDER: Zena Art, DMD  REFERRING DIAGNOSIS: M26.629 (ICD-10-CM) - TMJ arthralgia   THERAPY DIAG: Cervicalgia  RATIONALE FOR EVALUATION AND TREATMENT: Rehabilitation  ONSET DATE: 12/2022  FOLLOW UP APPT WITH PROVIDER: Yes   FROM INITIAL EVALUATION SUBJECTIVE:                                                                                                                                                                                         Chief Complaint: L TMJ pain  Pertinent History Pt reports that she woke up one morning in December 2024 with L TMJ pain. Insidious worsening without any known trauma to head or jaw. It hurts all the way in my L ear. Initially she was having an opening restriction with  popping in her L TMJ  but that eventually resolved. However the soreness has persisted and she gets occasional crackling sounds with opening. She started taking cyclobenzaprine  BID which has helped slightly with her pain but when she ran out of the cyclobenzaprine  last week her pain did not worsen. She does complain of dry mouth when taking the muscle relaxers and has been sucking on hard candy throughout the day. She also drinks a smoothie through a straw every morning. Pt has noticed that she clenches her jaw occasionally during the day.  She has a history of cluster migraines with an increase in her headache frequency this year. She denies any worsening of her TMJ pain prior to, during, or after headaches. She also reports recent dizziness/lightheadedness which she attributes to taking the muscle relaxers. Symptoms occur with positional changes. Recently started sertraline (recently increased from 45m to 75mg ) for mood disorder. No change in TMJ symptoms since starting sertraline. She has tried meloxicam oral and diclofenac  topical for her TMJ pain but reports no improvement. She complains of chronic low back and neck pain as well as occasional bilateral hand numbness with reading. I think I probably have fibromyalgia but have never been diagnosed. PMH includes Breast CA 2016, thyroid  CA 2018, and small non-ST elevation myocardial infarctions in April 2016  and January 2017. Cardiac catheterization in April 2016 showed mild nonobstructive disease affecting the LAD with tortuous vessels. CTA in 2017 showed only 30% disease in the LAD. She reports recent cardiac/vascular imaging with cardiology and she is awaiting results.   Cardiac/Coronary CTA 06/27/23:  IMPRESSION: 1. Coronary calcium score of 242. This was 95th percentile for age and sex matched control. 2. Normal coronary origin with right dominance. 3. Mild proximal LAD stenosis (25-49%). 4. Minimal RCA stenosis (<25%). 5. CAD-RADS 2. Mild non-obstructive CAD  (25-49%). Consider non-atherosclerotic causes of chest pain. Consider preventive therapy and risk factor modification.  [facility] Beam CT Both Jaws 08/13/23 Right TMJ: degenerative joint disease with possible internal derangement  Left TMJ: degenerative joint disease with possible internal derangement  Mandibular asymmetry  Cervical spine degenerative changes  Partial ossification of stylohyoid ligaments  Left calcified carotid atheroma  Reduced airway space  Incisal wear  Periodontal bone loss  Retained root fragment  Abfraction vs caries, recurrent caries, defective restoration  Extruded root fill (tooth #27)  Persistent inflammation vs apical scar, vertical bone defect (tooth #31)  Suggested frontal osteoma (R)   Last Dental Examination and imaging: Last year pt reports regular imaging at the dental clinic but denies any significant findings; Recent dental procedures or orthodontics: No Pain location: In the ear L canal Pain Severity: Present: 2/10, Best: 0/10, Worst: 8/10 Pain quality: Dull, throbbing, aching pain. No sharp pain reported Radiating pain: No  Numbness/Tingling: No Aggravating factors: Pt unsure what aggravates her pain. Denies worsening pain with chewing. Occasionally slightly worse when first waking; Easing factors: No known easing factors with the exception of possible improvement in pain with muscle relaxers. No improvement with heat or additional medications.  Clicking, catching, or crepitus during chewing: Yes, denies locking but reports crackling sound when opening mouth occasionally; 24 hour pain behavior: Pain doesn't wake patient from sleep. Occasionally sore first thing in the morning but otherwise no specific changes over 24 hour period; History of grinding teeth: No Recent or remote jaw/face/neck trauma, injury, or pain: History of neck pain for at least the last 6 years. History of hip and knee arthritis.  Falls: Has patient fallen in last  6 months?  No History of prior physical therapy for this issue: No  Imaging: No, pt reports that [facility] is waiting to hear from insurance to approve CT scan and oral appliance; Progression (improving, worsening, unchanged): improving History of headaches/migraines: Yes Ear symptoms (tinnitus, fullness, pain): Yes, pain is in the L ear canal. Tinnitus but pt unable to localize laterality. Denies drainage from ear, hearing loss, or aural fullness; Chest pain: Yes, mid sternal pain which feels like heartburn. 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  Hobbies: Reading, watching television, painting, swimming Red flags: Positive personal history of breast and thyroid  cancer. Pt reports intermittent nausea (gallbladder and Upper GI WNL, uses promethazine)  Negative for chills/fever, night sweats, vomiting, unexplained weight gain/loss;  Precautions: None  Weight Bearing Restrictions: No  Patient Goals: Decrease jaw pain   OBJECTIVE  Patient Surveys  None  Mental Status Patient is oriented to person, place and time.  Recent memory is intact.  Remote memory is intact.  Attention span and concentration are intact.  Expressive speech is intact.  Patient's fund of knowledge is within normal limits for educational level.  Cranial Nerves Deferred   MUSCULOSKELETAL: Tremor: None Bulk: Normal Tone: Normal Facial Symmetry: Face appears grossly symmetrical  Posture Forward head and rounded shoulders;  Cervical Screen AROM: Limited in all directions, especially cervical extension as well as lateral flexion bilaterally. Posterior L aural pain reported with cervical extension, R lateral flexion and L rotation; Isometrics: Full strength in all directions. Pain only with resisted L rotation Passive Accessory Intervertebral Motion (PAIVM),  central and unilateral (bilaterally): Deferred Spurlings A (ipsilateral lateral flexion/axial compression): R: Negative L: Negative Spurlings B (ipsilateral lateral flexion/contralateral rotation/axial compression): R: Not examined L: Not examined Cervical Distraction Test: Not examined  Cervical Fexion-Rotation Test: Not examined  Hoffman Sign (cervical cord compression): R: Negative L: Negative  Reflexes Deferred  Palpation Location LEFT  RIGHT           Temporomandibular Joint (posterior, superior, anterior) 1 0  Temporalis (anterior, middle, posterior fibers) 1 0  Temporalis Tendon Insertion 0 0  Masseter (Zygoma, Body, Lateral surface of angle of mandible) 0 0  Medial ptyergoid 0 0  Frontal Sinus 0 0  Maxillary Sinus 0 0  SCM 0 0  Upper Trapezius 0 0  Subocciptials 0 0  Graded on 0-4 scale (0 = no pain, 1 = pain, 2 = pain with wincing/grimacing/flinching, 3 = pain with withdrawal, 4 = unwilling to allow palpation)  Mandibular AROM Resting Dental Alignment/Occlusion (Overbite, underbite, overjet, crossbite): Overbite of 6mm Mandible Depression (40-40mm): 38mm Mandible Protrusion (3-27mm): 4mm Mandible Lateral Excursion (10-61mm): R: 10mm L: 11mm Audible joint sounds (crepitus or clicking with stethoscope with opening, lateral deviation, and bite): Negative Reciprocal Click (palpation, click with both opening/closing): Negative Deviation of Mouth During Opening: Positive for deviation to the L Deflection of Mouth at End Range: Return to midline at end range   Palpation Location LEFT  RIGHT           Temporomandibular Joint (posterior, superior, anterior) 0 1  Temporalis (anterior, middle, posterior fibers) 0 0  Temporalis Tendon Insertion 1 0  Masseter (Zygoma, Body, Lateral surface of angle of mandible) 1 1  Medial ptyergoid 0 0  Frontal Sinus 1 1  Maxillary Sinus 0 1  SCM 1 0  Upper Trapezius 1 1  Subocciptials  1 1  Graded on 0-4 scale (0 = no pain, 1 = pain, 2 =  pain with wincing/grimacing/flinching, 3 = pain with withdrawal, 4 = unwilling to allow palpation)  Passive Accessory Motion (LEFT) Inferior (Caudal Glide): WNL Anterior Glide: WNL but painful Medial Glide: Limited and painful  Lateral Glide: Limited and painful Pt denies reproduction of TMJ pain with CPA C2-T7 and UPA bilaterally C1-T7.  Dermatome/Myotome Screen N=normal  Ab=abnormal Level Dermatome R L Myotome R L Reflex R L  C2 Posterior Scalp N N Cervical Flexion/Extension C1-2 N N Jaw CN V    C3 Anterior Neck N N Cervical Sidebend C2-3 N N Biceps C5-6    C4 Top of Shoulder N N Shoulder Shrug C4 N N Brachiorad. C5-6    C5 Lateral Upper Arm N N Shoulder ABD C4-5 N N Triceps C7    C6 Lateral Arm/ Thumb N N Arm Flex/ Wrist Ext C5-6 N N     C7 Middle Finger N N Arm Ext//Wrist Flex C6-7 N N     C8 4th & 5th Finger N N Flex/ Ext Carpi Ulnaris C8 N N     T1 Medial Arm N N Interossei T1 N N      Sensation Grossly intact to light touch bilateral face and BUE as determined by testing branches of trigeminal nerve and dermatomes C2-T2  Cranial Nerves Visual acuity and visual fields are intact  Extraocular muscles are intact  Facial sensation is intact bilaterally  Facial strength is intact bilaterally  Hearing is normal as tested by gross conversation Palate elevates midline, normal phonation  Shoulder shrug strength is intact  Tongue protrudes midline   Strength R/L Functional Mandible Depression (Jaw Opening): WNL Functional Mandible Protrusion: WNL Functional Mandible Elevation (Jaw Closing): WNL Functional/Functional Mandible Lateral Deviation: WNL  Special Tests Biting on one separator (ipsilateral muscle activation/contralateral joint compression): R: Negative L: Negative Biting on two separators (bilateral muscle activation): R: Negative L: Negative Manual Joint Compression: Negative Manual Joint Distraction: Positive for pain with L distraction  Passive Accessory Joint  Motion (PAM) Deferred   TODAY'S TREATMENT    SUBJECTIVE: Pt reports that she is having a lot of neck and jaw pain upon arrival today. She has been very active over the last couple days which has exacerbated her pain. She had a return visit with the [facility] dental clinic and her muscle relaxer was changed. She also had a CT of her jaw which showed DJD with possible internal derangement bilaterally as well as mandibular asymmetry and c-spine degenerative changes. She was sick last week with a GI bug and was unable to come to therapy. She has not performed her HEP for the past 3 days. No specific questions currently.   PAIN: 4/10 L TMJ pain, 6/10 cervical spine pain;   Manual Therapy   Extra and intraoral STM to L masseter; STM to L SCM and L suboccipitals; L TMJ inferior and anterior mobilizations, grade I-II, 20s/bout x 3 bouts each; Attempted trigger point release to medial pterygoid however pt unable to tolerate;   Ther-ex  Supine cervical retractions 5s hold x 10 Supine isometric mandibular lateral deviation 6s hold/6s rest x 6 bilaterally; Supine isometric mandibular depression and closing 6s hold/6s rest x 6 each; Seated tongue clicks x 6; HEP updated and reviewed;   PATIENT EDUCATION:  Education details: Pt educated throughout session about proper posture and technique with exercises. Improved exercise technique, movement at target joints, use of target muscles after min to  mod verbal, visual, tactile cues. Updated HEP Person educated: Patient Education method: Explanation and handout Education comprehension: verbalized understanding   HOME EXERCISE PROGRAM: Access Code: [email or web address] URL: [email or web address] Date: 08/15/2023 Prepared by: Selinda Eck  Exercises - Correct Seated Posture  - 7 x weekly - Seated Scapular Retraction  - 6 x daily - 7 x weekly - 6 reps - 6 seconds hold - Seated Cervical Retraction  - 6 x daily - 7 x weekly - 6 reps - 6 seconds  hold - Isometric Jaw Deviation  - 6 x daily - 7 x weekly - 6 reps - 6 seconds hold - Isometric Jaw Deviation (Mirrored)  - 6 x daily - 7 x weekly - 6 reps - 6 seconds hold - Isometric Jaw Adduction  - 6 x daily - 7 x weekly - 6 reps - 6 seconds hold - Tongue Clicks TMJ  - 6 x daily - 7 x weekly - 6 reps   ASSESSMENT:  CLINICAL IMPRESSION: Progressed manual therapy and strengthening during session today. Poor tolerance for additional stretches and strengthening due to increase in neck pain upon arrival. She remains tender over her L masseter and is unable to tolerate palpation of her L medial ptyergoid. Updated HEP and reviewed with patient. Pt encouraged to follow-up as scheduled. Pt will benefit from PT services to address deficits in pain, strength, and motor control in order to decrease L TMJ pain.  OBJECTIVE IMPAIRMENTS: pain.   ACTIVITY LIMITATIONS: chewing  PARTICIPATION LIMITATIONS: eating out  PERSONAL FACTORS: Age, Behavior pattern, Past/current experiences, Time since onset of injury/illness/exacerbation, and 3+ comorbidities: hypothyroid, migraines, OSA, chronic neck/low back pain are also affecting patient's functional outcome.   REHAB POTENTIAL: Good  CLINICAL DECISION MAKING: Stable/uncomplicated  EVALUATION COMPLEXITY: Low   GOALS: Goals reviewed with patient? No  SHORT TERM GOALS: Target date: 07/30/2023  Pt will be independent with HEP to decrease L TMJ and neck pain to improve pain-free function at home and while eating Baseline:  Goal status: INITIAL   LONG TERM GOALS: Target date: 08/27/2023  Pt will decrease worst L TMJ pain to below 4/10 on the NPRS in order to demonstrate clinically significant reduction in TMJ pain. Baseline: worst: 8/10 Goal status: INITIAL  2.  Pt will decrease NDI score by at least 19% in order demonstrate clinically significant reduction in neck pain/disability. Baseline: 08/01/23: 46% Goal status: INITIAL  3.  Pt will report at  least 70% improvement in L TMJ symptoms in order to improve pain-free chewing, talking, and AM pain       Baseline:  Goal status: INITIAL   PLAN: PT FREQUENCY: 1x/week  PT DURATION: 8 weeks  PLANNED INTERVENTIONS: Therapeutic exercises, Therapeutic activity, Neuromuscular re-education, Balance training, Gait training, Patient/Family education, Joint manipulation, Joint mobilization, Vestibular training, Canalith repositioning, Dry Needling, Electrical stimulation, Spinal manipulation, Spinal mobilization, Cryotherapy, Moist heat, Taping, Traction, Ultrasound, Ionotophoresis 4mg /ml Dexamethasone , and Manual therapy  PLAN FOR NEXT SESSION: progress manual techniques and strengthening, review/modify HEP as needed;   Nylani Michetti D Nolene Rocks PT, DPT, GCS  Avana Kreiser 08/15/2023, 11:07 AM

## 2023-08-13 ENCOUNTER — Encounter

## 2023-08-15 ENCOUNTER — Ambulatory Visit

## 2023-08-15 DIAGNOSIS — M542 Cervicalgia: Secondary | ICD-10-CM | POA: Diagnosis not present

## 2023-08-17 NOTE — Therapy (Incomplete)
 OUTPATIENT PHYSICAL THERAPY TMJ TREATMENT   Patient Name: Robin Howard MRN: [id number] DOB:[birth year], 63 y.o., female Today's Date: 08/17/2023    Past Medical History:  Diagnosis Date   Anemia    h/o   Breast cancer (HCC) 07/2014   T1c,N0, ER/ PR negative, Her 2 neu positive. Wide excision, SLN, Mammosite, adjuvant chemotherapy.    Bronchitis 04-2014   Coronary artery disease, non-occlusive    a. LHC 4/16 w/ mild nonobstructive disease affecting the LAD with tortuous vessels. CTA in 2017 showed only 30% disease in the LAD. b. Lexiscan  MV 6/18: neg for signficant ischemia, EF 54%, low risk scan   Detached retina 1997   Family history of adverse reaction to anesthesia    pts dad has pseudocholinestrace deficieny   Gastritis    GERD (gastroesophageal reflux disease)    History of nuclear stress test    a. MV 6/18: no significant ischemia, normal wall motion, EF 54%. Low risk scan   HTN (hypertension)    Hx of papillary thyroid  carcinoma 02/05/2017   Dec 2018; T3aN0Mx  s/p total thyroidectomy Dec 19, 2016, s/p I-131 RAI 01/02/17   Hypothyroidism    Insomnia    Last menstrual period (LMP) > 10 days ago    LMP 2014   Major depressive disorder, recurrent (HCC)    Migraines    Morbid obesity (HCC)    Orthostatic hypotension    a. TTE 1/19:  EF 55-60%, normal wall motion, grade 2 diastolic dysfunction, mild aortic regurgitation, mildly calcified mitral annulus, trivial pericardial effusion was identified   OSA (obstructive sleep apnea)    untreated due to lack of insurance   Overactive bladder    Pericarditis 1997   Personal history of chemotherapy    Personal history of radiation therapy    Mammosite   Thyroid  cancer (HCC) 12/2016   [facility]   Thyroid  nodule 10/24/2016   4.7 cm, October 2018; referred for biopsy   Past Surgical History:  Procedure Laterality Date   BACK SURGERY     L4-5   BREAST BIOPSY Left 07/2014   [facility]   BREAST CYST ASPIRATION Left 12/13/2014   3:00 position  2 cmfn, fat necrosis per Dr. Fredirick notes   BREAST LUMPECTOMY Left 2016   Invasive mammary and DCIS neg margins   BREAST LUMPECTOMY WITH SENTINEL LYMPH NODE BIOPSY Left 08/13/2014   Procedure: BREAST LUMPECTOMY WITH SENTINEL LYMPH NODE BIOPSY;  Surgeon: Reyes LELON Cota, MD;  Location: [facility];  Service: General;  Laterality: Left;   BREAST MAMMOSITE Left 08/30/2014   Procedure: MAMMOSITE BREAST;  Surgeon: Reyes LELON Cota, MD;  Location: [facility];  Service: General;  Laterality: Left;   CESAREAN SECTION     COLONOSCOPY WITH PROPOFOL  N/A 04/18/2016   Procedure: COLONOSCOPY WITH PROPOFOL ;  Surgeon: Reyes LELON Cota, MD;  Location: [facility];  Service: Endoscopy;  Laterality: N/A;   CORONARY ANGIOPLASTY  April 2016   EYE SURGERY Left 10/16/2015   retina repair   FOOT SURGERY     FORAMINOTOMY 1 LEVEL  [id number]   KNEE SURGERY     LAMINECTOMY  [id number]   PORTACATH PLACEMENT Right 08/30/2014   Procedure: INSERTION PORT-A-CATH;  Surgeon: Reyes LELON Cota, MD;  Location: [facility];  Service: General;  Laterality: Right;   POSTERIOR LAMINECTOMY / DECOMPRESSION LUMBAR SPINE  10/29/2016   L3-5   RETINAL DETACHMENT SURGERY Right 1997   RETINAL DETACHMENT SURGERY  01/01/2018   RIGHT/LEFT HEART CATH AND CORONARY ANGIOGRAPHY Bilateral 02/11/2017  Procedure: RIGHT/LEFT HEART CATH AND CORONARY ANGIOGRAPHY;  Surgeon: Darron Deatrice LABOR, MD;  Location: [facility];  Service: Cardiovascular;  Laterality: Bilateral;   TOTAL THYROIDECTOMY  12/19/2016   TOTAL THYROIDECTOMY Bilateral 12/19/2016   [facility]   Patient Active Problem List   Diagnosis Date Noted   Allergic rhinitis 03/20/2018   Cervical cancer screening 03/20/2018   Right retinal detachment 01/02/2018   Breast nodule 06/21/2017   Sleep disturbance 05/16/2017   Hx of papillary thyroid  carcinoma 02/05/2017   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   Thyroid  cancer (HCC) 01/02/2017   Lumbar radiculopathy 10/29/2016   Medication  monitoring encounter 08/22/2016   Cervical spondylosis with radiculopathy 07/23/2016   Perioral dermatitis 05/18/2016   Obesity (BMI 35.0-39.9 without comorbidity) 03/04/2016   Hypocalcemia 09/22/2015   Coronary artery disease    Headache 03/30/2015   NSTEMI (non-ST elevated myocardial infarction) (HCC) 01/25/2015   Angina pectoris (HCC) 01/24/2015   Noninfective gastroenteritis and colitis, unspecified 09/23/2014   Breast cancer of lower-outer quadrant of left female breast (HCC) 08/10/2014   Migraine 07/15/2014   Fatigue 07/15/2014   Major depressive disorder, recurrent (HCC)    HTN (hypertension)    Overactive bladder    Hypothyroidism    OSA (obstructive sleep apnea)    GERD (gastroesophageal reflux disease)    Chronic headache    Morbid obesity (HCC)     PCP: Myrna Kay, MD  REFERRING PROVIDER: Zena Art, DMD  REFERRING DIAGNOSIS: M26.629 (ICD-10-CM) - TMJ arthralgia   THERAPY DIAG: Cervicalgia  RATIONALE FOR EVALUATION AND TREATMENT: Rehabilitation  ONSET DATE: 12/2022  FOLLOW UP APPT WITH PROVIDER: Yes   FROM INITIAL EVALUATION SUBJECTIVE:                                                                                                                                                                                         Chief Complaint: L TMJ pain  Pertinent History Pt reports that she woke up one morning in December 2024 with L TMJ pain. Insidious worsening without any known trauma to head or jaw. It hurts all the way in my L ear. Initially she was having an opening restriction with popping in her L TMJ but that eventually resolved. However the soreness has persisted and she gets occasional crackling sounds with opening. She started taking cyclobenzaprine  BID which has helped slightly with her pain but when she ran out of the cyclobenzaprine  last week her pain did not worsen. She does complain of dry mouth when taking the muscle relaxers and has been sucking on  hard candy throughout the day. She also drinks a smoothie through a straw every morning. Pt  has noticed that she clenches her jaw occasionally during the day.  She has a history of cluster migraines with an increase in her headache frequency this year. She denies any worsening of her TMJ pain prior to, during, or after headaches. She also reports recent dizziness/lightheadedness which she attributes to taking the muscle relaxers. Symptoms occur with positional changes. Recently started sertraline (recently increased from 38m to 75mg ) for mood disorder. No change in TMJ symptoms since starting sertraline. She has tried meloxicam oral and diclofenac  topical for her TMJ pain but reports no improvement. She complains of chronic low back and neck pain as well as occasional bilateral hand numbness with reading. I think I probably have fibromyalgia but have never been diagnosed. PMH includes Breast CA 2016, thyroid  CA 2018, and small non-ST elevation myocardial infarctions in April 2016  and January 2017. Cardiac catheterization in April 2016 showed mild nonobstructive disease affecting the LAD with tortuous vessels. CTA in 2017 showed only 30% disease in the LAD. She reports recent cardiac/vascular imaging with cardiology and she is awaiting results.   Cardiac/Coronary CTA 06/27/23:  IMPRESSION: 1. Coronary calcium score of 242. This was 95th percentile for age and sex matched control. 2. Normal coronary origin with right dominance. 3. Mild proximal LAD stenosis (25-49%). 4. Minimal RCA stenosis (<25%). 5. CAD-RADS 2. Mild non-obstructive CAD (25-49%). Consider non-atherosclerotic causes of chest pain. Consider preventive therapy and risk factor modification.  [facility] Beam CT Both Jaws 08/13/23 Right TMJ: degenerative joint disease with possible internal derangement  Left TMJ: degenerative joint disease with possible internal derangement  Mandibular asymmetry  Cervical spine degenerative changes  Partial  ossification of stylohyoid ligaments  Left calcified carotid atheroma  Reduced airway space  Incisal wear  Periodontal bone loss  Retained root fragment  Abfraction vs caries, recurrent caries, defective restoration  Extruded root fill (tooth #27)  Persistent inflammation vs apical scar, vertical bone defect (tooth #31)  Suggested frontal osteoma (R)   Last Dental Examination and imaging: Last year pt reports regular imaging at the dental clinic but denies any significant findings; Recent dental procedures or orthodontics: No Pain location: In the ear L canal Pain Severity: Present: 2/10, Best: 0/10, Worst: 8/10 Pain quality: Dull, throbbing, aching pain. No sharp pain reported Radiating pain: No  Numbness/Tingling: No Aggravating factors: Pt unsure what aggravates her pain. Denies worsening pain with chewing. Occasionally slightly worse when first waking; Easing factors: No known easing factors with the exception of possible improvement in pain with muscle relaxers. No improvement with heat or additional medications.  Clicking, catching, or crepitus during chewing: Yes, denies locking but reports crackling sound when opening mouth occasionally; 24 hour pain behavior: Pain doesn't wake patient from sleep. Occasionally sore first thing in the morning but otherwise no specific changes over 24 hour period; History of grinding teeth: No Recent or remote jaw/face/neck trauma, injury, or pain: History of neck pain for at least the last 6 years. History of hip and knee arthritis.  Falls: Has patient fallen in last 6 months? No History of prior physical therapy for this issue: No  Imaging: No, pt reports that [facility] is waiting to hear from insurance to approve CT scan and oral appliance; Progression (improving, worsening, unchanged): improving History of headaches/migraines: Yes Ear symptoms (tinnitus, fullness, pain): Yes, pain is in the L ear canal. Tinnitus but pt unable to localize  laterality. Denies drainage from ear, hearing loss, or aural fullness; Chest pain: Yes, mid sternal pain which feels like heartburn. 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  Hobbies: Reading, watching television, painting, swimming Red flags: Positive personal history of breast and thyroid  cancer. Pt reports intermittent nausea (gallbladder and Upper GI WNL, uses promethazine)  Negative for chills/fever, night sweats, vomiting, unexplained weight gain/loss;  Precautions: None  Weight Bearing Restrictions: No  Patient Goals: Decrease jaw pain   OBJECTIVE  Patient Surveys  None  Mental Status Patient is oriented to person, place and time.  Recent memory is intact.  Remote memory is intact.  Attention span and concentration are intact.  Expressive speech is intact.  Patient's fund of knowledge is within normal limits for educational level.  Cranial Nerves Deferred   MUSCULOSKELETAL: Tremor: None Bulk: Normal Tone: Normal Facial Symmetry: Face appears grossly symmetrical  Posture Forward head and rounded shoulders;  Cervical Screen AROM: Limited in all directions, especially cervical extension as well as lateral flexion bilaterally. Posterior L aural pain reported with cervical extension, R lateral flexion and L rotation; Isometrics: Full strength in all directions. Pain only with resisted L rotation Passive Accessory Intervertebral Motion (PAIVM), central and unilateral (bilaterally): Deferred Spurlings A (ipsilateral lateral flexion/axial compression): R: Negative L: Negative Spurlings B (ipsilateral lateral flexion/contralateral rotation/axial compression): R: Not examined L: Not examined Cervical Distraction Test: Not examined  Cervical Fexion-Rotation Test: Not examined  Hoffman Sign (cervical cord  compression): R: Negative L: Negative  Reflexes Deferred  Palpation Location LEFT  RIGHT           Temporomandibular Joint (posterior, superior, anterior) 1 0  Temporalis (anterior, middle, posterior fibers) 1 0  Temporalis Tendon Insertion 0 0  Masseter (Zygoma, Body, Lateral surface of angle of mandible) 0 0  Medial ptyergoid 0 0  Frontal Sinus 0 0  Maxillary Sinus 0 0  SCM 0 0  Upper Trapezius 0 0  Subocciptials 0 0  Graded on 0-4 scale (0 = no pain, 1 = pain, 2 = pain with wincing/grimacing/flinching, 3 = pain with withdrawal, 4 = unwilling to allow palpation)  Mandibular AROM Resting Dental Alignment/Occlusion (Overbite, underbite, overjet, crossbite): Overbite of 6mm Mandible Depression (40-68mm): 38mm Mandible Protrusion (3-33mm): 4mm Mandible Lateral Excursion (10-44mm): R: 10mm L: 11mm Audible joint sounds (crepitus or clicking with stethoscope with opening, lateral deviation, and bite): Negative Reciprocal Click (palpation, click with both opening/closing): Negative Deviation of Mouth During Opening: Positive for deviation to the L Deflection of Mouth at End Range: Return to midline at end range   Palpation Location LEFT  RIGHT           Temporomandibular Joint (posterior, superior, anterior) 0 1  Temporalis (anterior, middle, posterior fibers) 0 0  Temporalis Tendon Insertion 1 0  Masseter (Zygoma, Body, Lateral surface of angle of mandible) 1 1  Medial ptyergoid 0 0  Frontal Sinus 1 1  Maxillary Sinus 0 1  SCM 1 0  Upper Trapezius 1 1  Subocciptials 1 1  Graded on 0-4 scale (0 = no pain, 1 = pain, 2 = pain with wincing/grimacing/flinching, 3 = pain with withdrawal, 4 = unwilling to allow palpation)  Passive Accessory Motion (LEFT) Inferior (Caudal Glide): WNL Anterior Glide: WNL but painful Medial Glide: Limited and painful  Lateral Glide: Limited and painful Pt denies reproduction of TMJ pain with CPA C2-T7 and UPA bilaterally C1-T7.  Dermatome/Myotome  Screen N=normal  Ab=abnormal Level Dermatome R L Myotome R L Reflex  R L  C2 Posterior Scalp N N Cervical Flexion/Extension C1-2 N N Jaw CN V    C3 Anterior Neck N N Cervical Sidebend C2-3 N N Biceps C5-6    C4 Top of Shoulder N N Shoulder Shrug C4 N N Brachiorad. C5-6    C5 Lateral Upper Arm N N Shoulder ABD C4-5 N N Triceps C7    C6 Lateral Arm/ Thumb N N Arm Flex/ Wrist Ext C5-6 N N     C7 Middle Finger N N Arm Ext//Wrist Flex C6-7 N N     C8 4th & 5th Finger N N Flex/ Ext Carpi Ulnaris C8 N N     T1 Medial Arm N N Interossei T1 N N      Sensation Grossly intact to light touch bilateral face and BUE as determined by testing branches of trigeminal nerve and dermatomes C2-T2  Cranial Nerves Visual acuity and visual fields are intact  Extraocular muscles are intact  Facial sensation is intact bilaterally  Facial strength is intact bilaterally  Hearing is normal as tested by gross conversation Palate elevates midline, normal phonation  Shoulder shrug strength is intact  Tongue protrudes midline   Strength R/L Functional Mandible Depression (Jaw Opening): WNL Functional Mandible Protrusion: WNL Functional Mandible Elevation (Jaw Closing): WNL Functional/Functional Mandible Lateral Deviation: WNL  Special Tests Biting on one separator (ipsilateral muscle activation/contralateral joint compression): R: Negative L: Negative Biting on two separators (bilateral muscle activation): R: Negative L: Negative Manual Joint Compression: Negative Manual Joint Distraction: Positive for pain with L distraction  Passive Accessory Joint Motion (PAM) Deferred   TODAY'S TREATMENT    SUBJECTIVE: Pt reports that she is having a lot of neck and jaw pain upon arrival today. She has been very active over the last couple days which has exacerbated her pain. She had a return visit with the [facility] dental clinic and her muscle relaxer was changed. She also had a CT of her jaw which showed DJD with  possible internal derangement bilaterally as well as mandibular asymmetry and c-spine degenerative changes. She was sick last week with a GI bug and was unable to come to therapy. She has not performed her HEP for the past 3 days. No specific questions currently.   PAIN: 4/10 L TMJ pain, 6/10 cervical spine pain;   Manual Therapy   Extra and intraoral STM to L masseter; STM to L SCM and L suboccipitals; L TMJ inferior and anterior mobilizations, grade I-II, 20s/bout x 3 bouts each; Attempted trigger point release to medial pterygoid however pt unable to tolerate;   Ther-ex  Supine cervical retractions 5s hold x 10 Supine isometric mandibular lateral deviation 6s hold/6s rest x 6 bilaterally; Supine isometric mandibular depression and closing 6s hold/6s rest x 6 each; Seated tongue clicks x 6; HEP updated and reviewed;   PATIENT EDUCATION:  Education details: Pt educated throughout session about proper posture and technique with exercises. Improved exercise technique, movement at target joints, use of target muscles after min to mod verbal, visual, tactile cues. Updated HEP Person educated: Patient Education method: Explanation and handout Education comprehension: verbalized understanding   HOME EXERCISE PROGRAM: Access Code: [email or web address] URL: [email or web address] Date: 08/15/2023 Prepared by: Selinda Eck  Exercises - Correct Seated Posture  - 7 x weekly - Seated Scapular Retraction  - 6 x daily - 7 x weekly - 6 reps - 6 seconds hold - Seated Cervical Retraction  - 6 x daily - 7 x weekly - 6 reps -  6 seconds hold - Isometric Jaw Deviation  - 6 x daily - 7 x weekly - 6 reps - 6 seconds hold - Isometric Jaw Deviation (Mirrored)  - 6 x daily - 7 x weekly - 6 reps - 6 seconds hold - Isometric Jaw Adduction  - 6 x daily - 7 x weekly - 6 reps - 6 seconds hold - Tongue Clicks TMJ  - 6 x daily - 7 x weekly - 6 reps   ASSESSMENT:  CLINICAL IMPRESSION: Progressed  manual therapy and strengthening during session today. Poor tolerance for additional stretches and strengthening due to increase in neck pain upon arrival. She remains tender over her L masseter and is unable to tolerate palpation of her L medial ptyergoid. Updated HEP and reviewed with patient. Pt encouraged to follow-up as scheduled. Pt will benefit from PT services to address deficits in pain, strength, and motor control in order to decrease L TMJ pain.  OBJECTIVE IMPAIRMENTS: pain.   ACTIVITY LIMITATIONS: chewing  PARTICIPATION LIMITATIONS: eating out  PERSONAL FACTORS: Age, Behavior pattern, Past/current experiences, Time since onset of injury/illness/exacerbation, and 3+ comorbidities: hypothyroid, migraines, OSA, chronic neck/low back pain are also affecting patient's functional outcome.   REHAB POTENTIAL: Good  CLINICAL DECISION MAKING: Stable/uncomplicated  EVALUATION COMPLEXITY: Low   GOALS: Goals reviewed with patient? No  SHORT TERM GOALS: Target date: 07/30/2023  Pt will be independent with HEP to decrease L TMJ and neck pain to improve pain-free function at home and while eating Baseline:  Goal status: INITIAL   LONG TERM GOALS: Target date: 08/27/2023  Pt will decrease worst L TMJ pain to below 4/10 on the NPRS in order to demonstrate clinically significant reduction in TMJ pain. Baseline: worst: 8/10 Goal status: INITIAL  2.  Pt will decrease NDI score by at least 19% in order demonstrate clinically significant reduction in neck pain/disability. Baseline: 08/01/23: 46% Goal status: INITIAL  3.  Pt will report at least 70% improvement in L TMJ symptoms in order to improve pain-free chewing, talking, and AM pain       Baseline:  Goal status: INITIAL   PLAN: PT FREQUENCY: 1x/week  PT DURATION: 8 weeks  PLANNED INTERVENTIONS: Therapeutic exercises, Therapeutic activity, Neuromuscular re-education, Balance training, Gait training, Patient/Family education, Joint  manipulation, Joint mobilization, Vestibular training, Canalith repositioning, Dry Needling, Electrical stimulation, Spinal manipulation, Spinal mobilization, Cryotherapy, Moist heat, Taping, Traction, Ultrasound, Ionotophoresis 4mg /ml Dexamethasone , and Manual therapy  PLAN FOR NEXT SESSION: progress manual techniques and strengthening, review/modify HEP as needed;   Andrey Hoobler D Lacrystal Barbe PT, DPT, GCS  Milbern Doescher 08/17/2023, 3:00 PM

## 2023-08-20 ENCOUNTER — Encounter

## 2023-08-22 ENCOUNTER — Ambulatory Visit

## 2023-08-22 DIAGNOSIS — M542 Cervicalgia: Secondary | ICD-10-CM

## 2023-09-02 NOTE — Progress Notes (Unsigned)
 Cardiology Office Note    Date:  09/05/2023   ID:  Robin Howard, DOB 01/27/1960, MRN [id number]  PCP:  Myrna Kay, MD  Cardiologist:  Deatrice Cage, MD  Electrophysiologist:  None   Chief Complaint: Follow up  History of Present Illness:   Robin Howard is a 63 y.o. female with history of mild nonobstructive CAD, HTN, HLD, chronic back pain, breast cancer diagnosed in 2017 status post lumpectomy and chemotherapy, thyroid  cancer status post thyroidectomy with subsequent hypothyroidism on replacement therapy, obesity, and sleep apnea who presents for follow-up of CAD.   She was admitted to the hospital in 04/2014 with chest pain and mildly elevated troponin with LHC demonstrating mild nonobstructive LAD disease.  EF was normal.  In 2019, she had worsening dyspnea and underwent an [facility] which showed minor luminal irregularities with no evidence of obstructive CAD, normal LV systolic function, and normal right heart catheterization.    She was last seen in the office on 06/03/2023 and reported epigastric/chest burning sensation that was not improved with the addition of pantoprazole .  Discomfort was not exacerbated by exertion and would randomly occur.  In this setting she underwent echo in 05/2023 that showed an EF of 50 to 55%, mild LVH, grade 1 diastolic dysfunction, mildly reduced RV systolic function with normal ventricular cavity size, trivial mitral regurgitation, and borderline dilatation of the aortic root measuring 39 mm and ascending aorta measuring 36 mm.  Coronary CTA on 06/27/2023 showed a calcium score of 242 which was the 95th percentile.  There was 25 to 49% proximal LAD stenosis and less than 25% RCA stenosis.  She comes in and continues to note exertional shortness of breath.  She is without symptoms of frank chest pain or cardiac decompensation.  Dizziness has improved following discontinuation of Flexeril .  She does note some mild lower extremity swelling if she has been up on her  feet for prolonged time frames.  She is limited in exercise secondary to hip and knee pain.  No symptoms concerning for claudication.  No near syncope or frank syncope.   Labs independently reviewed: 07/2023 - TSH normal 05/2023 - TC 116, TG 19, HDL 53, LDL 42, Hgb 12.3, PLT 256, BUN 10, serum creatinine 1.04, potassium 4.7, albumin 4.1, AST/ALT normal 06/2022 - A1c 5.3  Past Medical History:  Diagnosis Date   Anemia    h/o   Breast cancer (HCC) 07/2014   T1c,N0, ER/ PR negative, Her 2 neu positive. Wide excision, SLN, Mammosite, adjuvant chemotherapy.    Bronchitis 04-2014   Coronary artery disease, non-occlusive    a. LHC 4/16 w/ mild nonobstructive disease affecting the LAD with tortuous vessels. CTA in 2017 showed only 30% disease in the LAD. b. Lexiscan  MV 6/18: neg for signficant ischemia, EF 54%, low risk scan   Detached retina 1997   Family history of adverse reaction to anesthesia    pts dad has pseudocholinestrace deficieny   Gastritis    GERD (gastroesophageal reflux disease)    History of nuclear stress test    a. MV 6/18: no significant ischemia, normal wall motion, EF 54%. Low risk scan   HTN (hypertension)    Hx of papillary thyroid  carcinoma 02/05/2017   Dec 2018; T3aN0Mx  s/p total thyroidectomy Dec 19, 2016, s/p I-131 RAI 01/02/17   Hypothyroidism    Insomnia    Last menstrual period (LMP) > 10 days ago    LMP 2014   Major depressive disorder, recurrent (HCC)  Migraines    Morbid obesity (HCC)    Orthostatic hypotension    a. TTE 1/19:  EF 55-60%, normal wall motion, grade 2 diastolic dysfunction, mild aortic regurgitation, mildly calcified mitral annulus, trivial pericardial effusion was identified   OSA (obstructive sleep apnea)    untreated due to lack of insurance   Overactive bladder    Pericarditis 1997   Personal history of chemotherapy    Personal history of radiation therapy    Mammosite   Thyroid  cancer (HCC) 12/2016   [facility]   Thyroid  nodule  10/24/2016   4.7 cm, October 2018; referred for biopsy    Past Surgical History:  Procedure Laterality Date   BACK SURGERY     L4-5   BREAST BIOPSY Left 07/2014   [facility]   BREAST CYST ASPIRATION Left 12/13/2014   3:00 position 2 cmfn, fat necrosis per Dr. Fredirick notes   BREAST LUMPECTOMY Left 2016   Invasive mammary and DCIS neg margins   BREAST LUMPECTOMY WITH SENTINEL LYMPH NODE BIOPSY Left 08/13/2014   Procedure: BREAST LUMPECTOMY WITH SENTINEL LYMPH NODE BIOPSY;  Surgeon: Reyes LELON Cota, MD;  Location: [facility] ORS;  Service: General;  Laterality: Left;   BREAST MAMMOSITE Left 08/30/2014   Procedure: MAMMOSITE BREAST;  Surgeon: Reyes LELON Cota, MD;  Location: [facility] ORS;  Service: General;  Laterality: Left;   CESAREAN SECTION     COLONOSCOPY WITH PROPOFOL  N/A 04/18/2016   Procedure: COLONOSCOPY WITH PROPOFOL ;  Surgeon: Reyes LELON Cota, MD;  Location: [facility] ENDOSCOPY;  Service: Endoscopy;  Laterality: N/A;   CORONARY ANGIOPLASTY  April 2016   EYE SURGERY Left 10/16/2015   retina repair   FOOT SURGERY     FORAMINOTOMY 1 LEVEL  [id number]   KNEE SURGERY     LAMINECTOMY  [id number]   PORTACATH PLACEMENT Right 08/30/2014   Procedure: INSERTION PORT-A-CATH;  Surgeon: Reyes LELON Cota, MD;  Location: [facility] ORS;  Service: General;  Laterality: Right;   POSTERIOR LAMINECTOMY / DECOMPRESSION LUMBAR SPINE  10/29/2016   L3-5   RETINAL DETACHMENT SURGERY Right 1997   RETINAL DETACHMENT SURGERY  01/01/2018   RIGHT/LEFT HEART CATH AND CORONARY ANGIOGRAPHY Bilateral 02/11/2017   Procedure: RIGHT/LEFT HEART CATH AND CORONARY ANGIOGRAPHY;  Surgeon: Darron Deatrice LABOR, MD;  Location: [facility] INVASIVE CV LAB;  Service: Cardiovascular;  Laterality: Bilateral;   TOTAL THYROIDECTOMY  12/19/2016   TOTAL THYROIDECTOMY Bilateral 12/19/2016   [facility]    Current Medications: Current Meds  Medication Sig   acetaminophen  (TYLENOL ) 500 MG tablet Take 2 tablets (1,000 mg total) every 6 (six) hours as needed by mouth  for mild pain or headache. Maximum dose of acetaminophen  3,000 mg total per day   Ascorbic Acid (VITAMIN C) 100 MG tablet Take 100 mg by mouth daily.   aspirin  EC 81 MG tablet Take 81 mg by mouth daily.   atorvastatin (LIPITOR) 20 MG tablet Take 1 tablet by mouth daily.   buPROPion  (WELLBUTRIN  XL) 150 MG 24 hr tablet Take 450 mg by mouth daily.   cholecalciferol (VITAMIN D3) 25 MCG (1000 UT) tablet Take 1,000 Units by mouth daily.   cyclobenzaprine  (FLEXERIL ) 5 MG tablet Take 5 mg by mouth 3 (three) times daily as needed for muscle spasms.    levothyroxine  (SYNTHROID ) 112 MCG tablet Take 112 mcg by mouth daily before breakfast.    metoprolol  tartrate (LOPRESSOR ) 25 MG tablet Take 1 tablet (25 mg total) by mouth daily.   metoprolol  tartrate 75 MG TABS TAKE 1 TABLET 2 HR PRIOR  TO CARDIAC PROCEDURE   Multiple Vitamins-Iron (MULTIVITAMIN/IRON PO) Take by mouth.   pantoprazole  (PROTONIX ) 40 MG tablet Take 40 mg by mouth daily.   sertraline (ZOLOFT) 50 MG tablet Take 50 mg by mouth daily.   valsartan (DIOVAN) 80 MG tablet Take 80 mg by mouth daily.    Allergies:   Metoclopramide, Paxil [paroxetine hcl], Tape, Tegaderm ag mesh [silver], and Ultram [tramadol]   Social History   Socioeconomic History   Marital status: Divorced    Spouse name: Not on file   Number of children: Not on file   Years of education: Not on file   Highest education level: Not on file  Occupational History   Not on file  Tobacco Use   Smoking status: Never   Smokeless tobacco: Never  Vaping Use   Vaping status: Never Used  Substance and Sexual Activity   Alcohol use: No   Drug use: No   Sexual activity: Not Currently  Other Topics Concern   Not on file  Social History Narrative   Not on file   Social Drivers of Health   Financial Resource Strain: Low Risk  (07/05/2021)   Received from [facility]   Overall Financial Resource Strain (CARDIA)    Difficulty of Paying Living Expenses: Not very hard  Food  Insecurity: No Food Insecurity (07/05/2021)   Received from [facility]   Hunger Vital Sign    Within the past 12 months, you worried that your food would run out before you got the money to buy more.: Never true    Within the past 12 months, the food you bought just didn't last and you didn't have money to get more.: Never true  Transportation Needs: No Transportation Needs (07/05/2021)   Received from [facility]   PRAPARE - Transportation    Lack of Transportation (Medical): No    Lack of Transportation (Non-Medical): No  Physical Activity: Not on file  Stress: Not on file  Social Connections: Not on file     Family History:  The patient's family history includes Alcohol abuse in her mother; Arrhythmia in her father; Breast cancer (age of onset: 56) in her sister; COPD in her mother; Cancer in her brother, father, maternal uncle, and sister; Congestive Heart Failure in her father; Depression in her father; Diabetes in an other family member; Heart attack in her father; Heart disease (age of onset: 54) in her paternal grandfather; Heart failure in her father; Hypertension in her father and mother; Liver cancer in her brother; Thyroid  disease in her mother.  ROS:   12-point review of systems is negative unless otherwise noted in the HPI.   EKGs/Labs/Other Studies Reviewed:    Studies reviewed were summarized above. The additional studies were reviewed today:  Coronary CTA 06/27/2023: FINDINGS: Aorta:  Normal size.  No calcifications.  No dissection.   Aortic Valve:  Trileaflet.  No calcifications.   Coronary Arteries:  Normal coronary origin.  Right dominance.   RCA is a dominant artery. There is calcified plaque proximally causing minimal stenosis (<25%).   Left main gives rise to LAD and LCX arteries. LM has no disease.   LAD has calcified plaque proximally causing mild stenosis (25-49%).   LCX is a non-dominant artery.  There is no plaque.   Other findings:    Normal pulmonary vein drainage into the left atrium.   Normal left atrial appendage without a thrombus.   Normal size of the pulmonary artery.   IMPRESSION:  1. Coronary calcium score of 242. This was 95th percentile for age and sex matched control. 2. Normal coronary origin with right dominance. 3. Mild proximal LAD stenosis (25-49%). 4. Minimal RCA stenosis (<25%). 5. CAD-RADS 2. Mild non-obstructive CAD (25-49%). Consider non-atherosclerotic causes of chest pain. Consider preventive therapy and risk factor modification. __________  2D echo 06/14/2023: 1. Left ventricular ejection fraction, by estimation, is 50 to 55%. The  left ventricle has low normal function. Left ventricular endocardial  border not optimally defined to evaluate regional wall motion. There is  mild left ventricular hypertrophy. Left  ventricular diastolic parameters are consistent with Grade I diastolic  dysfunction (impaired relaxation).   2. Right ventricular systolic function is mildly reduced. The right  ventricular size is normal. Mildly increased right ventricular wall  thickness.   3. The mitral valve is normal in structure. Trivial mitral valve  regurgitation.   4. The aortic valve is tricuspid. Aortic valve regurgitation is not  visualized. No aortic stenosis is present.   5. Aortic dilatation noted. There is borderline dilatation of the aortic  root, measuring 39 mm. There is borderline dilatation of the ascending  aorta, measuring 36 mm.  __________  2D echo 03/22/2021: 1. Left ventricular ejection fraction, by estimation, is 55 to 60%. Left  ventricular ejection fraction by 2D MOD biplane is 57.6 %. The left  ventricle has normal function. The left ventricle has no regional wall  motion abnormalities. Left ventricular  diastolic parameters were normal.   2. Right ventricular systolic function is normal. The right ventricular  size is normal.   3. The mitral valve is normal in structure.  Trivial mitral valve  regurgitation.   4. The aortic valve is tricuspid. Aortic valve regurgitation is not  visualized.   5. Aortic dilatation noted. There is mild dilatation of the aortic root,  measuring 39 mm.   6. The inferior vena cava is dilated in size with >50% respiratory  variability, suggesting right atrial pressure of 8 mmHg.   Comparison(s): LVEF 55-60%.  __________  [facility] 02/11/2017: Prox LAD lesion is 15% stenosed. The left ventricular systolic function is normal. LV end diastolic pressure is normal. The left ventricular ejection fraction is 55-65% by visual estimate.   1.  Minor irregularities with no evidence of obstructive coronary artery disease. 2.  Normal LV systolic function. 3.  Right heart catheterization showed normal filling pressures, normal pulmonary pressure and normal cardiac output.  RA pressure was 2, RV pressure was 30 over 8 mmHg, pulmonary capillary wedge pressure was 10 mmHg, PA pressure was 27/13 with a mean of 19 mmHg.   Recommendations: Shortness of breath and chest pain do not seem to be cardiac in origin. __________   2D echo 02/05/2017: - Procedure narrative: Transthoracic echocardiography. Image    quality was suboptimal. The study was technically difficult, as a    result of poor acoustic windows and body habitus.  - Left ventricle: The cavity size was normal. Wall thickness was    increased in a pattern of mild LVH. Systolic function was normal.    The estimated ejection fraction was in the range of 55% to 65%.    The study is not technically sufficient to allow evaluation of LV    diastolic function.  - Right ventricle: The cavity size was normal. Wall thickness was    normal. Systolic function was normal.  __________   Lexiscan  MPI 06/27/2016: Pharmacological myocardial perfusion imaging study with no significant ischemia Normal wall motion, EF  estimated at 54% No EKG changes concerning for ischemia at peak stress or in  recovery. Nonspecific ST and T wave abnormality at rest Low risk scan __________   2D echo 01/25/2015: - Left ventricle: Systolic function was normal. The estimated    ejection fraction was 50%. Doppler parameters are consistent with    abnormal left ventricular relaxation (grade 1 diastolic    dysfunction).  - Aortic valve: Valve area (Vmax): 2.12 cm^2.  - Mitral valve: There was mild regurgitation.  - Right atrium: The atrium was mildly dilated.  - Atrial septum: No defect or patent foramen ovale was identified.   Impressions:   - Normal wall motion exept mild septal hypokinesis, with    borderrline LVEF and mild diastolic dysfunction.   EKG:  EKG is not ordered today.    Recent Labs: 06/03/2023: ALT 17; BUN 10; Creatinine, Ser 1.04; Hemoglobin 12.3; Platelets 256; Potassium 4.7; Sodium 140  Recent Lipid Panel    Component Value Date/Time   CHOL 116 06/03/2023 1427   CHOL 132 04/17/2014 0444   TRIG 119 06/03/2023 1427   TRIG 115 04/17/2014 0444   HDL 53 06/03/2023 1427   HDL 37 (L) 04/17/2014 0444   CHOLHDL 2.2 06/03/2023 1427   CHOLHDL 3.4 08/22/2016 1033   VLDL 37 (H) 08/22/2016 1033   VLDL 23 04/17/2014 0444   LDLCALC 42 06/03/2023 1427   LDLCALC 72 04/17/2014 0444   LDLDIRECT 49 06/03/2023 1427    PHYSICAL EXAM:    VS:  BP 130/70 (BP Location: Left Arm, Patient Position: Sitting, Cuff Size: Normal)   Pulse 94   Ht 5' 5 (1.651 m)   Wt 227 lb (103 kg)   LMP 08/26/2012 (Exact Date)   SpO2 98%   BMI 37.77 kg/m   BMI: Body mass index is 37.77 kg/m.  Physical Exam Vitals reviewed.  Constitutional:      Appearance: She is well-developed.  HENT:     Head: Normocephalic and atraumatic.  Eyes:     General:        Right eye: No discharge.        Left eye: No discharge.  Cardiovascular:     Rate and Rhythm: Normal rate and regular rhythm.     Pulses:          Dorsalis pedis pulses are 2+ on the right side and 2+ on the left side.       Posterior tibial  pulses are 2+ on the right side and 2+ on the left side.     Heart sounds: Normal heart sounds, S1 normal and S2 normal. Heart sounds not distant. No midsystolic click and no opening snap. No murmur heard.    No friction rub.  Pulmonary:     Effort: Pulmonary effort is normal. No respiratory distress.     Breath sounds: Normal breath sounds. No decreased breath sounds, wheezing, rhonchi or rales.  Musculoskeletal:     Cervical back: Normal range of motion.     Right lower leg: No edema.     Left lower leg: No edema.  Skin:    General: Skin is warm and dry.     Nails: There is no clubbing.  Neurological:     Mental Status: She is alert and oriented to person, place, and time.  Psychiatric:        Speech: Speech normal.        Behavior: Behavior normal.        Thought Content: Thought content normal.  Judgment: Judgment normal.     Wt Readings from Last 3 Encounters:  09/05/23 227 lb (103 kg)  06/03/23 230 lb 3.2 oz (104.4 kg)  12/06/21 233 lb 4 oz (105.8 kg)     ASSESSMENT & PLAN:   Nonobstructive CAD: No symptoms of frank chest pain.  Continue aggressive risk factor modification and primary prevention including aspirin  81 mg atorvastatin 20 mg.  Dyspnea: Echo from earlier this year with normal LV systolic function with grade 1 diastolic dysfunction and mildly reduced RV systolic function.  Coronary CTA without evidence of obstructive CAD.  Refer to pulmonology for PFTs +/- high-resolution chest CT for further evaluation.  Should workup be reassuring, may need to consider RHC to further understand hemodynamics.  HTN: Blood pressure is reasonably controlled in the office today.  She remains on valsartan and Lopressor .  HLD: LDL 42 in 05/2023 with normal AST/ALT at that time.  She remains on atorvastatin 20 mg.     Disposition: F/u with Dr. Darron or an APP in 6 months.   Medication Adjustments/Labs and Tests Ordered: Current medicines are reviewed at length with the  patient today.  Concerns regarding medicines are outlined above. Medication changes, Labs and Tests ordered today are summarized above and listed in the Patient Instructions accessible in Encounters.   Signed, Bernardino Bring, PA-C 09/05/2023 2:17 PM     [facility] - [place] [address] [place], [place] [address] [phone]

## 2023-09-05 ENCOUNTER — Ambulatory Visit: Attending: Physician Assistant | Admitting: Physician Assistant

## 2023-09-05 ENCOUNTER — Encounter: Payer: Self-pay | Admitting: Physician Assistant

## 2023-09-05 VITALS — BP 130/70 | HR 94 | Ht 65.0 in | Wt 227.0 lb

## 2023-09-05 DIAGNOSIS — I10 Essential (primary) hypertension: Secondary | ICD-10-CM | POA: Diagnosis not present

## 2023-09-05 DIAGNOSIS — I2510 Atherosclerotic heart disease of native coronary artery without angina pectoris: Secondary | ICD-10-CM | POA: Diagnosis not present

## 2023-09-05 DIAGNOSIS — E785 Hyperlipidemia, unspecified: Secondary | ICD-10-CM | POA: Diagnosis not present

## 2023-09-05 DIAGNOSIS — R0602 Shortness of breath: Secondary | ICD-10-CM

## 2023-09-05 NOTE — Patient Instructions (Signed)
 Medication Instructions:  Your physician recommends that you continue on your current medications as directed. Please refer to the Current Medication list given to you today.   *If you need a refill on your cardiac medications before your next appointment, please call your pharmacy*  Lab Work: None ordered at this time   Follow-Up: At [facility], you and your health needs are our priority.  As part of our continuing mission to provide you with exceptional heart care, our providers are all part of one team.  This team includes your primary Cardiologist (physician) and Advanced Practice Providers or APPs (Physician Assistants and Nurse Practitioners) who all work together to provide you with the care you need, when you need it.  Your next appointment:   6 month(s)  Provider:   You may see Deatrice Cage, MD or Bernardino Bring, PA-C

## 2023-10-07 ENCOUNTER — Ambulatory Visit: Admitting: Pulmonary Disease

## 2023-11-06 ENCOUNTER — Ambulatory Visit

## 2023-11-06 DIAGNOSIS — L814 Other melanin hyperpigmentation: Secondary | ICD-10-CM | POA: Diagnosis not present

## 2023-11-06 DIAGNOSIS — L821 Other seborrheic keratosis: Secondary | ICD-10-CM

## 2023-11-06 DIAGNOSIS — L820 Inflamed seborrheic keratosis: Secondary | ICD-10-CM | POA: Diagnosis not present

## 2023-11-06 DIAGNOSIS — L578 Other skin changes due to chronic exposure to nonionizing radiation: Secondary | ICD-10-CM

## 2023-11-06 DIAGNOSIS — L570 Actinic keratosis: Secondary | ICD-10-CM

## 2023-11-06 DIAGNOSIS — W908XXA Exposure to other nonionizing radiation, initial encounter: Secondary | ICD-10-CM

## 2023-11-06 NOTE — Progress Notes (Signed)
 Subjective   Robin Howard is a 63 y.o. female who presents for the following: Lesion(s) of concern . Patient is new patient  Today patient reports: Areas of concern on her face and chest.   Review of Systems:    No other skin or systemic complaints except as noted in HPI or Assessment and Plan.  The following portions of the chart were reviewed this encounter and updated as appropriate: medications, allergies, medical history  Relevant Medical History:  Breast and thyroid  cancer.   Objective  Well appearing patient in no apparent distress; mood and affect are within normal limits. Examination was performed of the: Sun Exposed Exam: Scalp, head, eyes, ears, nose, lips, neck, upper extremities, hands, fingers, fingernails  Examination notable for: Lentigo/lentigines: Scattered pigmented macules that are tan to brown in color and are somewhat non-uniform in shape and concentrated in the sun-exposed areas, Seborrheic Keratosis(es): Stuck-on appearing keratotic papule(s) on the trunk, none  irritated with redness, crusting, edema, and/or partial avulsion, Actinic Damage/Elastosis: chronic sun damage: dyspigmentation, telangiectasia, and wrinkling, Actinic keratosis: Scaly erythematous macule(s) concentrated on sun exposed areas   Examination limited by: Clothing and Patient deferred removal     Left Cheek Pink scaly macules  Assessment & Plan   BENIGN SKIN FINDINGS  - Lentigines  - Seborrheic keratoses - Reassurance provided regarding the benign appearance of lesions noted on exam today; no treatment is indicated in the absence of symptoms/changes. - Reinforced importance of photoprotective strategies including liberal and frequent sunscreen use of a broad-spectrum SPF 30 or greater, use of protective clothing, and sun avoidance for prevention of cutaneous malignancy and photoaging.  Counseled patient on the importance of regular self-skin monitoring as well as routine clinical skin  examinations as scheduled.   ACTINIC DAMAGE - Chronic condition, secondary to cumulative UV/sun exposure - Recommend daily broad spectrum sunscreen SPF 30+ to sun-exposed areas, reapply every 2 hours as needed.  - Staying in the shade or wearing long sleeves, sun glasses (UVA+UVB protection) and wide brim hats (4-inch brim around the entire circumference of the hat) are also recommended for sun protection.  - Call for new or changing lesions. - will RTC for FBSE   Level of service outlined above   Procedures, orders, diagnosis for this visit:  ACTINIC KERATOSIS Left Cheek Actinic keratoses are precancerous spots that appear secondary to cumulative UV radiation exposure/sun exposure over time. They are chronic with expected duration over 1 year. A portion of actinic keratoses will progress to squamous cell carcinoma of the skin. It is not possible to reliably predict which spots will progress to skin cancer and so treatment is recommended to prevent development of skin cancer.  Recommend daily broad spectrum sunscreen SPF 30+ to sun-exposed areas, reapply every 2 hours as needed.  Recommend staying in the shade or wearing long sleeves, sun glasses (UVA+UVB protection) and wide brim hats (4-inch brim around the entire circumference of the hat). Call for new or changing lesions. Destruction of lesion - Left Cheek Complexity: simple   Destruction method: cryotherapy   Informed consent: discussed and consent obtained   Timeout:  patient name, date of birth, surgical site, and procedure verified Lesion destroyed using liquid nitrogen: Yes   Region frozen until ice ball extended beyond lesion: Yes   Cryo cycles: 1 or 2. Outcome: patient tolerated procedure well with no complications   Post-procedure details: wound care instructions given     Actinic keratosis -     Destruction of lesion  Return to clinic: Return for TBSE.  I, Emerick Ege, CMA am acting as scribe for Lauraine JAYSON Kanaris,  MD   Documentation: I have reviewed the above documentation for accuracy and completeness, and I agree with the above.  Lauraine JAYSON Kanaris, MD

## 2023-11-06 NOTE — Patient Instructions (Addendum)

## 2023-12-04 ENCOUNTER — Ambulatory Visit

## 2023-12-11 ENCOUNTER — Other Ambulatory Visit: Payer: Self-pay | Admitting: Emergency Medicine

## 2023-12-11 DIAGNOSIS — I6529 Occlusion and stenosis of unspecified carotid artery: Secondary | ICD-10-CM

## 2023-12-11 NOTE — Telephone Encounter (Signed)
 Please schedule patient for carotid artery ultrasound.  Diagnosis carotid artery calcification.  Please move up her follow-up appointment with me to occur after carotid artery ultrasound.

## 2023-12-17 ENCOUNTER — Ambulatory Visit
Admission: RE | Admit: 2023-12-17 | Discharge: 2023-12-17 | Disposition: A | Attending: Pulmonary Disease | Admitting: Pulmonary Disease

## 2023-12-17 ENCOUNTER — Encounter: Payer: Self-pay | Admitting: Pulmonary Disease

## 2023-12-17 ENCOUNTER — Ambulatory Visit
Admission: RE | Admit: 2023-12-17 | Discharge: 2023-12-17 | Disposition: A | Source: Ambulatory Visit | Attending: Pulmonary Disease | Admitting: Pulmonary Disease

## 2023-12-17 ENCOUNTER — Other Ambulatory Visit
Admission: RE | Admit: 2023-12-17 | Discharge: 2023-12-17 | Disposition: A | Source: Home / Self Care | Attending: Pulmonary Disease | Admitting: Pulmonary Disease

## 2023-12-17 ENCOUNTER — Ambulatory Visit: Admitting: Pulmonary Disease

## 2023-12-17 VITALS — BP 144/90 | HR 71 | Temp 97.9°F | Ht 65.0 in | Wt 235.0 lb

## 2023-12-17 DIAGNOSIS — M3500 Sicca syndrome, unspecified: Secondary | ICD-10-CM

## 2023-12-17 DIAGNOSIS — R0602 Shortness of breath: Secondary | ICD-10-CM

## 2023-12-17 DIAGNOSIS — G4733 Obstructive sleep apnea (adult) (pediatric): Secondary | ICD-10-CM | POA: Diagnosis not present

## 2023-12-17 DIAGNOSIS — Q791 Other congenital malformations of diaphragm: Secondary | ICD-10-CM

## 2023-12-17 LAB — NITRIC OXIDE: Nitric Oxide: 11

## 2023-12-17 LAB — C-REACTIVE PROTEIN: CRP: 1.8 mg/dL — ABNORMAL HIGH (ref <1.0)

## 2023-12-17 LAB — SEDIMENTATION RATE: Sed Rate: 13 mm/h (ref 0–30)

## 2023-12-17 NOTE — Patient Instructions (Signed)
 VISIT SUMMARY:  Today, you were seen for your ongoing shortness of breath, which has been affecting you for several years, especially during physical activities. We also discussed your history of sleep apnea, dry mouth, and the possibility of Sjogren's syndrome.  YOUR PLAN:  -SHORTNESS OF BREATH DUE TO DIAPHRAGMATIC MUSCLE WEAKNESS: Your shortness of breath is likely due to weakness in the diaphragm muscles, which help you breathe. This weakness may be related to your past surgeries and conditions. We have ordered a sniff test to check how your diaphragm moves and pulmonary function tests to see how well your lungs are working. We also talked about how losing weight and better managing your sleep apnea could help improve your symptoms.  -OBSTRUCTIVE SLEEP APNEA: Obstructive sleep apnea is a condition where your breathing stops and starts during sleep. This can make you feel tired and may affect your diaphragm muscles. Since you are not currently using your CPAP machine due to discomfort, we have ordered a new sleep study to reassess the severity of your sleep apnea and to help us  find a more comfortable treatment option for you.  -DRY MOUTH (XEROSTOMIA), EVALUATION FOR SJOGREN'S SYNDROME: You have been experiencing dry mouth for many years, which could be related to your medications or a condition called Sjogren's syndrome, which affects moisture-producing glands. We have ordered a blood test to check for Sjogren's syndrome.  INSTRUCTIONS:  Please complete the sniff test and pulmonary function tests as scheduled. Also, attend the in-lab sleep study to reassess your sleep apnea. Follow up with us  after these tests are completed to discuss the results and next steps.

## 2023-12-17 NOTE — Progress Notes (Unsigned)
 Subjective:    Patient ID: Robin Howard, female    DOB: Nov 16, 1960, 63 y.o.   MRN: [id number]  Patient Care Team: Myrna Kay, MD as PCP - General (Internal Medicine) Darron Deatrice LABOR, MD as PCP - Cardiology (Cardiology) Cindie Jesusa HERO, RN as Registered Nurse Dannielle, Arlean FALCON, RN (Inactive) as Registered Nurse Jacobo, Evalene PARAS, MD as Consulting Physician (Oncology) Dellie Louanne MATSU, MD (General Surgery) Darron Deatrice LABOR, MD as Consulting Physician (Cardiology) Nicholaus Raoul NOVAK, RN as Oncology Nurse Navigator Tobie Adline SAILOR, MD as Referring Physician (Otolaryngology)  Chief Complaint  Patient presents with  . Shortness of Breath    Chronic cough x several years. Shortness of breath on exertion x 5 years.      BACKGROUND:   HPI    Review of Systems A 10 point review of systems was performed and it is as noted above otherwise negative.   Past Medical History:  Diagnosis Date  . Anemia    h/o  . Breast cancer (HCC) 07/2014   T1c,N0, ER/ PR negative, Her 2 neu positive. Wide excision, SLN, Mammosite, adjuvant chemotherapy.   . Bronchitis 04-2014  . Coronary artery disease, non-occlusive    a. LHC 4/16 w/ mild nonobstructive disease affecting the LAD with tortuous vessels. CTA in 2017 showed only 30% disease in the LAD. b. Lexiscan  MV 6/18: neg for signficant ischemia, EF 54%, low risk scan  . Detached retina 1997  . Family history of adverse reaction to anesthesia    pts dad has pseudocholinestrace deficieny  . Gastritis   . GERD (gastroesophageal reflux disease)   . History of nuclear stress test    a. MV 6/18: no significant ischemia, normal wall motion, EF 54%. Low risk scan  . HTN (hypertension)   . 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 Hypothyroidism   . Insomnia   . Last menstrual period (LMP) > 10 days ago    LMP 2014  . Major depressive disorder, recurrent   .  Migraines   . Morbid obesity (HCC)   . Orthostatic hypotension    a. TTE 1/19:  EF 55-60%, normal wall motion, grade 2 diastolic dysfunction, mild aortic regurgitation, mildly calcified mitral annulus, trivial pericardial effusion was identified  . OSA (obstructive sleep apnea)    untreated due to lack of insurance  . Overactive bladder   . Pericarditis 1997  . Personal history of chemotherapy   . Personal history of radiation therapy    Mammosite  . Thyroid  cancer (HCC) 12/2016   [facility]  . Thyroid  nodule 10/24/2016   4.7 cm, October 2018; referred for biopsy    Past Surgical History:  Procedure Laterality Date  . BACK SURGERY     L4-5  . BREAST BIOPSY Left 07/2014   [facility]  . BREAST CYST ASPIRATION Left 12/13/2014   3:00 position 2 cmfn, fat necrosis per Dr. Fredirick notes  . BREAST LUMPECTOMY Left 2016   Invasive mammary and DCIS neg margins  . BREAST LUMPECTOMY WITH SENTINEL LYMPH NODE BIOPSY Left 08/13/2014   Procedure: BREAST LUMPECTOMY WITH SENTINEL LYMPH NODE BIOPSY;  Surgeon: Reyes LELON Cota, MD;  Location: [facility] ORS;  Service: General;  Laterality: Left;  . BREAST MAMMOSITE Left 08/30/2014   Procedure: MAMMOSITE BREAST;  Surgeon: Reyes LELON Cota, MD;  Location: [facility] ORS;  Service: General;  Laterality: Left;  . CESAREAN SECTION    .  COLONOSCOPY WITH PROPOFOL  N/A 04/18/2016   Procedure: COLONOSCOPY WITH PROPOFOL ;  Surgeon: Reyes LELON Cota, MD;  Location: [facility] ENDOSCOPY;  Service: Endoscopy;  Laterality: N/A;  . CORONARY ANGIOPLASTY  April 2016  . EYE SURGERY Left 10/16/2015   retina repair  . FOOT SURGERY    . FORAMINOTOMY 1 LEVEL  [id number]  . KNEE SURGERY    . LAMINECTOMY  [id number]  . PORTACATH PLACEMENT Right 08/30/2014   Procedure: INSERTION PORT-A-CATH;  Surgeon: Reyes LELON Cota, MD;  Location: [facility] ORS;  Service: General;  Laterality: Right;  . POSTERIOR LAMINECTOMY / DECOMPRESSION LUMBAR SPINE  10/29/2016   L3-5  . RETINAL DETACHMENT SURGERY Right 1997  .  RETINAL DETACHMENT SURGERY  01/01/2018  . RIGHT/LEFT HEART CATH AND CORONARY ANGIOGRAPHY Bilateral 02/11/2017   Procedure: RIGHT/LEFT HEART CATH AND CORONARY ANGIOGRAPHY;  Surgeon: Darron Deatrice LABOR, MD;  Location: [facility] INVASIVE CV LAB;  Service: Cardiovascular;  Laterality: Bilateral;  . TOTAL THYROIDECTOMY  12/19/2016  . TOTAL THYROIDECTOMY Bilateral 12/19/2016   [facility]    Patient Active Problem List   Diagnosis Date Noted  . Allergic rhinitis 03/20/2018  . Cervical cancer screening 03/20/2018  . Right retinal detachment 01/02/2018  . Breast nodule 06/21/2017  . Sleep disturbance 05/16/2017  . Hx of papillary thyroid  carcinoma 02/05/2017  . 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 Thyroid  cancer (HCC) 01/02/2017  . Lumbar radiculopathy 10/29/2016  . Medication monitoring encounter 08/22/2016  . Cervical spondylosis with radiculopathy 07/23/2016  . Perioral dermatitis 05/18/2016  . Obesity (BMI 35.0-39.9 without comorbidity) 03/04/2016  . Hypocalcemia 09/22/2015  . Coronary artery disease   . Headache 03/30/2015  . NSTEMI (non-ST elevated myocardial infarction) (HCC) 01/25/2015  . Angina pectoris 01/24/2015  . Noninfective gastroenteritis and colitis, unspecified 09/23/2014  . Breast cancer of lower-outer quadrant of left female breast (HCC) 08/10/2014  . Migraine 07/15/2014  . Fatigue 07/15/2014  . Major depressive disorder, recurrent (HCC)   . HTN (hypertension)   . Overactive bladder   . Hypothyroidism   . OSA (obstructive sleep apnea)   . GERD (gastroesophageal reflux disease)   . Chronic headache   . Morbid obesity (HCC)     Family History  Problem Relation Age of Onset  . Alcohol abuse Mother   . Thyroid  disease Mother   . Hypertension Mother   . COPD Mother   . Cancer Father        liposarcoma  . Congestive Heart Failure Father        V Tach  . Depression Father   . Hypertension Father   . Heart attack Father   . Arrhythmia Father   . Heart failure Father    . Cancer Maternal Uncle        Pancreatic cancer  . Heart disease Paternal Grandfather 73       MI  . Diabetes Other   . Cancer Sister        breast  . Breast cancer Sister 56  . Cancer Brother   . Liver cancer Brother     Social History   Tobacco Use  . Smoking status: Never  . Smokeless tobacco: Never  Substance Use Topics  . Alcohol use: No    Allergies  Allergen Reactions  . Metoclopramide Other (See Comments), Shortness Of Breath and Anaphylaxis    This medication caused a dystonic reaction.  SABRA Paxil [Paroxetine Hcl] Other (See Comments)    Reaction:  Blurred vision  . Tape Rash  . Tegaderm Ag Mesh [Silver] Rash and  Other (See Comments)    Only if on skin for a long time  . Ultram [Tramadol] Itching and Rash    Current Meds  Medication Sig  . acetaminophen  (TYLENOL ) 500 MG tablet Take 2 tablets (1,000 mg total) every 6 (six) hours as needed by mouth for mild pain or headache. Maximum dose of acetaminophen  3,000 mg total per day  . Ascorbic Acid (VITAMIN C) 100 MG tablet Take 100 mg by mouth daily.  . aspirin  EC 81 MG tablet Take 81 mg by mouth daily.  SABRA atorvastatin (LIPITOR) 20 MG tablet Take 1 tablet by mouth daily.  . buPROPion  (WELLBUTRIN  XL) 150 MG 24 hr tablet Take 450 mg by mouth daily.  . celecoxib (CELEBREX) 100 MG capsule Take 100 mg by mouth daily.  . cholecalciferol (VITAMIN D3) 25 MCG (1000 UT) tablet Take 1,000 Units by mouth daily.  . metoprolol  tartrate (LOPRESSOR ) 25 MG tablet Take 1 tablet (25 mg total) by mouth daily.  . Multiple Vitamins-Iron (MULTIVITAMIN/IRON PO) Take by mouth.  . pantoprazole  (PROTONIX ) 40 MG tablet Take 40 mg by mouth daily. (Patient taking differently: Take 40 mg by mouth 2 (two) times daily.)  . sertraline (ZOLOFT) 50 MG tablet Take 50 mg by mouth daily.  SABRA tiZANidine (ZANAFLEX) 4 MG tablet Take 4 mg by mouth at bedtime.  . valsartan (DIOVAN) 80 MG tablet Take 80 mg by mouth daily.    Immunization History   Administered Date(s) Administered  . Influenza, Seasonal, Injecte, Preservative Fre 10/02/2023  . Influenza,inj,Quad PF,6+ Mos 02/28/2016, 10/31/2016  . PFIZER(Purple Top)SARS-COV-2 Vaccination 06/06/2019, 07/04/2019  . Pneumococcal Conjugate Pcv21, Polysaccharide Crm197 Conjugaf 10/02/2023  . Td 11/04/2007        Objective:     Vitals:   12/17/23 1433  BP: (!) 144/90  Pulse: 71  Temp: 97.9 F (36.6 C)  Height: 5' 5 (1.651 m)  Weight: 235 lb (106.6 kg)  SpO2: 97%  TempSrc: Temporal  BMI (Calculated): 39.11     SpO2: 97 %  GENERAL: HEAD: Normocephalic, atraumatic.  EYES: Pupils equal, round, reactive to light.  No scleral icterus.  MOUTH:  NECK: Supple. No thyromegaly. Trachea midline. No JVD.  No adenopathy. PULMONARY: Good air entry bilaterally.  No adventitious sounds. CARDIOVASCULAR: S1 and S2. Regular rate and rhythm.  ABDOMEN: MUSCULOSKELETAL: No joint deformity, no clubbing, no edema.  NEUROLOGIC:  SKIN: Intact,warm,dry. PSYCH:        Assessment & Plan:     ICD-10-CM   1. Shortness of breath  R06.02 Nitric oxide       Orders Placed This Encounter  Procedures  . Nitric oxide     No orders of the defined types were placed in this encounter.    Advised if symptoms do not improve or worsen, to please contact office for sooner follow up or seek emergency care.    I spent xxx minutes of dedicated to the care of this patient on the date of this encounter to include pre-visit review of records, face-to-face time with the patient discussing conditions above, post visit ordering of testing, clinical documentation with the electronic health record, making appropriate referrals as documented, and communicating necessary findings to members of the patients care team.   C. Leita Sanders, MD Advanced Bronchoscopy [facility]    *This note was dictated using voice recognition software/Dragon.  Despite best efforts to proofread,  errors can occur which can change the meaning. Any transcriptional errors that result from this process are unintentional and may not be  fully corrected at the time of dictation.

## 2023-12-18 ENCOUNTER — Encounter: Payer: Self-pay | Admitting: Pulmonary Disease

## 2023-12-19 LAB — SJOGRENS SYNDROME-A EXTRACTABLE NUCLEAR ANTIBODY: SSA (Ro) (ENA) Antibody, IgG: <0.2 AI (ref 0.0–0.9)

## 2023-12-19 LAB — RHEUMATOID FACTOR: Rheumatoid fact SerPl-aCnc: <10 [IU]/mL (ref <14.0)

## 2023-12-19 LAB — SJOGRENS SYNDROME-B EXTRACTABLE NUCLEAR ANTIBODY: SSB (La) (ENA) Antibody, IgG: <0.2 AI (ref 0.0–0.9)

## 2023-12-19 LAB — ANA: Anti Nuclear Antibody (ANA): NEGATIVE

## 2023-12-20 ENCOUNTER — Ambulatory Visit: Attending: Physician Assistant

## 2023-12-20 DIAGNOSIS — I6523 Occlusion and stenosis of bilateral carotid arteries: Secondary | ICD-10-CM | POA: Diagnosis not present

## 2023-12-20 DIAGNOSIS — I6529 Occlusion and stenosis of unspecified carotid artery: Secondary | ICD-10-CM

## 2023-12-23 ENCOUNTER — Ambulatory Visit

## 2023-12-23 ENCOUNTER — Ambulatory Visit: Payer: Self-pay | Admitting: Physician Assistant

## 2023-12-23 DIAGNOSIS — L570 Actinic keratosis: Secondary | ICD-10-CM

## 2023-12-23 DIAGNOSIS — L821 Other seborrheic keratosis: Secondary | ICD-10-CM

## 2023-12-23 DIAGNOSIS — Z1283 Encounter for screening for malignant neoplasm of skin: Secondary | ICD-10-CM

## 2023-12-23 DIAGNOSIS — D229 Melanocytic nevi, unspecified: Secondary | ICD-10-CM

## 2023-12-23 DIAGNOSIS — L814 Other melanin hyperpigmentation: Secondary | ICD-10-CM | POA: Diagnosis not present

## 2023-12-23 DIAGNOSIS — Z872 Personal history of diseases of the skin and subcutaneous tissue: Secondary | ICD-10-CM

## 2023-12-23 DIAGNOSIS — L578 Other skin changes due to chronic exposure to nonionizing radiation: Secondary | ICD-10-CM

## 2023-12-23 DIAGNOSIS — L853 Xerosis cutis: Secondary | ICD-10-CM

## 2023-12-23 DIAGNOSIS — W908XXA Exposure to other nonionizing radiation, initial encounter: Secondary | ICD-10-CM | POA: Diagnosis not present

## 2023-12-23 DIAGNOSIS — L820 Inflamed seborrheic keratosis: Secondary | ICD-10-CM | POA: Diagnosis not present

## 2023-12-23 DIAGNOSIS — D1801 Hemangioma of skin and subcutaneous tissue: Secondary | ICD-10-CM

## 2023-12-23 DIAGNOSIS — L249 Irritant contact dermatitis, unspecified cause: Secondary | ICD-10-CM

## 2023-12-23 MED ORDER — TACROLIMUS 0.1 % EX OINT: TOPICAL_OINTMENT | CUTANEOUS | 5 refills | Status: AC

## 2023-12-23 NOTE — Progress Notes (Signed)
 Subjective   Robin Howard is a 63 y.o. female who presents for the following: Total body skin exam for skin cancer screening and mole check. The patient has spots, moles and lesions to be evaluated, some may be new or changing and the patient may have concern these could be cancer.. Patient is established patient   Today patient reports: Areas of concern on the chest, lower extremities, on scalp and face.   Review of Systems:    No other skin or systemic complaints except as noted in HPI or Assessment and Plan.  The following portions of the chart were reviewed this encounter and updated as appropriate: medications, allergies, medical history  Relevant Medical History:  n/a   Objective  (SKPE) Well appearing patient in no apparent distress; mood and affect are within normal limits. Examination was performed of the: Full Skin Examination: scalp, head, eyes, ears, nose, lips, neck, chest, axillae, abdomen, back, buttocks, bilateral upper extremities, bilateral lower extremities, hands, feet, fingers, toes, fingernails, and toenails.   Examination notable for: SKIN EXAM, Angioma(s): Scattered red vascular papule(s)  , Lentigo/lentigines: Scattered pigmented macules that are tan to brown in color and are somewhat non-uniform in shape and concentrated in the sun-exposed areas, Nevus/nevi: Scattered well-demarcated, regular, pigmented macule(s) and/or papule(s)  , Seborrheic Keratosis(es): Stuck-on appearing keratotic papule(s) on the trunk, many irritated with redness, crusting, edema, and/or partial avulsion, Actinic Damage/Elastosis: chronic sun damage: dyspigmentation, telangiectasia, and wrinkling, Actinic keratosis: Scaly erythematous macule(s) concentrated on sun exposed areas  Diffuse xerosis and scale accentuated on the extremities, particularly the lower extremities and feet Examination limited by: Undergarments and Patient deferred removal     Left cheek Pink scaly macules Left  temple x1, Chest x2, Right foot x1, Right thigh x4, Left thigh x1 (9) Stuck on waxy paps with erythema  Assessment & Plan  (SKAP)   SKIN CANCER SCREENING PERFORMED TODAY.  BENIGN SKIN FINDINGS  - Lentigines  - Seborrheic keratoses  - Hemangiomas   - Nevus/Multiple Benign Nevi - Reassurance provided regarding the benign appearance of lesions noted on exam today; no treatment is indicated in the absence of symptoms/changes. - Reinforced importance of photoprotective strategies including liberal and frequent sunscreen use of a broad-spectrum SPF 30 or greater, use of protective clothing, and sun avoidance for prevention of cutaneous malignancy and photoaging.  Counseled patient on the importance of regular self-skin monitoring as well as routine clinical skin examinations as scheduled.   ACTINIC DAMAGE - Chronic condition, secondary to cumulative UV/sun exposure - Recommend daily broad spectrum sunscreen SPF 30+ to sun-exposed areas, reapply every 2 hours as needed.  - Staying in the shade or wearing long sleeves, sun glasses (UVA+UVB protection) and wide brim hats (4-inch brim around the entire circumference of the hat) are also recommended for sun protection.  - Call for new or changing lesions.  Personal history of actinic keratosis  - Reviewed medical history for full details  - Reviewed sun protective measures as above - Encouraged full body skin exams     Irritant Dermatitis on the upper extremities and lower lip Chronic and persistent condition with duration or expected duration over one year. Condition is symptomatic/ bothersome to patient. Not currently at goal. Start tacrolimus  ointment 0.1% twice daily on affected areas Educated about black box warning (and also that recent studies show no increased malignancy risk) and common adverse effects such as burning with application (advised to cool in fridge and only apply to bone-dry skin). Given location  of eruption, unsafe to use  daily topical CS to area. Patient requires topical calcineurin inhibitor given high risk of dyspigmentation and atrophy from topical CS use in sensitive area.  Level of service outlined above   Patient instructions (SKPI)   Procedures, orders, diagnosis for this visit:  ACTINIC KERATOSIS Left cheek Actinic keratoses are precancerous spots that appear secondary to cumulative UV radiation exposure/sun exposure over time. They are chronic with expected duration over 1 year. A portion of actinic keratoses will progress to squamous cell carcinoma of the skin. It is not possible to reliably predict which spots will progress to skin cancer and so treatment is recommended to prevent development of skin cancer.  Recommend daily broad spectrum sunscreen SPF 30+ to sun-exposed areas, reapply every 2 hours as needed.  Recommend staying in the shade or wearing long sleeves, sun glasses (UVA+UVB protection) and wide brim hats (4-inch brim around the entire circumference of the hat). Call for new or changing lesions. Destruction of lesion - Left cheek Complexity: simple   Destruction method: cryotherapy   Informed consent: discussed and consent obtained   Timeout:  patient name, date of birth, surgical site, and procedure verified Lesion destroyed using liquid nitrogen: Yes   Region frozen until ice ball extended beyond lesion: Yes   Cryo cycles: 1 or 2. Outcome: patient tolerated procedure well with no complications   Post-procedure details: wound care instructions given    INFLAMED SEBORRHEIC KERATOSIS (9) Left temple x1, Chest x2, Right foot x1, Right thigh x4, Left thigh x1 (9) Symptomatic, irritating, patient would like treated. Destruction of lesion - Left temple x1, Chest x2, Right foot x1, Right thigh x4, Left thigh x1 (9) Complexity: simple   Destruction method: cryotherapy   Informed consent: discussed and consent obtained   Timeout:  patient name, date of birth, surgical site, and  procedure verified Lesion destroyed using liquid nitrogen: Yes   Region frozen until ice ball extended beyond lesion: Yes   Cryo cycles: 1 or 2. Outcome: patient tolerated procedure well with no complications   Post-procedure details: wound care instructions given     Actinic keratosis -     Destruction of lesion  Inflamed seborrheic keratosis -     Destruction of lesion  Other orders -     Tacrolimus ; Apply 2 grams twice daily to affected areas of skin  Dispense: 100 g; Refill: 5    Return to clinic: Return in about 1 year (around 12/22/2024) for TBSE.  I, Emerick Ege, CMA am acting as scribe for Lauraine JAYSON Kanaris, MD.   Documentation: I have reviewed the above documentation for accuracy and completeness, and I agree with the above.  Lauraine JAYSON Kanaris, MD

## 2023-12-23 NOTE — Patient Instructions (Addendum)

## 2023-12-24 ENCOUNTER — Inpatient Hospital Stay: Admission: RE | Admit: 2023-12-24 | Discharge: 2023-12-24 | Attending: Pulmonary Disease | Admitting: Pulmonary Disease

## 2023-12-24 ENCOUNTER — Ambulatory Visit (INDEPENDENT_AMBULATORY_CARE_PROVIDER_SITE_OTHER): Payer: Self-pay | Admitting: Pulmonary Disease

## 2023-12-24 DIAGNOSIS — R0602 Shortness of breath: Secondary | ICD-10-CM

## 2023-12-25 ENCOUNTER — Telehealth: Payer: Self-pay

## 2023-12-25 ENCOUNTER — Ambulatory Visit: Payer: Self-pay | Admitting: Pulmonary Disease

## 2023-12-25 NOTE — Telephone Encounter (Signed)
 Copied from CRM #[id number]. Topic: Clinical - Prescription Issue >> Dec 25, 2023 10:15 AM Leila BROCKS wrote: Reason for CRM: Patient [phone] states Dr. Tamea 12/17/23 prescribed a new medication, does not know the name of the medication. Patient stopped by the office yesterday, and they toke down her information. Patient still has not heard from the office and the pharmacy does not have any medications. Please advise and call back.   [facility] [place], [place] - [address] [place] [address] [phone]

## 2023-12-25 NOTE — Telephone Encounter (Signed)
 Spoke with pt to clarify that there were no new medications sent in to her pharmacy, it was a system error since we updated her usage of her protonix  from daily to two times daily, which triggered the medication changes on her AVS. She expressed understanding.   NFN.

## 2023-12-26 NOTE — Progress Notes (Deleted)
 Cardiology Office Note    Date:  12/26/2023   ID:  Robin Howard, DOB 09-May-1960, MRN [id number]  PCP:  Myrna Kay, MD  Cardiologist:  Deatrice Cage, MD  Electrophysiologist:  None   Chief Complaint: Follow up  History of Present Illness:   Robin Howard is a 63 y.o. female with history of mild nonobstructive CAD, HTN, HLD, chronic back pain, breast cancer diagnosed in 2017 status post lumpectomy and chemotherapy, thyroid  cancer status post thyroidectomy with subsequent hypothyroidism on replacement therapy, carotid artery disease, obesity, and sleep apnea who presents for for follow-up of carotid artery ultrasound.  She was admitted to the hospital in 04/2014 with chest pain and mildly elevated troponin with LHC demonstrating mild nonobstructive LAD disease.  EF was normal.  In 2019, she had worsening dyspnea and underwent an [facility] which showed minor luminal irregularities with no evidence of obstructive CAD, normal LV systolic function, and normal right heart catheterization.     She was seen in the office on 06/03/2023 and reported epigastric/chest burning sensation that was not improved with the addition of pantoprazole .  Discomfort was not exacerbated by exertion and would randomly occur.  In this setting she underwent echo in 05/2023 that showed an EF of 50 to 55%, mild LVH, grade 1 diastolic dysfunction, mildly reduced RV systolic function with normal ventricular cavity size, trivial mitral regurgitation, and borderline dilatation of the aortic root measuring 39 mm and ascending aorta measuring 36 mm.  Coronary CTA on 06/27/2023 showed a calcium score of 242 which was the 95th percentile.  There was 25 to 49% proximal LAD stenosis and less than 25% RCA stenosis.  She followed up in our office in 08/2023 continuing to note exertional shortness of breath and improvement dizziness following discontinuation of Flexeril .  She underwent CT of the TMJ at [facility] in 07/2023 which incidentally showed  carotid artery calcification.  Carotid artery ultrasound in 12/2023 demonstrated 1 to 39% bilateral carotid artery stenosis.  ***   Labs independently reviewed: 09/2023 - A1c 5.2, potassium 4.1, BUN 11, serum creatinine 0.92, albumin 4.1, AST/ALT normal 07/2023 -TSH normal 05/2023 - TC 116, TG 19, HDL 53, LDL 42, Hgb 12.3, PLT 256   Past Medical History:  Diagnosis Date   Anemia    h/o   Breast cancer (HCC) 07/2014   T1c,N0, ER/ PR negative, Her 2 neu positive. Wide excision, SLN, Mammosite, adjuvant chemotherapy.    Bronchitis 04-2014   Coronary artery disease, non-occlusive    a. LHC 4/16 w/ mild nonobstructive disease affecting the LAD with tortuous vessels. CTA in 2017 showed only 30% disease in the LAD. b. Lexiscan  MV 6/18: neg for signficant ischemia, EF 54%, low risk scan   Detached retina 1997   Family history of adverse reaction to anesthesia    pts dad has pseudocholinestrace deficieny   Gastritis    GERD (gastroesophageal reflux disease)    History of nuclear stress test    a. MV 6/18: no significant ischemia, normal wall motion, EF 54%. Low risk scan   HTN (hypertension)    Hx of papillary thyroid  carcinoma 02/05/2017   Dec 2018; T3aN0Mx  s/p total thyroidectomy Dec 19, 2016, s/p I-131 RAI 01/02/17   Hypothyroidism    Insomnia    Last menstrual period (LMP) > 10 days ago    LMP 2014   Major depressive disorder, recurrent    Migraines    Morbid obesity (HCC)    Orthostatic hypotension    a. TTE  1/19:  EF 55-60%, normal wall motion, grade 2 diastolic dysfunction, mild aortic regurgitation, mildly calcified mitral annulus, trivial pericardial effusion was identified   OSA (obstructive sleep apnea)    untreated due to lack of insurance   Overactive bladder    Pericarditis 1997   Personal history of chemotherapy    Personal history of radiation therapy    Mammosite   Thyroid  cancer (HCC) 12/2016   [facility]   Thyroid  nodule 10/24/2016   4.7 cm, October 2018; referred for  biopsy    Past Surgical History:  Procedure Laterality Date   BACK SURGERY     L4-5   BREAST BIOPSY Left 07/2014   [facility]   BREAST CYST ASPIRATION Left 12/13/2014   3:00 position 2 cmfn, fat necrosis per Dr. Fredirick notes   BREAST LUMPECTOMY Left 2016   Invasive mammary and DCIS neg margins   BREAST LUMPECTOMY WITH SENTINEL LYMPH NODE BIOPSY Left 08/13/2014   Procedure: BREAST LUMPECTOMY WITH SENTINEL LYMPH NODE BIOPSY;  Surgeon: Reyes LELON Cota, MD;  Location: [facility] ORS;  Service: General;  Laterality: Left;   BREAST MAMMOSITE Left 08/30/2014   Procedure: MAMMOSITE BREAST;  Surgeon: Reyes LELON Cota, MD;  Location: [facility] ORS;  Service: General;  Laterality: Left;   CESAREAN SECTION     COLONOSCOPY WITH PROPOFOL  N/A 04/18/2016   Procedure: COLONOSCOPY WITH PROPOFOL ;  Surgeon: Reyes LELON Cota, MD;  Location: [facility] ENDOSCOPY;  Service: Endoscopy;  Laterality: N/A;   CORONARY ANGIOPLASTY  April 2016   EYE SURGERY Left 10/16/2015   retina repair   FOOT SURGERY     FORAMINOTOMY 1 LEVEL  [id number]   KNEE SURGERY     LAMINECTOMY  [id number]   PORTACATH PLACEMENT Right 08/30/2014   Procedure: INSERTION PORT-A-CATH;  Surgeon: Reyes LELON Cota, MD;  Location: [facility] ORS;  Service: General;  Laterality: Right;   POSTERIOR LAMINECTOMY / DECOMPRESSION LUMBAR SPINE  10/29/2016   L3-5   RETINAL DETACHMENT SURGERY Right 1997   RETINAL DETACHMENT SURGERY  01/01/2018   RIGHT/LEFT HEART CATH AND CORONARY ANGIOGRAPHY Bilateral 02/11/2017   Procedure: RIGHT/LEFT HEART CATH AND CORONARY ANGIOGRAPHY;  Surgeon: Darron Deatrice LABOR, MD;  Location: [facility] INVASIVE CV LAB;  Service: Cardiovascular;  Laterality: Bilateral;   TOTAL THYROIDECTOMY  12/19/2016   TOTAL THYROIDECTOMY Bilateral 12/19/2016   [facility]    Current Medications: Active Medications[1]  Allergies:   Metoclopramide, Paxil [paroxetine hcl], Tape, Tegaderm ag mesh [silver], and Ultram [tramadol]   Social History   Socioeconomic History   Marital  status: Divorced    Spouse name: Not on file   Number of children: Not on file   Years of education: Not on file   Highest education level: Not on file  Occupational History   Not on file  Tobacco Use   Smoking status: Never   Smokeless tobacco: Never  Vaping Use   Vaping status: Never Used  Substance and Sexual Activity   Alcohol use: No   Drug use: No   Sexual activity: Not Currently  Other Topics Concern   Not on file  Social History Narrative   Not on file   Social Drivers of Health   Tobacco Use: Low Risk (12/18/2023)   Patient History    Smoking Tobacco Use: Never    Smokeless Tobacco Use: Never    Passive Exposure: Not on file  Financial Resource Strain: Low Risk (10/02/2023)   Received from [facility]   Overall Financial Resource Strain (CARDIA)    How hard is  it for you to pay for the very basics like food, housing, medical care, and heating?: Not very hard  Food Insecurity: Food Insecurity Present (10/02/2023)   Received from [facility]   Epic    Within the past 12 months, you worried that your food would run out before you got the money to buy more.: Never true    Within the past 12 months, the food you bought just didn't last and you didn't have money to get more.: Sometimes true  Transportation Needs: No Transportation Needs (10/02/2023)   Received from [facility]   PRAPARE - Transportation    Lack of Transportation (Medical): No    Lack of Transportation (Non-Medical): No  Physical Activity: Inactive (10/02/2023)   Received from [facility]   Exercise Vital Sign    On average, how many days per week do you engage in moderate to strenuous exercise (like a brisk walk)?: 0 days    On average, how many minutes do you engage in exercise at this level?: 0 min  Stress: Stress Concern Present (10/02/2023)   Received from [facility] - Occupational Stress Questionnaire    Do you feel stress - tense,  restless, nervous, or anxious, or unable to sleep at night because your mind is troubled all the time - these days?: Very much  Social Connections: Socially Isolated (10/02/2023)   Received from [facility]   Social Connection and Isolation Panel    In a typical week, how many times do you talk on the phone with family, friends, or neighbors?: Once a week    How often do you get together with friends or relatives?: Never    How often do you attend church or religious services?: Never    Do you belong to any clubs or organizations such as church groups, unions, fraternal or athletic groups, or school groups?: No    How often do you attend meetings of the clubs or organizations you belong to?: Patient declined    Are you married, widowed, divorced, separated, never married, or living with a partner?: Divorced  Depression (PHQ2-9): Not on file  Alcohol Screen: Not on file  Housing: Not on file  Utilities: Low Risk (10/02/2023)   Received from [facility]   Utilities    Within the past 12 months, have you been unable to get utilities(heat, electricity) when it was really needed?: No  Health Literacy: Low Risk (10/02/2023)   Received from [facility]    How often do you need to have someone help you when you read instructions, pamphlets, or other written material from your doctor or pharmacy?: Never     Family History:  The patient's family history includes Alcohol abuse in her mother; Arrhythmia in her father; Breast cancer (age of onset: 59) in her sister; COPD in her mother; Cancer in her brother, father, maternal uncle, and sister; Congestive Heart Failure in her father; Depression in her father; Diabetes in an other family member; Heart attack in her father; Heart disease (age of onset: 57) in her paternal grandfather; Heart failure in her father; Hypertension in her father and mother; Liver cancer in her brother; Thyroid  disease in her mother.  ROS:   12-point  review of systems is negative unless otherwise noted in the HPI.   EKGs/Labs/Other Studies Reviewed:    Studies reviewed were summarized above. The additional studies were reviewed today:  Carotid artery ultrasound 12/20/2023: Summary:  Right Carotid: Velocities in the right ICA are consistent with a 1-39%  stenosis.   Left Carotid: Velocities in the left ICA are consistent with a 1-39%  stenosis.   Vertebrals: Bilateral vertebral arteries demonstrate antegrade flow.  Subclavians: Normal flow hemodynamics were seen in bilateral subclavian arteries.  __________  Coronary CTA 06/27/2023: FINDINGS: Aorta:  Normal size.  No calcifications.  No dissection.   Aortic Valve:  Trileaflet.  No calcifications.   Coronary Arteries:  Normal coronary origin.  Right dominance.   RCA is a dominant artery. There is calcified plaque proximally causing minimal stenosis (<25%).   Left main gives rise to LAD and LCX arteries. LM has no disease.   LAD has calcified plaque proximally causing mild stenosis (25-49%).   LCX is a non-dominant artery.  There is no plaque.   Other findings:   Normal pulmonary vein drainage into the left atrium.   Normal left atrial appendage without a thrombus.   Normal size of the pulmonary artery.   IMPRESSION: 1. Coronary calcium score of 242. This was 95th percentile for age and sex matched control. 2. Normal coronary origin with right dominance. 3. Mild proximal LAD stenosis (25-49%). 4. Minimal RCA stenosis (<25%). 5. CAD-RADS 2. Mild non-obstructive CAD (25-49%). Consider non-atherosclerotic causes of chest pain. Consider preventive therapy and risk factor modification. __________   2D echo 06/14/2023: 1. Left ventricular ejection fraction, by estimation, is 50 to 55%. The  left ventricle has low normal function. Left ventricular endocardial  border not optimally defined to evaluate regional wall motion. There is  mild left ventricular hypertrophy.  Left  ventricular diastolic parameters are consistent with Grade I diastolic  dysfunction (impaired relaxation).   2. Right ventricular systolic function is mildly reduced. The right  ventricular size is normal. Mildly increased right ventricular wall  thickness.   3. The mitral valve is normal in structure. Trivial mitral valve  regurgitation.   4. The aortic valve is tricuspid. Aortic valve regurgitation is not  visualized. No aortic stenosis is present.   5. Aortic dilatation noted. There is borderline dilatation of the aortic  root, measuring 39 mm. There is borderline dilatation of the ascending  aorta, measuring 36 mm.  __________   2D echo 03/22/2021: 1. Left ventricular ejection fraction, by estimation, is 55 to 60%. Left  ventricular ejection fraction by 2D MOD biplane is 57.6 %. The left  ventricle has normal function. The left ventricle has no regional wall  motion abnormalities. Left ventricular  diastolic parameters were normal.   2. Right ventricular systolic function is normal. The right ventricular  size is normal.   3. The mitral valve is normal in structure. Trivial mitral valve  regurgitation.   4. The aortic valve is tricuspid. Aortic valve regurgitation is not  visualized.   5. Aortic dilatation noted. There is mild dilatation of the aortic root,  measuring 39 mm.   6. The inferior vena cava is dilated in size with >50% respiratory  variability, suggesting right atrial pressure of 8 mmHg.   Comparison(s): LVEF 55-60%.  __________   [facility] 02/11/2017: Prox LAD lesion is 15% stenosed. The left ventricular systolic function is normal. LV end diastolic pressure is normal. The left ventricular ejection fraction is 55-65% by visual estimate.   1.  Minor irregularities with no evidence of obstructive coronary artery disease. 2.  Normal LV systolic function. 3.  Right heart catheterization showed normal filling pressures, normal pulmonary pressure and normal  cardiac output.  RA pressure was 2, RV pressure was 30 over 8 mmHg, pulmonary capillary wedge pressure was 10 mmHg, PA pressure was 27/13 with a mean of 19 mmHg.   Recommendations: Shortness of breath and chest pain do not seem to be cardiac in origin. __________   2D echo 02/05/2017: - Procedure narrative: Transthoracic echocardiography. Image    quality was suboptimal. The study was technically difficult, as a    result of poor acoustic windows and body habitus.  - Left ventricle: The cavity size was normal. Wall thickness was    increased in a pattern of mild LVH. Systolic function was normal.    The estimated ejection fraction was in the range of 55% to 65%.    The study is not technically sufficient to allow evaluation of LV    diastolic function.  - Right ventricle: The cavity size was normal. Wall thickness was    normal. Systolic function was normal.  __________   Lexiscan  MPI 06/27/2016: Pharmacological myocardial perfusion imaging study with no significant ischemia Normal wall motion, EF estimated at 54% No EKG changes concerning for ischemia at peak stress or in recovery. Nonspecific ST and T wave abnormality at rest Low risk scan __________   2D echo 01/25/2015: - Left ventricle: Systolic function was normal. The estimated    ejection fraction was 50%. Doppler parameters are consistent with    abnormal left ventricular relaxation (grade 1 diastolic    dysfunction).  - Aortic valve: Valve area (Vmax): 2.12 cm^2.  - Mitral valve: There was mild regurgitation.  - Right atrium: The atrium was mildly dilated.  - Atrial septum: No defect or patent foramen ovale was identified.   Impressions:   - Normal wall motion exept mild septal hypokinesis, with    borderrline LVEF and mild diastolic dysfunction.   EKG:  EKG is ordered today.  The EKG ordered today demonstrates ***  Recent Labs: 06/03/2023: ALT 17; BUN 10; Creatinine, Ser 1.04; Hemoglobin 12.3; Platelets 256;  Potassium 4.7; Sodium 140  Recent Lipid Panel    Component Value Date/Time   CHOL 116 06/03/2023 1427   CHOL 132 04/17/2014 0444   TRIG 119 06/03/2023 1427   TRIG 115 04/17/2014 0444   HDL 53 06/03/2023 1427   HDL 37 (L) 04/17/2014 0444   CHOLHDL 2.2 06/03/2023 1427   CHOLHDL 3.4 08/22/2016 1033   VLDL 37 (H) 08/22/2016 1033   VLDL 23 04/17/2014 0444   LDLCALC 42 06/03/2023 1427   LDLCALC 72 04/17/2014 0444   LDLDIRECT 49 06/03/2023 1427    PHYSICAL EXAM:    VS:  LMP 08/26/2012   BMI: There is no height or weight on file to calculate BMI.  Physical Exam  Wt Readings from Last 3 Encounters:  12/17/23 235 lb (106.6 kg)  09/05/23 227 lb (103 kg)  06/03/23 230 lb 3.2 oz (104.4 kg)     ASSESSMENT & PLAN:   Nonobstructive CAD:  HTN: Blood pressure  HLD: LDL 42 in 05/2023 with normal AST/ALT in 09/2023.  Carotid artery disease: Carotid artery ultrasound demonstrated 1 to 39% bilateral ICA stenoses with antegrade flow of the bilateral vertebral arteries and normal flow hemodynamics in the bilateral subclavian arteries in 12/2023.   {Are you ordering a CV Procedure (e.g. stress test, cath, DCCV, TEE, etc)?   Press F2        :[id number]}     Disposition: F/u with Dr. Darron or an APP in ***.   Medication Adjustments/Labs and Tests Ordered:  Current medicines are reviewed at length with the patient today.  Concerns regarding medicines are outlined above. Medication changes, Labs and Tests ordered today are summarized above and listed in the Patient Instructions accessible in Encounters.   Signed, Bernardino Bring, PA-C 12/26/2023 2:33 PM     [facility] [place] [address] [place], [place] [address] [phone]     [1]  No outpatient medications have been marked as taking for the 12/31/23 encounter (Appointment) with Bring Bernardino HERO, PA-C.

## 2023-12-31 ENCOUNTER — Ambulatory Visit: Admitting: Physician Assistant

## 2024-01-31 ENCOUNTER — Ambulatory Visit: Attending: Sleep Medicine

## 2024-01-31 DIAGNOSIS — G478 Other sleep disorders: Secondary | ICD-10-CM | POA: Diagnosis not present

## 2024-01-31 DIAGNOSIS — G4733 Obstructive sleep apnea (adult) (pediatric): Secondary | ICD-10-CM | POA: Insufficient documentation

## 2024-02-11 ENCOUNTER — Encounter

## 2024-02-11 ENCOUNTER — Ambulatory Visit: Admitting: Pulmonary Disease

## 2024-02-14 DIAGNOSIS — G4733 Obstructive sleep apnea (adult) (pediatric): Secondary | ICD-10-CM

## 2024-02-21 NOTE — Telephone Encounter (Signed)
 Noted. Nothing further needed.

## 2024-03-23 ENCOUNTER — Encounter

## 2024-03-26 ENCOUNTER — Ambulatory Visit: Admitting: Pulmonary Disease

## 2024-12-23 ENCOUNTER — Ambulatory Visit
